# Patient Record
Sex: Female | Born: 1950 | Race: Black or African American | Hispanic: No | Marital: Married | Smoking: Never smoker
Health system: Southern US, Community
[De-identification: ages and names within clinical notes are randomized; demographics above are authoritative.]

## PROBLEM LIST (undated history)

## (undated) DIAGNOSIS — M1990 Unspecified osteoarthritis, unspecified site: Secondary | ICD-10-CM

## (undated) DIAGNOSIS — Z972 Presence of dental prosthetic device (complete) (partial): Secondary | ICD-10-CM

## (undated) DIAGNOSIS — N959 Unspecified menopausal and perimenopausal disorder: Secondary | ICD-10-CM

## (undated) DIAGNOSIS — F329 Major depressive disorder, single episode, unspecified: Secondary | ICD-10-CM

## (undated) DIAGNOSIS — E785 Hyperlipidemia, unspecified: Secondary | ICD-10-CM

## (undated) DIAGNOSIS — R7303 Prediabetes: Secondary | ICD-10-CM

## (undated) DIAGNOSIS — I10 Essential (primary) hypertension: Secondary | ICD-10-CM

## (undated) DIAGNOSIS — F32A Depression, unspecified: Secondary | ICD-10-CM

## (undated) DIAGNOSIS — K648 Other hemorrhoids: Secondary | ICD-10-CM

## (undated) DIAGNOSIS — K219 Gastro-esophageal reflux disease without esophagitis: Secondary | ICD-10-CM

## (undated) DIAGNOSIS — T7840XA Allergy, unspecified, initial encounter: Secondary | ICD-10-CM

## (undated) HISTORY — DX: Hyperlipidemia, unspecified: E78.5

## (undated) HISTORY — DX: Allergy, unspecified, initial encounter: T78.40XA

## (undated) HISTORY — DX: Gastro-esophageal reflux disease without esophagitis: K21.9

## (undated) HISTORY — DX: Major depressive disorder, single episode, unspecified: F32.9

## (undated) HISTORY — PX: CATARACT EXTRACTION, BILATERAL: SHX1313

## (undated) HISTORY — DX: Essential (primary) hypertension: I10

## (undated) HISTORY — DX: Depression, unspecified: F32.A

## (undated) HISTORY — PX: MYOMECTOMY: SHX85

## (undated) HISTORY — DX: Other hemorrhoids: K64.8

## (undated) HISTORY — DX: Unspecified osteoarthritis, unspecified site: M19.90

## (undated) HISTORY — DX: Unspecified menopausal and perimenopausal disorder: N95.9

## (undated) HISTORY — PX: ABDOMINAL HYSTERECTOMY: SHX81

## (undated) HISTORY — PX: OOPHORECTOMY: SHX86

---

## 2005-01-31 ENCOUNTER — Ambulatory Visit: Payer: Self-pay

## 2006-02-02 ENCOUNTER — Ambulatory Visit: Payer: Self-pay | Admitting: Family Medicine

## 2006-02-06 ENCOUNTER — Ambulatory Visit: Payer: Self-pay | Admitting: Gastroenterology

## 2006-02-06 LAB — HM COLONOSCOPY

## 2006-11-30 ENCOUNTER — Ambulatory Visit: Payer: Self-pay | Admitting: Obstetrics and Gynecology

## 2006-11-30 HISTORY — PX: OTHER SURGICAL HISTORY: SHX169

## 2006-12-13 ENCOUNTER — Ambulatory Visit: Payer: Self-pay | Admitting: Obstetrics and Gynecology

## 2006-12-21 ENCOUNTER — Ambulatory Visit: Payer: Self-pay | Admitting: Obstetrics and Gynecology

## 2007-02-12 ENCOUNTER — Ambulatory Visit: Payer: Self-pay | Admitting: Family Medicine

## 2008-02-27 ENCOUNTER — Ambulatory Visit: Payer: Self-pay | Admitting: Family Medicine

## 2008-12-21 LAB — HM DEXA SCAN: HM Dexa Scan: NORMAL

## 2009-03-01 ENCOUNTER — Ambulatory Visit: Payer: Self-pay | Admitting: Family Medicine

## 2009-08-31 DIAGNOSIS — R739 Hyperglycemia, unspecified: Secondary | ICD-10-CM

## 2010-03-02 ENCOUNTER — Ambulatory Visit: Payer: Self-pay | Admitting: Family Medicine

## 2011-03-06 ENCOUNTER — Ambulatory Visit: Payer: Self-pay | Admitting: Family Medicine

## 2012-03-06 ENCOUNTER — Ambulatory Visit: Payer: Self-pay | Admitting: Family Medicine

## 2013-03-13 ENCOUNTER — Ambulatory Visit: Payer: Self-pay | Admitting: Family Medicine

## 2014-01-06 LAB — LIPID PANEL
Cholesterol: 120 mg/dL (ref 0–200)
HDL: 54 mg/dL (ref 35–70)
LDL Cholesterol: 41 mg/dL
TRIGLYCERIDES: 126 mg/dL (ref 40–160)

## 2014-01-06 LAB — HEMOGLOBIN A1C: Hgb A1c MFr Bld: 6 % (ref 4.0–6.0)

## 2014-03-25 ENCOUNTER — Ambulatory Visit: Payer: Self-pay | Admitting: Family Medicine

## 2014-03-25 LAB — HM MAMMOGRAPHY: HM Mammogram: NORMAL

## 2014-11-05 ENCOUNTER — Telehealth: Payer: Self-pay

## 2014-11-05 ENCOUNTER — Encounter: Payer: Self-pay | Admitting: Family Medicine

## 2014-11-05 DIAGNOSIS — L309 Dermatitis, unspecified: Secondary | ICD-10-CM | POA: Insufficient documentation

## 2014-11-05 DIAGNOSIS — R32 Unspecified urinary incontinence: Secondary | ICD-10-CM | POA: Insufficient documentation

## 2014-11-05 DIAGNOSIS — I10 Essential (primary) hypertension: Secondary | ICD-10-CM | POA: Insufficient documentation

## 2014-11-05 DIAGNOSIS — N959 Unspecified menopausal and perimenopausal disorder: Secondary | ICD-10-CM | POA: Insufficient documentation

## 2014-11-05 DIAGNOSIS — E559 Vitamin D deficiency, unspecified: Secondary | ICD-10-CM | POA: Insufficient documentation

## 2014-11-05 DIAGNOSIS — F330 Major depressive disorder, recurrent, mild: Secondary | ICD-10-CM | POA: Insufficient documentation

## 2014-11-05 DIAGNOSIS — J302 Other seasonal allergic rhinitis: Secondary | ICD-10-CM | POA: Insufficient documentation

## 2014-11-05 DIAGNOSIS — J3089 Other allergic rhinitis: Secondary | ICD-10-CM

## 2014-11-05 DIAGNOSIS — G5600 Carpal tunnel syndrome, unspecified upper limb: Secondary | ICD-10-CM | POA: Insufficient documentation

## 2014-11-05 DIAGNOSIS — K219 Gastro-esophageal reflux disease without esophagitis: Secondary | ICD-10-CM | POA: Insufficient documentation

## 2014-11-05 DIAGNOSIS — M1990 Unspecified osteoarthritis, unspecified site: Secondary | ICD-10-CM | POA: Insufficient documentation

## 2014-11-05 DIAGNOSIS — L210 Seborrhea capitis: Secondary | ICD-10-CM | POA: Insufficient documentation

## 2014-11-05 DIAGNOSIS — E8881 Metabolic syndrome: Secondary | ICD-10-CM | POA: Insufficient documentation

## 2014-11-05 DIAGNOSIS — M6530 Trigger finger, unspecified finger: Secondary | ICD-10-CM | POA: Insufficient documentation

## 2014-11-05 DIAGNOSIS — E785 Hyperlipidemia, unspecified: Secondary | ICD-10-CM | POA: Insufficient documentation

## 2014-11-05 NOTE — Telephone Encounter (Signed)
Pt called to our call service and left message that she had an appt on Monday 7/11 and was having a lot of pain in her left side and wanted to see if we could see her today? When I got to the message it was already 3:30pm so I called left pt vm to call and make an appt tomorrow(friday, we had avuaibility open)

## 2014-11-06 ENCOUNTER — Encounter: Payer: Self-pay | Admitting: Family Medicine

## 2014-11-06 ENCOUNTER — Ambulatory Visit (INDEPENDENT_AMBULATORY_CARE_PROVIDER_SITE_OTHER): Payer: BC Managed Care – PPO | Admitting: Family Medicine

## 2014-11-06 VITALS — BP 118/74 | HR 74 | Temp 98.7°F | Resp 18 | Ht 62.0 in | Wt 218.0 lb

## 2014-11-06 DIAGNOSIS — M5442 Lumbago with sciatica, left side: Secondary | ICD-10-CM | POA: Diagnosis not present

## 2014-11-06 LAB — POCT URINALYSIS DIPSTICK
Bilirubin, UA: NEGATIVE
Blood, UA: NEGATIVE
Glucose, UA: NEGATIVE
Ketones, UA: NEGATIVE
Nitrite, UA: NEGATIVE
Protein, UA: NEGATIVE
Spec Grav, UA: 1.01
Urobilinogen, UA: 0.2
pH, UA: 5

## 2014-11-06 MED ORDER — HYDROCODONE-ACETAMINOPHEN 5-325 MG PO TABS: 2.0000 | ORAL_TABLET | Freq: Four times a day (QID) | ORAL | Status: DC | PRN

## 2014-11-06 MED ORDER — PREDNISONE 10 MG (48) PO TBPK: ORAL_TABLET | Freq: Every day | ORAL | Status: DC

## 2014-11-06 NOTE — Patient Instructions (Signed)

## 2014-11-06 NOTE — Progress Notes (Signed)
Name: Monique Baker   MRDayton Scrape: [id number]    DOB: 1950-11-27   Date:11/06/2014       Progress Note  Subjective  Chief Complaint  Chief Complaint  Patient presents with  . Back Pain    Onset 4 days ago radiates to left lower quad and leg (unknown trauma or heavy lifiting)    HPI  Acute low back pain with sciatica: she states symptoms started 4 days ago, after she spent the day cleaning her house.  She states pain is described as an aching pain, like a toothache on her back that radiates to left lower quadrant and outer left thigh. Pain can be severe at times and kept her awake last night. She has been taking Aleve, heating pad and topical cream, with temporary relieve of symptoms. No bladder or bowel incontinence, no saddle anesthesia, no weakness , mild antalgic gait.  Pain during movement of her back. Needs to shift positions to get comfortable.    Patient Active Problem List   Diagnosis Date Noted  . Benign essential HTN 11/05/2014  . Carpal tunnel syndrome 11/05/2014  . Osteoarthritis 11/05/2014  . Dermatitis, eczematoid 11/05/2014  . Clinical depression 11/05/2014  . Dyslipidemia 11/05/2014  . Gastro-esophageal reflux disease without esophagitis 11/05/2014  . Extreme obesity 11/05/2014  . Dysmetabolic syndrome 11/05/2014  . Menopausal and perimenopausal disorder 11/05/2014  . Perennial allergic rhinitis with seasonal variation 11/05/2014  . Seborrhea capitis 11/05/2014  . Acquired trigger finger 11/05/2014  . Urinary incontinence in female 11/05/2014  . Vitamin D deficiency 11/05/2014  . Blood glucose elevated 08/31/2009    History  Substance Use Topics  . Smoking status: Never Smoker   . Smokeless tobacco: Never Used  . Alcohol Use: No     Current outpatient prescriptions:  .  acetaminophen (TYLENOL) 500 MG tablet, Take by mouth., Disp: , Rfl:  .  amLODipine (NORVASC) 5 MG tablet, Take by mouth., Disp: , Rfl:  .  atorvastatin (LIPITOR) 40 MG tablet, Take by mouth.,  Disp: , Rfl:  .  Cyanocobalamin (B-12) 1000 MCG SUBL, Place under the tongue., Disp: , Rfl:  .  Glucosamine-Chondroitin ([employer] GLUCOSAMINE-CHONDROITIN) 500-400 MG CAPS, Take by mouth., Disp: , Rfl:  .  meloxicam (MOBIC) 15 MG tablet, Take by mouth., Disp: , Rfl:  .  metFORMIN (GLUCOPHAGE) 500 MG tablet, Take by mouth., Disp: , Rfl:  .  nadolol (CORGARD) 20 MG tablet, Take by mouth., Disp: , Rfl:  .  triamcinolone cream (KENALOG) 0.1 %, TRIAMCINOLONE ACETONIDE, 0.1% (External Cream)  1 Cream apply to nuchal area bid for 30 days  Quantity: 60;  Refills: 2   Ordered :30-Jan-2013  Alba CorySOWLES, Lauranne Beyersdorf MD;  Started 30-Jan-2013 Active, Disp: , Rfl:  .  valsartan-hydrochlorothiazide (DIOVAN-HCT) 320-25 MG per tablet, Take by mouth., Disp: , Rfl:  .  HYDROcodone-acetaminophen (NORCO/VICODIN) 5-325 MG per tablet, Take 2 tablets by mouth every 6 (six) hours as needed for moderate pain., Disp: 20 tablet, Rfl: 0 .  predniSONE (STERAPRED UNI-PAK 48 TAB) 10 MG (48) TBPK tablet, Take by mouth daily. Do not take meloxicam while taking this, Disp: 48 tablet, Rfl: 0  No Known Allergies  ROS  Constitutional: Negative for fever or weight change.  Respiratory: Negative for cough and shortness of breath.   Cardiovascular: Negative for chest pain or palpitations.  Gastrointestinal: Negative for bowel changes.  Musculoskeletal: Negative for gait problem or joint swelling.  Skin: Negative for rash.  Neurological: Negative for dizziness or headache.  No other specific complaints  in a complete review of systems (except as listed in HPI above).  Objective  Filed Vitals:   11/06/14 1027  BP: 118/74  Pulse: 74  Temp: 98.7 F (37.1 C)  TempSrc: Oral  Resp: 18  Height:  (1.575 m)  Weight: 218 lb (98.884 kg)  SpO2: 97%    Body mass index is 39.86 kg/(m^2).    Physical Exam  Constitutional: Patient appears well-developed and well-nourished. No distress. Obese Eyes:  No scleral icterus. PERL Neck: Normal  range of motion. Neck supple. Cardiovascular: Normal rate, regular rhythm and normal heart sounds.  No murmur heard. No BLE edema. Pulmonary/Chest: Effort normal and breath sounds normal. No respiratory distress. Abdominal: Soft.  There is no tenderness. Psychiatric: Patient has a normal mood and affect. behavior is normal. Judgment and thought content normal. Muscular Skeletal/Neuro: neg straight leg raise, patella reflexes normal bilaterally, normal flexion of spine, pain with extension on left lateral bending. Normal sensation lower extremities, normal rom of both hips  Recent Results (from the past 2160 hour(s))  POCT urinalysis dipstick     Status: Abnormal   Collection Time: 11/06/14 10:46 AM  Result Value Ref Range   Color, UA yeallow    Clarity, UA clear    Glucose, UA neg    Bilirubin, UA neg    Ketones, UA neg    Spec Grav, UA 1.010    Blood, UA neg    pH, UA 5.0    Protein, UA neg    Urobilinogen, UA 0.2    Nitrite, UA neg    Leukocytes, UA large (3+) (A) Negative   Depression screen [facility] 2/9 11/06/2014  Decreased Interest 0  Down, Depressed, Hopeless 0  PHQ - 2 Score 0    Fall Risk: Fall Risk  11/06/2014  Falls in the past year? No       Assessment & Plan  1. Left-sided low back pain with left-sided sciatica We will try prednisone taper and hydrocodone, avoid nsaid's while taking prednisone ( also explained that Aleve/Motrin/Meloxicam are the same class of drugs and should not be taken with Meloxicam or prednisone).  - POCT urinalysis dipstick - predniSONE (STERAPRED UNI-PAK 48 TAB) 10 MG (48) TBPK tablet; Take by mouth daily. Do not take meloxicam while taking this  Dispense: 48 tablet; Refill: 0 - HYDROcodone-acetaminophen (NORCO/VICODIN) 5-325 MG per tablet; Take 2 tablets by mouth every 6 (six) hours as needed for moderate pain.  Dispense: 20 tablet; Refill: 0

## 2014-11-09 ENCOUNTER — Telehealth: Payer: Self-pay | Admitting: Family Medicine

## 2014-11-09 ENCOUNTER — Encounter: Payer: Self-pay | Admitting: Family Medicine

## 2014-11-09 ENCOUNTER — Ambulatory Visit (INDEPENDENT_AMBULATORY_CARE_PROVIDER_SITE_OTHER): Payer: BLUE CROSS/BLUE SHIELD | Admitting: Family Medicine

## 2014-11-09 VITALS — BP 136/84 | HR 69 | Temp 98.3°F | Resp 16 | Ht 62.0 in | Wt 219.8 lb

## 2014-11-09 DIAGNOSIS — I10 Essential (primary) hypertension: Secondary | ICD-10-CM

## 2014-11-09 DIAGNOSIS — E785 Hyperlipidemia, unspecified: Secondary | ICD-10-CM

## 2014-11-09 DIAGNOSIS — R739 Hyperglycemia, unspecified: Secondary | ICD-10-CM | POA: Diagnosis not present

## 2014-11-09 DIAGNOSIS — E538 Deficiency of other specified B group vitamins: Secondary | ICD-10-CM | POA: Diagnosis not present

## 2014-11-09 DIAGNOSIS — M5442 Lumbago with sciatica, left side: Secondary | ICD-10-CM | POA: Diagnosis not present

## 2014-11-09 MED ORDER — HYDROCODONE-ACETAMINOPHEN 5-325 MG PO TABS: 2.0000 | ORAL_TABLET | Freq: Four times a day (QID) | ORAL | Status: DC | PRN

## 2014-11-09 MED ORDER — AMLODIPINE-VALSARTAN-HCTZ 10-160-12.5 MG PO TABS: 1.0000 | ORAL_TABLET | Freq: Every day | ORAL | Status: DC

## 2014-11-09 MED ORDER — AMLODIPINE-VALSARTAN-HCTZ 5-160-12.5 MG PO TABS: 1.0000 | ORAL_TABLET | Freq: Every day | ORAL | Status: DC

## 2014-11-09 NOTE — Progress Notes (Signed)
Name: Monique Baker   MRN: [id number]    DOB: 08-18-1950   Date:11/09/2014       Progress Note  Subjective  Chief Complaint  Chief Complaint  Patient presents with  . Hypertension  . Hyperlipidemia    HPI  HTN: taking medication, takes half dose of valsarta-HCTZ, also takes Amlodipine daily, not side effects, denies chest pain or SOB, no edema.  Hyperlipidemia: taking Atorvastatin and denies side effects, she is compliant with medication   Acute low back pain with radiculitis: doing well, started prednisone last week and pain is down to 3/10, still mild down going down left thigh, but improved. Denies side effects of prednisone, still taking hydrocodone prn and would like a refill.   Prediabetes: she is not exercising now, because of back pain, but tries to walk on a regular basis.   Patient Active Problem List   Diagnosis Date Noted  . Benign essential HTN 11/05/2014  . Carpal tunnel syndrome 11/05/2014  . Osteoarthritis 11/05/2014  . Dermatitis, eczematoid 11/05/2014  . Clinical depression 11/05/2014  . Dyslipidemia 11/05/2014  . Gastro-esophageal reflux disease without esophagitis 11/05/2014  . Extreme obesity 11/05/2014  . Dysmetabolic syndrome 11/05/2014  . Menopausal and perimenopausal disorder 11/05/2014  . Perennial allergic rhinitis with seasonal variation 11/05/2014  . Seborrhea capitis 11/05/2014  . Acquired trigger finger 11/05/2014  . Urinary incontinence in female 11/05/2014  . Vitamin D deficiency 11/05/2014  . Blood glucose elevated 08/31/2009    Past Surgical History  Procedure Laterality Date  . Vulva cyst  11/2006  . Abdominal hysterectomy    . Cesarean section    . Myomectomy    . Trigger thumb Right     Family History  Problem Relation Age of Onset  . Dementia Mother   . Diabetes Father   . Cancer Daughter     Fibroid Tumors    History   Social History  . Marital Status: Married    Spouse Name: N/A  . Number of Children: N/A  .  Years of Education: N/A   Occupational History  . Not on file.   Social History Main Topics  . Smoking status: Never Smoker   . Smokeless tobacco: Never Used  . Alcohol Use: No  . Drug Use: No  . Sexual Activity:    Partners: Male   Other Topics Concern  . Not on file   Social History Narrative     Current outpatient prescriptions:  .  acetaminophen (TYLENOL) 500 MG tablet, Take by mouth., Disp: , Rfl:  .  amLODipine (NORVASC) 5 MG tablet, Take by mouth., Disp: , Rfl:  .  atorvastatin (LIPITOR) 40 MG tablet, Take by mouth., Disp: , Rfl:  .  Cyanocobalamin (B-12) 1000 MCG SUBL, Place under the tongue., Disp: , Rfl:  .  Glucosamine-Chondroitin ([employer] GLUCOSAMINE-CHONDROITIN) 500-400 MG CAPS, Take by mouth., Disp: , Rfl:  .  HYDROcodone-acetaminophen (NORCO/VICODIN) 5-325 MG per tablet, Take 2 tablets by mouth every 6 (six) hours as needed for moderate pain., Disp: 20 tablet, Rfl: 0 .  meloxicam (MOBIC) 15 MG tablet, Take by mouth., Disp: , Rfl:  .  metFORMIN (GLUCOPHAGE) 500 MG tablet, Take by mouth., Disp: , Rfl:  .  nadolol (CORGARD) 20 MG tablet, Take by mouth., Disp: , Rfl:  .  predniSONE (STERAPRED UNI-PAK 48 TAB) 10 MG (48) TBPK tablet, Take by mouth daily. Do not take meloxicam while taking this, Disp: 48 tablet, Rfl: 0 .  triamcinolone cream (KENALOG) 0.1 %, TRIAMCINOLONE ACETONIDE,  0.1% (External Cream)  1 Cream apply to nuchal area bid for 30 days  Quantity: 60;  Refills: 2   Ordered :30-Jan-2013  Alba Cory MD;  Started 30-Jan-2013 Active, Disp: , Rfl:  .  valsartan-hydrochlorothiazide (DIOVAN-HCT) 320-25 MG per tablet, Take by mouth., Disp: , Rfl:   No Known Allergies   ROS  Constitutional: Negative for fever or weight change.  Respiratory: Negative for cough and shortness of breath.   Cardiovascular: Negative for chest pain or palpitations.  Gastrointestinal: Negative for abdominal pain, no bowel changes, except for a little constipation, since she has not been  walking as much because of back pain.  Musculoskeletal: Negative for gait problem or joint swelling.  Skin: Negative for rash.  Neurological: Negative for dizziness or headache.  No other specific complaints in a complete review of systems (except as listed in HPI above). Objective  Filed Vitals:   11/09/14 0920  BP: 136/84  Pulse: 69  Temp: 98.3 F (36.8 C)  TempSrc: Oral  Resp: 16  Height:  (1.575 m)  Weight: 219 lb 12.8 oz (99.701 kg)  SpO2: 96%    Body mass index is 40.19 kg/(m^2).  Physical Exam  Constitutional: Patient appears well-developed and well-nourished. No distress.  Eyes:  No scleral icterus.  Neck: Normal range of motion. Neck supple. Cardiovascular: Normal rate, regular rhythm and normal heart sounds.  No murmur heard. No BLE edema. Pulmonary/Chest: Effort normal and breath sounds normal. No respiratory distress. Abdominal: Soft.  There is no tenderness. Psychiatric: Patient has a normal mood and affect. behavior is normal. Judgment and thought content normal. Muscular Skeletal : no pain during palpation of lumbar spine, neg straight leg raise - grinding with left knee extension  Recent Results (from the past 2160 hour(s))  POCT urinalysis dipstick     Status: Abnormal   Collection Time: 11/06/14 10:46 AM  Result Value Ref Range   Color, UA yeallow    Clarity, UA clear    Glucose, UA neg    Bilirubin, UA neg    Ketones, UA neg    Spec Grav, UA 1.010    Blood, UA neg    pH, UA 5.0    Protein, UA neg    Urobilinogen, UA 0.2    Nitrite, UA neg    Leukocytes, UA large (3+) (A) Negative    PHQ2/9: Depression screen [facility] 2/9 11/06/2014  Decreased Interest 0  Down, Depressed, Hopeless 0  PHQ - 2 Score 0     Fall Risk: Fall Risk  11/06/2014  Falls in the past year? No    Assessment & Plan  1. Benign essential HTN  - Amlodipine-Valsartan-HCTZ 5-160-12.5 MG TABS; Take 1 tablet by mouth daily.  Dispense: 90 tablet; Refill: 1 - Comprehensive  Metabolic Panel (CMET)  2. Dyslipidemia  - Lipid Profile   3. Left-sided low back pain with left-sided sciatica  - HYDROcodone-acetaminophen (NORCO/VICODIN) 5-325 MG per tablet; Take 2 tablets by mouth every 6 (six) hours as needed for moderate pain.  Dispense: 20 tablet; Refill: 0  4. Hyperglycemia  Continue Metformin and resume physical activity when back is better - HgB A1c  5. B12 deficiency  - Vitamin B12

## 2014-11-09 NOTE — Telephone Encounter (Signed)
Please call pharmacy to cancel order of Exforge 10/160/12.5 . Thank you

## 2014-11-10 LAB — COMPREHENSIVE METABOLIC PANEL
ALK PHOS: 76 IU/L (ref 39–117)
ALT: 17 IU/L (ref 0–32)
AST: 17 IU/L (ref 0–40)
Albumin/Globulin Ratio: 1.6 (ref 1.1–2.5)
Albumin: 4.6 g/dL (ref 3.6–4.8)
BUN / CREAT RATIO: 25 (ref 11–26)
BUN: 20 mg/dL (ref 8–27)
Bilirubin Total: 0.6 mg/dL (ref 0.0–1.2)
CO2: 27 mmol/L (ref 18–29)
Calcium: 10.2 mg/dL (ref 8.7–10.3)
Chloride: 99 mmol/L (ref 97–108)
Creatinine, Ser: 0.79 mg/dL (ref 0.57–1.00)
GFR calc Af Amer: 92 mL/min/{1.73_m2} (ref >59)
GFR calc non Af Amer: 80 mL/min/{1.73_m2} (ref >59)
GLOBULIN, TOTAL: 2.8 g/dL (ref 1.5–4.5)
Glucose: 98 mg/dL (ref 65–99)
Potassium: 4.4 mmol/L (ref 3.5–5.2)
SODIUM: 142 mmol/L (ref 134–144)
TOTAL PROTEIN: 7.4 g/dL (ref 6.0–8.5)

## 2014-11-10 LAB — HEMOGLOBIN A1C
Est. average glucose Bld gHb Est-mCnc: 128 mg/dL
HEMOGLOBIN A1C: 6.1 % — AB (ref 4.8–5.6)

## 2014-11-10 LAB — LIPID PANEL
Chol/HDL Ratio: 2.2 ratio units (ref 0.0–4.4)
Cholesterol, Total: 143 mg/dL (ref 100–199)
HDL: 64 mg/dL (ref >39)
LDL Calculated: 63 mg/dL (ref 0–99)
TRIGLYCERIDES: 82 mg/dL (ref 0–149)
VLDL Cholesterol Cal: 16 mg/dL (ref 5–40)

## 2014-11-10 LAB — VITAMIN B12: Vitamin B-12: 1548 pg/mL — ABNORMAL HIGH (ref 211–946)

## 2014-11-11 NOTE — Progress Notes (Signed)
Patient notified

## 2014-12-15 HISTORY — PX: OTHER SURGICAL HISTORY: SHX169

## 2014-12-28 ENCOUNTER — Encounter: Payer: Self-pay | Admitting: Family Medicine

## 2015-01-11 ENCOUNTER — Encounter: Payer: Self-pay | Admitting: Family Medicine

## 2015-01-11 ENCOUNTER — Ambulatory Visit
Admission: RE | Admit: 2015-01-11 | Discharge: 2015-01-11 | Disposition: A | Discharge status: Home / Self Care | Payer: BC Managed Care – PPO | Source: Ambulatory Visit | Attending: Family Medicine | Admitting: Family Medicine

## 2015-01-11 ENCOUNTER — Ambulatory Visit (INDEPENDENT_AMBULATORY_CARE_PROVIDER_SITE_OTHER): Payer: BC Managed Care – PPO | Admitting: Family Medicine

## 2015-01-11 VITALS — BP 134/82 | HR 78 | Temp 98.8°F | Resp 16 | Ht 62.0 in | Wt 219.1 lb

## 2015-01-11 DIAGNOSIS — M2041 Other hammer toe(s) (acquired), right foot: Secondary | ICD-10-CM

## 2015-01-11 DIAGNOSIS — M25551 Pain in right hip: Secondary | ICD-10-CM

## 2015-01-11 DIAGNOSIS — Z23 Encounter for immunization: Secondary | ICD-10-CM | POA: Diagnosis not present

## 2015-01-11 DIAGNOSIS — R05 Cough: Secondary | ICD-10-CM

## 2015-01-11 DIAGNOSIS — R059 Cough, unspecified: Secondary | ICD-10-CM

## 2015-01-11 DIAGNOSIS — Z0000 Encounter for general adult medical examination without abnormal findings: Secondary | ICD-10-CM | POA: Diagnosis not present

## 2015-01-11 DIAGNOSIS — M2042 Other hammer toe(s) (acquired), left foot: Secondary | ICD-10-CM

## 2015-01-11 DIAGNOSIS — J3089 Other allergic rhinitis: Secondary | ICD-10-CM

## 2015-01-11 DIAGNOSIS — M6530 Trigger finger, unspecified finger: Secondary | ICD-10-CM

## 2015-01-11 DIAGNOSIS — R5383 Other fatigue: Secondary | ICD-10-CM | POA: Diagnosis not present

## 2015-01-11 DIAGNOSIS — J302 Other seasonal allergic rhinitis: Secondary | ICD-10-CM

## 2015-01-11 DIAGNOSIS — J309 Allergic rhinitis, unspecified: Secondary | ICD-10-CM | POA: Diagnosis not present

## 2015-01-11 DIAGNOSIS — Z01419 Encounter for gynecological examination (general) (routine) without abnormal findings: Secondary | ICD-10-CM

## 2015-01-11 DIAGNOSIS — Z1239 Encounter for other screening for malignant neoplasm of breast: Secondary | ICD-10-CM

## 2015-01-11 MED ORDER — BENZONATATE 100 MG PO CAPS: 100.0000 mg | ORAL_CAPSULE | Freq: Two times a day (BID) | ORAL | Status: DC | PRN

## 2015-01-11 MED ORDER — LORATADINE 10 MG PO TABS: 10.0000 mg | ORAL_TABLET | Freq: Every day | ORAL | Status: DC

## 2015-01-11 MED ORDER — BENZONATATE 100 MG PO CAPS: 100.0000 mg | ORAL_CAPSULE | Freq: Three times a day (TID) | ORAL | Status: DC

## 2015-01-11 MED ORDER — FLUTICASONE PROPIONATE 50 MCG/ACT NA SUSP: 2.0000 | Freq: Every day | NASAL | Status: DC

## 2015-01-11 NOTE — Progress Notes (Signed)
Name: Monique Baker   MRN: [id number]    DOB: 04-19-1951   Date:01/11/2015       Progress Note  Subjective  Chief Complaint  Chief Complaint  Patient presents with  . Annual Exam  . Hip Pain    right side pt states painful and limping, onset 1 year  . Fatigue    no energy onset for several months ( still watching grandson)  . Toe Pain    left foot second toe painful onset 3-weeks  . 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  Colonoscopy due next year, mammogram up to date, and pap smear not needed, s/p hysterectomy  Toe Pain: second toe left foot, has a hammer toe that is very tender during extension, also has pain on metatarsal phalangeal joint, symptoms going on for about 2 months, she has not been taking Meloxicam, advised to resume medication with food daily and call back for referral to Podiatrist if no improvement  Pain right groin: she has noticed pain on right groin for the past year, getting worse, pain getting up, sometimes radiates to right knee. Pain is described as dull, aching, daily, at this time is 5/10. Pain is aggravated by movement, and when trying to get up from a chair.   Cough: symptoms started one week, no cold symptoms, but has she has allergies. Cough is dry, sometimes when she is talking. No fever, no wheezing, no SOB. She has Perennial Allergies with seasonal variation and has been out of her medications for months.   Patient Active Problem List   Diagnosis Date Noted  . Benign essential HTN 11/05/2014  . Carpal tunnel syndrome 11/05/2014  . Osteoarthritis 11/05/2014  . Dermatitis, eczematoid 11/05/2014  . Clinical depression 11/05/2014  . Dyslipidemia 11/05/2014  . Gastro-esophageal reflux disease without esophagitis 11/05/2014  . Extreme obesity 11/05/2014  . Dysmetabolic  syndrome 11/05/2014  . Menopausal and perimenopausal disorder 11/05/2014  . Perennial allergic rhinitis with seasonal variation 11/05/2014  . Seborrhea capitis 11/05/2014  . Acquired trigger finger 11/05/2014  . Urinary incontinence in female 11/05/2014  . Vitamin D deficiency 11/05/2014  . Blood glucose elevated 08/31/2009    Past Surgical History  Procedure Laterality Date  . Vulva cyst  11/2006  . Abdominal hysterectomy    . Cesarean section    . Myomectomy    . Trigger finger Right 12/15/2014    Family History  Problem Relation Age of Onset  . Dementia Mother   . Diabetes Father   . Cancer Daughter     Fibroid Tumors    Social History   Social History  . Marital Status: Married    Spouse Name: N/A  . Number of Children: N/A  . Years of Education: N/A   Occupational History  . Not on file.   Social History Main Topics  . Smoking status: Never Smoker   . Smokeless tobacco: Never Used  . Alcohol Use: No  . Drug Use: No  . Sexual Activity:    Partners: Male   Other Topics Concern  . Not on file   Social History Narrative     Current outpatient prescriptions:  .  acetaminophen (TYLENOL) 500 MG tablet, Take by mouth., Disp: , Rfl:  .  Amlodipine-Valsartan-HCTZ 5-160-12.5 MG TABS, Take  1 tablet by mouth daily., Disp: 90 tablet, Rfl: 1 .  atorvastatin (LIPITOR) 40 MG tablet, Take by mouth., Disp: , Rfl:  .  benzonatate (TESSALON) 100 MG capsule, Take 1 capsule (100 mg total) by mouth 2 (two) times daily as needed for cough., Disp: 20 capsule, Rfl: 0 .  Cyanocobalamin (B-12) 1000 MCG SUBL, Place under the tongue., Disp: , Rfl:  .  fluticasone (FLONASE) 50 MCG/ACT nasal spray, Place 2 sprays into both nostrils daily., Disp: 48 g, Rfl: 0 .  Glucosamine-Chondroitin ([employer] GLUCOSAMINE-CHONDROITIN) 500-400 MG CAPS, Take by mouth., Disp: , Rfl:  .  loratadine (CLARITIN) 10 MG tablet, Take 1 tablet (10 mg total) by mouth daily., Disp: 90 tablet, Rfl: 1 .  meloxicam  (MOBIC) 15 MG tablet, Take by mouth., Disp: , Rfl:  .  metFORMIN (GLUCOPHAGE) 500 MG tablet, Take by mouth., Disp: , Rfl:  .  nadolol (CORGARD) 20 MG tablet, Take by mouth., Disp: , Rfl:  .  traMADol (ULTRAM) 50 MG tablet, Take 50 mg by mouth every 6 (six) hours as needed. for pain, Disp: , Rfl: 0 .  triamcinolone cream (KENALOG) 0.1 %, TRIAMCINOLONE ACETONIDE, 0.1% (External Cream)  1 Cream apply to nuchal area bid for 30 days  Quantity: 60;  Refills: 2   Ordered :30-Jan-2013  Alba Cory MD;  Started 30-Jan-2013 Active, Disp: , Rfl:   No Known Allergies   ROS  Constitutional: Negative for fever or weight change.  Respiratory: Positive  for cough no  shortness of breath.   Cardiovascular: Negative for chest pain or palpitations.  Gastrointestinal: Negative for abdominal pain, no bowel changes.  Musculoskeletal:Positive  for gait problem or joint swelling.  Skin: Negative for rash.  Neurological: Negative for dizziness or headache.  No other specific complaints in a complete review of systems (except as listed in HPI above).  Objective  Filed Vitals:   01/11/15 0824  BP: 134/82  Pulse: 78  Temp: 98.8 F (37.1 C)  TempSrc: Oral  Resp: 16  Height: 5\' 2"  (1.575 m)  Weight: 219 lb 1.6 oz (99.383 kg)  SpO2: 97%    Body mass index is 40.06 kg/(m^2).  Physical Exam  Constitutional: Patient appears well-developed and obese.No distress.  HENT: Head: Normocephalic and atraumatic. Ears: B TMs ok, no erythema or effusion; Nose: Nose normal. Mouth/Throat: Oropharynx is clear and moist. No oropharyngeal exudate.  Eyes: Conjunctivae and EOM are normal. Pupils are equal, round, and reactive to light. No scleral icterus.  Neck: Normal range of motion. Neck supple. No JVD present. No thyromegaly present.  Cardiovascular: Normal rate, regular rhythm and normal heart sounds.  No murmur heard. No BLE edema. Pulmonary/Chest: Effort normal and breath sounds normal. No respiratory  distress. Abdominal: Soft. Bowel sounds are normal, no distension. There is no tenderness. no masses Breast: no lumps or masses, no nipple discharge or rashes FEMALE GENITALIA:  External genitalia normal, she has a rectocele External urethra normal RECTAL: not done Musculoskeletal: Normal range of motion, no joint effusions. No gross deformities Pain during internal rotation of right hip, hammer toes both second toes, but tender with extension of left second toe, also has pain on metatarsal phalangeal joint of left foot. No pain during back exam Neurological: he is alert and oriented to person, place, and time. No cranial nerve deficit. Coordination, balance, strength, speech and gait are normal.  Skin: Skin is warm and dry. No rash noted. No erythema.  Psychiatric: Patient has a normal mood and affect. behavior is normal.  Judgment and thought content normal.   Recent Results (from the past 2160 hour(s))  POCT urinalysis dipstick     Status: Abnormal   Collection Time: 11/06/14 10:46 AM  Result Value Ref Range   Color, UA yeallow    Clarity, UA clear    Glucose, UA neg    Bilirubin, UA neg    Ketones, UA neg    Spec Grav, UA 1.010    Blood, UA neg    pH, UA 5.0    Protein, UA neg    Urobilinogen, UA 0.2    Nitrite, UA neg    Leukocytes, UA large (3+) (A) Negative  HgB A1c     Status: Abnormal   Collection Time: 11/09/14 10:11 AM  Result Value Ref Range   Hgb A1c MFr Bld 6.1 (H) 4.8 - 5.6 %    Comment:          Pre-diabetes: 5.7 - 6.4          Diabetes: >6.4          Glycemic control for adults with diabetes: <7.0    Est. average glucose Bld gHb Est-mCnc 128 mg/dL  Lipid Profile     Status: None   Collection Time: 11/09/14 10:11 AM  Result Value Ref Range   Cholesterol, Total 143 100 - 199 mg/dL   Triglycerides 82 0 - 149 mg/dL   HDL 64 >16 mg/dL    Comment: According to ATP-III Guidelines, HDL-C >59 mg/dL is considered a negative risk factor for CHD.    VLDL Cholesterol  Cal 16 5 - 40 mg/dL   LDL Calculated 63 0 - 99 mg/dL   Chol/HDL Ratio 2.2 0.0 - 4.4 ratio units    Comment:                                   T. Chol/HDL Ratio                                             Men  Women                               1/2 Avg.Risk  3.4    3.3                                   Avg.Risk  5.0    4.4                                2X Avg.Risk  9.6    7.1                                3X Avg.Risk 23.4   11.0   Comprehensive Metabolic Panel (CMET)     Status: None   Collection Time: 11/09/14 10:11 AM  Result Value Ref Range   Glucose 98 65 - 99 mg/dL   BUN 20 8 - 27 mg/dL   Creatinine, Ser 1.09 0.57 - 1.00 mg/dL   GFR calc non Af Amer 80 >59 mL/min/1.73   GFR calc Af  Amer 92 >59 mL/min/1.73   BUN/Creatinine Ratio 25 11 - 26   Sodium 142 134 - 144 mmol/L   Potassium 4.4 3.5 - 5.2 mmol/L   Chloride 99 97 - 108 mmol/L   CO2 27 18 - 29 mmol/L   Calcium 10.2 8.7 - 10.3 mg/dL   Total Protein 7.4 6.0 - 8.5 g/dL   Albumin 4.6 3.6 - 4.8 g/dL   Globulin, Total 2.8 1.5 - 4.5 g/dL   Albumin/Globulin Ratio 1.6 1.1 - 2.5   Bilirubin Total 0.6 0.0 - 1.2 mg/dL   Alkaline Phosphatase 76 39 - 117 IU/L   AST 17 0 - 40 IU/L   ALT 17 0 - 32 IU/L  Vitamin B12     Status: Abnormal   Collection Time: 11/09/14 10:11 AM  Result Value Ref Range   Vitamin B-12 1548 (H) 211 - 946 pg/mL     PHQ2/9: Depression screen [facility] 2/9 01/11/2015 11/06/2014  Decreased Interest 0 0  Down, Depressed, Hopeless 1 0  PHQ - 2 Score 1 0     Fall Risk: Fall Risk  01/11/2015 11/06/2014  Falls in the past year? No No     Functional Status Survey: Is the patient deaf or have difficulty hearing?: No Does the patient have difficulty seeing, even when wearing glasses/contacts?: Yes (glasses) Does the patient have difficulty concentrating, remembering, or making decisions?: No Does the patient have difficulty walking or climbing stairs?: No Does the patient have difficulty dressing or bathing?:  No Does the patient have difficulty doing errands alone such as visiting a doctor's office or shopping?: No    Assessment & Plan  1. Well woman exam Discussed importance of 150 minutes of physical activity weekly, eat two servings of fish weekly, eat one serving of tree nuts ( cashews, pistachios, pecans, almonds.Marland Kitchen) every other day, eat 6 servings of fruit/vegetables daily and drink plenty of water and avoid sweet beverages.   2. Needs flu shot  - Flu Vaccine QUAD 36+ mos PF IM (Fluarix & Fluzone Quad PF)  3. Right hip pain Take Meloxican, get x-ray - if it shows Osteoarthritis we will refer her to PT , and if not better refer to Ortho - seen by Dr. Hyacinth Meeker in the past for other Ortho problems - DG Arthro Hip Right; Future  4. 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 Cough Likely allergy related - benzonatate (TESSALON) 100 MG capsule; Take 1 capsule (100 mg total) by mouth 2 (two) times daily as needed for cough.  Dispense: 20 capsule; Refill: 0  7. Perennial allergic rhinitis with seasonal variation Resume medication  - fluticasone (FLONASE) 50 MCG/ACT nasal spray; Place 2 sprays into both nostrils daily.  Dispense: 48 g; Refill: 0 - loratadine (CLARITIN) 10 MG tablet; Take 1 tablet (10 mg total) by mouth daily.  Dispense: 90 tablet; Refill: 1  8. Breast cancer screening  - MM Digital Screening; Future

## 2015-01-11 NOTE — Patient Instructions (Signed)
  Monique Baker , Thank you for taking time to come for your Wellness Visit. I appreciate your ongoing commitment to your health goals. Please review the following plan we discussed and let me know if I can assist you in the future.   These are the goals we discussed: - start walking for 30 minutes five days week - focus on losing weight to improve hip pain   This is a list of the screening recommended for you and due dates:  Health Maintenance  Topic Date Due  . HIV Screening  05/01/2019*  . Flu Shot  11/30/2015  . Colon Cancer Screening  02/07/2016  . Mammogram  03/25/2016  . Pap Smear  11/05/2017  . Tetanus Vaccine  05/10/2020  . Shingles Vaccine  Completed  .  Hepatitis C: One time screening is recommended by Center for Disease Control  (CDC) for  adults born from [birth year] through 1965.   Completed  *Topic was postponed. The date shown is not the original due date.

## 2015-01-12 ENCOUNTER — Telehealth: Payer: Self-pay

## 2015-01-12 DIAGNOSIS — R9389 Abnormal findings on diagnostic imaging of other specified body structures: Secondary | ICD-10-CM

## 2015-01-12 DIAGNOSIS — M25551 Pain in right hip: Secondary | ICD-10-CM

## 2015-01-12 NOTE — Telephone Encounter (Signed)
Pt would like to go ahead and get referral to Ortho Dr. Hyacinth Meeker

## 2015-01-19 DIAGNOSIS — M47816 Spondylosis without myelopathy or radiculopathy, lumbar region: Secondary | ICD-10-CM | POA: Insufficient documentation

## 2015-02-10 ENCOUNTER — Other Ambulatory Visit: Payer: Self-pay | Admitting: Family Medicine

## 2015-02-19 ENCOUNTER — Telehealth: Payer: Self-pay | Admitting: Family Medicine

## 2015-02-19 NOTE — Telephone Encounter (Signed)
Patient notified of what she can take by phone at 02/19/15 at 12:01 p.m. TJ

## 2015-02-19 NOTE — Telephone Encounter (Signed)
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 States Dr Carlynn PurlSowles had told her not to take a certain medication while taking the Meloxicam but is not able to remember which one. Please return patient call to discuss this

## 2015-02-19 NOTE — Telephone Encounter (Signed)
She can't take otc Aleve, Motrin, or other nsaid's.  She can take Tramadol and Tylenol with Meloxicam

## 2015-03-15 ENCOUNTER — Encounter: Payer: Self-pay | Admitting: Family Medicine

## 2015-03-15 ENCOUNTER — Ambulatory Visit (INDEPENDENT_AMBULATORY_CARE_PROVIDER_SITE_OTHER): Payer: BC Managed Care – PPO | Admitting: Family Medicine

## 2015-03-15 VITALS — BP 132/86 | HR 80 | Temp 98.2°F | Resp 16 | Ht 62.0 in | Wt 220.2 lb

## 2015-03-15 DIAGNOSIS — E8881 Metabolic syndrome: Secondary | ICD-10-CM | POA: Diagnosis not present

## 2015-03-15 DIAGNOSIS — M1612 Unilateral primary osteoarthritis, left hip: Secondary | ICD-10-CM | POA: Diagnosis not present

## 2015-03-15 DIAGNOSIS — E785 Hyperlipidemia, unspecified: Secondary | ICD-10-CM

## 2015-03-15 DIAGNOSIS — M2042 Other hammer toe(s) (acquired), left foot: Secondary | ICD-10-CM

## 2015-03-15 DIAGNOSIS — Z9889 Other specified postprocedural states: Secondary | ICD-10-CM

## 2015-03-15 DIAGNOSIS — Z8739 Personal history of other diseases of the musculoskeletal system and connective tissue: Secondary | ICD-10-CM | POA: Insufficient documentation

## 2015-03-15 DIAGNOSIS — M2041 Other hammer toe(s) (acquired), right foot: Secondary | ICD-10-CM | POA: Diagnosis not present

## 2015-03-15 DIAGNOSIS — I10 Essential (primary) hypertension: Secondary | ICD-10-CM | POA: Diagnosis not present

## 2015-03-15 LAB — POCT UA - MICROALBUMIN: Microalbumin Ur, POC: 20 mg/L

## 2015-03-15 LAB — POCT GLYCOSYLATED HEMOGLOBIN (HGB A1C): Hemoglobin A1C: 5.8

## 2015-03-15 MED ORDER — AMLODIPINE-VALSARTAN-HCTZ 5-160-12.5 MG PO TABS: 1.0000 | ORAL_TABLET | Freq: Every day | ORAL | Status: DC

## 2015-03-15 MED ORDER — TRAMADOL HCL 50 MG PO TABS: 50.0000 mg | ORAL_TABLET | Freq: Four times a day (QID) | ORAL | Status: DC | PRN

## 2015-03-15 NOTE — Progress Notes (Signed)
Name: Monique Baker   MRN: [id number]    DOB: 11/14/1950   Date:03/15/2015       Progress Note  Subjective  Chief Complaint  Chief Complaint  Patient presents with  . Medication Management    4 month F/U  . Hyperglycemia    check BG 1x every 2 weeks. Low-98, High-114  . Hypertension    HPI  Prediabetes/metabolic syndrome: hgbA1C is at goal, taking Metformin, denies polyphagia, polyuria or polydipsia.   HTN: taking bp medication, denies side effects, no chest pain, no palpitation.   Hammer toe: she continues to have pain on left second toe, she would like to see podiatrist.  Pain is aching like.   Right hip pain: seen by Dr. Hyacinth MeekerMiller, taking Meloxicam and is doing better, taking Tramadol prn, pain is described as a pinching pain that goes from knee to right groin.   Obesity: weight is stable, she is frustrated but [facility] and also occasional soda, eats out frequently but trying to order better meals.   Patient Active Problem List   Diagnosis Date Noted  . Hammer toe of second toe of left foot 03/15/2015  . Benign essential HTN 11/05/2014  . Carpal tunnel syndrome 11/05/2014  . Osteoarthritis 11/05/2014  . Dermatitis, eczematoid 11/05/2014  . Clinical depression 11/05/2014  . Dyslipidemia 11/05/2014  . Gastro-esophageal reflux disease without esophagitis 11/05/2014  . Extreme obesity (HCC) 11/05/2014  . Dysmetabolic syndrome 11/05/2014  . Menopausal and perimenopausal disorder 11/05/2014  . Perennial allergic rhinitis with seasonal variation 11/05/2014  . Seborrhea capitis 11/05/2014  . Acquired trigger finger 11/05/2014  . Urinary incontinence in female 11/05/2014  . Vitamin D deficiency 11/05/2014  . Blood glucose elevated 08/31/2009    Past Surgical History  Procedure Laterality Date  . Vulva cyst  11/2006  . Abdominal hysterectomy    . Cesarean section    . Myomectomy    . Trigger finger Right 12/15/2014    Family History  Problem Relation  Age of Onset  . Dementia Mother   . Diabetes Father   . Cancer Daughter     Fibroid Tumors    Social History   Social History  . Marital Status: Married    Spouse Name: N/A  . Number of Children: N/A  . Years of Education: N/A   Occupational History  . Not on file.   Social History Main Topics  . Smoking status: Never Smoker   . Smokeless tobacco: Never Used  . Alcohol Use: No  . Drug Use: No  . Sexual Activity:    Partners: Male   Other Topics Concern  . Not on file   Social History Narrative     Current outpatient prescriptions:  .  acetaminophen (TYLENOL) 500 MG tablet, Take by mouth., Disp: , Rfl:  .  Amlodipine-Valsartan-HCTZ 5-160-12.5 MG TABS, Take 1 tablet by mouth daily., Disp: 90 tablet, Rfl: 1 .  atorvastatin (LIPITOR) 40 MG tablet, TAKE 1 TABLET BY MOUTH EVERY EVENING FOR CHOLESTEROL, Disp: 90 tablet, Rfl: 1 .  Cyanocobalamin (B-12) 1000 MCG SUBL, Place under the tongue., Disp: , Rfl:  .  fluticasone (FLONASE) 50 MCG/ACT nasal spray, Place 2 sprays into both nostrils daily., Disp: 48 g, Rfl: 0 .  Glucosamine-Chondroitin ([employer] GLUCOSAMINE-CHONDROITIN) 500-400 MG CAPS, Take by mouth., Disp: , Rfl:  .  loratadine (CLARITIN) 10 MG tablet, Take 1 tablet (10 mg total) by mouth daily., Disp: 90 tablet, Rfl: 1 .  meloxicam (MOBIC) 15 MG tablet, Take by  mouth., Disp: , Rfl:  .  metFORMIN (GLUCOPHAGE) 500 MG tablet, TAKE 1 TABLET BY MOUTH TWICE A DAY, Disp: 180 tablet, Rfl: 1 .  nadolol (CORGARD) 20 MG tablet, TAKE 1 TABLET BY MOUTH EVERY EVENING, Disp: 90 tablet, Rfl: 1 .  traMADol (ULTRAM) 50 MG tablet, Take 1 tablet (50 mg total) by mouth every 6 (six) hours as needed. for pain, Disp: 60 tablet, Rfl: 2 .  triamcinolone cream (KENALOG) 0.1 %, TRIAMCINOLONE ACETONIDE, 0.1% (External Cream)  1 Cream apply to nuchal area bid for 30 days  Quantity: 60;  Refills: 2   Ordered :30-Jan-2013  Alba Cory MD;  Started 30-Jan-2013 Active, Disp: , Rfl:   No Known  Allergies   ROS  Constitutional: Negative for fever or weight change.  Respiratory: Negative for cough and shortness of breath.   Cardiovascular: Negative for chest pain or palpitations.  Gastrointestinal: Negative for abdominal pain, no bowel changes.  Musculoskeletal: Negative for gait problem or joint swelling.  Skin: Negative for rash.  Neurological: Negative for dizziness or headache.  No other specific complaints in a complete review of systems (except as listed in HPI above).   Objective  Filed Vitals:   03/15/15 1021  Pulse: 80  Temp: 98.2 F (36.8 C)  TempSrc: Oral  Resp: 16  Height:  (1.575 m)  Weight: 220 lb 3.2 oz (99.882 kg)  SpO2: 97%    Body mass index is 40.26 kg/(m^2).  Physical Exam  Constitutional: Patient appears well-developed and well-nourished. Obese  No distress.  HEENT: head atraumatic, normocephalic, pupils equal and reactive to light, neck supple, throat within normal limits Cardiovascular: Normal rate, regular rhythm and normal heart sounds.  No murmur heard. No BLE edema. Pulmonary/Chest: Effort normal and breath sounds normal. No respiratory distress. Abdominal: Soft.  There is no tenderness. Psychiatric: Patient has a normal mood and affect. behavior is normal. Judgment and thought content normal. Muscular Skeletal: still has pain with abduction of right hip.   Recent Results (from the past 2160 hour(s))  POCT HgB A1C     Status: Normal   Collection Time: 03/15/15 10:33 AM  Result Value Ref Range   Hemoglobin A1C 5.8   POCT UA - Microalbumin     Status: Abnormal   Collection Time: 03/15/15 10:33 AM  Result Value Ref Range   Microalbumin Ur, POC 20 mg/L   Creatinine, POC  mg/dL   Albumin/Creatinine Ratio, Urine, POC      PHQ2/9: Depression screen [facility] 2/9 03/15/2015 01/11/2015 11/06/2014  Decreased Interest 0 0 0  Down, Depressed, Hopeless 0 1 0  PHQ - 2 Score 0 1 0    Fall Risk: Fall Risk  03/15/2015 01/11/2015 11/06/2014   Falls in the past year? No No No    Functional Status Survey: Is the patient deaf or have difficulty hearing?: No Does the patient have difficulty seeing, even when wearing glasses/contacts?: Yes (glasses) Does the patient have difficulty concentrating, remembering, or making decisions?: No Does the patient have difficulty walking or climbing stairs?: No Does the patient have difficulty dressing or bathing?: No Does the patient have difficulty doing errands alone such as visiting a doctor's office or shopping?: No    Assessment & Plan  1. Dysmetabolic syndrome  Doing well, may take Metformin once a day instead of twice daily  - POCT HgB A1C - POCT UA - Microalbumin  2. Benign essential HTN  - Amlodipine-Valsartan-HCTZ 5-160-12.5 MG TABS; Take 1 tablet by mouth daily.  Dispense: 90  tablet; Refill: 1  3. Dyslipidemia  Continue medication   4. Hammer toe of second toe of left foot  Referral sent for Dr. Ether Griffins at [facility]  5. Morbid obesity due to excess calories [facility])  Discussed with the patient the risk posed by an increased BMI. Discussed importance of portion control, calorie counting and at least 150 minutes of physical activity weekly. Avoid sweet beverages and drink more water. Eat at least 6 servings of fruit and vegetables daily . Needs to avoid going to [employer]  6. Primary osteoarthritis of left hip  - traMADol (ULTRAM) 50 MG tablet; Take 1 tablet (50 mg total) by mouth every 6 (six) hours as needed. for pain  Dispense: 60 tablet; Refill: 2

## 2015-03-29 ENCOUNTER — Ambulatory Visit
Admission: RE | Admit: 2015-03-29 | Discharge: 2015-03-29 | Disposition: A | Discharge status: Home / Self Care | Payer: BC Managed Care – PPO | Source: Ambulatory Visit | Attending: Family Medicine | Admitting: Family Medicine

## 2015-03-29 DIAGNOSIS — Z1239 Encounter for other screening for malignant neoplasm of breast: Secondary | ICD-10-CM

## 2015-03-29 DIAGNOSIS — Z1231 Encounter for screening mammogram for malignant neoplasm of breast: Secondary | ICD-10-CM | POA: Diagnosis not present

## 2015-05-25 ENCOUNTER — Ambulatory Visit (INDEPENDENT_AMBULATORY_CARE_PROVIDER_SITE_OTHER): Payer: BC Managed Care – PPO | Admitting: Family Medicine

## 2015-05-25 ENCOUNTER — Encounter: Payer: Self-pay | Admitting: Family Medicine

## 2015-05-25 VITALS — BP 122/78 | HR 72 | Temp 98.7°F | Resp 16 | Ht 62.0 in | Wt 219.4 lb

## 2015-05-25 DIAGNOSIS — Z01818 Encounter for other preprocedural examination: Secondary | ICD-10-CM

## 2015-05-25 DIAGNOSIS — I10 Essential (primary) hypertension: Secondary | ICD-10-CM

## 2015-05-25 DIAGNOSIS — M21612 Bunion of left foot: Secondary | ICD-10-CM

## 2015-05-25 DIAGNOSIS — M2042 Other hammer toe(s) (acquired), left foot: Secondary | ICD-10-CM

## 2015-05-25 DIAGNOSIS — M2041 Other hammer toe(s) (acquired), right foot: Secondary | ICD-10-CM | POA: Diagnosis not present

## 2015-05-25 NOTE — Patient Instructions (Addendum)
  Place preoperative clearance patient instructions here.   Stop aspirin and Meloxicam one week prior to surgery Continue all other medications up to the evening prior to surgery Ask anesthesiologist if you need to take bp medication the morning of surgery

## 2015-05-25 NOTE — Progress Notes (Signed)
Name: Monique Baker   MRN: [id number]    DOB: Aug 13, 1950   Date:05/25/2015       Progress Note  Subjective  Chief Complaint  Chief Complaint  Patient presents with  . Foot Surgery    Clearance, needs surgery on left foot scheduled on 06/16/15 by Gwyneth Revels, DPM.    HPI  Monique Baker came in today for preop clearance. She will have hammer toe and bunion correction of left foot done by Dr. Ether Griffins on 06/16/2015.  She has had multiple surgeries in the past and no history of anesthesia complications. She is able to perform 4 METS of physical activity.  She denies chest pain or palpitation. She has no previous cardiovascular events. She wears partial dentures.   HTN: bp is under good control , she is compliant with medications and denies side effects.   Muscular Skeletal: she has noticed some pain on right hip from OA, also has pain on left foot from hammer toe and increase in size of left bunion. Pain is daily, pain level is 4/10, affecting the type of shoe that she can wear.    Patient Active Problem List   Diagnosis Date Noted  . Hammer toe of second toe of left foot 03/15/2015  . Benign essential HTN 11/05/2014  . Carpal tunnel syndrome 11/05/2014  . Osteoarthritis 11/05/2014  . Dermatitis, eczematoid 11/05/2014  . Clinical depression 11/05/2014  . Dyslipidemia 11/05/2014  . Gastro-esophageal reflux disease without esophagitis 11/05/2014  . Extreme obesity (HCC) 11/05/2014  . Dysmetabolic syndrome 11/05/2014  . Menopausal and perimenopausal disorder 11/05/2014  . Perennial allergic rhinitis with seasonal variation 11/05/2014  . Seborrhea capitis 11/05/2014  . Acquired trigger finger 11/05/2014  . Urinary incontinence in female 11/05/2014  . Vitamin D deficiency 11/05/2014  . Blood glucose elevated 08/31/2009    Past Surgical History  Procedure Laterality Date  . Vulva cyst  11/2006  . Abdominal hysterectomy    . Cesarean section    . Myomectomy    . Trigger finger Right  12/15/2014    Family History  Problem Relation Age of Onset  . Dementia Mother   . Diabetes Father   . Cancer Daughter     Fibroid Tumors    Social History   Social History  . Marital Status: Married    Spouse Name: N/A  . Number of Children: N/A  . Years of Education: N/A   Occupational History  . Not on file.   Social History Main Topics  . Smoking status: Never Smoker   . Smokeless tobacco: Never Used  . Alcohol Use: No  . Drug Use: No  . Sexual Activity:    Partners: Male   Other Topics Concern  . Not on file   Social History Narrative     Current outpatient prescriptions:  .  acetaminophen (TYLENOL) 500 MG tablet, Take by mouth., Disp: , Rfl:  .  Amlodipine-Valsartan-HCTZ 5-160-12.5 MG TABS, Take 1 tablet by mouth daily., Disp: 90 tablet, Rfl: 1 .  atorvastatin (LIPITOR) 40 MG tablet, TAKE 1 TABLET BY MOUTH EVERY EVENING FOR CHOLESTEROL, Disp: 90 tablet, Rfl: 1 .  Cyanocobalamin (B-12) 1000 MCG SUBL, Place under the tongue., Disp: , Rfl:  .  fluticasone (FLONASE) 50 MCG/ACT nasal spray, Place 2 sprays into both nostrils daily., Disp: 48 g, Rfl: 0 .  loratadine (CLARITIN) 10 MG tablet, Take 1 tablet (10 mg total) by mouth daily., Disp: 90 tablet, Rfl: 1 .  metFORMIN (GLUCOPHAGE) 500 MG tablet, TAKE 1  TABLET BY MOUTH TWICE A DAY, Disp: 180 tablet, Rfl: 1 .  nadolol (CORGARD) 20 MG tablet, TAKE 1 TABLET BY MOUTH EVERY EVENING, Disp: 90 tablet, Rfl: 1 .  traMADol (ULTRAM) 50 MG tablet, Take 1 tablet (50 mg total) by mouth every 6 (six) hours as needed. for pain, Disp: 60 tablet, Rfl: 2 .  triamcinolone cream (KENALOG) 0.1 %, TRIAMCINOLONE ACETONIDE, 0.1% (External Cream)  1 Cream apply to nuchal area bid for 30 days  Quantity: 60;  Refills: 2   Ordered :30-Jan-2013  Alba Cory MD;  Started 30-Jan-2013 Active, Disp: , Rfl:   No Known Allergies   ROS  Constitutional: Negative for fever or weight change.  Respiratory: Negative for cough and shortness of breath.    Cardiovascular: Negative for chest pain or palpitations.  Gastrointestinal: Negative for abdominal pain, no bowel changes.  Musculoskeletal: Positive for gait problem or joint swelling.  Skin: Negative for rash.  Neurological: Negative for dizziness or headache.  No other specific complaints in a complete review of systems (except as listed in HPI above).   Objective  Filed Vitals:   05/25/15 0815  BP: 122/78  Pulse: 72  Temp: 98.7 F (37.1 C)  TempSrc: Oral  Resp: 16  Height:  (1.575 m)  Weight: 219 lb 6.4 oz (99.519 kg)  SpO2: 97%    Body mass index is 40.12 kg/(m^2).  Physical Exam  Constitutional: Patient appears well-developed and well-nourished. Obese No distress.  HEENT: head atraumatic, normocephalic, pupils equal and reactive to light,  neck supple, throat within normal limits Cardiovascular: Normal rate, regular rhythm and normal heart sounds.  No murmur heard. No BLE edema. Pulmonary/Chest: Effort normal and breath sounds normal. No respiratory distress. Abdominal: Soft.  There is no tenderness. Psychiatric: Patient has a normal mood and affect. behavior is normal. Judgment and thought content normal. Muscular Skeletal: no pain during palpation of right hip, normal rom, she has to use the desk to help her get up, left foot has bunion and also hammer toe second toe  Recent Results (from the past 2160 hour(s))  POCT HgB A1C     Status: Normal   Collection Time: 03/15/15 10:33 AM  Result Value Ref Range   Hemoglobin A1C 5.8   POCT UA - Microalbumin     Status: Abnormal   Collection Time: 03/15/15 10:33 AM  Result Value Ref Range   Microalbumin Ur, POC 20 mg/L   Creatinine, POC  mg/dL   Albumin/Creatinine Ratio, Urine, POC       PHQ2/9: Depression screen [facility] 2/9 05/25/2015 03/15/2015 01/11/2015 11/06/2014  Decreased Interest 0 0 0 0  Down, Depressed, Hopeless 0 0 1 0  PHQ - 2 Score 0 0 1 0    Fall Risk: Fall Risk  05/25/2015 03/15/2015 01/11/2015  11/06/2014  Falls in the past year? No No No No    Functional Status Survey: Is the patient deaf or have difficulty hearing?: No Does the patient have difficulty seeing, even when wearing glasses/contacts?: Yes (glasses) Does the patient have difficulty concentrating, remembering, or making decisions?: No Does the patient have difficulty walking or climbing stairs?: No Does the patient have difficulty dressing or bathing?: No Does the patient have difficulty doing errands alone such as visiting a doctor's office or shopping?: No    Assessment & Plan  1. Benign essential HTN  Continue medication, take Nadolol as schedule the night before surgery and discuss with anesthesiologist if she should take Exforge the morning of surgery -  EKG 12-Lead  2. Hammer toe of second toe of left foot  Low risk of complications, may proceed to surgery without further testing  3. Bunion, left foot  See above  4. Preoperative examination  - EKG 12-Lead

## 2015-06-09 ENCOUNTER — Encounter: Payer: Self-pay | Admitting: *Deleted

## 2015-06-16 ENCOUNTER — Encounter
Admission: RE | Disposition: A | Discharge status: Home / Self Care | Payer: Self-pay | Source: Ambulatory Visit | Attending: Podiatry

## 2015-06-16 ENCOUNTER — Ambulatory Visit
Admission: RE | Admit: 2015-06-16 | Discharge: 2015-06-16 | Disposition: A | Discharge status: Home / Self Care | Payer: BC Managed Care – PPO | Source: Ambulatory Visit | Attending: Podiatry | Admitting: Podiatry

## 2015-06-16 ENCOUNTER — Ambulatory Visit: Payer: BC Managed Care – PPO | Admitting: Anesthesiology

## 2015-06-16 DIAGNOSIS — Z90710 Acquired absence of both cervix and uterus: Secondary | ICD-10-CM | POA: Diagnosis not present

## 2015-06-16 DIAGNOSIS — I10 Essential (primary) hypertension: Secondary | ICD-10-CM | POA: Diagnosis not present

## 2015-06-16 DIAGNOSIS — M2042 Other hammer toe(s) (acquired), left foot: Secondary | ICD-10-CM | POA: Insufficient documentation

## 2015-06-16 DIAGNOSIS — M2012 Hallux valgus (acquired), left foot: Secondary | ICD-10-CM | POA: Insufficient documentation

## 2015-06-16 DIAGNOSIS — K219 Gastro-esophageal reflux disease without esophagitis: Secondary | ICD-10-CM | POA: Insufficient documentation

## 2015-06-16 DIAGNOSIS — Z79899 Other long term (current) drug therapy: Secondary | ICD-10-CM | POA: Insufficient documentation

## 2015-06-16 HISTORY — DX: Prediabetes: R73.03

## 2015-06-16 HISTORY — DX: Presence of dental prosthetic device (complete) (partial): Z97.2

## 2015-06-16 HISTORY — PX: HAMMER TOE SURGERY: SHX385

## 2015-06-16 HISTORY — PX: HALLUX VALGUS LAPIDUS: SHX6626

## 2015-06-16 LAB — GLUCOSE, CAPILLARY
Glucose-Capillary: 93 mg/dL (ref 65–99)
Glucose-Capillary: 80 mg/dL (ref 65–99)

## 2015-06-16 SURGERY — BUNIONECTOMY, LAPIDUS
Anesthesia: Regional | Site: Toe | Laterality: Left | Wound class: Clean

## 2015-06-16 MED ORDER — OXYCODONE-ACETAMINOPHEN 5-325 MG PO TABS: 1.0000 | ORAL_TABLET | ORAL | Status: DC | PRN

## 2015-06-16 MED ORDER — ONDANSETRON HCL 4 MG PO TABS: 4.0000 mg | ORAL_TABLET | Freq: Four times a day (QID) | ORAL | Status: DC | PRN

## 2015-06-16 MED ORDER — LACTATED RINGERS IV SOLN
INTRAVENOUS | Status: DC
  Administered 2015-06-16: 11:00:00 via INTRAVENOUS

## 2015-06-16 MED ORDER — FENTANYL CITRATE (PF) 100 MCG/2ML IJ SOLN: 25.0000 ug | INTRAMUSCULAR | Status: DC | PRN

## 2015-06-16 MED ORDER — BUPIVACAINE HCL (PF) 0.25 % IJ SOLN
INTRAMUSCULAR | Status: DC | PRN
  Administered 2015-06-16: 10 mL

## 2015-06-16 MED ORDER — MIDAZOLAM HCL 2 MG/2ML IJ SOLN
INTRAMUSCULAR | Status: DC | PRN
  Administered 2015-06-16 (×2): 2 mg via INTRAVENOUS

## 2015-06-16 MED ORDER — CEFAZOLIN SODIUM-DEXTROSE 2-3 GM-% IV SOLR
2.0000 g | Freq: Once | INTRAVENOUS | Status: AC
  Administered 2015-06-16: 2 g via INTRAVENOUS

## 2015-06-16 MED ORDER — LIDOCAINE HCL (CARDIAC) 20 MG/ML IV SOLN
INTRAVENOUS | Status: DC | PRN
  Administered 2015-06-16: 50 mg via INTRAVENOUS

## 2015-06-16 MED ORDER — SCOPOLAMINE 1 MG/3DAYS TD PT72
1.0000 | MEDICATED_PATCH | Freq: Once | TRANSDERMAL | Status: DC
  Administered 2015-06-16: 1.5 mg via TRANSDERMAL

## 2015-06-16 MED ORDER — ROPIVACAINE HCL 5 MG/ML IJ SOLN
INTRAMUSCULAR | Status: DC | PRN
  Administered 2015-06-16: 35 mL via PERINEURAL

## 2015-06-16 MED ORDER — PROPOFOL 500 MG/50ML IV EMUL
INTRAVENOUS | Status: DC | PRN
  Administered 2015-06-16: 50 ug/kg/min via INTRAVENOUS

## 2015-06-16 MED ORDER — ONDANSETRON HCL 4 MG/2ML IJ SOLN
INTRAMUSCULAR | Status: DC | PRN
  Administered 2015-06-16: 4 mg via INTRAVENOUS

## 2015-06-16 MED ORDER — ONDANSETRON HCL 4 MG/2ML IJ SOLN: 4.0000 mg | Freq: Once | INTRAMUSCULAR | Status: DC | PRN

## 2015-06-16 MED ORDER — FENTANYL CITRATE (PF) 100 MCG/2ML IJ SOLN
INTRAMUSCULAR | Status: DC | PRN
  Administered 2015-06-16: 50 ug via INTRAVENOUS

## 2015-06-16 MED ORDER — GLYCOPYRROLATE 0.2 MG/ML IJ SOLN
INTRAMUSCULAR | Status: DC | PRN
  Administered 2015-06-16: 0.1 mg via INTRAVENOUS

## 2015-06-16 MED ORDER — ONDANSETRON HCL 4 MG/2ML IJ SOLN: 4.0000 mg | Freq: Four times a day (QID) | INTRAMUSCULAR | Status: DC | PRN

## 2015-06-16 SURGICAL SUPPLY — 45 items
BENZOIN TINCTURE PRP APPL 2/3 (GAUZE/BANDAGES/DRESSINGS) ×3 IMPLANT
BLADE MED AGGRESSIVE (BLADE) IMPLANT
BLADE OSC/SAGITTAL 5.5X25 (BLADE) IMPLANT
BLADE OSC/SAGITTAL MD 5.5X18 (BLADE) ×3 IMPLANT
BLADE OSC/SAGITTAL MD 9X18.5 (BLADE) IMPLANT
BNDG ESMARK 4X12 TAN STRL LF (GAUZE/BANDAGES/DRESSINGS) ×3 IMPLANT
BNDG GAUZE 4.5X4.1 6PLY STRL (MISCELLANEOUS) ×3 IMPLANT
BNDG STRETCH 4X75 STRL LF (GAUZE/BANDAGES/DRESSINGS) ×3 IMPLANT
CANISTER SUCT 1200ML W/VALVE (MISCELLANEOUS) ×3 IMPLANT
CONTROL 360 (Bone Implant) ×3 IMPLANT
COVER LIGHT HANDLE UNIVERSAL (MISCELLANEOUS) ×6 IMPLANT
CUFF TOURN SGL QUICK 18 (TOURNIQUET CUFF) ×3 IMPLANT
DRAPE FLUOR MINI C-ARM 54X84 (DRAPES) ×3 IMPLANT
DRSG TEGADERM 6X8 (GAUZE/BANDAGES/DRESSINGS) ×3 IMPLANT
DURAPREP 26ML APPLICATOR (WOUND CARE) ×3 IMPLANT
FIXATION HAMMERTOE ANGLD 15MM (Toe) ×2 IMPLANT
GAUZE PETRO XEROFOAM 1X8 (MISCELLANEOUS) ×3 IMPLANT
GAUZE SPONGE 4X4 12PLY STRL (GAUZE/BANDAGES/DRESSINGS) ×3 IMPLANT
GLOVE BIO SURGEON STRL SZ7.5 (GLOVE) ×9 IMPLANT
GLOVE INDICATOR 8.0 STRL GRN (GLOVE) ×6 IMPLANT
GOWN STRL REUS W/ TWL LRG LVL3 (GOWN DISPOSABLE) ×6 IMPLANT
GOWN STRL REUS W/TWL LRG LVL3 (GOWN DISPOSABLE) ×3
HAMMERTOE ANGLED 15MM 5MM (Toe) ×3 IMPLANT
K-WIRE DBL END TROCAR 6X.045 (WIRE) ×3
K-WIRE DBL END TROCAR 6X.062 (WIRE)
KIT DRILL HAMMERLOCK2 IMPLANT (BIT) ×3 IMPLANT
KIT ROOM TURNOVER OR (KITS) ×3 IMPLANT
KWIRE DBL END TROCAR 6X.045 (WIRE) ×2 IMPLANT
KWIRE DBL END TROCAR 6X.062 (WIRE) IMPLANT
NS IRRIG 500ML POUR BTL (IV SOLUTION) ×3 IMPLANT
PACK EXTREMITY ARMC (MISCELLANEOUS) ×3 IMPLANT
PAD GROUND ADULT SPLIT (MISCELLANEOUS) ×3 IMPLANT
RASP SM TEAR CROSS CUT (RASP) ×3 IMPLANT
STOCKINETTE STRL 6IN 960660 (GAUZE/BANDAGES/DRESSINGS) ×3 IMPLANT
STRAP BODY AND KNEE 60X3 (MISCELLANEOUS) ×3 IMPLANT
STRIP CLOSURE SKIN 1/4X4 (GAUZE/BANDAGES/DRESSINGS) ×3 IMPLANT
SUT ETHILON 4-0 (SUTURE) ×1
SUT ETHILON 4-0 FS2 18XMFL BLK (SUTURE) ×2
SUT ETHILON 5-0 FS-2 18 BLK (SUTURE) IMPLANT
SUT MNCRL 5-0+ PC-1 (SUTURE) ×2 IMPLANT
SUT MONOCRYL 5-0 (SUTURE) ×1
SUT VIC AB 3-0 SH 27 (SUTURE) ×1
SUT VIC AB 3-0 SH 27X BRD (SUTURE) ×2 IMPLANT
SUT VIC AB 4-0 FS2 27 (SUTURE) ×6 IMPLANT
SUTURE ETHLN 4-0 FS2 18XMF BLK (SUTURE) ×2 IMPLANT

## 2015-06-16 NOTE — Op Note (Signed)
Operative note   Surgeon:Lum Stillinger Lawyer: None    Preop diagnosis: 1 left foot hallux valgus 2. Hammertoe left second toe    Postop diagnosis: Same    Procedure: 1. Lapidus hallux valgus correction with lapiplasty set 2. Hammertoe repair with hammerlock implant 3. Metatarsophalangeal joint capsulotomy left second MTPJ    EBL: Minimal    Anesthesia:regional and IV sedation    Hemostasis: Midcalf tourniquet inflated to 250 mmHg for 119 minutes    Specimen: None    Complications: None    Operative indications:Monique Baker is an 65 y.o. that presents today for surgical intervention.  The risks/benefits/alternatives/complications have been discussed and consent has been given.    Procedure:  Patient was brought into the OR and placed on the operating table in thesupine position. After anesthesia was obtained the left lower extremity was prepped and draped in usual sterile fashion.  Attention was directed to the dorsal aspect of the first met cuneiform joint where a longitudinal incision was made from proximal and distal to the joint. Sharp and blunt dissection carried down to the long extensor tendon. The tendon was reflected laterally. The first met cuneiform joint was then entered. The dorsomedial aspect of the joint was freed. Next with a power saw the plantar aspect of the joint was loosened. A guidewire was placed at the base of the first metatarsal for rotational alignment. At this time good rotation alignment was noted. The fulcrum was placed between the first met and second met joint. Next the positional guide was placed on the base of the first metatarsal and a stab incision was made at the base of the second metatarsal. The position was then stabilized onto the first metatarsal and a frontal plane and transverse plain correction of the intermetatarsal angle was noted. This was then stabilized with a wire through the positioner. At this time the joint seeker was placed in  the first met cuneiform joint. The cut guides were then placed over top of this. This was then stabilized with guidewires. Next then 7 mm cut guide was placed and the base of the first metatarsal and distal aspect of the medial cuneiform osteotomies were created. All the articular cartilage was removed down to good healthy bone. The bone was then fenestrated with a 2.0 mm drill bit. The K wire was removed and the first met cuneiform joint was manually compressed. A wire was placed from the distal lateral aspect of the first metatarsal crossing the first met cuneiform joint with good compression. A second guidewire was placed to stabilize the position. At this time good alignment was noted to all areas and good reduction of the intermetatarsal angle was noted. The dorsal and medial locking plates were placed from the Medulla set. These were noted to be very stable. Good alignment was noted in all positions.   At this time an incision was made overlying the dorsal medial first MTPJ. The prominent dorsal medial eminence was noted and transected with a power saw. A small capsulorrhaphy was performed medially. The deeper layers were closed with a combination of 30 and 4-0 Vicryl. The skin was closed with a 5-0 Monocryl. This time a longitudinal incision was made curvilinear from the MTPJ to the PIPJ. Sharp and blunt dissection was carried down to the proximal interphalangeal joint. The long extensor tendon was tenotomized. This exposed the head of the proximal phalanx and base of the middle phalanx. The head of proximal phalanx was then excised in the  base of the middle phalanx was excised down to good bone.  Mild dorsal subluxation was noted at the MTPJ. Dissection was carried to the MTPJ in a dorsal medial and lateral capsulotomy was performed. The second toe was in a better position. A long extensor tendon lengthening was then performed. Next the medium sized angulated hammerlock implant was placed using normal  technique into the PIPJ area. Good alignment and stability was noted. Good compression across the fusion site was noted. All areas were then closed with a 4-0 Vicryl for the deeper layer and 4-0 nylon for skin. 0.025% Marcaine was applied all around all areas. A well compressive sterile dressing was then applied. She is placed in a equalizer walker boot for nonweightbearing.    Patient tolerated the procedure and anesthesia well.  Was transported from the OR to the PACU with all vital signs stable and vascular status intact. To be discharged per routine protocol.  Will follow up in approximately 1 week in the outpatient clinic.

## 2015-06-16 NOTE — Anesthesia Preprocedure Evaluation (Signed)
Anesthesia Evaluation  Patient identified by MRN, date of birth, ID band  Reviewed: Allergy & Precautions, H&P , NPO status , Patient's Chart, lab work & pertinent test results  Airway Mallampati: II  TM Distance: >3 FB Neck ROM: full    Dental no notable dental hx.    Pulmonary    Pulmonary exam normal        Cardiovascular hypertension,  Rhythm:regular Rate:Normal     Neuro/Psych PSYCHIATRIC DISORDERS    GI/Hepatic GERD  ,  Endo/Other    Renal/GU      Musculoskeletal   Abdominal   Peds  Hematology   Anesthesia Other Findings   Reproductive/Obstetrics                             Anesthesia Physical Anesthesia Plan  ASA: II  Anesthesia Plan: General LMA and Regional   Post-op Pain Management: GA combined w/ Regional for post-op pain   Induction:   Airway Management Planned:   Additional Equipment:   Intra-op Plan:   Post-operative Plan:   Informed Consent: I have reviewed the patients History and Physical, chart, labs and discussed the procedure including the risks, benefits and alternatives for the proposed anesthesia with the patient or authorized representative who has indicated his/her understanding and acceptance.     Plan Discussed with: CRNA  Anesthesia Plan Comments:         Anesthesia Quick Evaluation

## 2015-06-16 NOTE — Anesthesia Postprocedure Evaluation (Signed)
Anesthesia Post Note  Patient: KEELA RUBERT  Procedure(s) Performed: Procedure(s) (LRB): HALLUX VALGUS LAPIDUS (Left) HAMMER TOE CORRECTION SECOND TOE LEFT (Left)  Patient location during evaluation: PACU Anesthesia Type: Regional and MAC Level of consciousness: awake and alert and oriented Pain management: satisfactory to patient Vital Signs Assessment: post-procedure vital signs reviewed and stable Respiratory status: spontaneous breathing, nonlabored ventilation and respiratory function stable Cardiovascular status: blood pressure returned to baseline and stable Postop Assessment: Adequate PO intake and No signs of nausea or vomiting Anesthetic complications: no    Cherly [place]

## 2015-06-16 NOTE — Transfer of Care (Signed)
Immediate Anesthesia Transfer of Care Note  Patient: Monique Baker  Procedure(s) Performed: Procedure(s) with comments: HALLUX VALGUS LAPIDUS (Left) - WITH POPLITEAL HAMMER TOE CORRECTION SECOND TOE LEFT (Left) - prediabetic - on oral meds  Patient Location: PACU  Anesthesia Type: General LMA, Regional  Level of Consciousness: awake, alert  and patient cooperative  Airway and Oxygen Therapy: Patient Spontanous Breathing and Patient connected to supplemental oxygen  Post-op Assessment: Post-op Vital signs reviewed, Patient's Cardiovascular Status Stable, Respiratory Function Stable, Patent Airway and No signs of Nausea or vomiting  Post-op Vital Signs: Reviewed and stable  Complications: No apparent anesthesia complications

## 2015-06-16 NOTE — Discharge Instructions (Signed)
[facility]  POST OPERATIVE INSTRUCTIONS FOR DR. TROXLER AND DR. [facility] PODIATRY DEPARTMENT   1. Take your medication as prescribed.  Pain medication should be taken only as needed.  2. Keep the dressing clean, dry and intact.  3. Keep your foot elevated above the heart level for the first 48 hours.  4. Walking to the bathroom and brief periods of walking are acceptable, unless we have instructed you to be non-weight bearing.  5. Always wear your post-op shoe when walking.  Always use your crutches if you are to be non-weight bearing.  6. Do not take a shower. Baths are permissible as long as the foot is kept out of the water.   7. Every hour you are awake:  - Bend your knee 15 times.   8. Call [facility] [phone]) if any of the following problems occur: - You develop a temperature or fever. - The bandage becomes saturated with blood. - Medication does not stop your pain. - Injury of the foot occurs. - Any symptoms of infection including redness, odor, or red streaks running from wound.  General Anesthesia, Adult, Care After Refer to this sheet in the next few weeks. These instructions provide you with information on caring for yourself after your procedure. Your health care provider may also give you more specific instructions. Your treatment has been planned according to current medical practices, but problems sometimes occur. Call your health care provider if you have any problems or questions after your procedure. WHAT TO EXPECT AFTER THE PROCEDURE After the procedure, it is typical to experience:  Sleepiness.  Nausea and vomiting. HOME CARE INSTRUCTIONS  For the first 24 hours after general anesthesia:  Have a responsible person with you.  Do not drive a car. If you are alone, do not take public transportation.  Do not drink alcohol.  Do not take medicine that has not been prescribed by your  health care provider.  Do not sign important papers or make important decisions.  You may resume a normal diet and activities as directed by your health care provider.  Change bandages (dressings) as directed.  If you have questions or problems that seem related to general anesthesia, call the hospital and ask for the anesthetist or anesthesiologist on call. SEEK MEDICAL CARE IF:  You have nausea and vomiting that continue the day after anesthesia.  You develop a rash. SEEK IMMEDIATE MEDICAL CARE IF:   You have difficulty breathing.  You have chest pain.  You have any allergic problems.   This information is not intended to replace advice given to you by your health care provider. Make sure you discuss any questions you have with your health care provider.   Document Released: 07/24/2000 Document Revised: 05/08/2014 Document Reviewed: 08/16/2011 Elsevier Interactive Patient Education [employer]

## 2015-06-16 NOTE — H&P (Signed)
  HISTORY AND PHYSICAL INTERVAL NOTE:  06/16/2015  12:04 PM  Monique Baker  has presented today for surgery, with the diagnosis of M20.12 HALLUX VALGUS M20.42 HAMMERTOE.  The various methods of treatment have been discussed with the patient.  No guarantees were given.  After consideration of risks, benefits and other options for treatment, the patient has consented to surgery.  I have reviewed the patients' chart and labs.    Patient Vitals for the past 24 hrs:  BP Temp Temp src Pulse Resp SpO2 Height Weight  06/16/15 1103 (!) 162/83 mmHg - - 79 14 100 % - -  06/16/15 1041 (!) 148/77 mmHg 97.9 F (36.6 C) Temporal 75 16 100 %  (1.575 m) 97.523 kg (215 lb)    A history and physical examination was performed in my office.  The patient was reexamined.  There have been no changes to this history and physical examination.  Gwyneth Revels A

## 2015-06-16 NOTE — Progress Notes (Signed)
Assisted Mike Stella ANMD with left, popliteal/saphenous block. Side rails up, monitors on throughout procedure. See vital signs in flow sheet. Tolerated Procedure well. ° °

## 2015-06-16 NOTE — Anesthesia Procedure Notes (Addendum)
Anesthesia Regional Block:  Popliteal block  Pre-Anesthetic Checklist: ,, timeout performed, Correct Patient, Correct Site, Correct Laterality, Correct Procedure, Correct Position, site marked, Risks and benefits discussed,  Surgical consent,  Pre-op evaluation,  At surgeon's request and post-op pain management  Laterality: Left  Prep: chloraprep       Needles:  Injection technique: Single-shot  Needle Type: Echogenic Needle     Needle Length: 9cm 9 cm Needle Gauge: 21 and 21 G    Additional Needles:  Procedures: ultrasound guided (picture in chart) Popliteal block Narrative:  Start time: 06/16/2015 11:02 AM End time: 06/16/2015 11:08 AM Injection made incrementally with aspirations every 5 mL.  Performed by: Personally  Anesthesiologist: Ranee Gosselin  Additional Notes: Functioning IV was confirmed and monitors applied. Ultrasound guidance: relevant anatomy identified, needle position confirmed, local anesthetic spread visualized around nerve(s)., vascular puncture avoided.  Image printed for medical record.  Negative aspiration and no paresthesias; incremental administration of local anesthetic. The patient tolerated the procedure well. Vitals signes recorded in RN notes. 25ml to popliteal, and 10 ml to adductor canal.   Procedure Name: MAC Performed by: Jimmy Picket Pre-anesthesia Checklist: Patient identified, Emergency Drugs available, Suction available, Patient being monitored and Timeout performed Patient Re-evaluated:Patient Re-evaluated prior to inductionOxygen Delivery Method: Simple face mask Placement Confirmation: positive ETCO2 and breath sounds checked- equal and bilateral

## 2015-06-17 ENCOUNTER — Encounter: Payer: Self-pay | Admitting: Podiatry

## 2015-07-12 ENCOUNTER — Ambulatory Visit: Payer: BC Managed Care – PPO | Admitting: Family Medicine

## 2015-07-15 ENCOUNTER — Ambulatory Visit (INDEPENDENT_AMBULATORY_CARE_PROVIDER_SITE_OTHER): Payer: BC Managed Care – PPO | Admitting: Family Medicine

## 2015-07-15 ENCOUNTER — Encounter: Payer: Self-pay | Admitting: Family Medicine

## 2015-07-15 VITALS — BP 112/72 | HR 72 | Temp 98.0°F | Resp 12 | Wt 219.0 lb

## 2015-07-15 DIAGNOSIS — Z9889 Other specified postprocedural states: Secondary | ICD-10-CM | POA: Diagnosis not present

## 2015-07-15 DIAGNOSIS — I10 Essential (primary) hypertension: Secondary | ICD-10-CM

## 2015-07-15 DIAGNOSIS — E8881 Metabolic syndrome: Secondary | ICD-10-CM

## 2015-07-15 DIAGNOSIS — E785 Hyperlipidemia, unspecified: Secondary | ICD-10-CM

## 2015-07-15 DIAGNOSIS — Z8739 Personal history of other diseases of the musculoskeletal system and connective tissue: Secondary | ICD-10-CM

## 2015-07-15 LAB — POCT GLYCOSYLATED HEMOGLOBIN (HGB A1C): HEMOGLOBIN A1C: 6.4

## 2015-07-15 MED ORDER — NADOLOL 20 MG PO TABS: 20.0000 mg | ORAL_TABLET | Freq: Every evening | ORAL | Status: DC

## 2015-07-15 MED ORDER — METFORMIN HCL 500 MG PO TABS
500.0000 mg | ORAL_TABLET | Freq: Two times a day (BID) | ORAL | Status: DC
Start: 2015-07-15 — End: 2016-01-17

## 2015-07-15 MED ORDER — ATORVASTATIN CALCIUM 40 MG PO TABS: 40.0000 mg | ORAL_TABLET | Freq: Every evening | ORAL | Status: DC

## 2015-07-15 NOTE — Progress Notes (Signed)
Name: Monique Baker   MRN: [id number]    DOB: 03-Jan-1951   Date:07/15/2015       Progress Note  Subjective  Chief Complaint  Chief Complaint  Patient presents with  . Hypertension    patient is here for her 58-month f/u. she has been doing pretty good and has not had any neg. sx.  . Obesity  . Osteoarthritis    HPI  Prediabetes/metabolic syndrome: hgbA1C is at goal, taking Metformin and is down to one pill daily,  denies polyphagia, polyuria or polydipsia.   HTN: taking bp medication, denies side effects, no chest pain, no palpitation. She is complaint with medication   History of hammer toe and bunion repair of left foot: done in Feb 2017 by Dr. Ether Griffins, still using an ortho boot and scooter. She has been contracting her calf and moving toes, no calf tenderness, or swelling.   Right hip pain: seen by Dr. Hyacinth Meeker, taking Meloxicam prn. She states that since left foot surgery, the right hip has not caused any problems. No current pain.  Obesity: weight was not checked today because she is wearing an Ortho boot and can't stand on it. She feels like since the surgery she has lost weight, eating more home made meals, not going to eat as often.   Patient Active Problem List   Diagnosis Date Noted  . History of bunionectomy of left great toe 07/15/2015  . H/O hammer toe correction 03/15/2015  . Benign essential HTN 11/05/2014  . Carpal tunnel syndrome 11/05/2014  . Osteoarthritis 11/05/2014  . Dermatitis, eczematoid 11/05/2014  . Clinical depression 11/05/2014  . Dyslipidemia 11/05/2014  . Gastro-esophageal reflux disease without esophagitis 11/05/2014  . Extreme obesity (HCC) 11/05/2014  . Dysmetabolic syndrome 11/05/2014  . Menopausal and perimenopausal disorder 11/05/2014  . Perennial allergic rhinitis with seasonal variation 11/05/2014  . Seborrhea capitis 11/05/2014  . Acquired trigger finger 11/05/2014  . Urinary incontinence in female 11/05/2014  . Vitamin D deficiency  11/05/2014  . Blood glucose elevated 08/31/2009    Past Surgical History  Procedure Laterality Date  . Vulva cyst  11/2006  . Abdominal hysterectomy    . Cesarean section    . Myomectomy    . Trigger finger Right 12/15/2014  . Hallux valgus lapidus Left 06/16/2015    Procedure: HALLUX VALGUS LAPIDUS;  Surgeon: Gwyneth Revels, DPM;  Location: [facility];  Service: Podiatry;  Laterality: Left;  WITH POPLITEAL  . Hammer toe surgery Left 06/16/2015    Procedure: HAMMER TOE CORRECTION SECOND TOE LEFT;  Surgeon: Gwyneth Revels, DPM;  Location: [facility];  Service: Podiatry;  Laterality: Left;  prediabetic - on oral meds    Family History  Problem Relation Age of Onset  . Dementia Mother   . Diabetes Father   . Cancer Daughter     Fibroid Tumors    Social History   Social History  . Marital Status: Married    Spouse Name: N/A  . Number of Children: N/A  . Years of Education: N/A   Occupational History  . Not on file.   Social History Main Topics  . Smoking status: Never Smoker   . Smokeless tobacco: Never Used  . Alcohol Use: No  . Drug Use: No  . Sexual Activity:    Partners: Male   Other Topics Concern  . Not on file   Social History Narrative     Current outpatient prescriptions:  .  acetaminophen (TYLENOL) 500 MG tablet, Take by  mouth., Disp: , Rfl:  .  Amlodipine-Valsartan-HCTZ 5-160-12.5 MG TABS, Take 1 tablet by mouth daily., Disp: 90 tablet, Rfl: 1 .  atorvastatin (LIPITOR) 40 MG tablet, Take 1 tablet (40 mg total) by mouth every evening. for cholesterol, Disp: 90 tablet, Rfl: 1 .  Cyanocobalamin (B-12) 1000 MCG SUBL, Place under the tongue., Disp: , Rfl:  .  loratadine (CLARITIN) 10 MG tablet, Take 1 tablet (10 mg total) by mouth daily., Disp: 90 tablet, Rfl: 1 .  metFORMIN (GLUCOPHAGE) 500 MG tablet, TAKE 1 TABLET BY MOUTH TWICE A DAY, Disp: 180 tablet, Rfl: 1 .  nadolol (CORGARD) 20 MG tablet, Take 1 tablet (20 mg total) by mouth every  evening., Disp: 90 tablet, Rfl: 1 .  fluticasone (FLONASE) 50 MCG/ACT nasal spray, Place 2 sprays into both nostrils daily. (Patient not taking: Reported on 07/15/2015), Disp: 48 g, Rfl: 0 .  meloxicam (MOBIC) 7.5 MG tablet, Take 7.5 mg by mouth daily as needed for pain. Reported on 07/15/2015, Disp: , Rfl:  .  traMADol (ULTRAM) 50 MG tablet, Take 1 tablet (50 mg total) by mouth every 6 (six) hours as needed. for pain (Patient not taking: Reported on 07/15/2015), Disp: 60 tablet, Rfl: 2 .  triamcinolone cream (KENALOG) 0.1 %, Reported on 07/15/2015, Disp: , Rfl:   No Known Allergies   ROS  Constitutional: Negative for fever or weight change.  Respiratory: Negative for cough and shortness of breath.   Cardiovascular: Negative for chest pain or palpitations.  Gastrointestinal: Negative for abdominal pain, positive  bowel changes ( when she was taking oxycodone - but resolved now).  Musculoskeletal: Positive  for gait problem , no joint swelling.  Skin: Negative for rash.  Neurological: Negative for dizziness or headache.  No other specific complaints in a complete review of systems (except as listed in HPI above).  Objective  Filed Vitals:   07/15/15 1029  BP: 112/72  Pulse: 72  Temp: 98 F (36.7 C)  TempSrc: Oral  Resp: 12  Weight: 219 lb (99.338 kg)  SpO2: 97%    Body mass index is 40.05 kg/(m^2).  Physical Exam  Constitutional: Patient appears well-developed and well-nourished. Obese No distress.  HEENT: head atraumatic, normocephalic, pupils equal and reactive to light, neck supple, throat within normal limits Cardiovascular: Normal rate, regular rhythm and normal heart sounds.  No murmur heard. No BLE edema. Pulmonary/Chest: Effort normal and breath sounds normal. No respiratory distress. Abdominal: Soft.  There is no tenderness. Psychiatric: Patient has a normal mood and affect. behavior is normal. Judgment and thought content normal. Muscular Skeletal: using a scooter to  move around, ortho boot on left foot.   Recent Results (from the past 2160 hour(s))  Glucose, capillary     Status: None   Collection Time: 06/16/15 10:44 AM  Result Value Ref Range   Glucose-Capillary 93 65 - 99 mg/dL  Glucose, capillary     Status: None   Collection Time: 06/16/15  3:24 PM  Result Value Ref Range   Glucose-Capillary 80 65 - 99 mg/dL     ZOX0/9: Depression screen [facility] 2/9 07/15/2015 05/25/2015 03/15/2015 01/11/2015 11/06/2014  Decreased Interest 0 0 0 0 0  Down, Depressed, Hopeless 0 0 0 1 0  PHQ - 2 Score 0 0 0 1 0     Fall Risk: Fall Risk  07/15/2015 05/25/2015 03/15/2015 01/11/2015 11/06/2014  Falls in the past year? Yes No No No No  Number falls in past yr: 2 or more - - - -  Injury with Fall? Yes - - - -     Functional Status Survey: Is the patient deaf or have difficulty hearing?: No Does the patient have difficulty seeing, even when wearing glasses/contacts?: Yes (glasses) Does the patient have difficulty concentrating, remembering, or making decisions?: No Does the patient have difficulty walking or climbing stairs?: No Does the patient have difficulty dressing or bathing?: No Does the patient have difficulty doing errands alone such as visiting a doctor's office or shopping?: No    Assessment & Plan  1. Benign essential HTN  - nadolol (CORGARD) 20 MG tablet; Take 1 tablet (20 mg total) by mouth every evening.  Dispense: 90 tablet; Refill: 1  2. Dyslipidemia  - atorvastatin (LIPITOR) 40 MG tablet; Take 1 tablet (40 mg total) by mouth every evening. for cholesterol  Dispense: 90 tablet; Refill: 1  3. Dysmetabolic syndrome  - POCT glycosylated hemoglobin (Hb A1C) - metFORMIN (GLUCOPHAGE) 500 MG tablet; Take 1 tablet (500 mg total) by mouth 2 (two) times daily.  Dispense: 180 tablet; Refill: 1  4. History of hammer toe correction  Doing well   5. History of bunionectomy of left great toe  Doing well

## 2015-07-16 ENCOUNTER — Encounter: Payer: Self-pay | Admitting: Family Medicine

## 2015-09-30 ENCOUNTER — Other Ambulatory Visit: Payer: Self-pay | Admitting: Family Medicine

## 2015-09-30 MED ORDER — BENZONATATE 100 MG PO CAPS: 100.0000 mg | ORAL_CAPSULE | Freq: Two times a day (BID) | ORAL | Status: DC | PRN

## 2015-09-30 NOTE — Telephone Encounter (Signed)
Refill request was sent to Dr. Krichna Sowles for approval and submission.  

## 2015-09-30 NOTE — Telephone Encounter (Signed)
Patient wanted to know if she could get a refill on Benzonatate for her cough.  [facility] [place]  Patient's call back number is [phone].

## 2015-10-06 ENCOUNTER — Telehealth: Payer: Self-pay | Admitting: Family Medicine

## 2015-10-06 NOTE — Telephone Encounter (Signed)
Pt states she is out of town in [place] and she took all of her meds with her except the Amlodipine and is asking for a refill to be [facility] [phone].

## 2015-10-11 ENCOUNTER — Other Ambulatory Visit: Payer: Self-pay | Admitting: Family Medicine

## 2015-10-11 NOTE — Telephone Encounter (Signed)
Patient requesting refill. 

## 2015-11-12 ENCOUNTER — Other Ambulatory Visit: Payer: Self-pay | Admitting: Family Medicine

## 2015-11-15 ENCOUNTER — Ambulatory Visit (INDEPENDENT_AMBULATORY_CARE_PROVIDER_SITE_OTHER): Payer: BC Managed Care – PPO | Admitting: Family Medicine

## 2015-11-15 ENCOUNTER — Encounter: Payer: Self-pay | Admitting: Family Medicine

## 2015-11-15 VITALS — BP 138/76 | HR 68 | Temp 98.7°F | Resp 16 | Ht 62.0 in | Wt 221.3 lb

## 2015-11-15 DIAGNOSIS — R739 Hyperglycemia, unspecified: Secondary | ICD-10-CM | POA: Diagnosis not present

## 2015-11-15 DIAGNOSIS — Z9889 Other specified postprocedural states: Secondary | ICD-10-CM

## 2015-11-15 DIAGNOSIS — I10 Essential (primary) hypertension: Secondary | ICD-10-CM

## 2015-11-15 DIAGNOSIS — R5383 Other fatigue: Secondary | ICD-10-CM

## 2015-11-15 DIAGNOSIS — E785 Hyperlipidemia, unspecified: Secondary | ICD-10-CM

## 2015-11-15 DIAGNOSIS — E559 Vitamin D deficiency, unspecified: Secondary | ICD-10-CM | POA: Diagnosis not present

## 2015-11-15 DIAGNOSIS — E538 Deficiency of other specified B group vitamins: Secondary | ICD-10-CM

## 2015-11-15 DIAGNOSIS — F330 Major depressive disorder, recurrent, mild: Secondary | ICD-10-CM | POA: Diagnosis not present

## 2015-11-15 DIAGNOSIS — E8881 Metabolic syndrome: Secondary | ICD-10-CM | POA: Diagnosis not present

## 2015-11-15 LAB — CBC WITH DIFFERENTIAL/PLATELET
BASOS PCT: 0 %
Basophils Absolute: 0 cells/uL (ref 0–200)
EOS ABS: 372 {cells}/uL (ref 15–500)
EOS PCT: 4 %
HCT: 41.2 % (ref 35.0–45.0)
Hemoglobin: 13.4 g/dL (ref 11.7–15.5)
Lymphocytes Relative: 19 %
Lymphs Abs: 1767 cells/uL (ref 850–3900)
MCH: 27.1 pg (ref 27.0–33.0)
MCHC: 32.5 g/dL (ref 32.0–36.0)
MCV: 83.2 fL (ref 80.0–100.0)
MONOS PCT: 7 %
MPV: 9.2 fL (ref 7.5–12.5)
Monocytes Absolute: 651 cells/uL (ref 200–950)
Neutro Abs: 6510 cells/uL (ref 1500–7800)
Neutrophils Relative %: 70 %
PLATELETS: 367 10*3/uL (ref 140–400)
RBC: 4.95 MIL/uL (ref 3.80–5.10)
RDW: 14 % (ref 11.0–15.0)
WBC: 9.3 10*3/uL (ref 3.8–10.8)

## 2015-11-15 LAB — COMPLETE METABOLIC PANEL WITH GFR
ALK PHOS: 79 U/L (ref 33–130)
ALT: 12 U/L (ref 6–29)
AST: 15 U/L (ref 10–35)
Albumin: 4.2 g/dL (ref 3.6–5.1)
BUN: 16 mg/dL (ref 7–25)
CHLORIDE: 104 mmol/L (ref 98–110)
CO2: 29 mmol/L (ref 20–31)
Calcium: 9.7 mg/dL (ref 8.6–10.4)
Creat: 0.83 mg/dL (ref 0.50–0.99)
GFR, EST NON AFRICAN AMERICAN: 75 mL/min (ref >=60)
GFR, Est African American: 86 mL/min (ref >=60)
GLUCOSE: 94 mg/dL (ref 65–99)
POTASSIUM: 4 mmol/L (ref 3.5–5.3)
SODIUM: 143 mmol/L (ref 135–146)
Total Bilirubin: 1.5 mg/dL — ABNORMAL HIGH (ref 0.2–1.2)
Total Protein: 7 g/dL (ref 6.1–8.1)

## 2015-11-15 LAB — TSH: TSH: 1.18 m[IU]/L

## 2015-11-15 LAB — LIPID PANEL
CHOL/HDL RATIO: 1.9 ratio (ref <=5.0)
Cholesterol: 112 mg/dL — ABNORMAL LOW (ref 125–200)
HDL: 58 mg/dL (ref >=46)
LDL CALC: 35 mg/dL (ref <130)
Triglycerides: 94 mg/dL (ref <150)
VLDL: 19 mg/dL (ref <30)

## 2015-11-15 LAB — HEMOGLOBIN A1C
Hgb A1c MFr Bld: 5.9 % — ABNORMAL HIGH (ref <5.7)
Mean Plasma Glucose: 123 mg/dL

## 2015-11-15 LAB — VITAMIN B12: VITAMIN B 12: 1064 pg/mL (ref 200–1100)

## 2015-11-15 MED ORDER — DULOXETINE HCL 30 MG PO CPEP: 30.0000 mg | ORAL_CAPSULE | Freq: Every day | ORAL | Status: DC

## 2015-11-15 NOTE — Progress Notes (Signed)
Name: Monique Baker   MRN: [id number]    DOB: 1950-10-09   Date:11/15/2015       Progress Note  Subjective  Chief Complaint  Chief Complaint  Patient presents with  . Medication Refill    4 month F/U  . Back Pain    Patient states since having her right foot surgery and being off of her feet her hip and back was feeling better. But since she has been up and walking her pain is radiating to upper right thigh. Onset-April 2017, intermittently pain- better when sitting, patient states Aleve helps and Meloxicam.  . Hypertension  . Hyperlipidemia  . Allergic Rhinitis     Claritin helps symptoms control    HPI  Prediabetes/metabolic syndrome: we will recheck hgbA1C,  taking Metformin and is down to one pill daily, denies polyphagia, polyuria or polydipsia.   HTN: taking bp medication, denies side effects, no chest pain, no palpitation. She is complaint with medication   History of hammer toe and bunion repair of left foot: done in Feb 2017 by Dr. Ether Griffins, still using an ortho boot and scooter. She is feeling well   Right hip pain: seen by Dr. Hyacinth Meeker, taking Meloxicam prn, but not as often now.. She states that since left foot surgery, the right hip has not caused any problems. She states the pain is more on her lower back and sometimes radiate to right groin. She had X-ray done by Dr. Hyacinth Meeker. Advised to go back to Dr. Hyacinth Meeker to repeat x-ray  Obesity: she states she has changed her diet, avoiding bread, drinking more water, started back walking, but continues to gain weight. She is upset about her weight gain.   Depression Major: she is married to a Education officer, environmental, he also works as a Airline pilot at [employer]. She is only baby-sitting grandchildren prn. She has lack of motivation, feels tired. She states she loves scrap booking but does not feel like doing it. She has noticed crying spells almost daily. She feels sad. She also feels like everyone at church likes her husband but just tolerates her.     Patient Active Problem List   Diagnosis Date Noted  . History of bunionectomy of left great toe 07/15/2015  . H/O hammer toe correction 03/15/2015  . Benign essential HTN 11/05/2014  . Carpal tunnel syndrome 11/05/2014  . Osteoarthritis 11/05/2014  . Dermatitis, eczematoid 11/05/2014  . Clinical depression 11/05/2014  . Dyslipidemia 11/05/2014  . Gastro-esophageal reflux disease without esophagitis 11/05/2014  . Extreme obesity (HCC) 11/05/2014  . Dysmetabolic syndrome 11/05/2014  . Menopausal and perimenopausal disorder 11/05/2014  . Perennial allergic rhinitis with seasonal variation 11/05/2014  . Seborrhea capitis 11/05/2014  . Acquired trigger finger 11/05/2014  . Urinary incontinence in female 11/05/2014  . Vitamin D deficiency 11/05/2014  . Blood glucose elevated 08/31/2009    Past Surgical History  Procedure Laterality Date  . Vulva cyst  11/2006  . Abdominal hysterectomy    . Cesarean section    . Myomectomy    . Trigger finger Right 12/15/2014  . Hallux valgus lapidus Left 06/16/2015    Procedure: HALLUX VALGUS LAPIDUS;  Surgeon: Gwyneth Revels, DPM;  Location: [facility];  Service: Podiatry;  Laterality: Left;  WITH POPLITEAL  . Hammer toe surgery Left 06/16/2015    Procedure: HAMMER TOE CORRECTION SECOND TOE LEFT;  Surgeon: Gwyneth Revels, DPM;  Location: [facility];  Service: Podiatry;  Laterality: Left;  prediabetic - on oral meds    Family History  Problem Relation Age of Onset  . Dementia Mother   . Diabetes Father   . Cancer Daughter     Fibroid Tumors    Social History   Social History  . Marital Status: Married    Spouse Name: N/A  . Number of Children: N/A  . Years of Education: N/A   Occupational History  . Not on file.   Social History Main Topics  . Smoking status: Never Smoker   . Smokeless tobacco: Never Used  . Alcohol Use: No  . Drug Use: No  . Sexual Activity:    Partners: Male   Other Topics Concern  . Not  on file   Social History Narrative     Current outpatient prescriptions:  .  acetaminophen (TYLENOL) 500 MG tablet, Take by mouth. Reported on 11/15/2015, Disp: , Rfl:  .  Amlodipine-Valsartan-HCTZ 5-160-12.5 MG TABS, TAKE 1 TABLET BY MOUTH DAILY., Disp: 90 tablet, Rfl: 1 .  atorvastatin (LIPITOR) 40 MG tablet, Take 1 tablet (40 mg total) by mouth every evening. for cholesterol, Disp: 90 tablet, Rfl: 1 .  Cyanocobalamin (B-12) 1000 MCG SUBL, Place under the tongue., Disp: , Rfl:  .  fluticasone (FLONASE) 50 MCG/ACT nasal spray, Place 2 sprays into both nostrils daily., Disp: 48 g, Rfl: 0 .  loratadine (CLARITIN) 10 MG tablet, TAKE 1 TABLET (10 MG TOTAL) BY MOUTH DAILY., Disp: 90 tablet, Rfl: 1 .  metFORMIN (GLUCOPHAGE) 500 MG tablet, Take 1 tablet (500 mg total) by mouth 2 (two) times daily., Disp: 180 tablet, Rfl: 1 .  nadolol (CORGARD) 20 MG tablet, Take 1 tablet (20 mg total) by mouth every evening., Disp: 90 tablet, Rfl: 1 .  traMADol (ULTRAM) 50 MG tablet, Take 1 tablet (50 mg total) by mouth every 6 (six) hours as needed. for pain, Disp: 60 tablet, Rfl: 2 .  triamcinolone cream (KENALOG) 0.1 %, Reported on 07/15/2015, Disp: , Rfl:  .  meloxicam (MOBIC) 7.5 MG tablet, Take 7.5 mg by mouth daily as needed for pain. Reported on 11/15/2015, Disp: , Rfl:   No Known Allergies   ROS  Constitutional: Negative for fever or weight change.  Respiratory: Negative for cough and shortness of breath.   Cardiovascular: Negative for chest pain or palpitations.  Gastrointestinal: Negative for abdominal pain, no bowel changes.  Musculoskeletal: Negative for gait problem or joint swelling.  Skin: Negative for rash.  Neurological: Negative for dizziness or headache.  No other specific complaints in a complete review of systems (except as listed in HPI above).   Objective  Filed Vitals:   11/15/15 1041  BP: 138/76  Pulse: 68  Temp: 98.7 F (37.1 C)  TempSrc: Oral  Resp: 16  Height:   (1.575 m)  Weight: 221 lb 4.8 oz (100.381 kg)  SpO2: 97%    Body mass index is 40.47 kg/(m^2).  Physical Exam  Constitutional: Patient appears well-developed and well-nourished. Obese  No distress.  HEENT: head atraumatic, normocephalic, pupils equal and reactive to light, neck supple, throat within normal limits Cardiovascular: Normal rate, regular rhythm and normal heart sounds.  No murmur heard. No BLE edema. Pulmonary/Chest: Effort normal and breath sounds normal. No respiratory distress. Abdominal: Soft.  There is no tenderness. Psychiatric: Patient has a depressed mood and affect. behavior is normal. Judgment and thought content normal.  PHQ2/9: Depression screen [facility] 2/9 11/15/2015 07/15/2015 05/25/2015 03/15/2015 01/11/2015  Decreased Interest 0 0 0 0 0  Down, Depressed, Hopeless 0 0 0 0 1  PHQ -  2 Score 0 0 0 0 1     Fall Risk: Fall Risk  11/15/2015 07/15/2015 05/25/2015 03/15/2015 01/11/2015  Falls in the past year? No Yes No No No  Number falls in past yr: - 2 or more - - -  Injury with Fall? - Yes - - -     Functional Status Survey: Is the patient deaf or have difficulty hearing?: No Does the patient have difficulty seeing, even when wearing glasses/contacts?: No Does the patient have difficulty concentrating, remembering, or making decisions?: No Does the patient have difficulty walking or climbing stairs?: No Does the patient have difficulty dressing or bathing?: No Does the patient have difficulty doing errands alone such as visiting a doctor's office or shopping?: No    Assessment & Plan  1. Benign essential HTN  - COMPLETE METABOLIC PANEL WITH GFR  2. Dyslipidemia  - Lipid panel  3. Dysmetabolic syndrome  Continue metformin   4. History of bunionectomy of left great toe  Healing well   5. Morbid obesity due to excess calories [facility](HCC)  Discussed with the patient the risk posed by an increased BMI. Discussed importance of portion control, calorie  counting and at least 150 minutes of physical activity weekly. Avoid sweet beverages and drink more water. Eat at least 6 servings of fruit and vegetables daily   6. B12 deficiency  - Vitamin B12  7. Hyperglycemia  - Hemoglobin A1c  8. Other fatigue  - COMPLETE METABOLIC PANEL WITH GFR - VITAMIN D 25 Hydroxy (Vit-D Deficiency, Fractures) - TSH - CBC with Differential/Platelet  9. Depression, major, recurrent, mild (HCC)  - DULoxetine (CYMBALTA) 30 MG capsule; Take 1 capsule (30 mg total) by mouth daily. And increase to 60 mg after the first week  Dispense: 60 capsule; Refill: 0  10. Vitamin D deficiency  - VITAMIN D 25 Hydroxy (Vit-D Deficiency, Fractures)

## 2015-11-16 LAB — VITAMIN D 25 HYDROXY (VIT D DEFICIENCY, FRACTURES): Vit D, 25-Hydroxy: 27 ng/mL — ABNORMAL LOW (ref 30–100)

## 2015-11-18 ENCOUNTER — Telehealth: Payer: Self-pay

## 2015-11-18 NOTE — Telephone Encounter (Signed)
Patient was unavailable left vm for her to return my call regarding labs.

## 2015-11-18 NOTE — Telephone Encounter (Signed)
-----   Message from Alba CoryKrichna Sowles, MD sent at 11/17/2015  3:30 PM EDT ----- Vitamin D is only slightly low, she can take otc vitamin D 2000 units daily  hgbA1C is back to normal - she still needs to follow a low sugar diet, because of Metabolic Syndrome/prediabetes A21B12 is normal Lipid panel is at goal, continue medication CBC, TSH and Sugar, kidney and transaminases are within normal limits

## 2015-12-20 ENCOUNTER — Ambulatory Visit (INDEPENDENT_AMBULATORY_CARE_PROVIDER_SITE_OTHER): Payer: BC Managed Care – PPO | Admitting: Family Medicine

## 2015-12-20 ENCOUNTER — Encounter: Payer: Self-pay | Admitting: Family Medicine

## 2015-12-20 VITALS — BP 126/68 | HR 82 | Temp 98.6°F | Resp 18 | Ht 62.0 in | Wt 217.4 lb

## 2015-12-20 DIAGNOSIS — F330 Major depressive disorder, recurrent, mild: Secondary | ICD-10-CM

## 2015-12-20 DIAGNOSIS — F411 Generalized anxiety disorder: Secondary | ICD-10-CM

## 2015-12-20 MED ORDER — DULOXETINE HCL 60 MG PO CPEP: 60.0000 mg | ORAL_CAPSULE | Freq: Every day | ORAL | 2 refills | Status: DC

## 2015-12-20 NOTE — Progress Notes (Signed)
Name: Monique Baker   MRN: [id number]    DOB: 14-Dec-1950   Date:12/20/2015       Progress Note  Subjective  Chief Complaint  Chief Complaint  Patient presents with  . Depression    1 month follow up    HPI  Obesity: she has lost 4 lbs since she was seen one month ago. She is still following a healthier diet, avoiding a lot carbohydrates, but since Depression/Anxiety better controlled she has not been feeling as hungry.   Depression Major: she is married to a Theme park manager, he also works as a Network engineer at [employer] She is only baby-sitting grandchildren prn. We started her on Cymbalta 10/2015 and is feeling better but still very tired and some lack of motivation but no longer having crying spells, and not feeling so hungry. Asking church members to help her out - delegating responsibilities. She feels like her husband is always the focus of attention, advised her to discuss her feelings with him. GAD: discussed symptoms, she is feeling better.   Patient Active Problem List   Diagnosis Date Noted  . History of bunionectomy of left great toe 07/15/2015  . H/O hammer toe correction 03/15/2015  . Benign essential HTN 11/05/2014  . Carpal tunnel syndrome 11/05/2014  . Osteoarthritis 11/05/2014  . Dermatitis, eczematoid 11/05/2014  . Depression, major, recurrent, mild ([place]) 11/05/2014  . Dyslipidemia 11/05/2014  . Gastro-esophageal reflux disease without esophagitis 11/05/2014  . Extreme obesity ([place]) 11/05/2014  . Dysmetabolic syndrome 25/85/2778  . Menopausal and perimenopausal disorder 11/05/2014  . Perennial allergic rhinitis with seasonal variation 11/05/2014  . Seborrhea capitis 11/05/2014  . Acquired trigger finger 11/05/2014  . Urinary incontinence in female 11/05/2014  . Vitamin D deficiency 11/05/2014  . Blood glucose elevated 08/31/2009    Past Surgical History:  Procedure Laterality Date  . ABDOMINAL HYSTERECTOMY    . CESAREAN SECTION    . HALLUX VALGUS LAPIDUS Left  06/16/2015   Procedure: HALLUX VALGUS LAPIDUS;  Surgeon: Samara Deist, DPM;  Location: [place];  Service: Podiatry;  Laterality: Left;  WITH POPLITEAL  . HAMMER TOE SURGERY Left 06/16/2015   Procedure: HAMMER TOE CORRECTION SECOND TOE LEFT;  Surgeon: Samara Deist, DPM;  Location: [place];  Service: Podiatry;  Laterality: Left;  prediabetic - on oral meds  . MYOMECTOMY    . trigger finger Right 12/15/2014  . Vulva cyst  11/2006    Family History  Problem Relation Age of Onset  . Dementia Mother   . Diabetes Father   . Cancer Daughter     Fibroid Tumors    Social History   Social History  . Marital status: Married    Spouse name: N/A  . Number of children: N/A  . Years of education: N/A   Occupational History  . Not on file.   Social History Main Topics  . Smoking status: Never Smoker  . Smokeless tobacco: Never Used  . Alcohol use No  . Drug use: No  . Sexual activity: Yes    Partners: Male   Other Topics Concern  . Not on file   Social History Narrative  . No narrative on file     Current Outpatient Prescriptions:  .  acetaminophen (TYLENOL) 500 MG tablet, Take by mouth. Reported on 11/15/2015, Disp: , Rfl:  .  Amlodipine-Valsartan-HCTZ 5-160-12.5 MG TABS, TAKE 1 TABLET BY MOUTH DAILY., Disp: 90 tablet, Rfl: 1 .  atorvastatin (LIPITOR) 40 MG tablet, Take 1 tablet (40 mg total)  by mouth every evening. for cholesterol, Disp: 90 tablet, Rfl: 1 .  Cyanocobalamin (B-12) 1000 MCG SUBL, Place under the tongue., Disp: , Rfl:  .  DULoxetine (CYMBALTA) 60 MG capsule, Take 1 capsule (60 mg total) by mouth daily., Disp: 30 capsule, Rfl: 2 .  fluticasone (FLONASE) 50 MCG/ACT nasal spray, Place 2 sprays into both nostrils daily., Disp: 48 g, Rfl: 0 .  loratadine (CLARITIN) 10 MG tablet, TAKE 1 TABLET (10 MG TOTAL) BY MOUTH DAILY., Disp: 90 tablet, Rfl: 1 .  metFORMIN (GLUCOPHAGE) 500 MG tablet, Take 1 tablet (500 mg total) by mouth 2 (two) times daily.,  Disp: 180 tablet, Rfl: 1 .  nadolol (CORGARD) 20 MG tablet, Take 1 tablet (20 mg total) by mouth every evening., Disp: 90 tablet, Rfl: 1 .  triamcinolone cream (KENALOG) 0.1 %, Reported on 07/15/2015, Disp: , Rfl:   No Known Allergies   ROS  Constitutional: Negative for fever, positive for mild weight change.  Respiratory: Negative for cough and shortness of breath.   Cardiovascular: Negative for chest pain or palpitations.  Gastrointestinal: Negative for abdominal pain, no bowel changes.  Musculoskeletal: Negative for gait problem or joint swelling.  Skin: Negative for rash.  Neurological: Negative for dizziness or headache.  No other specific complaints in a complete review of systems (except as listed in HPI above).  Objective  Vitals:   12/20/15 1148  BP: 126/68  Pulse: 82  Resp: 18  Temp: 98.6 F (37 C)  SpO2: 97%  Weight: 217 lb 7 oz (98.6 kg)  Height: '5\' 2"'  (1.575 m)    Body mass index is 39.77 kg/m.  Physical Exam  Constitutional: Patient appears well-developed and well-nourished. Obese  No distress.  HEENT: head atraumatic, normocephalic, pupils equal and reactive to light, neck supple, throat within normal limits Cardiovascular: Normal rate, regular rhythm and normal heart sounds.  No murmur heard. No BLE edema. Pulmonary/Chest: Effort normal and breath sounds normal. No respiratory distress. Abdominal: Soft.  There is no tenderness. Psychiatric: Patient has a normal mood and affect. behavior is normal. Judgment and thought content normal.  Recent Results (from the past 2160 hour(s))  Lipid panel     Status: Abnormal   Collection Time: 11/15/15 11:50 AM  Result Value Ref Range   Cholesterol 112 (L) 125 - 200 mg/dL   Triglycerides 94 <150 mg/dL   HDL 58 >=46 mg/dL   Total CHOL/HDL Ratio 1.9 <=5.0 Ratio   VLDL 19 <30 mg/dL   LDL Cholesterol 35 <130 mg/dL    Comment:   Total Cholesterol/HDL Ratio:CHD Risk                        Coronary Heart Disease Risk  Table                                        Men       Women          1/2 Average Risk              3.4        3.3              Average Risk              5.0        4.4           2X Average  Risk              9.6        7.1           3X Average Risk             23.4       11.0 Use the calculated Patient Ratio above and the CHD Risk table  to determine the patient's CHD Risk.   COMPLETE METABOLIC PANEL WITH GFR     Status: Abnormal   Collection Time: 11/15/15 11:50 AM  Result Value Ref Range   Sodium 143 135 - 146 mmol/L   Potassium 4.0 3.5 - 5.3 mmol/L   Chloride 104 98 - 110 mmol/L   CO2 29 20 - 31 mmol/L   Glucose, Bld 94 65 - 99 mg/dL   BUN 16 7 - 25 mg/dL   Creat 0.83 0.50 - 0.99 mg/dL    Comment:   For patients > or = 65 years of age: The upper reference limit for Creatinine is approximately 13% higher for people identified as African-American.      Total Bilirubin 1.5 (H) 0.2 - 1.2 mg/dL   Alkaline Phosphatase 79 33 - 130 U/L   AST 15 10 - 35 U/L   ALT 12 6 - 29 U/L   Total Protein 7.0 6.1 - 8.1 g/dL   Albumin 4.2 3.6 - 5.1 g/dL   Calcium 9.7 8.6 - 10.4 mg/dL   GFR, Est African American 86 >=60 mL/min   GFR, Est Non African American 75 >=60 mL/min  Hemoglobin A1c     Status: Abnormal   Collection Time: 11/15/15 11:50 AM  Result Value Ref Range   Hgb A1c MFr Bld 5.9 (H) <5.7 %    Comment:   For someone without known diabetes, a hemoglobin A1c value between 5.7% and 6.4% is consistent with prediabetes and should be confirmed with a follow-up test.   For someone with known diabetes, a value <7% indicates that their diabetes is well controlled. A1c targets should be individualized based on duration of diabetes, age, co-morbid conditions and other considerations.   This assay result is consistent with an increased risk of diabetes.   Currently, no consensus exists regarding use of hemoglobin A1c for diagnosis of diabetes in children.      Mean Plasma Glucose 123  mg/dL  Vitamin B12     Status: None   Collection Time: 11/15/15 11:50 AM  Result Value Ref Range   Vitamin B-12 1,064 200 - 1,100 pg/mL  VITAMIN D 25 Hydroxy (Vit-D Deficiency, Fractures)     Status: Abnormal   Collection Time: 11/15/15 11:50 AM  Result Value Ref Range   Vit D, 25-Hydroxy 27 (L) 30 - 100 ng/mL    Comment: Vitamin D Status           25-OH Vitamin D        Deficiency                <20 ng/mL        Insufficiency         20 - 29 ng/mL        Optimal             > or = 30 ng/mL   For 25-OH Vitamin D testing on patients on D2-supplementation and patients for whom quantitation of D2 and D3 fractions is required, the QuestAssureD 25-OH VIT D, (D2,D3), LC/MS/MS is recommended: order code [id number] (patients > 2 yrs).  TSH     Status: None   Collection Time: 11/15/15 11:50 AM  Result Value Ref Range   TSH 1.18 mIU/L    Comment:   Reference Range   > or = 20 Years  0.40-4.50   Pregnancy Range First trimester  0.26-2.66 Second trimester 0.55-2.73 Third trimester  0.43-2.91     CBC with Differential/Platelet     Status: None   Collection Time: 11/15/15 11:50 AM  Result Value Ref Range   WBC 9.3 3.8 - 10.8 K/uL   RBC 4.95 3.80 - 5.10 MIL/uL   Hemoglobin 13.4 11.7 - 15.5 g/dL   HCT 41.2 35.0 - 45.0 %   MCV 83.2 80.0 - 100.0 fL   MCH 27.1 27.0 - 33.0 pg   MCHC 32.5 32.0 - 36.0 g/dL   RDW 14.0 11.0 - 15.0 %   Platelets 367 140 - 400 K/uL   MPV 9.2 7.5 - 12.5 fL   Neutro Abs 6,510 1,500 - 7,800 cells/uL   Lymphs Abs 1,767 850 - 3,900 cells/uL   Monocytes Absolute 651 200 - 950 cells/uL   Eosinophils Absolute 372 15 - 500 cells/uL   Basophils Absolute 0 0 - 200 cells/uL   Neutrophils Relative % 70 %   Lymphocytes Relative 19 %   Monocytes Relative 7 %   Eosinophils Relative 4 %   Basophils Relative 0 %   Smear Review Criteria for review not met     Comment: ** Please note change in unit of measure and reference range(s). **      PHQ2/9: Depression screen  [facility] 2/9 12/20/2015 12/20/2015 11/15/2015 07/15/2015 05/25/2015  Decreased Interest 1 0 0 0 0  Down, Depressed, Hopeless 1 0 0 0 0  PHQ - 2 Score 2 0 0 0 0  Altered sleeping 0 - - - -  Tired, decreased energy 3 - - - -  Change in appetite 0 - - - -  Feeling bad or failure about yourself  0 - - - -  Trouble concentrating 0 - - - -  Moving slowly or fidgety/restless 0 - - - -  Suicidal thoughts 0 - - - -  PHQ-9 Score 5 - - - -  Difficult doing work/chores Somewhat difficult - - - -     Fall Risk: Fall Risk  12/20/2015 11/15/2015 07/15/2015 05/25/2015 03/15/2015  Falls in the past year? Yes No Yes No No  Number falls in past yr: 1 - 2 or more - -  Injury with Fall? No - Yes - -      Functional Status Survey: Is the patient deaf or have difficulty hearing?: No Does the patient have difficulty seeing, even when wearing glasses/contacts?: No Does the patient have difficulty concentrating, remembering, or making decisions?: No Does the patient have difficulty walking or climbing stairs?: No Does the patient have difficulty dressing or bathing?: No Does the patient have difficulty doing errands alone such as visiting a doctor's office or shopping?: No    Assessment & Plan  1. Depression, major, recurrent, mild ([facility])  Continue medication  - DULoxetine (CYMBALTA) 60 MG capsule; Take 1 capsule (60 mg total) by mouth daily.  Dispense: 30 capsule; Refill: 2 Explained importance of counseling  2. Morbid obesity due to excess calories [facility])  Discussed with the patient the risk posed by an increased BMI. Discussed importance of portion control, calorie counting and at least 150 minutes of physical activity weekly. Avoid sweet beverages and drink more water. Eat at least  6 servings of fruit and vegetables daily   3. GAD (generalized anxiety disorder)  - DULoxetine (CYMBALTA) 60 MG capsule; Take 1 capsule (60 mg total) by mouth daily.  Dispense: 30 capsule; Refill: 2

## 2016-01-17 ENCOUNTER — Other Ambulatory Visit: Payer: Self-pay

## 2016-01-17 ENCOUNTER — Encounter: Payer: Self-pay | Admitting: Family Medicine

## 2016-01-17 ENCOUNTER — Ambulatory Visit (INDEPENDENT_AMBULATORY_CARE_PROVIDER_SITE_OTHER): Payer: BC Managed Care – PPO | Admitting: Family Medicine

## 2016-01-17 VITALS — BP 124/64 | HR 82 | Temp 98.3°F | Resp 16 | Ht 62.0 in | Wt 214.6 lb

## 2016-01-17 DIAGNOSIS — Z0000 Encounter for general adult medical examination without abnormal findings: Secondary | ICD-10-CM

## 2016-01-17 DIAGNOSIS — Z23 Encounter for immunization: Secondary | ICD-10-CM | POA: Diagnosis not present

## 2016-01-17 DIAGNOSIS — Z1239 Encounter for other screening for malignant neoplasm of breast: Secondary | ICD-10-CM

## 2016-01-17 DIAGNOSIS — E8881 Metabolic syndrome: Secondary | ICD-10-CM

## 2016-01-17 DIAGNOSIS — I10 Essential (primary) hypertension: Secondary | ICD-10-CM

## 2016-01-17 DIAGNOSIS — Z1211 Encounter for screening for malignant neoplasm of colon: Secondary | ICD-10-CM

## 2016-01-17 DIAGNOSIS — Z01419 Encounter for gynecological examination (general) (routine) without abnormal findings: Secondary | ICD-10-CM

## 2016-01-17 DIAGNOSIS — E785 Hyperlipidemia, unspecified: Secondary | ICD-10-CM

## 2016-01-17 LAB — GLUCOSE, POCT (MANUAL RESULT ENTRY): POC Glucose: 106 mg/dl — AB (ref 70–99)

## 2016-01-17 MED ORDER — ATORVASTATIN CALCIUM 40 MG PO TABS: 40.0000 mg | ORAL_TABLET | Freq: Every evening | ORAL | 1 refills | Status: DC

## 2016-01-17 MED ORDER — METFORMIN HCL 500 MG PO TABS: 500.0000 mg | ORAL_TABLET | Freq: Two times a day (BID) | ORAL | 1 refills | Status: DC

## 2016-01-17 MED ORDER — GLUCOSE BLOOD VI STRP: ORAL_STRIP | 12 refills | Status: DC

## 2016-01-17 MED ORDER — NADOLOL 20 MG PO TABS: 20.0000 mg | ORAL_TABLET | Freq: Every evening | ORAL | 1 refills | Status: DC

## 2016-01-17 NOTE — Progress Notes (Signed)
Name: Monique Baker   MRN: [id number]    DOB: 1950-08-13   Date:01/17/2016       Progress Note  Subjective  Chief Complaint  Chief Complaint  Patient presents with  . Annual Exam    HPI  Well Woman Exam: She is worried about her DM, glucose has been getting higher than usual, even though she has been more careful with her diet - avoiding sweet tea and desserts. Glucose fasting this morning was up to 170.  Colonoscopy is due now.  Mammogram up to date, and pap smear not needed, s/p hysterectomy   Patient Active Problem List   Diagnosis Date Noted  . History of bunionectomy of left great toe 07/15/2015  . H/O hammer toe correction 03/15/2015  . Benign essential HTN 11/05/2014  . Carpal tunnel syndrome 11/05/2014  . Osteoarthritis 11/05/2014  . Dermatitis, eczematoid 11/05/2014  . Depression, major, recurrent, mild ([place]) 11/05/2014  . Dyslipidemia 11/05/2014  . Gastro-esophageal reflux disease without esophagitis 11/05/2014  . Extreme obesity ([place]) 11/05/2014  . Dysmetabolic syndrome 62/22/9798  . Menopausal and perimenopausal disorder 11/05/2014  . Perennial allergic rhinitis with seasonal variation 11/05/2014  . Seborrhea capitis 11/05/2014  . Acquired trigger finger 11/05/2014  . Urinary incontinence in female 11/05/2014  . Vitamin D deficiency 11/05/2014  . Blood glucose elevated 08/31/2009    Past Surgical History:  Procedure Laterality Date  . ABDOMINAL HYSTERECTOMY    . CESAREAN SECTION    . HALLUX VALGUS LAPIDUS Left 06/16/2015   Procedure: HALLUX VALGUS LAPIDUS;  Surgeon: Samara Deist, DPM;  Location: [place];  Service: Podiatry;  Laterality: Left;  WITH POPLITEAL  . HAMMER TOE SURGERY Left 06/16/2015   Procedure: HAMMER TOE CORRECTION SECOND TOE LEFT;  Surgeon: Samara Deist, DPM;  Location: [place];  Service: Podiatry;  Laterality: Left;  prediabetic - on oral meds  . MYOMECTOMY    . trigger finger Right 12/15/2014  . Vulva cyst  11/2006     Family History  Problem Relation Age of Onset  . Dementia Mother   . Diabetes Father   . Cancer Daughter     Fibroid Tumors    Social History   Social History  . Marital status: Married    Spouse name: N/A  . Number of children: N/A  . Years of education: N/A   Occupational History  . Not on file.   Social History Main Topics  . Smoking status: Never Smoker  . Smokeless tobacco: Never Used  . Alcohol use No  . Drug use: No  . Sexual activity: Yes    Partners: Male   Other Topics Concern  . Not on file   Social History Narrative  . No narrative on file     Current Outpatient Prescriptions:  .  acetaminophen (TYLENOL) 500 MG tablet, Take by mouth. Reported on 11/15/2015, Disp: , Rfl:  .  Amlodipine-Valsartan-HCTZ 5-160-12.5 MG TABS, TAKE 1 TABLET BY MOUTH DAILY., Disp: 90 tablet, Rfl: 1 .  atorvastatin (LIPITOR) 40 MG tablet, Take 1 tablet (40 mg total) by mouth every evening. for cholesterol, Disp: 90 tablet, Rfl: 1 .  Cyanocobalamin (B-12) 1000 MCG SUBL, Place under the tongue., Disp: , Rfl:  .  DULoxetine (CYMBALTA) 60 MG capsule, Take 1 capsule (60 mg total) by mouth daily., Disp: 30 capsule, Rfl: 2 .  fluticasone (FLONASE) 50 MCG/ACT nasal spray, Place 2 sprays into both nostrils daily., Disp: 48 g, Rfl: 0 .  loratadine (CLARITIN) 10 MG tablet, TAKE 1  TABLET (10 MG TOTAL) BY MOUTH DAILY., Disp: 90 tablet, Rfl: 1 .  metFORMIN (GLUCOPHAGE) 500 MG tablet, Take 1 tablet (500 mg total) by mouth 2 (two) times daily., Disp: 180 tablet, Rfl: 1 .  nadolol (CORGARD) 20 MG tablet, Take 1 tablet (20 mg total) by mouth every evening., Disp: 90 tablet, Rfl: 1 .  triamcinolone cream (KENALOG) 0.1 %, Reported on 07/15/2015, Disp: , Rfl:   No Known Allergies   ROS  Constitutional: Negative for fever, positive for mild weight change.  Respiratory: Negative for cough and shortness of breath.   Cardiovascular: Negative for chest pain or palpitations.  Gastrointestinal:  Negative for abdominal pain, no bowel changes.  Musculoskeletal: Negative for gait problem or joint swelling.  Skin: Negative for rash.  Neurological: Negative for dizziness or headache.  No other specific complaints in a complete review of systems (except as listed in HPI above).  Objective  Vitals:   01/17/16 0838  BP: 124/64  Pulse: 82  Resp: 16  Temp: 98.3 F (36.8 C)  TempSrc: Oral  SpO2: 95%  Weight: 214 lb 9 oz (97.3 kg)  Height: '5\' 2"'  (1.575 m)    Body mass index is 39.24 kg/m.  Physical Exam  Constitutional: Patient appears well-developed and well-nourished. No distress.  HENT: Head: Normocephalic and atraumatic. Ears: B TMs ok, no erythema or effusion; Nose: Nose normal. Mouth/Throat: Oropharynx is clear and moist. No oropharyngeal exudate.  Eyes: Conjunctivae and EOM are normal. Pupils are equal, round, and reactive to light. No scleral icterus.  Neck: Normal range of motion. Neck supple. No JVD present. No thyromegaly present.  Cardiovascular: Normal rate, regular rhythm and normal heart sounds.  No murmur heard. No BLE edema. Pulmonary/Chest: Effort normal and breath sounds normal. No respiratory distress. Abdominal: Soft. Bowel sounds are normal, no distension. There is no tenderness. no masses Breast: no lumps or masses, no nipple discharge or rashes FEMALE GENITALIA:  External genitalia normal External urethra normal Pelvic not done RECTAL: not done Musculoskeletal: Normal range of motion, no joint effusions. No gross deformities Neurological: he is alert and oriented to person, place, and time. No cranial nerve deficit. Coordination, balance, strength, speech and gait are normal.  Skin: Skin is warm and dry. No rash noted. No erythema.  Psychiatric: Patient has a normal mood and affect. behavior is normal. Judgment and thought content normal.  Recent Results (from the past 2160 hour(s))  Lipid panel     Status: Abnormal   Collection Time: 11/15/15 11:50  AM  Result Value Ref Range   Cholesterol 112 (L) 125 - 200 mg/dL   Triglycerides 94 <150 mg/dL   HDL 58 >=46 mg/dL   Total CHOL/HDL Ratio 1.9 <=5.0 Ratio   VLDL 19 <30 mg/dL   LDL Cholesterol 35 <130 mg/dL    Comment:   Total Cholesterol/HDL Ratio:CHD Risk                        Coronary Heart Disease Risk Table                                        Men       Women          1/2 Average Risk              3.4        3.3  Average Risk              5.0        4.4           2X Average Risk              9.6        7.1           3X Average Risk             23.4       11.0 Use the calculated Patient Ratio above and the CHD Risk table  to determine the patient's CHD Risk.   COMPLETE METABOLIC PANEL WITH GFR     Status: Abnormal   Collection Time: 11/15/15 11:50 AM  Result Value Ref Range   Sodium 143 135 - 146 mmol/L   Potassium 4.0 3.5 - 5.3 mmol/L   Chloride 104 98 - 110 mmol/L   CO2 29 20 - 31 mmol/L   Glucose, Bld 94 65 - 99 mg/dL   BUN 16 7 - 25 mg/dL   Creat 0.83 0.50 - 0.99 mg/dL    Comment:   For patients > or = 65 years of age: The upper reference limit for Creatinine is approximately 13% higher for people identified as African-American.      Total Bilirubin 1.5 (H) 0.2 - 1.2 mg/dL   Alkaline Phosphatase 79 33 - 130 U/L   AST 15 10 - 35 U/L   ALT 12 6 - 29 U/L   Total Protein 7.0 6.1 - 8.1 g/dL   Albumin 4.2 3.6 - 5.1 g/dL   Calcium 9.7 8.6 - 10.4 mg/dL   GFR, Est African American 86 >=60 mL/min   GFR, Est Non African American 75 >=60 mL/min  Hemoglobin A1c     Status: Abnormal   Collection Time: 11/15/15 11:50 AM  Result Value Ref Range   Hgb A1c MFr Bld 5.9 (H) <5.7 %    Comment:   For someone without known diabetes, a hemoglobin A1c value between 5.7% and 6.4% is consistent with prediabetes and should be confirmed with a follow-up test.   For someone with known diabetes, a value <7% indicates that their diabetes is well controlled. A1c targets  should be individualized based on duration of diabetes, age, co-morbid conditions and other considerations.   This assay result is consistent with an increased risk of diabetes.   Currently, no consensus exists regarding use of hemoglobin A1c for diagnosis of diabetes in children.      Mean Plasma Glucose 123 mg/dL  Vitamin B12     Status: None   Collection Time: 11/15/15 11:50 AM  Result Value Ref Range   Vitamin B-12 1,064 200 - 1,100 pg/mL  VITAMIN D 25 Hydroxy (Vit-D Deficiency, Fractures)     Status: Abnormal   Collection Time: 11/15/15 11:50 AM  Result Value Ref Range   Vit D, 25-Hydroxy 27 (L) 30 - 100 ng/mL    Comment: Vitamin D Status           25-OH Vitamin D        Deficiency                <20 ng/mL        Insufficiency         20 - 29 ng/mL        Optimal             > or = 30 ng/mL   For  25-OH Vitamin D testing on patients on D2-supplementation and patients for whom quantitation of D2 and D3 fractions is required, the QuestAssureD 25-OH VIT D, (D2,D3), LC/MS/MS is recommended: order code [id number] (patients > 2 yrs).   TSH     Status: None   Collection Time: 11/15/15 11:50 AM  Result Value Ref Range   TSH 1.18 mIU/L    Comment:   Reference Range   > or = 20 Years  0.40-4.50   Pregnancy Range First trimester  0.26-2.66 Second trimester 0.55-2.73 Third trimester  0.43-2.91     CBC with Differential/Platelet     Status: None   Collection Time: 11/15/15 11:50 AM  Result Value Ref Range   WBC 9.3 3.8 - 10.8 K/uL   RBC 4.95 3.80 - 5.10 MIL/uL   Hemoglobin 13.4 11.7 - 15.5 g/dL   HCT 41.2 35.0 - 45.0 %   MCV 83.2 80.0 - 100.0 fL   MCH 27.1 27.0 - 33.0 pg   MCHC 32.5 32.0 - 36.0 g/dL   RDW 14.0 11.0 - 15.0 %   Platelets 367 140 - 400 K/uL   MPV 9.2 7.5 - 12.5 fL   Neutro Abs 6,510 1,500 - 7,800 cells/uL   Lymphs Abs 1,767 850 - 3,900 cells/uL   Monocytes Absolute 651 200 - 950 cells/uL   Eosinophils Absolute 372 15 - 500 cells/uL   Basophils Absolute 0 0  - 200 cells/uL   Neutrophils Relative % 70 %   Lymphocytes Relative 19 %   Monocytes Relative 7 %   Eosinophils Relative 4 %   Basophils Relative 0 %   Smear Review Criteria for review not met     Comment: ** Please note change in unit of measure and reference range(s). **      PHQ2/9: Depression screen [facility] 2/9 01/17/2016 12/20/2015 12/20/2015 11/15/2015 07/15/2015  Decreased Interest 0 1 0 0 0  Down, Depressed, Hopeless 0 1 0 0 0  PHQ - 2 Score 0 2 0 0 0  Altered sleeping 0 0 - - -  Tired, decreased energy 0 3 - - -  Change in appetite 0 0 - - -  Feeling bad or failure about yourself  0 0 - - -  Trouble concentrating 0 0 - - -  Moving slowly or fidgety/restless 0 0 - - -  Suicidal thoughts 0 0 - - -  PHQ-9 Score 0 5 - - -  Difficult doing work/chores - Somewhat difficult - - -     Fall Risk: Fall Risk  01/17/2016 12/20/2015 11/15/2015 07/15/2015 05/25/2015  Falls in the past year? Yes Yes No Yes No  Number falls in past yr: 1 1 - 2 or more -  Injury with Fall? No No - Yes -      Functional Status Survey: Is the patient deaf or have difficulty hearing?: No Does the patient have difficulty seeing, even when wearing glasses/contacts?: Yes Does the patient have difficulty concentrating, remembering, or making decisions?: No Does the patient have difficulty walking or climbing stairs?: No Does the patient have difficulty dressing or bathing?: No Does the patient have difficulty doing errands alone such as visiting a doctor's office or shopping?: No    Assessment & Plan   1. Well woman exam  Discussed importance of 150 minutes of physical activity weekly, eat two servings of fish weekly, eat one serving of tree nuts ( cashews, pistachios, pecans, almonds.[employer] Kitchen) every other day, eat 6 servings of fruit/vegetables daily and drink plenty  of water and avoid sweet beverages.  - glucose blood (ONE TOUCH ULTRA TEST) test strip; Use as instructed  Dispense: 100 each; Refill: 12  2. Needs  flu shot  - Flu Vaccine QUAD 36+ mos PF IM (Fluarix & Fluzone Quad PF)  3. Colon cancer screening  - Ambulatory referral to Gastroenterology  4. Breast cancer screening  - MM Digital Screening; Future

## 2016-01-19 ENCOUNTER — Encounter: Payer: Self-pay | Admitting: *Deleted

## 2016-01-31 ENCOUNTER — Encounter: Payer: Self-pay | Admitting: *Deleted

## 2016-02-07 ENCOUNTER — Encounter: Payer: Self-pay | Admitting: General Surgery

## 2016-02-07 ENCOUNTER — Ambulatory Visit (INDEPENDENT_AMBULATORY_CARE_PROVIDER_SITE_OTHER): Payer: BC Managed Care – PPO | Admitting: General Surgery

## 2016-02-07 VITALS — BP 140/72 | HR 78 | Resp 12 | Ht 62.0 in | Wt 216.0 lb

## 2016-02-07 DIAGNOSIS — Z1211 Encounter for screening for malignant neoplasm of colon: Secondary | ICD-10-CM

## 2016-02-07 MED ORDER — POLYETHYLENE GLYCOL 3350 17 GM/SCOOP PO POWD: 1.0000 | Freq: Once | ORAL | 0 refills | Status: AC

## 2016-02-07 NOTE — Progress Notes (Signed)
Patient ID: Monique Baker, female   DOB: 1950/09/30, 65 y.o.   MRN: [id number]  Chief Complaint  Patient presents with  . Colonoscopy    HPI Monique Baker is a 65 y.o. female here today for an evaluation for a screening colonoscopy. Denies GI issues and bleeding. Moves bowels daily. Her last one was in 2007.  I have reviewed the history of present illness with the patient.  HPI  Past Medical History:  Diagnosis Date  . Allergy   . Depression   . GERD (gastroesophageal reflux disease)   . Hyperlipidemia   . Hypertension   . Internal hemorrhoids   . Menopausal disorder   . Osteoarthritis    hands  . Prediabetes   . Wears dentures    partial upper    Past Surgical History:  Procedure Laterality Date  . ABDOMINAL HYSTERECTOMY    . CESAREAN SECTION    . HALLUX VALGUS LAPIDUS Left 06/16/2015   Procedure: HALLUX VALGUS LAPIDUS;  Surgeon: Gwyneth Revels, DPM;  Location: [facility];  Service: Podiatry;  Laterality: Left;  WITH POPLITEAL  . HAMMER TOE SURGERY Left 06/16/2015   Procedure: HAMMER TOE CORRECTION SECOND TOE LEFT;  Surgeon: Gwyneth Revels, DPM;  Location: [facility];  Service: Podiatry;  Laterality: Left;  prediabetic - on oral meds  . MYOMECTOMY    . trigger finger Right 12/15/2014  . Vulva cyst  11/2006    Family History  Problem Relation Age of Onset  . Dementia Mother   . Diabetes Father   . Cancer Daughter     Fibroid Tumors    Social History Social History  Substance Use Topics  . Smoking status: Never Smoker  . Smokeless tobacco: Never Used  . Alcohol use No    No Known Allergies  Current Outpatient Prescriptions  Medication Sig Dispense Refill  . acetaminophen (TYLENOL) 500 MG tablet Take by mouth. Reported on 11/15/2015    . Amlodipine-Valsartan-HCTZ 5-160-12.5 MG TABS TAKE 1 TABLET BY MOUTH DAILY. 90 tablet 1  . atorvastatin (LIPITOR) 40 MG tablet Take 1 tablet (40 mg total) by mouth every evening. for cholesterol 90 tablet 1  .  Cyanocobalamin (B-12) 1000 MCG SUBL Place under the tongue.    . DULoxetine (CYMBALTA) 60 MG capsule Take 1 capsule (60 mg total) by mouth daily. 30 capsule 2  . fluticasone (FLONASE) 50 MCG/ACT nasal spray Place 2 sprays into both nostrils daily. 48 g 0  . glucose blood (ONE TOUCH ULTRA TEST) test strip Use as instructed 100 each 12  . loratadine (CLARITIN) 10 MG tablet TAKE 1 TABLET (10 MG TOTAL) BY MOUTH DAILY. 90 tablet 1  . metFORMIN (GLUCOPHAGE) 500 MG tablet Take 1 tablet (500 mg total) by mouth 2 (two) times daily. 180 tablet 1  . nadolol (CORGARD) 20 MG tablet Take 1 tablet (20 mg total) by mouth every evening. 90 tablet 1  . triamcinolone cream (KENALOG) 0.1 % Reported on 07/15/2015    . polyethylene glycol powder (GLYCOLAX/MIRALAX) powder Take 255 g by mouth once. Mix whole container with 64 ounces of clear liquids 255 g 0   No current facility-administered medications for this visit.     Review of Systems Review of Systems  Constitutional: Negative.   Respiratory: Negative.   Cardiovascular: Negative.   Gastrointestinal: Negative.     Blood pressure 140/72, pulse 78, resp. rate 12, height 5\' 2"  (1.575 m), weight 216 lb (98 kg).  Physical Exam Physical Exam  Constitutional: She is  oriented to person, place, and time. She appears well-developed and well-nourished.  Eyes: Conjunctivae are normal. No scleral icterus.  Neck: Neck supple.  Cardiovascular: Normal rate, regular rhythm and normal heart sounds.   Pulmonary/Chest: Effort normal and breath sounds normal.  Abdominal: Soft. Bowel sounds are normal. She exhibits no distension. There is no tenderness.  Lymphadenopathy:    She has no cervical adenopathy.  Neurological: She is alert and oriented to person, place, and time.  Skin: Skin is warm and dry.    Data Reviewed Notes and colonoscopy reviewed   Assessment    Stable exam. Due for screening colonoscopy    Plan       Colonoscopy with possible  biopsy/polypectomy prn: Information regarding the procedure, including its potential risks and complications (including but not limited to perforation of the bowel, which may require emergency surgery to repair, and bleeding) was verbally given to the patient. Educational information regarding lower intestinal endoscopy was given to the patient. Written instructions for how to complete the bowel prep using Miralax were provided. The importance of drinking ample fluids to avoid dehydration as a result of the prep emphasized.  The patient is scheduled for a Colonoscopy at [facility] on 03/15/16. They are aware to call the day before to get their arrival time. She will hold her Metformin the day of prep and procedure. She may continue to take her 81 mg Aspirin. She will only take her blood pressure medication at 6 am with a small sip of water the morning of. Miralax prescription has been sent into the patient's pharmacy. The patient is aware of date and instructions.    This information has been scribed by Milas Kocherebeca Morris, CMA    Amaree Loisel G 02/07/2016, 11:04 AM

## 2016-02-07 NOTE — Patient Instructions (Addendum)
Colonoscopy A colonoscopy is an exam to look at the entire large intestine (colon). This exam can help find problems such as tumors, polyps, inflammation, and areas of bleeding. The exam takes about 1 hour.  LET [facility] PROVIDER KNOW ABOUT:   Any allergies you have.  All medicines you are taking, including vitamins, herbs, eye drops, creams, and over-the-counter medicines.  Previous problems you or members of your family have had with the use of anesthetics.  Any blood disorders you have.  Previous surgeries you have had.  Medical conditions you have. RISKS AND COMPLICATIONS  Generally, this is a safe procedure. However, as with any procedure, complications can occur. Possible complications include:  Bleeding.  Tearing or rupture of the colon wall.  Reaction to medicines given during the exam.  Infection (rare). BEFORE THE PROCEDURE   Ask your health care provider about changing or stopping your regular medicines.  You may be prescribed an oral bowel prep. This involves drinking a large amount of medicated liquid, starting the day before your procedure. The liquid will cause you to have multiple loose stools until your stool is almost clear or light green. This cleans out your colon in preparation for the procedure.  Do not eat or drink anything else once you have started the bowel prep, unless your health care provider tells you it is safe to do so.  Arrange for someone to drive you home after the procedure. PROCEDURE   You will be given medicine to help you relax (sedative).  You will lie on your side with your knees bent.  A long, flexible tube with a light and camera on the end (colonoscope) will be inserted through the rectum and into the colon. The camera sends video back to a computer screen as it moves through the colon. The colonoscope also releases carbon dioxide gas to inflate the colon. This helps your health care provider see the area better.  During  the exam, your health care provider may take a small tissue sample (biopsy) to be examined under a microscope if any abnormalities are found.  The exam is finished when the entire colon has been viewed. AFTER THE PROCEDURE   Do not drive for 24 hours after the exam.  You may have a small amount of blood in your stool.  You may pass moderate amounts of gas and have mild abdominal cramping or bloating. This is caused by the gas used to inflate your colon during the exam.  Ask when your test results will be ready and how you will get your results. Make sure you get your test results.   This information is not intended to replace advice given to you by your health care provider. Make sure you discuss any questions you have with your health care provider.   Document Released: 04/14/2000 Document Revised: 02/05/2013 Document Reviewed: 12/23/2012 Elsevier Interactive Patient Education [employer]2016 Elsevier Inc.  The patient is scheduled for a Colonoscopy at [facility] on 03/15/16. They are aware to call the day before to get their arrival time. She will hold her Metformin the day of prep and procedure. She may continue to take her 81 mg Aspirin. She will only take her blood pressure medication at 6 am with a small sip of water the morning of. Miralax prescription has been sent into the patient's pharmacy. The patient is aware of date and instructions.

## 2016-02-22 ENCOUNTER — Encounter: Payer: Self-pay | Admitting: Family Medicine

## 2016-02-22 ENCOUNTER — Ambulatory Visit (INDEPENDENT_AMBULATORY_CARE_PROVIDER_SITE_OTHER): Payer: BC Managed Care – PPO | Admitting: Family Medicine

## 2016-02-22 VITALS — BP 124/68 | HR 88 | Temp 98.8°F | Resp 16 | Ht 62.0 in | Wt 217.2 lb

## 2016-02-22 DIAGNOSIS — E785 Hyperlipidemia, unspecified: Secondary | ICD-10-CM

## 2016-02-22 DIAGNOSIS — F330 Major depressive disorder, recurrent, mild: Secondary | ICD-10-CM | POA: Diagnosis not present

## 2016-02-22 DIAGNOSIS — E8881 Metabolic syndrome: Secondary | ICD-10-CM

## 2016-02-22 DIAGNOSIS — I10 Essential (primary) hypertension: Secondary | ICD-10-CM

## 2016-02-22 DIAGNOSIS — M25551 Pain in right hip: Secondary | ICD-10-CM | POA: Diagnosis not present

## 2016-02-22 DIAGNOSIS — E538 Deficiency of other specified B group vitamins: Secondary | ICD-10-CM | POA: Diagnosis not present

## 2016-02-22 DIAGNOSIS — F411 Generalized anxiety disorder: Secondary | ICD-10-CM

## 2016-02-22 MED ORDER — DULOXETINE HCL 60 MG PO CPEP: 60.0000 mg | ORAL_CAPSULE | Freq: Every day | ORAL | 1 refills | Status: DC

## 2016-02-22 MED ORDER — GLUCOSE BLOOD VI STRP: ORAL_STRIP | 12 refills | Status: AC

## 2016-02-22 NOTE — Progress Notes (Signed)
Name: Monique Baker   MRN: [id number]    DOB: Sep 03, 1950   Date:02/22/2016       Progress Note  Subjective  Chief Complaint  Chief Complaint  Patient presents with  . Depression    pt stated that the medication has been helping and that she notices a small difference  . Hypertension    pt stated that she checks when she goes to [facility] and that it has been stable    HPI   Prediabetes/metabolic syndrome: hgbA1C is at goal, last level in July was 5.9%,   taking Metformin and is down to one pill daily, denies polyphagia, polyuria or polydipsia.   HTN: taking bp medication, denies side effects, no chest pain, no palpitation. She is complaint with medication. BP is at goal   History of hammer toe and bunion repair of left foot: done in Feb 2017 by Dr. Ether GriffinsFowler . She is wearing regular shoes and is feeling better, but not back to normal, still some soreness when walking for a long time  Right hip pain: seen by Dr. Hyacinth MeekerMiller, taking Meloxicam prn, but not as often now.. She states that since left foot surgery, the right hip has not caused any problems. She has done more hip exercises and is doing better. Worse when sleeping on right side  Obesity: she had lost some weight, but gained 1 lbs since last visit. She states that they recently celebrated her anniversary had more sweets lately.   Depression Major: she is married to a Education officer, environmentalpastor, he also works as a Airline pilotprofessor at [employer]. She is only baby-sitting grandchildren prn and that is not as stressful now. . We started her on Cymbalta 10/2015 and is feeling better no longer has anhedonia, motivation is back, no longer having crying spells, and not feeling so hungry. Asking church members to help her out - delegating responsibilities. She feels like her husband is always the focus of attention, advised her to discuss her feelings with him, and she said speaking to him has helped a little. GAD: discussed symptoms, she is feeling better.   Patient  Active Problem List   Diagnosis Date Noted  . History of bunionectomy of left great toe 07/15/2015  . H/O hammer toe correction 03/15/2015  . Benign essential HTN 11/05/2014  . Carpal tunnel syndrome 11/05/2014  . Osteoarthritis 11/05/2014  . Dermatitis, eczematoid 11/05/2014  . Depression, major, recurrent, mild (HCC) 11/05/2014  . Dyslipidemia 11/05/2014  . Gastro-esophageal reflux disease without esophagitis 11/05/2014  . Extreme obesity (HCC) 11/05/2014  . Dysmetabolic syndrome 11/05/2014  . Menopausal and perimenopausal disorder 11/05/2014  . Perennial allergic rhinitis with seasonal variation 11/05/2014  . Seborrhea capitis 11/05/2014  . Acquired trigger finger 11/05/2014  . Urinary incontinence in female 11/05/2014  . Vitamin D deficiency 11/05/2014  . Blood glucose elevated 08/31/2009    Past Surgical History:  Procedure Laterality Date  . ABDOMINAL HYSTERECTOMY    . CESAREAN SECTION    . HALLUX VALGUS LAPIDUS Left 06/16/2015   Procedure: HALLUX VALGUS LAPIDUS;  Surgeon: Gwyneth RevelsJustin Fowler, DPM;  [facility];  Service: Podiatry;  Laterality: Left;  WITH POPLITEAL  . HAMMER TOE SURGERY Left 06/16/2015   Procedure: HAMMER TOE CORRECTION SECOND TOE LEFT;  Surgeon: Gwyneth RevelsJustin Fowler, DPM;  [facility];  Service: Podiatry;  Laterality: Left;  prediabetic - on oral meds  . MYOMECTOMY    . trigger finger Right 12/15/2014  . Vulva cyst  11/2006    Family History  Problem Relation Age of Onset  . Dementia Mother   . Diabetes Father   . Cancer Daughter     Fibroid Tumors    Social History   Social History  . Marital status: Married    Spouse name: N/A  . Number of children: N/A  . Years of education: N/A   Occupational History  . Not on file.   Social History Main Topics  . Smoking status: Never Smoker  . Smokeless tobacco: Never Used  . Alcohol use No  . Drug use: No  . Sexual activity: Yes    Partners: Male   Other Topics Concern   . Not on file   Social History Narrative  . No narrative on file     Current Outpatient Prescriptions:  .  acetaminophen (TYLENOL) 500 MG tablet, Take by mouth. Reported on 11/15/2015, Disp: , Rfl:  .  Amlodipine-Valsartan-HCTZ 5-160-12.5 MG TABS, TAKE 1 TABLET BY MOUTH DAILY., Disp: 90 tablet, Rfl: 1 .  atorvastatin (LIPITOR) 40 MG tablet, Take 1 tablet (40 mg total) by mouth every evening. for cholesterol, Disp: 90 tablet, Rfl: 1 .  Cyanocobalamin (B-12) 1000 MCG SUBL, Place under the tongue., Disp: , Rfl:  .  DULoxetine (CYMBALTA) 60 MG capsule, Take 1 capsule (60 mg total) by mouth daily., Disp: 30 capsule, Rfl: 2 .  fluticasone (FLONASE) 50 MCG/ACT nasal spray, Place 2 sprays into both nostrils daily., Disp: 48 g, Rfl: 0 .  glucose blood (ONETOUCH VERIO) test strip, Use as instructed, Disp: 100 each, Rfl: 12 .  loratadine (CLARITIN) 10 MG tablet, TAKE 1 TABLET (10 MG TOTAL) BY MOUTH DAILY., Disp: 90 tablet, Rfl: 1 .  metFORMIN (GLUCOPHAGE) 500 MG tablet, Take 1 tablet (500 mg total) by mouth 2 (two) times daily., Disp: 180 tablet, Rfl: 1 .  nadolol (CORGARD) 20 MG tablet, Take 1 tablet (20 mg total) by mouth every evening., Disp: 90 tablet, Rfl: 1 .  triamcinolone cream (KENALOG) 0.1 %, Reported on 07/15/2015, Disp: , Rfl:   No Known Allergies   ROS  Constitutional: Negative for fever or weight change.  Respiratory: Negative for cough and shortness of breath.   Cardiovascular: Negative for chest pain or palpitations.  Gastrointestinal: Negative for abdominal pain, no bowel changes.  Musculoskeletal: Negative for gait problem or joint swelling.  Skin: Negative for rash.  Neurological: Negative for dizziness or headache.  No other specific complaints in a complete review of systems (except as listed in HPI above).  Objective  Vitals:   02/22/16 0850  BP: 124/68  Pulse: 88  Resp: 16  Temp: 98.8 F (37.1 C)  TempSrc: Oral  SpO2: 96%  Weight: 217 lb 4 oz (98.5 kg)   Height: 5\' 2"  (1.575 m)    Body mass index is 39.74 kg/m.  Physical Exam  Constitutional: Patient appears well-developed and well-nourished. Obese  No distress.  HEENT: head atraumatic, normocephalic, pupils equal and reactive to light, neck supple, throat within normal limits Cardiovascular: Normal rate, regular rhythm and normal heart sounds.  No murmur heard. No BLE edema. Pulmonary/Chest: Effort normal and breath sounds normal. No respiratory distress. Abdominal: Soft.  There is no tenderness. Psychiatric: Patient has a normal mood and affect. behavior is normal. Judgment and thought content normal.   Recent Results (from the past 2160 hour(s))  POCT Glucose (CBG)     Status: Abnormal   Collection Time: 01/17/16  9:23 AM  Result Value Ref Range   POC Glucose 106 (A) 70 - 99 mg/dl  PHQ2/9: Depression screen [facility] 2/9 02/22/2016 01/17/2016 12/20/2015 12/20/2015 11/15/2015  Decreased Interest 1 0 1 0 0  Down, Depressed, Hopeless 1 0 1 0 0  PHQ - 2 Score 2 0 2 0 0  Altered sleeping 0 0 0 - -  Tired, decreased energy 0 0 3 - -  Change in appetite 0 0 0 - -  Feeling bad or failure about yourself  0 0 0 - -  Trouble concentrating 0 0 0 - -  Moving slowly or fidgety/restless 0 0 0 - -  Suicidal thoughts 0 0 0 - -  PHQ-9 Score 2 0 5 - -  Difficult doing work/chores Not difficult at all - Somewhat difficult - -     Fall Risk: Fall Risk  02/22/2016 01/17/2016 12/20/2015 11/15/2015 07/15/2015  Falls in the past year? Yes Yes Yes No Yes  Number falls in past yr: 1 1 1  - 2 or more  Injury with Fall? No No No - Yes  Follow up Falls evaluation completed - - - -     Functional Status Survey: Is the patient deaf or have difficulty hearing?: No Does the patient have difficulty seeing, even when wearing glasses/contacts?: No Does the patient have difficulty concentrating, remembering, or making decisions?: No Does the patient have difficulty walking or climbing stairs?: No Does  the patient have difficulty dressing or bathing?: No Does the patient have difficulty doing errands alone such as visiting a doctor's office or shopping?: No   Assessment & Plan  1. Depression, major, recurrent, mild (HCC)  - DULoxetine (CYMBALTA) 60 MG capsule; Take 1 capsule (60 mg total) by mouth daily.  Dispense: 90 capsule; Refill: 1  2. GAD (generalized anxiety disorder)  - DULoxetine (CYMBALTA) 60 MG capsule; Take 1 capsule (60 mg total) by mouth daily.  Dispense: 90 capsule; Refill: 1  3. Dysmetabolic syndrome  Doing well, continue Metformin   4. Morbid obesity due to excess calories [facility])  Discussed with the patient the risk posed by an increased BMI. Discussed importance of portion control, calorie counting and at least 150 minutes of physical activity weekly. Avoid sweet beverages and drink more water. Eat at least 6 servings of fruit and vegetables daily   5. Benign essential HTN  Well controlled  6. Dyslipidemia  Continue medication  7. Right hip pain  Advised to take Meloxicam prn  8. B12 deficiency  Discussed SL supplementation

## 2016-03-02 ENCOUNTER — Other Ambulatory Visit: Payer: Self-pay | Admitting: General Surgery

## 2016-03-14 ENCOUNTER — Encounter: Payer: Self-pay | Admitting: *Deleted

## 2016-03-15 ENCOUNTER — Ambulatory Visit: Payer: BC Managed Care – PPO | Admitting: Anesthesiology

## 2016-03-15 ENCOUNTER — Ambulatory Visit
Admission: RE | Admit: 2016-03-15 | Discharge: 2016-03-15 | Disposition: A | Discharge status: Home / Self Care | Payer: BC Managed Care – PPO | Source: Ambulatory Visit | Attending: General Surgery | Admitting: General Surgery

## 2016-03-15 ENCOUNTER — Encounter
Admission: RE | Disposition: A | Discharge status: Home / Self Care | Payer: Self-pay | Source: Ambulatory Visit | Attending: General Surgery

## 2016-03-15 DIAGNOSIS — E785 Hyperlipidemia, unspecified: Secondary | ICD-10-CM | POA: Insufficient documentation

## 2016-03-15 DIAGNOSIS — F329 Major depressive disorder, single episode, unspecified: Secondary | ICD-10-CM | POA: Insufficient documentation

## 2016-03-15 DIAGNOSIS — K5730 Diverticulosis of large intestine without perforation or abscess without bleeding: Secondary | ICD-10-CM | POA: Diagnosis not present

## 2016-03-15 DIAGNOSIS — K644 Residual hemorrhoidal skin tags: Secondary | ICD-10-CM | POA: Insufficient documentation

## 2016-03-15 DIAGNOSIS — Z7984 Long term (current) use of oral hypoglycemic drugs: Secondary | ICD-10-CM | POA: Insufficient documentation

## 2016-03-15 DIAGNOSIS — Z1211 Encounter for screening for malignant neoplasm of colon: Secondary | ICD-10-CM | POA: Insufficient documentation

## 2016-03-15 DIAGNOSIS — K219 Gastro-esophageal reflux disease without esophagitis: Secondary | ICD-10-CM | POA: Insufficient documentation

## 2016-03-15 DIAGNOSIS — Z7951 Long term (current) use of inhaled steroids: Secondary | ICD-10-CM | POA: Diagnosis not present

## 2016-03-15 DIAGNOSIS — M19042 Primary osteoarthritis, left hand: Secondary | ICD-10-CM | POA: Diagnosis not present

## 2016-03-15 DIAGNOSIS — I10 Essential (primary) hypertension: Secondary | ICD-10-CM | POA: Insufficient documentation

## 2016-03-15 DIAGNOSIS — Z79899 Other long term (current) drug therapy: Secondary | ICD-10-CM | POA: Diagnosis not present

## 2016-03-15 DIAGNOSIS — R7303 Prediabetes: Secondary | ICD-10-CM | POA: Insufficient documentation

## 2016-03-15 DIAGNOSIS — M19041 Primary osteoarthritis, right hand: Secondary | ICD-10-CM | POA: Diagnosis not present

## 2016-03-15 HISTORY — PX: COLONOSCOPY WITH PROPOFOL: SHX5780

## 2016-03-15 LAB — GLUCOSE, CAPILLARY: GLUCOSE-CAPILLARY: 110 mg/dL — AB (ref 65–99)

## 2016-03-15 SURGERY — COLONOSCOPY WITH PROPOFOL
Anesthesia: General

## 2016-03-15 MED ORDER — SODIUM CHLORIDE 0.9 % IV SOLN
INTRAVENOUS | Status: DC | PRN
  Administered 2016-03-15: 08:00:00 via INTRAVENOUS

## 2016-03-15 MED ORDER — PHENYLEPHRINE HCL 10 MG/ML IJ SOLN
INTRAMUSCULAR | Status: DC | PRN
  Administered 2016-03-15 (×2): 100 ug via INTRAVENOUS

## 2016-03-15 MED ORDER — LIDOCAINE HCL (CARDIAC) 20 MG/ML IV SOLN
INTRAVENOUS | Status: DC | PRN
  Administered 2016-03-15: 60 mg via INTRAVENOUS

## 2016-03-15 MED ORDER — SODIUM CHLORIDE 0.9 % IV SOLN
Freq: Once | INTRAVENOUS | Status: AC
  Administered 2016-03-15: 08:00:00 via INTRAVENOUS

## 2016-03-15 MED ORDER — PROPOFOL 10 MG/ML IV BOLUS
INTRAVENOUS | Status: DC | PRN
  Administered 2016-03-15: 50 mg via INTRAVENOUS

## 2016-03-15 MED ORDER — PROPOFOL 500 MG/50ML IV EMUL
INTRAVENOUS | Status: DC | PRN
  Administered 2016-03-15: 120 ug/kg/min via INTRAVENOUS

## 2016-03-15 NOTE — Interval H&P Note (Signed)
History and Physical Interval Note:  03/15/2016 7:50 AM  Dayton ScrapeLinda C Kruczek  has presented today for surgery, with the diagnosis of SCREENING  The various methods of treatment have been discussed with the patient and family. After consideration of risks, benefits and other options for treatment, the patient has consented to  Procedure(s): COLONOSCOPY WITH PROPOFOL (N/A) as a surgical intervention .  The patient's history has been reviewed, patient examined, no change in status, stable for surgery.  I have reviewed the patient's chart and labs.  Questions were answered to the patient's satisfaction.     Chellsie Gomer G

## 2016-03-15 NOTE — Anesthesia Preprocedure Evaluation (Addendum)
Anesthesia Evaluation  Patient identified by MRN, date of birth, ID band  Reviewed: Allergy & Precautions, H&P , NPO status , Patient's Chart, lab work & pertinent test results, reviewed documented beta blocker date and time   Airway Mallampati: III  TM Distance: >3 FB Neck ROM: full    Dental no notable dental hx. (+) Partial Upper   Pulmonary    Pulmonary exam normal        Cardiovascular hypertension, Pt. on medications and Pt. on home beta blockers  Rhythm:regular Rate:Normal     Neuro/Psych PSYCHIATRIC DISORDERS Depression  Neuromuscular disease    GI/Hepatic Neg liver ROS, GERD  Medicated,  Endo/Other  negative endocrine ROS  Renal/GU negative Renal ROS  negative genitourinary   Musculoskeletal  (+) Arthritis , Osteoarthritis,    Abdominal   Peds negative pediatric ROS (+)  Hematology negative hematology ROS (+)   Anesthesia Other Findings Past Medical History: No date: Allergy No date: Depression No date: GERD (gastroesophageal reflux disease) No date: Hyperlipidemia No date: Hypertension No date: Internal hemorrhoids No date: Menopausal disorder No date: Osteoarthritis     Comment: hands No date: Prediabetes No date: Wears dentures     Comment: partial upper  Reproductive/Obstetrics                            Anesthesia Physical  Anesthesia Plan  ASA: II  Anesthesia Plan: General   Post-op Pain Management: GA combined w/ Regional for post-op pain   Induction: Intravenous  Airway Management Planned:   Additional Equipment:   Intra-op Plan:   Post-operative Plan:   Informed Consent: I have reviewed the patients History and Physical, chart, labs and discussed the procedure including the risks, benefits and alternatives for the proposed anesthesia with the patient or authorized representative who has indicated his/her understanding and acceptance.     Plan  Discussed with: CRNA  Anesthesia Plan Comments:        Anesthesia Quick Evaluation

## 2016-03-15 NOTE — Anesthesia Postprocedure Evaluation (Signed)
Anesthesia Post Note  Patient: Monique ScrapeLinda C Baker  Procedure(s) Performed: Procedure(s) (LRB): COLONOSCOPY WITH PROPOFOL (N/A)  Patient location during evaluation: PACU Anesthesia Type: General Level of consciousness: awake and alert and oriented Pain management: pain level controlled Vital Signs Assessment: post-procedure vital signs reviewed and stable Respiratory status: spontaneous breathing Cardiovascular status: blood pressure returned to baseline Anesthetic complications: no    Last Vitals:  Vitals:   03/15/16 0820 03/15/16 0830  BP: (!) 102/47 105/81  Pulse: 69   Resp: 18   Temp: 36.1 C     Last Pain:  Vitals:   03/15/16 0820  TempSrc: Tympanic                 Sarahmarie Leavey

## 2016-03-15 NOTE — Transfer of Care (Signed)
Immediate Anesthesia Transfer of Care Note  Patient: Monique ScrapeLinda C Dupee  Procedure(s) Performed: Procedure(s): COLONOSCOPY WITH PROPOFOL (N/A)  Patient Location: Endoscopy Unit  Anesthesia Type:General  Level of Consciousness: awake, alert , oriented and patient cooperative  Airway & Oxygen Therapy: Patient Spontanous Breathing and Patient connected to nasal cannula oxygen  Post-op Assessment: Report given to RN, Post -op Vital signs reviewed and stable and Patient moving all extremities X 4  Post vital signs: Reviewed and stable  Last Vitals:  Vitals:   03/15/16 0734  BP: (!) 149/90  Pulse: 73  Resp: 18  Temp: 36.6 C    Last Pain:  Vitals:   03/15/16 0734  TempSrc: Oral         Complications: No apparent anesthesia complications

## 2016-03-15 NOTE — H&P (Signed)
Monique Baker is an 65 y.o. female.   Chief Complaint: here for colonoscopy HPI: 65 yr old female in good health. No GI complaints. Last colonoscopy in 2007 was normal.  Past Medical History:  Diagnosis Date  . Allergy   . Depression   . GERD (gastroesophageal reflux disease)   . Hyperlipidemia   . Hypertension   . Internal hemorrhoids   . Menopausal disorder   . Osteoarthritis    hands  . Prediabetes   . Wears dentures    partial upper    Past Surgical History:  Procedure Laterality Date  . ABDOMINAL HYSTERECTOMY    . CESAREAN SECTION    . HALLUX VALGUS LAPIDUS Left 06/16/2015   Procedure: HALLUX VALGUS LAPIDUS;  Surgeon: Gwyneth RevelsJustin Fowler, DPM;  [facility];  Service: Podiatry;  Laterality: Left;  WITH POPLITEAL  . HAMMER TOE SURGERY Left 06/16/2015   Procedure: HAMMER TOE CORRECTION SECOND TOE LEFT;  Surgeon: Gwyneth RevelsJustin Fowler, DPM;  [facility];  Service: Podiatry;  Laterality: Left;  prediabetic - on oral meds  . MYOMECTOMY    . trigger finger Right 12/15/2014  . Vulva cyst  11/2006    Family History  Problem Relation Age of Onset  . Dementia Mother   . Diabetes Father   . Cancer Daughter     Fibroid Tumors   Social History:  reports that she has never smoked. She has never used smokeless tobacco. She reports that she does not drink alcohol or use drugs.  Allergies: No Known Allergies  Medications Prior to Admission  Medication Sig Dispense Refill  . acetaminophen (TYLENOL) 500 MG tablet Take by mouth. Reported on 11/15/2015    . Amlodipine-Valsartan-HCTZ 5-160-12.5 MG TABS TAKE 1 TABLET BY MOUTH DAILY. 90 tablet 1  . atorvastatin (LIPITOR) 40 MG tablet Take 1 tablet (40 mg total) by mouth every evening. for cholesterol 90 tablet 1  . Cyanocobalamin (B-12) 1000 MCG SUBL Place under the tongue.    . DULoxetine (CYMBALTA) 60 MG capsule Take 1 capsule (60 mg total) by mouth daily. 90 capsule 1  . fluticasone (FLONASE) 50 MCG/ACT nasal spray  Place 2 sprays into both nostrils daily. 48 g 0  . loratadine (CLARITIN) 10 MG tablet TAKE 1 TABLET (10 MG TOTAL) BY MOUTH DAILY. 90 tablet 1  . metFORMIN (GLUCOPHAGE) 500 MG tablet Take 1 tablet (500 mg total) by mouth 2 (two) times daily. 180 tablet 1  . nadolol (CORGARD) 20 MG tablet Take 1 tablet (20 mg total) by mouth every evening. 90 tablet 1  . triamcinolone cream (KENALOG) 0.1 % Reported on 07/15/2015    . glucose blood (ONETOUCH VERIO) test strip Use as instructed 100 each 12    Results for orders placed or performed during the hospital encounter of 03/15/16 (from the past 48 hour(s))  Glucose, capillary     Status: Abnormal   Collection Time: 03/15/16  7:33 AM  Result Value Ref Range   Glucose-Capillary 110 (H) 65 - 99 mg/dL   No results found.  Review of Systems  Constitutional: Negative.   Respiratory: Negative.   Cardiovascular: Negative.   Gastrointestinal: Negative.   Genitourinary: Negative.     Blood pressure (!) 149/90, pulse 73, temperature 97.9 F (36.6 C), temperature source Oral, resp. rate 18, height 5\' 2"  (1.575 m), weight 216 lb (98 kg), SpO2 100 %. Physical Exam  Constitutional: She is oriented to person, place, and time. She appears well-developed and well-nourished.  Eyes: Conjunctivae are normal.  No scleral icterus.  Neck: Neck supple.  Cardiovascular: Normal rate, regular rhythm and normal heart sounds.   Respiratory: Effort normal and breath sounds normal.  GI: Soft. Bowel sounds are normal. She exhibits no distension and no mass. There is no tenderness.  Lymphadenopathy:    She has no cervical adenopathy.  Neurological: She is alert and oriented to person, place, and time.  Skin: Skin is warm and dry.     Assessment/Plan Pt in good health, normal exam. Average risk for colon malignancy. Proceed with screening colonoscopy  Kieth BrightlySANKAR,SEEPLAPUTHUR G, MD 03/15/2016, 7:48 AM

## 2016-03-15 NOTE — Op Note (Signed)
[facility] Gastroenterology Patient Name: Monique HoraLinda Baker Procedure Date: 03/15/2016 7:01 AM MRN: [id number] Account #: [id number] Date of Birth: 1950/12/20 Admit Type: Outpatient Age: 65 [facility] ROOM 1 Gender: Female Note Status: Finalized Procedure:            Colonoscopy Indications:          Screening for colorectal malignant neoplasm Providers:            Shariyah Eland G. Evette CristalSankar, MD Referring MD:         Onnie BoerKrichna F. Sowles, MD (Referring MD) Medicines:            General Anesthesia Complications:        No immediate complications. Procedure:            Pre-Anesthesia Assessment:                       - General anesthesia under the supervision of an                        anesthesiologist was determined to be medically                        necessary for this procedure based on review of the                        patient's medical history, medications, and prior                        anesthesia history.                       After obtaining informed consent, the colonoscope was                        passed under direct vision. Throughout the procedure,                        the patient's blood pressure, pulse, and oxygen                        saturations were monitored continuously. The                        Colonoscope was introduced through the anus and                        advanced to the the cecum, identified by the ileocecal                        valve. The colonoscopy was performed without                        difficulty. The patient tolerated the procedure well.                        The quality of the bowel preparation was excellent. Findings:      The perianal exam findings include non-thrombosed external hemorrhoids.      Many small and large-mouthed diverticula were found in the entire colon.      The exam was otherwise without abnormality. Impression:           -  Non-thrombosed external hemorrhoids found on perianal             exam.                       - Diverticulosis in the entire examined colon.                       - The examination was otherwise normal.                       - No specimens collected. Recommendation:       - Discharge patient to home.                       - Resume previous diet.                       - Continue present medications. Procedure Code(s):    --- Professional ---                       [id number], Colonoscopy, flexible; diagnostic, including                        collection of specimen(s) by brushing or washing, when                        performed (separate procedure) Diagnosis Code(s):    --- Professional ---                       Z12.11, Encounter for screening for malignant neoplasm                        of colon                       K64.4, Residual hemorrhoidal skin tags                       K57.30, Diverticulosis of large intestine without                        perforation or abscess without bleeding CPT copyright 2016 [employer] Association. All rights reserved. The codes documented in this report are preliminary and upon coder review may  be revised to meet current compliance requirements. Kieth BrightlySeeplaputhur G Burna Atlas, MD 03/15/2016 8:20:43 AM This report has been signed electronically. Number of Addenda: 0 Note Initiated On: 03/15/2016 7:01 AM Scope Withdrawal Time: 0 hours 7 minutes 4 seconds  Total Procedure Duration: 0 hours 13 minutes 22 seconds       [facility]

## 2016-03-16 ENCOUNTER — Encounter: Payer: Self-pay | Admitting: General Surgery

## 2016-03-29 ENCOUNTER — Ambulatory Visit
Admission: RE | Admit: 2016-03-29 | Discharge: 2016-03-29 | Disposition: A | Discharge status: Home / Self Care | Payer: BC Managed Care – PPO | Source: Ambulatory Visit | Attending: Family Medicine | Admitting: Family Medicine

## 2016-03-29 DIAGNOSIS — Z1231 Encounter for screening mammogram for malignant neoplasm of breast: Secondary | ICD-10-CM | POA: Insufficient documentation

## 2016-04-17 ENCOUNTER — Encounter: Payer: Self-pay | Admitting: Family Medicine

## 2016-04-17 ENCOUNTER — Ambulatory Visit (INDEPENDENT_AMBULATORY_CARE_PROVIDER_SITE_OTHER): Payer: Medicare Other | Admitting: Family Medicine

## 2016-04-17 VITALS — BP 134/82 | HR 88 | Temp 98.2°F | Resp 16 | Ht 62.0 in | Wt 220.4 lb

## 2016-04-17 DIAGNOSIS — R109 Unspecified abdominal pain: Secondary | ICD-10-CM

## 2016-04-17 DIAGNOSIS — R350 Frequency of micturition: Secondary | ICD-10-CM

## 2016-04-17 LAB — POCT URINALYSIS DIPSTICK
BILIRUBIN UA: NEGATIVE
Glucose, UA: NEGATIVE
KETONES UA: NEGATIVE
Nitrite, UA: NEGATIVE
PH UA: 5
PROTEIN UA: NEGATIVE
Spec Grav, UA: 1.02
Urobilinogen, UA: NEGATIVE

## 2016-04-17 MED ORDER — CIPROFLOXACIN HCL 250 MG PO TABS: 250.0000 mg | ORAL_TABLET | Freq: Two times a day (BID) | ORAL | 0 refills | Status: DC

## 2016-04-17 NOTE — Progress Notes (Signed)
Name: [place] ScrapeLinda C Bouchard   MRN: [id number]    DOB: 04-22-1951   Date:04/17/2016       Progress Note  Subjective  Chief Complaint  Chief Complaint  Patient presents with  . Urinary Frequency    Onset-5 days, urinary hestiancy, odor, lower abdominal cramping.     HPI  Urinary frequency: she states that over the past 5 days she has noticed supra pubic pain cramping, urinary frequency and nocturia. No hematuria, mild dysuria and also has noticed hesitancy. No radiation. No fever, but had chills one day. No change in bowel movements, normal appetite.    Patient Active Problem List   Diagnosis Date Noted  . History of bunionectomy of left great toe 07/15/2015  . H/O hammer toe correction 03/15/2015  . Benign essential HTN 11/05/2014  . Carpal tunnel syndrome 11/05/2014  . Osteoarthritis 11/05/2014  . Dermatitis, eczematoid 11/05/2014  . Depression, major, recurrent, mild (HCC) 11/05/2014  . Dyslipidemia 11/05/2014  . Gastro-esophageal reflux disease without esophagitis 11/05/2014  . Extreme obesity (HCC) 11/05/2014  . Dysmetabolic syndrome 11/05/2014  . Menopausal and perimenopausal disorder 11/05/2014  . Perennial allergic rhinitis with seasonal variation 11/05/2014  . Seborrhea capitis 11/05/2014  . Acquired trigger finger 11/05/2014  . Urinary incontinence in female 11/05/2014  . Vitamin D deficiency 11/05/2014  . Blood glucose elevated 08/31/2009    Past Surgical History:  Procedure Laterality Date  . ABDOMINAL HYSTERECTOMY    . CESAREAN SECTION    . COLONOSCOPY WITH PROPOFOL N/A 03/15/2016   Procedure: COLONOSCOPY WITH PROPOFOL;  Surgeon: Kieth BrightlySeeplaputhur G Sankar, MD;  Location: [facility] ENDOSCOPY;  Service: Endoscopy;  Laterality: N/A;  . HALLUX VALGUS LAPIDUS Left 06/16/2015   Procedure: HALLUX VALGUS LAPIDUS;  Surgeon: Gwyneth RevelsJustin Fowler, DPM;  [facility];  Service: Podiatry;  Laterality: Left;  WITH POPLITEAL  . HAMMER TOE SURGERY Left 06/16/2015   Procedure:  HAMMER TOE CORRECTION SECOND TOE LEFT;  Surgeon: Gwyneth RevelsJustin Fowler, DPM;  [facility];  Service: Podiatry;  Laterality: Left;  prediabetic - on oral meds  . MYOMECTOMY    . trigger finger Right 12/15/2014  . Vulva cyst  11/2006    Family History  Problem Relation Age of Onset  . Dementia Mother   . Diabetes Father   . Cancer Daughter     Fibroid Tumors  . Breast cancer Neg Hx     Social History   Social History  . Marital status: Married    Spouse name: N/A  . Number of children: N/A  . Years of education: N/A   Occupational History  . Not on file.   Social History Main Topics  . Smoking status: Never Smoker  . Smokeless tobacco: Never Used  . Alcohol use No  . Drug use: No  . Sexual activity: Yes    Partners: Male   Other Topics Concern  . Not on file   Social History Narrative  . No narrative on file     Current Outpatient Prescriptions:  .  acetaminophen (TYLENOL) 500 MG tablet, Take by mouth. Reported on 11/15/2015, Disp: , Rfl:  .  Amlodipine-Valsartan-HCTZ 5-160-12.5 MG TABS, TAKE 1 TABLET BY MOUTH DAILY., Disp: 90 tablet, Rfl: 1 .  atorvastatin (LIPITOR) 40 MG tablet, Take 1 tablet (40 mg total) by mouth every evening. for cholesterol, Disp: 90 tablet, Rfl: 1 .  Cyanocobalamin (B-12) 1000 MCG SUBL, Place under the tongue., Disp: , Rfl:  .  DULoxetine (CYMBALTA) 60 MG capsule, Take 1 capsule (60 mg  total) by mouth daily., Disp: 90 capsule, Rfl: 1 .  fluticasone (FLONASE) 50 MCG/ACT nasal spray, Place 2 sprays into both nostrils daily., Disp: 48 g, Rfl: 0 .  glucose blood (ONETOUCH VERIO) test strip, Use as instructed, Disp: 100 each, Rfl: 12 .  loratadine (CLARITIN) 10 MG tablet, TAKE 1 TABLET (10 MG TOTAL) BY MOUTH DAILY., Disp: 90 tablet, Rfl: 1 .  metFORMIN (GLUCOPHAGE) 500 MG tablet, Take 1 tablet (500 mg total) by mouth 2 (two) times daily., Disp: 180 tablet, Rfl: 1 .  nadolol (CORGARD) 20 MG tablet, Take 1 tablet (20 mg total) by mouth every  evening., Disp: 90 tablet, Rfl: 1 .  triamcinolone cream (KENALOG) 0.1 %, Reported on 07/15/2015, Disp: , Rfl:  .  ciprofloxacin (CIPRO) 250 MG tablet, Take 1 tablet (250 mg total) by mouth 2 (two) times daily., Disp: 10 tablet, Rfl: 0  No Known Allergies   ROS  Ten systems reviewed and is negative except as mentioned in HPI   Objective  Vitals:   04/17/16 1511  BP: 134/82  Pulse: 88  Resp: 16  Temp: 98.2 F (36.8 C)  TempSrc: Oral  SpO2: 98%  Weight: 220 lb 6.4 oz (100 kg)  Height: 5\' 2"  (1.575 m)    Body mass index is 40.31 kg/m.  Physical Exam  Constitutional: Patient appears well-developed and well-nourished. Obese  No distress.  HEENT: head atraumatic, normocephalic, pupils equal and reactive to light, neck supple, throat within normal limits Cardiovascular: Normal rate, regular rhythm and normal heart sounds.  No murmur heard. No BLE edema. Pulmonary/Chest: Effort normal and breath sounds normal. No respiratory distress. Abdominal: Soft.  There is supra pubic  Tenderness, negative CVA tenderness . Psychiatric: Patient has a normal mood and affect. behavior is normal. Judgment and thought content normal.  Recent Results (from the past 2160 hour(s))  Glucose, capillary     Status: Abnormal   Collection Time: 03/15/16  7:33 AM  Result Value Ref Range   Glucose-Capillary 110 (H) 65 - 99 mg/dL  POCT urinalysis dipstick     Status: Abnormal   Collection Time: 04/17/16  3:16 PM  Result Value Ref Range   Color, UA light yellow    Clarity, UA clear    Glucose, UA neg    Bilirubin, UA neg    Ketones, UA neg    Spec Grav, UA 1.020    Blood, UA non-hemolyzed moderate    pH, UA 5.0    Protein, UA neg    Urobilinogen, UA negative    Nitrite, UA neg    Leukocytes, UA large (3+) (A) Negative     PHQ2/9: [facility] screen [facility] 02/22/2016 01/17/2016 12/20/2015 12/20/2015 11/15/2015  Decreased Interest 1 0 1 0 0  Down, Depressed, Hopeless 1 0 1 0 0  PHQ - 2 Score 2 0  2 0 0  Altered sleeping 0 0 0 - -  Tired, decreased energy 0 0 3 - -  Change in appetite 0 0 0 - -  Feeling bad or failure about yourself  0 0 0 - -  Trouble concentrating 0 0 0 - -  Moving slowly or fidgety/restless 0 0 0 - -  Suicidal thoughts 0 0 0 - -  PHQ-9 Score 2 0 5 - -  Difficult doing work/chores Not difficult at all - Somewhat difficult - -     Fall Risk: Fall Risk  02/22/2016 01/17/2016 12/20/2015 11/15/2015 07/15/2015  Falls in the past year? Yes Yes Yes No  Yes  Number falls in past yr: 1 1 1  - 2 or more  Injury with Fall? No No No - Yes  Follow up Falls evaluation completed - - - -      Assessment & Plan  1. Urinary frequency  - POCT urinalysis dipstick - Urine Culture - ciprofloxacin (CIPRO) 250 MG tablet; Take 1 tablet (250 mg total) by mouth 2 (two) times daily.  Dispense: 10 tablet; Refill: 0  2. Abdominal cramping  - POCT urinalysis dipstick - Urine Culture - ciprofloxacin (CIPRO) 250 MG tablet; Take 1 tablet (250 mg total) by mouth 2 (two) times daily.  Dispense: 10 tablet; Refill: 0

## 2016-04-18 ENCOUNTER — Ambulatory Visit: Payer: BC Managed Care – PPO | Admitting: Family Medicine

## 2016-04-18 LAB — URINE CULTURE

## 2016-05-06 ENCOUNTER — Other Ambulatory Visit: Payer: Self-pay | Admitting: Family Medicine

## 2016-06-05 ENCOUNTER — Ambulatory Visit: Payer: BC Managed Care – PPO | Admitting: Family Medicine

## 2016-06-08 ENCOUNTER — Encounter: Payer: Self-pay | Admitting: Family Medicine

## 2016-06-08 ENCOUNTER — Ambulatory Visit (INDEPENDENT_AMBULATORY_CARE_PROVIDER_SITE_OTHER): Payer: Medicare Other | Admitting: Family Medicine

## 2016-06-08 VITALS — BP 132/78 | HR 81 | Temp 98.2°F | Resp 16 | Ht 62.0 in | Wt 211.8 lb

## 2016-06-08 DIAGNOSIS — E8881 Metabolic syndrome: Secondary | ICD-10-CM | POA: Diagnosis not present

## 2016-06-08 DIAGNOSIS — F330 Major depressive disorder, recurrent, mild: Secondary | ICD-10-CM | POA: Diagnosis not present

## 2016-06-08 DIAGNOSIS — R739 Hyperglycemia, unspecified: Secondary | ICD-10-CM

## 2016-06-08 DIAGNOSIS — E785 Hyperlipidemia, unspecified: Secondary | ICD-10-CM

## 2016-06-08 DIAGNOSIS — I10 Essential (primary) hypertension: Secondary | ICD-10-CM | POA: Diagnosis not present

## 2016-06-08 DIAGNOSIS — E559 Vitamin D deficiency, unspecified: Secondary | ICD-10-CM | POA: Diagnosis not present

## 2016-06-08 DIAGNOSIS — E538 Deficiency of other specified B group vitamins: Secondary | ICD-10-CM | POA: Diagnosis not present

## 2016-06-08 DIAGNOSIS — F411 Generalized anxiety disorder: Secondary | ICD-10-CM

## 2016-06-08 DIAGNOSIS — R05 Cough: Secondary | ICD-10-CM

## 2016-06-08 DIAGNOSIS — R059 Cough, unspecified: Secondary | ICD-10-CM

## 2016-06-08 DIAGNOSIS — Z23 Encounter for immunization: Secondary | ICD-10-CM

## 2016-06-08 LAB — LIPID PANEL
Cholesterol: 98 mg/dL (ref <200)
HDL: 47 mg/dL — ABNORMAL LOW (ref >50)
LDL CALC: 33 mg/dL (ref <100)
TRIGLYCERIDES: 91 mg/dL (ref <150)
Total CHOL/HDL Ratio: 2.1 Ratio (ref <5.0)
VLDL: 18 mg/dL (ref <30)

## 2016-06-08 LAB — COMPLETE METABOLIC PANEL WITH GFR
ALBUMIN: 4.3 g/dL (ref 3.6–5.1)
ALK PHOS: 71 U/L (ref 33–130)
ALT: 15 U/L (ref 6–29)
AST: 17 U/L (ref 10–35)
BILIRUBIN TOTAL: 1 mg/dL (ref 0.2–1.2)
BUN: 10 mg/dL (ref 7–25)
CALCIUM: 9.9 mg/dL (ref 8.6–10.4)
CO2: 30 mmol/L (ref 20–31)
Chloride: 101 mmol/L (ref 98–110)
Creat: 0.81 mg/dL (ref 0.50–0.99)
GFR, EST NON AFRICAN AMERICAN: 76 mL/min (ref >=60)
GFR, Est African American: 88 mL/min (ref >=60)
Glucose, Bld: 93 mg/dL (ref 65–99)
POTASSIUM: 4.1 mmol/L (ref 3.5–5.3)
Sodium: 142 mmol/L (ref 135–146)
Total Protein: 7.2 g/dL (ref 6.1–8.1)

## 2016-06-08 LAB — VITAMIN B12: VITAMIN B 12: 1417 pg/mL — AB (ref 200–1100)

## 2016-06-08 MED ORDER — METFORMIN HCL 500 MG PO TABS
500.0000 mg | ORAL_TABLET | Freq: Two times a day (BID) | ORAL | 1 refills | Status: DC
Start: 2016-06-08 — End: 2016-11-14

## 2016-06-08 MED ORDER — ATORVASTATIN CALCIUM 40 MG PO TABS: 40.0000 mg | ORAL_TABLET | Freq: Every evening | ORAL | 1 refills | Status: DC

## 2016-06-08 MED ORDER — BENZONATATE 100 MG PO CAPS: 100.0000 mg | ORAL_CAPSULE | Freq: Three times a day (TID) | ORAL | 0 refills | Status: DC | PRN

## 2016-06-08 MED ORDER — DULOXETINE HCL 60 MG PO CPEP: 60.0000 mg | ORAL_CAPSULE | Freq: Every day | ORAL | 1 refills | Status: DC

## 2016-06-08 MED ORDER — NADOLOL 20 MG PO TABS: 20.0000 mg | ORAL_TABLET | Freq: Every evening | ORAL | 1 refills | Status: DC

## 2016-06-08 NOTE — Progress Notes (Signed)
Name: Monique Baker   MRN: [id number]    DOB: [birth year]   Date:06/08/2016       Progress Note  Subjective  Chief Complaint  Chief Complaint  Patient presents with  . Medication Refill    4 month F/U  . Depression    states her symptoms are improving  . Hypertension    Denies any symptoms   . Prediabetes/Metabolic Syndrome  . Obesity    Started weight watchers and has lose weight since last visit-9 pounds  . Hammer Toe    Had surgery a year ago but still states her toe has been throbbing every times she walks.  . Allergic Rhinitis     Has been having excessive coughing and white mucus. Feels like she has bronchitis    HPI  Prediabetes/metabolic syndrome: hgbA1C is at goal, last level in July was 5.9%,  taking Metformin and is down to one pill daily, denies polyphagia, polyuria or polydipsia. She has joined [employer] January 2018 and she has lost 8 lbs since last visit.   HTN: taking bp medication, denies side effects, no chest pain, no palpitation. She is complaint with medication. BP is at goal   Right hip pain: seen by Dr. Hyacinth Meeker, taking Meloxicam prn, but not as often now, he told her to lose weight to see if pain will improve symptoms.   Obesity: she had lost 8 lbs since she joined weight watchers 05/2016 and is doing well. Not drinking sweet beverages, counting points.   Depression Major: she is married to a Education officer, environmental, he also works as a Airline pilot at [employer]. She is only baby-sitting grandchildren prn and that is not as stressful now. . We started her on Cymbalta 10/2015 and is feeling better no longer has anhedonia, motivation is back, no longer having crying spells, and not feeling so hungry. Asking church members to help her out - delegating responsibilities. She is working on boundaries and is helping her focus more on herself.   Cough: she had an URI, she is feeling much better, no SOB or wheezing, but she still has a dry cough, that is worse at night,  feeling well otherwise, no coughing today  Patient Active Problem List   Diagnosis Date Noted  . History of bunionectomy of left great toe 07/15/2015  . H/O hammer toe correction 03/15/2015  . Benign essential HTN 11/05/2014  . Carpal tunnel syndrome 11/05/2014  . Osteoarthritis 11/05/2014  . Dermatitis, eczematoid 11/05/2014  . Depression, major, recurrent, mild (HCC) 11/05/2014  . Dyslipidemia 11/05/2014  . Gastro-esophageal reflux disease without esophagitis 11/05/2014  . Extreme obesity (HCC) 11/05/2014  . Dysmetabolic syndrome 11/05/2014  . Menopausal and perimenopausal disorder 11/05/2014  . Perennial allergic rhinitis with seasonal variation 11/05/2014  . Seborrhea capitis 11/05/2014  . Acquired trigger finger 11/05/2014  . Urinary incontinence in female 11/05/2014  . Vitamin D deficiency 11/05/2014  . Blood glucose elevated 08/31/2009    Past Surgical History:  Procedure Laterality Date  . ABDOMINAL HYSTERECTOMY    . CESAREAN SECTION    . COLONOSCOPY WITH PROPOFOL N/A 03/15/2016   Procedure: COLONOSCOPY WITH PROPOFOL;  Surgeon: Kieth Brightly, MD;  Location: [facility] END[facility];  Service: Endoscopy;  Laterality: N/A;  . HALLUX VALGUS LAPIDUS Left 06/16/2015   Procedure: HALLUX VALGUS LAPIDUS;  Surgeon: Gwyneth Revels, DPM;  Location: [facility];  Service: Podiatry;  Laterality: Left;  WITH POPLITEAL  . HAMMER TOE SURGERY Left 06/16/2015   Procedure: HAMMER TOE CORRECTION SECOND TOE LEFT;  Surgeon: Gwyneth Revels, DPM;  Location: [facility];  Service: Podiatry;  Laterality: Left;  prediabetic - on oral meds  . MYOMECTOMY    . trigger finger Right 12/15/2014  . Vulva cyst  11/2006    Family History  Problem Relation Age of Onset  . Dementia Mother   . Diabetes Father   . Cancer Daughter     Fibroid Tumors  . Breast cancer Neg Hx     Social History   Social History  . Marital status: Married    Spouse name: N/A  . Number of children: N/A  .  Years of education: N/A   Occupational History  . Not on file.   Social History Main Topics  . Smoking status: Never Smoker  . Smokeless tobacco: Never Used  . Alcohol use No  . Drug use: No  . Sexual activity: Yes    Partners: Male   Other Topics Concern  . Not on file   Social History Narrative  . No narrative on file     Current Outpatient Prescriptions:  .  acetaminophen (TYLENOL) 500 MG tablet, Take by mouth. Reported on 11/15/2015, Disp: , Rfl:  .  Amlodipine-Valsartan-HCTZ 5-160-12.5 MG TABS, TAKE 1 TABLET BY MOUTH DAILY., Disp: 90 tablet, Rfl: 1 .  atorvastatin (LIPITOR) 40 MG tablet, Take 1 tablet (40 mg total) by mouth every evening. for cholesterol, Disp: 90 tablet, Rfl: 1 .  Cyanocobalamin (B-12) 1000 MCG SUBL, Place under the tongue., Disp: , Rfl:  .  DULoxetine (CYMBALTA) 60 MG capsule, Take 1 capsule (60 mg total) by mouth daily., Disp: 90 capsule, Rfl: 1 .  fluticasone (FLONASE) 50 MCG/ACT nasal spray, Place 2 sprays into both nostrils daily., Disp: 48 g, Rfl: 0 .  glucose blood (ONETOUCH VERIO) test strip, Use as instructed, Disp: 100 each, Rfl: 12 .  loratadine (CLARITIN) 10 MG tablet, TAKE 1 TABLET (10 MG TOTAL) BY MOUTH DAILY., Disp: 90 tablet, Rfl: 1 .  metFORMIN (GLUCOPHAGE) 500 MG tablet, Take 1 tablet (500 mg total) by mouth 2 (two) times daily., Disp: 180 tablet, Rfl: 1 .  nadolol (CORGARD) 20 MG tablet, Take 1 tablet (20 mg total) by mouth every evening., Disp: 90 tablet, Rfl: 1 .  triamcinolone cream (KENALOG) 0.1 %, Reported on 07/15/2015, Disp: , Rfl:   No Known Allergies   ROS  Constitutional: Negative for fever, positive for  weight change.  Respiratory: Negative for cough and shortness of breath.   Cardiovascular: Negative for chest pain or palpitations.  Gastrointestinal: Negative for abdominal pain, no bowel changes.  Musculoskeletal: Negative for gait problem or joint swelling.  Skin: Negative for rash.  Neurological: Negative for  dizziness or headache.  No other specific complaints in a complete review of systems (except as listed in HPI above).   Objective  Vitals:   06/08/16 1027  BP: 132/78  Pulse: 81  Resp: 16  Temp: 98.2 F (36.8 C)  TempSrc: Oral  SpO2: 96%  Weight: 211 lb 12.8 oz (96.1 kg)  Height: 5\' 2"  (1.575 m)    Body mass index is 38.74 kg/m.  Physical Exam  Constitutional: Patient appears well-developed and well-nourished. Obese  No distress.  HEENT: head atraumatic, normocephalic, pupils equal and reactive to light,  neck supple, throat within normal limits Cardiovascular: Normal rate, regular rhythm and normal heart sounds.  No murmur heard. No BLE edema. Pulmonary/Chest: Effort normal and breath sounds normal. No respiratory distress. Abdominal: Soft.  There is no tenderness. Psychiatric: Patient has  a normal mood and affect. behavior is normal. Judgment and thought content normal.  Recent Results (from the past 2160 hour(s))  Glucose, capillary     Status: Abnormal   Collection Time: 03/15/16  7:33 AM  Result Value Ref Range   Glucose-Capillary 110 (H) 65 - 99 mg/dL  POCT urinalysis dipstick     Status: Abnormal   Collection Time: 04/17/16  3:16 PM  Result Value Ref Range   Color, UA light yellow    Clarity, UA clear    Glucose, UA neg    Bilirubin, UA neg    Ketones, UA neg    Spec Grav, UA 1.020    Blood, UA non-hemolyzed moderate    pH, UA 5.0    Protein, UA neg    Urobilinogen, UA negative    Nitrite, UA neg    Leukocytes, UA large (3+) (A) Negative  Urine Culture     Status: None   Collection Time: 04/17/16  3:45 PM  Result Value Ref Range   Colony Count 10,000-50,000 CFU/mL    Organism ID, Bacteria GROUP B STREP (S.[email or web address]) ISOLATED      PHQ2/9: [facility] 2/[facility] 06/08/2016 02/22/2016 01/17/2016 12/20/2015 12/20/2015  Decreased Interest 0 1 0 1 0  Down, Depressed, Hopeless 0 1 0 1 0  PHQ - 2 Score 0 2 0 2 0  Altered sleeping - 0 0 0 -  Tired,  decreased energy - 0 0 3 -  Change in appetite - 0 0 0 -  Feeling bad or failure about yourself  - 0 0 0 -  Trouble concentrating - 0 0 0 -  Moving slowly or fidgety/restless - 0 0 0 -  Suicidal thoughts - 0 0 0 -  PHQ-9 Score - 2 0 5 -  Difficult doing work/chores - Not difficult at all - Somewhat difficult -     Fall Risk: Fall Risk  06/08/2016 02/22/2016 01/17/2016 12/20/2015 11/15/2015  Falls in the past year? No Yes Yes Yes No  Number falls in past yr: - 1 1 1  -  Injury with Fall? - No No No -  Follow up - Falls evaluation completed - - -      Functional Status Survey: Is the patient deaf or have difficulty hearing?: No Does the patient have difficulty seeing, even when wearing glasses/contacts?: No Does the patient have difficulty concentrating, remembering, or making decisions?: No Does the patient have difficulty walking or climbing stairs?: No Does the patient have difficulty dressing or bathing?: No Does the patient have difficulty doing errands alone such as visiting a doctor's office or shopping?: No    Assessment & Plan  1. Benign essential HTN  - nadolol (CORGARD) 20 MG tablet; Take 1 tablet (20 mg total) by mouth every evening.  Dispense: 90 tablet; Refill: 1 - COMPLETE METABOLIC PANEL WITH GFR  2. Dyslipidemia  - atorvastatin (LIPITOR) 40 MG tablet; Take 1 tablet (40 mg total) by mouth every evening. for cholesterol  Dispense: 90 tablet; Refill: 1 - Lipid panel  3. Vitamin D deficiency   4. Hyperglycemia  - Hemoglobin A1c - Insulin, fasting  5. B12 deficiency  - Vitamin B12  6. Dysmetabolic syndrome  - metFORMIN (GLUCOPHAGE) 500 MG tablet; Take 1 tablet (500 mg total) by mouth 2 (two) times daily.  Dispense: 180 tablet; Refill: 1 - VITAMIN D 25 Hydroxy (Vit-D Deficiency, Fractures)  7. Depression, major, recurrent, mild (HCC)  - DULoxetine (CYMBALTA) 60 MG capsule; Take 1 capsule (60  mg total) by mouth daily.  Dispense: 90 capsule; Refill:  1  8. GAD (generalized anxiety disorder)  - DULoxetine (CYMBALTA) 60 MG capsule; Take 1 capsule (60 mg total) by mouth daily.  Dispense: 90 capsule; Refill: 1  9. Morbid obesity due to excess calories (HCC)  - Hemoglobin A1c - Insulin, fasting  10. Need for pneumococcal vaccine  - Pneumococcal conjugate vaccine 13-valent IM  11. Cough  - benzonatate (TESSALON) 100 MG capsule; Take 1-2 capsules (100-200 mg total) by mouth 3 (three) times daily as needed.  Dispense: 40 capsule; Refill: 0

## 2016-06-09 LAB — INSULIN, FASTING: Insulin fasting, serum: 10.8 u[IU]/mL (ref 2.0–19.6)

## 2016-06-09 LAB — VITAMIN D 25 HYDROXY (VIT D DEFICIENCY, FRACTURES): Vit D, 25-Hydroxy: 39 ng/mL (ref 30–100)

## 2016-06-09 LAB — HEMOGLOBIN A1C
HEMOGLOBIN A1C: 5.9 % — AB (ref <5.7)
Mean Plasma Glucose: 123 mg/dL

## 2016-08-10 ENCOUNTER — Ambulatory Visit (INDEPENDENT_AMBULATORY_CARE_PROVIDER_SITE_OTHER): Payer: Medicare Other | Admitting: Family Medicine

## 2016-08-10 ENCOUNTER — Encounter: Payer: Self-pay | Admitting: Family Medicine

## 2016-08-10 VITALS — BP 130/78 | HR 84 | Temp 98.0°F | Resp 18 | Ht 62.0 in | Wt 210.2 lb

## 2016-08-10 DIAGNOSIS — R9431 Abnormal electrocardiogram [ECG] [EKG]: Secondary | ICD-10-CM

## 2016-08-10 DIAGNOSIS — E2839 Other primary ovarian failure: Secondary | ICD-10-CM

## 2016-08-10 DIAGNOSIS — Z6838 Body mass index (BMI) 38.0-38.9, adult: Secondary | ICD-10-CM | POA: Diagnosis not present

## 2016-08-10 DIAGNOSIS — Z0000 Encounter for general adult medical examination without abnormal findings: Secondary | ICD-10-CM | POA: Diagnosis not present

## 2016-08-10 NOTE — Progress Notes (Signed)
Name: Monique Baker   MRN: [id number]    DOB: 07/07/1950   Date:08/10/2016       Progress Note  Subjective  Chief Complaint  Chief Complaint  Patient presents with  . Medicare Wellness    HPI  Functional ability/safety issues: No Issues Hearing issues: Addressed  Activities of daily living: Discussed Home safety issues: No Issues  End Of Life Planning: Offered verbal information regarding advanced directives, healthcare power of attorney.  Preventative care, Health maintenance, Preventative health measures discussed.  Preventative screenings discussed today: lab work, colonoscopy,  mammogram, DEXA.  Low Dose CT Chest recommended if Age 64-80 years, 30 pack-year currently smoking OR have quit w/in 15years.   Lifestyle risk factor issued reviewed: Diet, exercise, weight management, advised patient smoking is not healthy, nutrition/diet.  Preventative health measures discussed (5-10 year plan).  Reviewed and recommended vaccinations: - Pneumovax  - Prevnar  - Annual Influenza - Zostavax - Tdap   Depression screening: Done - on medication Fall risk screening: Done Discuss ADLs/IADLs: Done  Current medical providers: See HPI  Other health risk factors identified this visit: No other issues Cognitive impairment issues: None identified  All above discussed with patient. Appropriate education, counseling and referral will be made based upon the above.   Abnormal EKG: changed since checked in 2016, she states only chest pain is like a gas bubble, that is only on left side of chest, but only lasts a few seconds, not associated with diaphoresis or SOB  Extreme obesity: she had lost 10 lbs since she joined weight watchers 05/2016 and is doing well. Not drinking sweet beverages, counting points, eating more fruit and vegetables.    Patient Active Problem List   Diagnosis Date Noted  . History of bunionectomy of left great toe 07/15/2015  . H/O hammer toe correction  03/15/2015  . Benign essential HTN 11/05/2014  . Carpal tunnel syndrome 11/05/2014  . Osteoarthritis 11/05/2014  . Dermatitis, eczematoid 11/05/2014  . Depression, major, recurrent, mild (HCC) 11/05/2014  . Dyslipidemia 11/05/2014  . Gastro-esophageal reflux disease without esophagitis 11/05/2014  . Extreme obesity (HCC) 11/05/2014  . Dysmetabolic syndrome 11/05/2014  . Menopausal and perimenopausal disorder 11/05/2014  . Perennial allergic rhinitis with seasonal variation 11/05/2014  . Seborrhea capitis 11/05/2014  . Acquired trigger finger 11/05/2014  . Urinary incontinence in female 11/05/2014  . Vitamin D deficiency 11/05/2014  . Blood glucose elevated 08/31/2009    Past Surgical History:  Procedure Laterality Date  . ABDOMINAL HYSTERECTOMY    . CESAREAN SECTION    . COLONOSCOPY WITH PROPOFOL N/A 03/15/2016   Procedure: COLONOSCOPY WITH PROPOFOL;  Surgeon: Kieth Brightly, MD;  Location: [facility];  Service: Endoscopy;  Laterality: N/A;  . HALLUX VALGUS LAPIDUS Left 06/16/2015   Procedure: HALLUX VALGUS LAPIDUS;  Surgeon: Gwyneth Revels, DPM;  Location: [facility];  Service: Podiatry;  Laterality: Left;  WITH POPLITEAL  . HAMMER TOE SURGERY Left 06/16/2015   Procedure: HAMMER TOE CORRECTION SECOND TOE LEFT;  Surgeon: Gwyneth Revels, DPM;  Location: [facility];  Service: Podiatry;  Laterality: Left;  prediabetic - on oral meds  . MYOMECTOMY    . trigger finger Right 12/15/2014  . Vulva cyst  11/2006    Family History  Problem Relation Age of Onset  . Dementia Mother   . Diabetes Father   . Cancer Daughter     Fibroid Tumors  . Diabetes Brother   . Breast cancer Neg Hx     Social History  Social History  . Marital status: Married    Spouse name: N/A  . Number of children: N/A  . Years of education: N/A   Occupational History  . Not on file.   Social History Main Topics  . Smoking status: Never Smoker  . Smokeless tobacco: Never Used   . Alcohol use No  . Drug use: No  . Sexual activity: Yes    Partners: Male   Other Topics Concern  . Not on file   Social History Narrative  . No narrative on file     Current Outpatient Prescriptions:  .  acetaminophen (TYLENOL) 500 MG tablet, Take by mouth. Reported on 11/15/2015, Disp: , Rfl:  .  Amlodipine-Valsartan-HCTZ 5-160-12.5 MG TABS, TAKE 1 TABLET BY MOUTH DAILY., Disp: 90 tablet, Rfl: 1 .  atorvastatin (LIPITOR) 40 MG tablet, Take 1 tablet (40 mg total) by mouth every evening. for cholesterol, Disp: 90 tablet, Rfl: 1 .  Cyanocobalamin (B-12) 1000 MCG SUBL, Place under the tongue., Disp: , Rfl:  .  DULoxetine (CYMBALTA) 60 MG capsule, Take 1 capsule (60 mg total) by mouth daily., Disp: 90 capsule, Rfl: 1 .  fluticasone (FLONASE) 50 MCG/ACT nasal spray, Place 2 sprays into both nostrils daily., Disp: 48 g, Rfl: 0 .  glucose blood (ONETOUCH VERIO) test strip, Use as instructed, Disp: 100 each, Rfl: 12 .  loratadine (CLARITIN) 10 MG tablet, TAKE 1 TABLET (10 MG TOTAL) BY MOUTH DAILY., Disp: 90 tablet, Rfl: 1 .  metFORMIN (GLUCOPHAGE) 500 MG tablet, Take 1 tablet (500 mg total) by mouth 2 (two) times daily., Disp: 180 tablet, Rfl: 1 .  nadolol (CORGARD) 20 MG tablet, Take 1 tablet (20 mg total) by mouth every evening., Disp: 90 tablet, Rfl: 1 .  triamcinolone cream (KENALOG) 0.1 %, Reported on 07/15/2015, Disp: , Rfl:   No Known Allergies   ROS  Constitutional: Negative for fever or weight change.  Respiratory: Negative for cough and shortness of breath.   Cardiovascular: Negative for chest pain or palpitations.  Gastrointestinal: Negative for abdominal pain, no bowel changes.  Musculoskeletal: Positive  for gait problem ( limps when is about to rain ) or joint swelling.  Skin: Negative for rash.  Neurological: Negative for dizziness or headache.  No other specific complaints in a complete review of systems (except as listed in HPI above).  Objective  Vitals:    08/10/16 1030  BP: 130/78  Pulse: 84  Resp: 18  Temp: 98 F (36.7 C)  TempSrc: Oral  SpO2: 95%  Weight: 210 lb 3.2 oz (95.3 kg)  Height:  (1.575 m)    Body mass index is 38.45 kg/m.  Physical Exam  Constitutional: Patient appears well-developed and obese. No distress.  HENT: Head: Normocephalic and atraumatic. Ears: B TMs ok, no erythema or effusion; Nose: Nose normal. Mouth/Throat: Oropharynx is clear and moist. No oropharyngeal exudate.  Eyes: Conjunctivae and EOM are normal. Pupils are equal, round, and reactive to light. No scleral icterus.  Neck: Normal range of motion. Neck supple. No JVD present. No thyromegaly present.  Cardiovascular: Normal rate, regular rhythm and normal heart sounds.  No murmur heard. No BLE edema. Pulmonary/Chest: Effort normal and breath sounds normal. No respiratory distress. Abdominal: Soft. Bowel sounds are normal, no distension. There is no tenderness. no masses Breast: no lumps or masses, no nipple discharge or rashes FEMALE GENITALIA:  Not done RECTAL: not done Musculoskeletal: Decrease rom or right hip, and has bunion on right foot  Neurological: he is  alert and oriented to person, place, and time. No cranial nerve deficit. Coordination, balance, strength, speech and gait are normal.  Skin: Skin is warm and dry. No rash noted. No erythema.  Psychiatric: Patient has a normal mood and affect. behavior is normal. Judgment and thought content normal.  Recent Results (from the past 2160 hour(s))  Hemoglobin A1c     Status: Abnormal   Collection Time: 06/08/16 11:33 AM  Result Value Ref Range   Hgb A1c MFr Bld 5.9 (H) <5.7 %    Comment:   For someone without known diabetes, a hemoglobin A1c value between 5.7% and 6.4% is consistent with prediabetes and should be confirmed with a follow-up test.   For someone with known diabetes, a value <7% indicates that their diabetes is well controlled. A1c targets should be individualized based on  duration of diabetes, age, co-morbid conditions and other considerations.   This assay result is consistent with an increased risk of diabetes.   Currently, no consensus exists regarding use of hemoglobin A1c for diagnosis of diabetes in children.      Mean Plasma Glucose 123 mg/dL  Insulin, fasting     Status: None   Collection Time: 06/08/16 11:33 AM  Result Value Ref Range   Insulin fasting, serum 10.8 2.0 - 19.6 uIU/mL    Comment:   This insulin assay shows strong cross-reactivity for some insulin analogs (lispro, aspart, and glargine) and much lower cross-reactivity with others (detemir, glulisine).   Stimulated Insulin reference intervals were established using the Siemens Immulite assay. These values are provided for general guidance only.   COMPLETE METABOLIC PANEL WITH GFR     Status: None   Collection Time: 06/08/16 11:33 AM  Result Value Ref Range   Sodium 142 135 - 146 mmol/L   Potassium 4.1 3.5 - 5.3 mmol/L   Chloride 101 98 - 110 mmol/L   CO2 30 20 - 31 mmol/L   Glucose, Bld 93 65 - 99 mg/dL   BUN 10 7 - 25 mg/dL   Creat 1.61 0.96 - 0.45 mg/dL    Comment:   For patients > or = 66 years of age: The upper reference limit for Creatinine is approximately 13% higher for people identified as African-American.      Total Bilirubin 1.0 0.2 - 1.2 mg/dL   Alkaline Phosphatase 71 33 - 130 U/L   AST 17 10 - 35 U/L   ALT 15 6 - 29 U/L   Total Protein 7.2 6.1 - 8.1 g/dL   Albumin 4.3 3.6 - 5.1 g/dL   Calcium 9.9 8.6 - 40.9 mg/dL   GFR, Est African American 88 >=60 mL/min   GFR, Est Non African American 76 >=60 mL/min  Vitamin B12     Status: Abnormal   Collection Time: 06/08/16 11:33 AM  Result Value Ref Range   Vitamin B-12 1,417 (H) 200 - 1,100 pg/mL  VITAMIN D 25 Hydroxy (Vit-D Deficiency, Fractures)     Status: None   Collection Time: 06/08/16 11:33 AM  Result Value Ref Range   Vit D, 25-Hydroxy 39 30 - 100 ng/mL    Comment: Vitamin D Status           25-OH  Vitamin D        Deficiency                <20 ng/mL        Insufficiency         20 - 29 ng/mL  Optimal             > or = 30 ng/mL   For 25-OH Vitamin D testing on patients on D2-supplementation and patients for whom quantitation of D2 and D3 fractions is required, the QuestAssureD 25-OH VIT D, (D2,D3), LC/MS/MS is recommended: order code 16109 (patients > 2 yrs).   Lipid panel     Status: Abnormal   Collection Time: 06/08/16 11:33 AM  Result Value Ref Range   Cholesterol 98 <200 mg/dL   Triglycerides 91 <604 mg/dL   HDL 47 (L) >54 mg/dL   Total CHOL/HDL Ratio 2.1 <5.0 Ratio   VLDL 18 <30 mg/dL   LDL Cholesterol 33 <098 mg/dL      JXB1/4: Depression screen [facility] 2/9 08/10/2016 06/08/2016 02/22/2016 01/17/2016 12/20/2015  Decreased Interest 0 0 1 0 1  Down, Depressed, Hopeless 0 0 1 0 1  PHQ - 2 Score 0 0 2 0 2  Altered sleeping - - 0 0 0  Tired, decreased energy - - 0 0 3  Change in appetite - - 0 0 0  Feeling bad or failure about yourself  - - 0 0 0  Trouble concentrating - - 0 0 0  Moving slowly or fidgety/restless - - 0 0 0  Suicidal thoughts - - 0 0 0  PHQ-9 Score - - 2 0 5  Difficult doing work/chores - - Not difficult at all - Somewhat difficult     Fall Risk: Fall Risk  08/10/2016 06/08/2016 02/22/2016 01/17/2016 12/20/2015  Falls in the past year? No No Yes Yes Yes  Number falls in past yr: - - Injury with Fall? - - No No No  Follow up - - Falls evaluation completed - -     Functional Status Survey: Is the patient deaf or have difficulty hearing?: No Does the patient have difficulty seeing, even when wearing glasses/contacts?: No Does the patient have difficulty concentrating, remembering, or making decisions?: No Does the patient have difficulty walking or climbing stairs?: No Does the patient have difficulty dressing or bathing?: No Does the patient have difficulty doing errands alone such as visiting a doctor's office or shopping?:  No    Assessment & Plan  1. Welcome to Medicare preventive visit  - EKG 12-Lead - Visual acuity screening - DG Bone Density; Future  2. Morbid obesity due to excess calories (HCC)  Continue weight watchers  3. Abnormal EKG  - Ambulatory referral to Cardiology  4. Ovarian failure  - DG Bone Density; Future

## 2016-09-04 ENCOUNTER — Encounter: Payer: Self-pay | Admitting: Family Medicine

## 2016-09-04 ENCOUNTER — Ambulatory Visit
Admission: RE | Admit: 2016-09-04 | Discharge: 2016-09-04 | Disposition: A | Discharge status: Home / Self Care | Payer: Medicare Other | Source: Ambulatory Visit | Attending: Family Medicine | Admitting: Family Medicine

## 2016-09-04 DIAGNOSIS — M85859 Other specified disorders of bone density and structure, unspecified thigh: Secondary | ICD-10-CM | POA: Insufficient documentation

## 2016-09-04 DIAGNOSIS — M85852 Other specified disorders of bone density and structure, left thigh: Secondary | ICD-10-CM | POA: Diagnosis not present

## 2016-09-04 DIAGNOSIS — E2839 Other primary ovarian failure: Secondary | ICD-10-CM | POA: Insufficient documentation

## 2016-09-04 DIAGNOSIS — Z0000 Encounter for general adult medical examination without abnormal findings: Secondary | ICD-10-CM | POA: Insufficient documentation

## 2016-09-06 ENCOUNTER — Telehealth: Payer: Self-pay

## 2016-09-06 NOTE — Telephone Encounter (Signed)
Patient called regarding her bone density results, informed her of results. States she had a shot in her right hip before due to the doctors thinking it was arthritis. Would this have affected it?

## 2016-09-07 NOTE — Telephone Encounter (Signed)
One shot of prednisone will not affect it.  Osteoarthritis cause it to be a falsely normal bone density, but they usually eliminate that from the reading.

## 2016-09-08 NOTE — Telephone Encounter (Signed)
Patient notified

## 2016-11-02 ENCOUNTER — Other Ambulatory Visit: Payer: Self-pay | Admitting: Family Medicine

## 2016-11-02 NOTE — Telephone Encounter (Signed)
Patient requesting refill of Amlodipine-Val-HCTZ to [facility].

## 2016-11-14 ENCOUNTER — Encounter: Payer: Self-pay | Admitting: Family Medicine

## 2016-11-14 ENCOUNTER — Ambulatory Visit (INDEPENDENT_AMBULATORY_CARE_PROVIDER_SITE_OTHER): Payer: Medicare Other | Admitting: Family Medicine

## 2016-11-14 VITALS — BP 126/72 | HR 79 | Temp 97.8°F | Resp 16 | Ht 62.0 in | Wt 208.5 lb

## 2016-11-14 DIAGNOSIS — L659 Nonscarring hair loss, unspecified: Secondary | ICD-10-CM

## 2016-11-14 DIAGNOSIS — E559 Vitamin D deficiency, unspecified: Secondary | ICD-10-CM | POA: Diagnosis not present

## 2016-11-14 DIAGNOSIS — E538 Deficiency of other specified B group vitamins: Secondary | ICD-10-CM

## 2016-11-14 DIAGNOSIS — F411 Generalized anxiety disorder: Secondary | ICD-10-CM | POA: Diagnosis not present

## 2016-11-14 DIAGNOSIS — R739 Hyperglycemia, unspecified: Secondary | ICD-10-CM | POA: Diagnosis not present

## 2016-11-14 DIAGNOSIS — I10 Essential (primary) hypertension: Secondary | ICD-10-CM

## 2016-11-14 DIAGNOSIS — F330 Major depressive disorder, recurrent, mild: Secondary | ICD-10-CM

## 2016-11-14 DIAGNOSIS — E8881 Metabolic syndrome: Secondary | ICD-10-CM

## 2016-11-14 DIAGNOSIS — E785 Hyperlipidemia, unspecified: Secondary | ICD-10-CM | POA: Diagnosis not present

## 2016-11-14 LAB — CBC WITH DIFFERENTIAL/PLATELET
BASOS PCT: 0 %
Basophils Absolute: 0 cells/uL (ref 0–200)
Eosinophils Absolute: 282 cells/uL (ref 15–500)
Eosinophils Relative: 3 %
HEMATOCRIT: 41.2 % (ref 35.0–45.0)
HEMOGLOBIN: 13.3 g/dL (ref 11.7–15.5)
LYMPHS ABS: 1786 {cells}/uL (ref 850–3900)
Lymphocytes Relative: 19 %
MCH: 27.5 pg (ref 27.0–33.0)
MCHC: 32.3 g/dL (ref 32.0–36.0)
MCV: 85.1 fL (ref 80.0–100.0)
MONO ABS: 564 {cells}/uL (ref 200–950)
MPV: 8.8 fL (ref 7.5–12.5)
Monocytes Relative: 6 %
NEUTROS PCT: 72 %
Neutro Abs: 6768 cells/uL (ref 1500–7800)
Platelets: 397 10*3/uL (ref 140–400)
RBC: 4.84 MIL/uL (ref 3.80–5.10)
RDW: 13.9 % (ref 11.0–15.0)
WBC: 9.4 10*3/uL (ref 3.8–10.8)

## 2016-11-14 MED ORDER — OLMESARTAN-AMLODIPINE-HCTZ 20-5-12.5 MG PO TABS: 1.0000 | ORAL_TABLET | Freq: Every day | ORAL | 0 refills | Status: DC

## 2016-11-14 MED ORDER — METFORMIN HCL 500 MG PO TABS: 500.0000 mg | ORAL_TABLET | Freq: Two times a day (BID) | ORAL | 1 refills | Status: DC

## 2016-11-14 MED ORDER — CITALOPRAM HYDROBROMIDE 20 MG PO TABS: 20.0000 mg | ORAL_TABLET | Freq: Every day | ORAL | 3 refills | Status: DC

## 2016-11-14 MED ORDER — NADOLOL 20 MG PO TABS: 20.0000 mg | ORAL_TABLET | Freq: Every evening | ORAL | 1 refills | Status: DC

## 2016-11-14 MED ORDER — ATORVASTATIN CALCIUM 40 MG PO TABS: 40.0000 mg | ORAL_TABLET | Freq: Every evening | ORAL | 1 refills | Status: DC

## 2016-11-14 NOTE — Progress Notes (Signed)
Name: Monique Baker   MRN: [id number]    DOB: 1950/12/27   Date:11/14/2016       Progress Note  Subjective  Chief Complaint  Chief Complaint  Patient presents with  . Medication Refill    3 month F/U, Having trouble with hair loss  . Hypertension    Having occasionally headaches might be due to allergies.  . Depression    Thinks Cymbalta is causing her to sweat and wanted to discuss side effect  . Allergic Rhinitis     Has been having sore throat and a foul taste in her mouth, having yellow mucus in her throat and wanted checked out.  . Obesity    Has lost 2 pounds since last visit, doing weight watchers    HPI   Pre diabetes/metabolic syndrome: she is taking Metformin, denies side effects, hgbA1C was 5.9%, no polyphagia, polydipsia or polyuria  Right hip pain/OA: aggravated by activity, such as standing for a long time, walking helps with pain. Not taking medications at this time, seen by Dr. Hyacinth Meeker in the past, needs to lose weight.   Extreme obesity: she had lost 10 lbs since she joined weight watchers 05/2016 and is doing well. Not drinking sweet beverages, counting points, eating more fruit and vegetables. She has not started physical activity at this time.   HTN: taking bp medication, denies side effects, no chest pain, no palpitation. She is complaint with medication. BP is at goal , We will change to Tribenzor since Valsartan recalled yesterday.   Right hip pain: seen by Dr. Hyacinth Meeker, taking Meloxicam prn, but not as often now, he told her to lose weight to see if pain will improve symptoms.   Depression Major: she is married to a Education officer, environmental, he also works as a Airline pilot at [employer]. She is only baby-sitting grandchildren prn and that is not as stressful now. We started her on Cymbalta 10/2015 and is feeling better no longer has anhedonia, motivation is back, no longer having crying spells, and not feeling so hungry, however has hot flashes and hair loss and is concerned  about possible side effects. We will check labs, and try switching to Citalopram instead.    Patient Active Problem List   Diagnosis Date Noted  . Osteopenia of hip 09/04/2016  . History of bunionectomy of left great toe 07/15/2015  . H/O hammer toe correction 03/15/2015  . Arthritis of lumbar spine (HCC) 01/19/2015  . Benign essential HTN 11/05/2014  . Carpal tunnel syndrome 11/05/2014  . Osteoarthritis 11/05/2014  . Dermatitis, eczematoid 11/05/2014  . Depression, major, recurrent, mild (HCC) 11/05/2014  . Dyslipidemia 11/05/2014  . Gastro-esophageal reflux disease without esophagitis 11/05/2014  . Extreme obesity 11/05/2014  . Dysmetabolic syndrome 11/05/2014  . Menopausal and perimenopausal disorder 11/05/2014  . Perennial allergic rhinitis with seasonal variation 11/05/2014  . Seborrhea capitis 11/05/2014  . Acquired trigger finger 11/05/2014  . Urinary incontinence in female 11/05/2014  . Vitamin D deficiency 11/05/2014  . Blood glucose elevated 08/31/2009    Past Surgical History:  Procedure Laterality Date  . ABDOMINAL HYSTERECTOMY    . CESAREAN SECTION    . COLONOSCOPY WITH PROPOFOL N/A 03/15/2016   Procedure: COLONOSCOPY WITH PROPOFOL;  Surgeon: Kieth Brightly, MD;  Location: [facility];  Service: Endoscopy;  Laterality: N/A;  . HALLUX VALGUS LAPIDUS Left 06/16/2015   Procedure: HALLUX VALGUS LAPIDUS;  Surgeon: Gwyneth Revels, DPM;  Location: [facility];  Service: Podiatry;  Laterality: Left;  WITH POPLITEAL  .  HAMMER TOE SURGERY Left 06/16/2015   Procedure: HAMMER TOE CORRECTION SECOND TOE LEFT;  Surgeon: Gwyneth RevelsJustin Fowler, DPM;  [facility];  Service: Podiatry;  Laterality: Left;  prediabetic - on oral meds  . MYOMECTOMY    . trigger finger Right 12/15/2014  . Vulva cyst  11/2006    Family History  Problem Relation Age of Onset  . Dementia Mother   . Diabetes Father   . Cancer Daughter        Fibroid Tumors  . Diabetes Brother    . Breast cancer Neg Hx     Social History   Social History  . Marital status: Married    Spouse name: N/A  . Number of children: N/A  . Years of education: N/A   Occupational History  . Not on file.   Social History Main Topics  . Smoking status: Never Smoker  . Smokeless tobacco: Never Used  . Alcohol use No  . Drug use: No  . Sexual activity: Yes    Partners: Male   Other Topics Concern  . Not on file   Social History Narrative  . No narrative on file     Current Outpatient Prescriptions:  .  acetaminophen (TYLENOL) 500 MG tablet, Take by mouth. Reported on 11/15/2015, Disp: , Rfl:  .  atorvastatin (LIPITOR) 40 MG tablet, Take 1 tablet (40 mg total) by mouth every evening. for cholesterol, Disp: 90 tablet, Rfl: 1 .  Cholecalciferol (VITAMIN D) 2000 units CAPS, Take 1 capsule by mouth daily., Disp: , Rfl:  .  Cyanocobalamin (B-12) 1000 MCG SUBL, Place 1 each under the tongue once a week. , Disp: , Rfl:  .  fluticasone (FLONASE) 50 MCG/ACT nasal spray, Place 2 sprays into both nostrils daily., Disp: 48 g, Rfl: 0 .  glucose blood (ONETOUCH VERIO) test strip, Use as instructed, Disp: 100 each, Rfl: 12 .  loratadine (CLARITIN) 10 MG tablet, TAKE 1 TABLET (10 MG TOTAL) BY MOUTH DAILY., Disp: 90 tablet, Rfl: 1 .  metFORMIN (GLUCOPHAGE) 500 MG tablet, Take 1 tablet (500 mg total) by mouth 2 (two) times daily., Disp: 180 tablet, Rfl: 1 .  nadolol (CORGARD) 20 MG tablet, Take 1 tablet (20 mg total) by mouth every evening., Disp: 90 tablet, Rfl: 1 .  triamcinolone cream (KENALOG) 0.1 %, Reported on 07/15/2015, Disp: , Rfl:  .  citalopram (CELEXA) 20 MG tablet, Take 1 tablet (20 mg total) by mouth daily., Disp: 30 tablet, Rfl: 3 .  Olmesartan-Amlodipine-HCTZ 20-5-12.5 MG TABS, Take 1 tablet by mouth daily., Disp: 90 tablet, Rfl: 0  Allergies  Allergen Reactions  . Duloxetine     sweat     ROS  Constitutional: Negative for fever or significant  weight change.  Respiratory:  Negative for cough and shortness of breath.   Cardiovascular: Negative for chest pain or palpitations.  Gastrointestinal: Negative for abdominal pain, no bowel changes.  Musculoskeletal: Negative for gait problem or joint swelling.  Skin: Negative for rash.  Neurological: Negative for dizziness or headache.  No other specific complaints in a complete review of systems (except as listed in HPI above).  Objective  Vitals:   11/14/16 0926  BP: 126/72  Pulse: 79  Resp: 16  Temp: 97.8 F (36.6 C)  TempSrc: Oral  SpO2: 98%  Weight: 208 lb 8 oz (94.6 kg)  Height: 5\' 2"  (1.575 m)    Body mass index is 38.14 kg/m.  Physical Exam  Constitutional: Patient appears well-developed and well-nourished. Obese  No distress.  HEENT: head atraumatic, normocephalic, pupils equal and reactive to light,neck supple, throat within normal limits Cardiovascular: Normal rate, regular rhythm and normal heart sounds.  No murmur heard. No BLE edema. Pulmonary/Chest: Effort normal and breath sounds normal. No respiratory distress. Abdominal: Soft.  There is no tenderness. Psychiatric: Patient has a normal mood and affect. behavior is normal. Judgment and thought content normal.  PHQ2/9: Depression screen [facility] 2/9 11/14/2016 08/10/2016 06/08/2016 02/22/2016 01/17/2016  Decreased Interest 0 0 0 1 0  Down, Depressed, Hopeless 0 0 0 1 0  PHQ - 2 Score 0 0 0 2 0  Altered sleeping - - - 0 0  Tired, decreased energy - - - 0 0  Change in appetite - - - 0 0  Feeling bad or failure about yourself  - - - 0 0  Trouble concentrating - - - 0 0  Moving slowly or fidgety/restless - - - 0 0  Suicidal thoughts - - - 0 0  PHQ-9 Score - - - 2 0  Difficult doing work/chores - - - Not difficult at all -     Fall Risk: Fall Risk  11/14/2016 08/10/2016 06/08/2016 02/22/2016 01/17/2016  Falls in the past year? No No No Yes Yes  Number falls in past yr: - - - 1 1  Injury with Fall? - - - No No  Follow up - - - Falls evaluation  completed -     Functional Status Survey: Is the patient deaf or have difficulty hearing?: No Does the patient have difficulty seeing, even when wearing glasses/contacts?: No Does the patient have difficulty concentrating, remembering, or making decisions?: No Does the patient have difficulty walking or climbing stairs?: No Does the patient have difficulty dressing or bathing?: No Does the patient have difficulty doing errands alone such as visiting a doctor's office or shopping?: No   Assessment & Plan  1. Benign essential HTN  Valsartan has been recalled so we will change from exforge to Tribenzor. - Olmesartan-Amlodipine-HCTZ 20-5-12.5 MG TABS; Take 1 tablet by mouth daily.  Dispense: 90 tablet; Refill: 0 - nadolol (CORGARD) 20 MG tablet; Take 1 tablet (20 mg total) by mouth every evening.  Dispense: 90 tablet; Refill: 1 - COMPLETE METABOLIC PANEL WITH GFR - CBC with Differential/Platelet  2. Dysmetabolic syndrome  - metFORMIN (GLUCOPHAGE) 500 MG tablet; Take 1 tablet (500 mg total) by mouth 2 (two) times daily.  Dispense: 180 tablet; Refill: 1 - Hemoglobin A1c - Insulin, fasting  3. Dyslipidemia  - atorvastatin (LIPITOR) 40 MG tablet; Take 1 tablet (40 mg total) by mouth every evening. for cholesterol  Dispense: 90 tablet; Refill: 1  4. Depression, major, recurrent, mild (HCC)  - citalopram (CELEXA) 20 MG tablet; Take 1 tablet (20 mg total) by mouth daily.  Dispense: 30 tablet; Refill: 3  5. Vitamin D deficiency  Continue otc supplementation   6. B12 deficiency  Continue supplementation, but just a few times a week  7. Morbid obesity due to excess calories [facility])  Discussed with the patient the risk posed by an increased BMI. Discussed importance of portion control, calorie counting and at least 150 minutes of physical activity weekly. Avoid sweet beverages and drink more water. Eat at least 6 servings of fruit and vegetables daily   8. GAD (generalized anxiety  disorder)  - citalopram (CELEXA) 20 MG tablet; Take 1 tablet (20 mg total) by mouth daily.  Dispense: 30 tablet; Refill: 3  9. Hair loss  - TSH  10. Hyperglycemia  - Hemoglobin A1c - Insulin, fasting

## 2016-11-15 LAB — HEMOGLOBIN A1C
Hgb A1c MFr Bld: 5.9 % — ABNORMAL HIGH (ref <5.7)
MEAN PLASMA GLUCOSE: 123 mg/dL

## 2016-11-15 LAB — COMPLETE METABOLIC PANEL WITH GFR
ALBUMIN: 4.2 g/dL (ref 3.6–5.1)
ALK PHOS: 77 U/L (ref 33–130)
ALT: 13 U/L (ref 6–29)
AST: 16 U/L (ref 10–35)
BILIRUBIN TOTAL: 0.8 mg/dL (ref 0.2–1.2)
BUN: 12 mg/dL (ref 7–25)
CO2: 32 mmol/L — ABNORMAL HIGH (ref 20–31)
CREATININE: 0.79 mg/dL (ref 0.50–0.99)
Calcium: 9.5 mg/dL (ref 8.6–10.4)
Chloride: 101 mmol/L (ref 98–110)
GFR, EST NON AFRICAN AMERICAN: 79 mL/min (ref >=60)
GLUCOSE: 89 mg/dL (ref 65–99)
Potassium: 4 mmol/L (ref 3.5–5.3)
SODIUM: 140 mmol/L (ref 135–146)
TOTAL PROTEIN: 7.1 g/dL (ref 6.1–8.1)

## 2016-11-15 LAB — TSH: TSH: 1.5 mIU/L

## 2016-11-16 LAB — INSULIN, FASTING: INSULIN FASTING, SERUM: 6.3 u[IU]/mL (ref 2.0–19.6)

## 2016-11-20 ENCOUNTER — Telehealth: Payer: Self-pay | Admitting: Family Medicine

## 2016-11-20 NOTE — Telephone Encounter (Signed)
Pt returning your call. She think its pertaining to lab results

## 2016-12-06 ENCOUNTER — Other Ambulatory Visit: Payer: Self-pay | Admitting: Family Medicine

## 2016-12-06 NOTE — Telephone Encounter (Signed)
Patient requesting refill of Loratadine to [facility].  

## 2016-12-07 ENCOUNTER — Other Ambulatory Visit: Payer: Self-pay | Admitting: Family Medicine

## 2016-12-07 MED ORDER — LORATADINE 10 MG PO TABS: ORAL_TABLET | ORAL | 1 refills | Status: DC

## 2017-01-18 ENCOUNTER — Ambulatory Visit (INDEPENDENT_AMBULATORY_CARE_PROVIDER_SITE_OTHER): Payer: Medicare Other | Admitting: Family Medicine

## 2017-01-18 ENCOUNTER — Encounter: Payer: Self-pay | Admitting: Family Medicine

## 2017-01-18 VITALS — BP 124/74 | HR 65 | Temp 98.4°F | Resp 16 | Ht 62.0 in | Wt 206.2 lb

## 2017-01-18 DIAGNOSIS — B351 Tinea unguium: Secondary | ICD-10-CM | POA: Diagnosis not present

## 2017-01-18 DIAGNOSIS — Z23 Encounter for immunization: Secondary | ICD-10-CM

## 2017-01-18 DIAGNOSIS — Z0000 Encounter for general adult medical examination without abnormal findings: Secondary | ICD-10-CM

## 2017-01-18 DIAGNOSIS — Z1239 Encounter for other screening for malignant neoplasm of breast: Secondary | ICD-10-CM

## 2017-01-18 MED ORDER — TERBINAFINE HCL 250 MG PO TABS: 250.0000 mg | ORAL_TABLET | Freq: Every day | ORAL | 2 refills | Status: DC

## 2017-01-18 NOTE — Progress Notes (Signed)
Patient: Monique Baker, Female    DOB: 05-30-1950, 66 y.o.   MRN: [id number]  Visit Date: 01/18/2017  Today's Provider: Loistine Chance, MD   Chief Complaint  Patient presents with  . Annual Exam    CPE    Subjective:    HPI Monique Baker is a 66 y.o. female who presents today for her Subsequent Annual Wellness Visit. She denies hot flashes or night sweats, no vaginal discharge, no bladder problems.   Patient/Caregiver input:    She fell at a hotel bathroom 11/29/2016, hit the back of her head in the bathtub, no loss of consciousness, first fall. She states she slipped  Thick toenails: she has a long history and is getting worse and deformed, causing discomfort when wearing closed toe shoe on right side.   Review of Systems  Constitutional: Negative for fever or signficant weight change.  Respiratory: Negative for cough and shortness of breath.   Cardiovascular: Negative for chest pain or palpitations.  Gastrointestinal: Negative for abdominal pain, no bowel changes.  Musculoskeletal: Positive  For intermittent  gait problem or joint swelling.  Skin: Negative for rash.  Neurological: Negative for dizziness or headache.  No other specific complaints in a complete review of systems (except as listed in HPI above).  Past Medical History:  Diagnosis Date  . Allergy   . Depression   . GERD (gastroesophageal reflux disease)   . Hyperlipidemia   . Hypertension   . Internal hemorrhoids   . Menopausal disorder   . Osteoarthritis    hands  . Prediabetes   . Wears dentures    partial upper    Past Surgical History:  Procedure Laterality Date  . ABDOMINAL HYSTERECTOMY    . CESAREAN SECTION    . COLONOSCOPY WITH PROPOFOL N/A 03/15/2016   Procedure: COLONOSCOPY WITH PROPOFOL;  Surgeon: Christene Lye, MD;  Location: [facility];  Service: Endoscopy;  Laterality: N/A;  . HALLUX VALGUS LAPIDUS Left 06/16/2015   Procedure: HALLUX VALGUS LAPIDUS;  Surgeon: Samara Deist, DPM;  Location: [place];  Service: Podiatry;  Laterality: Left;  WITH POPLITEAL  . HAMMER TOE SURGERY Left 06/16/2015   Procedure: HAMMER TOE CORRECTION SECOND TOE LEFT;  Surgeon: Samara Deist, DPM;  Location: [place];  Service: Podiatry;  Laterality: Left;  prediabetic - on oral meds  . MYOMECTOMY    . trigger finger Right 12/15/2014  . Vulva cyst  11/2006    Family History  Problem Relation Age of Onset  . Dementia Mother   . Diabetes Father   . Cancer Daughter        Fibroid Tumors  . Diabetes Brother   . Breast cancer Neg Hx     Social History   Social History  . Marital status: Married    Spouse name: N/A  . Number of children: 1  . Years of education: N/A   Occupational History  . retired [employer]   Social History Main Topics  . Smoking status: Never Smoker  . Smokeless tobacco: Never Used  . Alcohol use No  . Drug use: No  . Sexual activity: Yes    Partners: Male   Other Topics Concern  . Not on file   Social History Narrative   Married to a Theme park manager and 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, clinical (histocompatibility and immunogenetics) )     Outpatient Encounter Prescriptions as of 01/18/2017  Medication Sig Note  . acetaminophen (TYLENOL) 500 MG  tablet Take by mouth. Reported on 11/15/2015 11/05/2014: Received from: [facility]  . atorvastatin (LIPITOR) 40 MG tablet Take 1 tablet (40 mg total) by mouth every evening. for cholesterol   . Cholecalciferol (VITAMIN D) 2000 units CAPS Take 1 capsule by mouth daily.   . citalopram (CELEXA) 20 MG tablet Take 1 tablet (20 mg total) by mouth daily.   . Cyanocobalamin (B-12) 1000 MCG SUBL Place 1 each under the tongue once a week.  11/05/2014: Received from: [facility]  . fluticasone (FLONASE) 50 MCG/ACT nasal spray Place 2 sprays into both nostrils daily.   Marland Kitchen glucose blood (ONETOUCH VERIO) test strip Use as instructed   . loratadine (CLARITIN) 10 MG tablet TAKE 1 TABLET (10 MG  TOTAL) BY MOUTH DAILY.   . metFORMIN (GLUCOPHAGE) 500 MG tablet Take 1 tablet (500 mg total) by mouth 2 (two) times daily.   . nadolol (CORGARD) 20 MG tablet Take 1 tablet (20 mg total) by mouth every evening.   . Olmesartan-Amlodipine-HCTZ 20-5-12.5 MG TABS Take 1 tablet by mouth daily.   Marland Kitchen triamcinolone cream (KENALOG) 0.1 % Reported on 07/15/2015 11/05/2014: Received from: [facility]   No facility-administered encounter medications on file as of 01/18/2017.     Allergies  Allergen Reactions  . Duloxetine     sweat    Care Team Updated in EHR: Yes  She can't recall name of eye doctor - goes to [facility]  Last Vision Exam: 11/2016 Wears corrective lenses: Yes Last Dental Exam: 05/01/218 Last Hearing Exam: today.  Wears Hearing Aids: No  Functional Ability / Safety Screening 1.  Was the timed Get Up and Go test shorter than 30 seconds?  yes 2.  Does the patient need help with the phone, transportation, shopping,      preparing meals, housework, laundry, medications, or managing money?  no 3.  Is the patient's home free of loose throw rugs in walkways, pet beds, electrical cords, etc?   yes      Grab bars in the bathroom? yes - in one bathroom      Handrails on the stairs?   yes      Adequate lighting?   yes 4.  Has the patient noticed any hearing difficulties?   no  Diet Recall and Exercise Regimen: she is now on weight watchers, struggling with giving sweet tea, but eating better. She has been using bar bells and exercising at home, also walking occasionally.   Advanced Directives: A voluntary discussion about advance care planning including the explanation and discussion of advance directives was discussed with the patient. Explanation about the health care proxy and living will was reviewed.  During this discussion, the patient was able to identify a health care proxy as her husband Monique Baker) and plans/does not plan to fill out the paperwork  required and will bring this to our office to keep on file. Does patient have a HCPOA?    no If yes, name and contact information:  Does patient have a living will or MOST form?  no  Cancer Screenings: Skin: no problems Lung:  Low Dose CT Chest recommended if Age 16-80 years, 30 pack-year currently smoking OR have quit w/in 15years. Patient does not qualify.  Lifestyle risk factor issued reviewed: Diet, exercise, weight management, advised patient smoking is not healthy, nutrition/diet.   Breast:  Up to date on Mammogram? No  Up to date of Bone Density/Dexa? Yes Colon: up to date  Additional  Screenings:  Hepatitis B/HIV/Syphillis: not a candidate Hepatitis C Screening: up to date Intimate Partner Violence: denies   Objective:   Vitals: BP 124/74 (BP Location: Left Arm, Patient Position: Sitting, Cuff Size: Large)   Pulse 65   Temp 98.4 F (36.9 C) (Oral)   Resp 16   Ht '5\' 2"'  (1.575 m)   Wt 206 lb 3.2 oz (93.5 kg)   SpO2 98%   BMI 37.71 kg/m  Body mass index is 37.71 kg/m.  No exam data present  Physical Exam Constitutional: Patient appears well-developed and well-nourished. Obese  No distress.  HEENT: head atraumatic, normocephalic, pupils equal and reactive to light, ears normal TM bilaterally, neck supple, throat within normal limits Cardiovascular: Normal rate, regular rhythm and normal heart sounds.  No murmur heard. No BLE edema. Pulmonary/Chest: Effort normal and breath sounds normal. No respiratory distress. Abdominal: Soft.  There is no tenderness. Pelvic : normal external  exam Muscular skeletal: normal rom knees, shoulders, she has decrease rom of right hip, pain with internal rotation of right hip Neurological: no focal findings Breast: no lumps or nipple discharge Skin: thick toenails, worse on first right toe Psychiatric: Patient has a normal mood and affect. behavior is normal. Judgment and thought content normal.  Cognitive Testing - 6-CIT  Correct? Score    What year is it? yes 0 Yes = 0    No = 4  What month is it? yes 0 Yes = 0    No = 3  Remember:     Pia [address]au, [place], [place]     What time is it? yes 0 Yes = 0    No = 3  Count backwards from 20 to 1 yes 0 Correct = 0    1 error = 2   More than 1 error = 4  Say the months of the year in reverse. yes 0 Correct = 0    1 error = 2   More than 1 error = 4  What address did I ask you to remember? yes 1 Correct = 0  1 error = 2    2 error = 4    3 error = 6    4 error = 8    All wrong = 10       TOTAL SCORE  1/28   Interpretation:  Normal  Normal (0-7) Abnormal (8-28)   Fall Risk: Fall Risk  01/18/2017 11/14/2016 08/10/2016 06/08/2016 02/22/2016  Falls in the past year? Yes No No No Yes  Comment - - - - -  Number falls in past yr: 1 - - - 1  Injury with Fall? Yes - - - No  Comment pt hit head - - - -  Risk for fall due to : Other (Comment) - - - -  Risk for fall due to: Comment patient fell in tub at hotel while out of town  - - - -  Follow up Falls evaluation completed - - - Falls evaluation completed    Depression Screen Depression screen [facility] 2/9 01/18/2017 11/14/2016 08/10/2016 06/08/2016 02/22/2016  Decreased Interest 0 0 0 0 1  Down, Depressed, Hopeless 0 0 0 0 1  PHQ - 2 Score 0 0 0 0 2  Altered sleeping - - - - 0  Tired, decreased energy - - - - 0  Change in appetite - - - - 0  Feeling bad or failure about yourself  - - - - 0  Trouble  concentrating - - - - 0  Moving slowly or fidgety/restless - - - - 0  Suicidal thoughts - - - - 0  PHQ-9 Score - - - - 2  Difficult doing work/chores - - - - Not difficult at all    Recent Results (from the past 2160 hour(s))  TSH     Status: None   Collection Time: 11/14/16 10:53 AM  Result Value Ref Range   TSH 1.50 mIU/L    Comment:   Reference Range   > or = 20 Years  0.40-4.50   Pregnancy Range First trimester  0.26-2.66 Second trimester 0.55-2.73 Third trimester  0.43-2.91     COMPLETE METABOLIC PANEL WITH GFR      Status: Abnormal   Collection Time: 11/14/16 10:53 AM  Result Value Ref Range   Sodium 140 135 - 146 mmol/L   Potassium 4.0 3.5 - 5.3 mmol/L   Chloride 101 98 - 110 mmol/L   CO2 32 (H) 20 - 31 mmol/L   Glucose, Bld 89 65 - 99 mg/dL   BUN 12 7 - 25 mg/dL   Creat 0.79 0.50 - 0.99 mg/dL    Comment:   For patients > or = 66 years of age: The upper reference limit for Creatinine is approximately 13% higher for people identified as African-American.      Total Bilirubin 0.8 0.2 - 1.2 mg/dL   Alkaline Phosphatase 77 33 - 130 U/L   AST 16 10 - 35 U/L   ALT 13 6 - 29 U/L   Total Protein 7.1 6.1 - 8.1 g/dL   Albumin 4.2 3.6 - 5.1 g/dL   Calcium 9.5 8.6 - 10.4 mg/dL   GFR, Est African American >89 >=60 mL/min   GFR, Est Non African American 79 >=60 mL/min  CBC with Differential/Platelet     Status: None   Collection Time: 11/14/16 10:53 AM  Result Value Ref Range   WBC 9.4 3.8 - 10.8 K/uL   RBC 4.84 3.80 - 5.10 MIL/uL   Hemoglobin 13.3 11.7 - 15.5 g/dL   HCT 41.2 35.0 - 45.0 %   MCV 85.1 80.0 - 100.0 fL   MCH 27.5 27.0 - 33.0 pg   MCHC 32.3 32.0 - 36.0 g/dL   RDW 13.9 11.0 - 15.0 %   Platelets 397 140 - 400 K/uL   MPV 8.8 7.5 - 12.5 fL   Neutro Abs 6,768 1,500 - 7,800 cells/uL   Lymphs Abs 1,786 850 - 3,900 cells/uL   Monocytes Absolute 564 200 - 950 cells/uL   Eosinophils Absolute 282 15 - 500 cells/uL   Basophils Absolute 0 0 - 200 cells/uL   Neutrophils Relative % 72 %   Lymphocytes Relative 19 %   Monocytes Relative 6 %   Eosinophils Relative 3 %   Basophils Relative 0 %   Smear Review Criteria for review not met   Hemoglobin A1c     Status: Abnormal   Collection Time: 11/14/16 10:53 AM  Result Value Ref Range   Hgb A1c MFr Bld 5.9 (H) <5.7 %    Comment:   For someone without known diabetes, a hemoglobin A1c value between 5.7% and 6.4% is consistent with prediabetes and should be confirmed with a follow-up test.   For someone with known diabetes, a value <7%  indicates that their diabetes is well controlled. A1c targets should be individualized based on duration of diabetes, age, co-morbid conditions and other considerations.   This assay result is consistent with  an increased risk of diabetes.   Currently, no consensus exists regarding use of hemoglobin A1c for diagnosis of diabetes in children.      Mean Plasma Glucose 123 mg/dL  Insulin, fasting     Status: None   Collection Time: 11/14/16 10:53 AM  Result Value Ref Range   Insulin fasting, serum 6.3 2.0 - 19.6 uIU/mL    Comment:   This insulin assay shows strong cross-reactivity for some insulin analogs (lispro, aspart, and glargine) and much lower cross-reactivity with others (detemir, glulisine).   Stimulated Insulin reference intervals were established using the Siemens Immulite assay. These values are provided for general guidance only.     Assessment & Plan:    Exercise Activities and Dietary recommendations  Stop drinking sweet tea  - Discussed health benefits of physical activity, and encouraged her to engage in regular exercise appropriate for her age and condition.   Immunization History  Administered Date(s) Administered  . Influenza, High Dose Seasonal PF 01/18/2017  . Influenza,inj,Quad PF,6+ Mos 01/11/2015, 01/17/2016  . Influenza-Unspecified 01/06/2014  . Pneumococcal Polysaccharide-23 08/31/2009  . Tdap 05/10/2010  . Zoster 12/28/2011    Health Maintenance  Topic Date Due  . HIV Screening  05/01/2019 (Originally 04/19/1966)  . PNA vac Low Risk Adult (2 of 2 - PPSV23) 06/08/2017  . PAP SMEAR  11/05/2017  . MAMMOGRAM  03/29/2018  . TETANUS/TDAP  05/10/2020  . COLONOSCOPY  03/15/2026  . INFLUENZA VACCINE  Completed  . DEXA SCAN  Completed  . Hepatitis C Screening  Completed    No orders of the defined types were placed in this encounter.   Current Outpatient Prescriptions:  .  acetaminophen (TYLENOL) 500 MG tablet, Take by mouth. Reported on  11/15/2015, Disp: , Rfl:  .  atorvastatin (LIPITOR) 40 MG tablet, Take 1 tablet (40 mg total) by mouth every evening. for cholesterol, Disp: 90 tablet, Rfl: 1 .  Cholecalciferol (VITAMIN D) 2000 units CAPS, Take 1 capsule by mouth daily., Disp: , Rfl:  .  citalopram (CELEXA) 20 MG tablet, Take 1 tablet (20 mg total) by mouth daily., Disp: 30 tablet, Rfl: 3 .  Cyanocobalamin (B-12) 1000 MCG SUBL, Place 1 each under the tongue once a week. , Disp: , Rfl:  .  fluticasone (FLONASE) 50 MCG/ACT nasal spray, Place 2 sprays into both nostrils daily., Disp: 48 g, Rfl: 0 .  glucose blood (ONETOUCH VERIO) test strip, Use as instructed, Disp: 100 each, Rfl: 12 .  loratadine (CLARITIN) 10 MG tablet, TAKE 1 TABLET (10 MG TOTAL) BY MOUTH DAILY., Disp: 90 tablet, Rfl: 1 .  metFORMIN (GLUCOPHAGE) 500 MG tablet, Take 1 tablet (500 mg total) by mouth 2 (two) times daily., Disp: 180 tablet, Rfl: 1 .  nadolol (CORGARD) 20 MG tablet, Take 1 tablet (20 mg total) by mouth every evening., Disp: 90 tablet, Rfl: 1 .  Olmesartan-Amlodipine-HCTZ 20-5-12.5 MG TABS, Take 1 tablet by mouth daily., Disp: 90 tablet, Rfl: 0 .  triamcinolone cream (KENALOG) 0.1 %, Reported on 07/15/2015, Disp: , Rfl:  There are no discontinued medications.   1. Medicare annual wellness visit, subsequent  Discussed importance of 150 minutes of physical activity weekly, eat two servings of fish weekly, eat one serving of tree nuts ( cashews, pistachios, pecans, almonds.Marland Kitchen) every other day, eat 6 servings of fruit/vegetables daily and drink plenty of water and avoid sweet beverages.   2. Needs flu shot  - Flu vaccine HIGH DOSE P   3. Onychomycosis  - terbinafine (LAMISIL) 250  MG tablet; Take 1 tablet (250 mg total) by mouth daily.  Dispense: 30 tablet; Refill: 2 Discussed importance of checking liver enzymes on her next visit  5. Breast cancer screening  - MM Digital Screening; Future   I have personally reviewed and addressed the Medicare  Annual Wellness health risk assessment questionnaire and have noted the following in the patient's chart:  A.         Medical and social history & family history B.         Use of alcohol, tobacco, and illicit drugs  C.         Current medications and supplements D.         Functional and Cognitive ability and status E.         Nutritional status F.         Physical activity G.        Advance directives H.         List of other physicians I.          Hospitalizations, surgeries, and ER visits in previous 12 months J.         [place] such as hearing, vision, cognitive function, and depression L.         Referrals and appointments: none  In addition, I have reviewed and discussed with patient certain preventive protocols, quality metrics, and best practice recommendations. A written personalized care plan for preventive services as well as general preventive health recommendations were provided to patient.  See attached scanned questionnaire for additional information.   No Follow-up on file.

## 2017-02-12 ENCOUNTER — Other Ambulatory Visit: Payer: Self-pay | Admitting: Family Medicine

## 2017-02-12 DIAGNOSIS — I10 Essential (primary) hypertension: Secondary | ICD-10-CM

## 2017-02-14 ENCOUNTER — Ambulatory Visit: Payer: Medicare Other | Admitting: Family Medicine

## 2017-03-15 ENCOUNTER — Other Ambulatory Visit: Payer: Self-pay | Admitting: Family Medicine

## 2017-03-15 DIAGNOSIS — F330 Major depressive disorder, recurrent, mild: Secondary | ICD-10-CM

## 2017-03-15 DIAGNOSIS — F411 Generalized anxiety disorder: Secondary | ICD-10-CM

## 2017-03-15 NOTE — Telephone Encounter (Signed)
Refill request for general medication: Celexa to [facility].   Last office visit: 01/18/2017  Next visit: 03/19/2017

## 2017-03-19 ENCOUNTER — Encounter: Payer: Self-pay | Admitting: Family Medicine

## 2017-03-19 ENCOUNTER — Ambulatory Visit: Payer: Medicare Other | Admitting: Family Medicine

## 2017-03-19 VITALS — BP 128/72 | HR 68 | Temp 98.5°F | Resp 16 | Ht 62.0 in | Wt 205.5 lb

## 2017-03-19 DIAGNOSIS — Z79899 Other long term (current) drug therapy: Secondary | ICD-10-CM | POA: Diagnosis not present

## 2017-03-19 DIAGNOSIS — E785 Hyperlipidemia, unspecified: Secondary | ICD-10-CM | POA: Diagnosis not present

## 2017-03-19 DIAGNOSIS — E8881 Metabolic syndrome: Secondary | ICD-10-CM | POA: Diagnosis not present

## 2017-03-19 DIAGNOSIS — F411 Generalized anxiety disorder: Secondary | ICD-10-CM

## 2017-03-19 DIAGNOSIS — F330 Major depressive disorder, recurrent, mild: Secondary | ICD-10-CM

## 2017-03-19 DIAGNOSIS — M1611 Unilateral primary osteoarthritis, right hip: Secondary | ICD-10-CM

## 2017-03-19 DIAGNOSIS — R739 Hyperglycemia, unspecified: Secondary | ICD-10-CM | POA: Diagnosis not present

## 2017-03-19 DIAGNOSIS — I10 Essential (primary) hypertension: Secondary | ICD-10-CM

## 2017-03-19 LAB — COMPLETE METABOLIC PANEL WITH GFR
AG Ratio: 1.5 (calc) (ref 1.0–2.5)
ALKALINE PHOSPHATASE (APISO): 66 U/L (ref 33–130)
ALT: 12 U/L (ref 6–29)
AST: 13 U/L (ref 10–35)
Albumin: 4.1 g/dL (ref 3.6–5.1)
BUN: 11 mg/dL (ref 7–25)
CHLORIDE: 103 mmol/L (ref 98–110)
CO2: 31 mmol/L (ref 20–32)
CREATININE: 0.71 mg/dL (ref 0.50–0.99)
Calcium: 9.6 mg/dL (ref 8.6–10.4)
GFR, Est African American: 104 mL/min/{1.73_m2} (ref >=60)
GFR, Est Non African American: 89 mL/min/{1.73_m2} (ref >=60)
GLOBULIN: 2.8 g/dL (ref 1.9–3.7)
Glucose, Bld: 97 mg/dL (ref 65–99)
POTASSIUM: 3.7 mmol/L (ref 3.5–5.3)
SODIUM: 142 mmol/L (ref 135–146)
Total Bilirubin: 0.6 mg/dL (ref 0.2–1.2)
Total Protein: 6.9 g/dL (ref 6.1–8.1)

## 2017-03-19 MED ORDER — METFORMIN HCL 500 MG PO TABS: 500.0000 mg | ORAL_TABLET | Freq: Two times a day (BID) | ORAL | 1 refills | Status: DC

## 2017-03-19 MED ORDER — OLMESARTAN-AMLODIPINE-HCTZ 20-5-12.5 MG PO TABS: 1.0000 | ORAL_TABLET | Freq: Every day | ORAL | 0 refills | Status: DC

## 2017-03-19 MED ORDER — ATORVASTATIN CALCIUM 40 MG PO TABS: 40.0000 mg | ORAL_TABLET | Freq: Every evening | ORAL | 1 refills | Status: DC

## 2017-03-19 MED ORDER — NADOLOL 20 MG PO TABS: 20.0000 mg | ORAL_TABLET | Freq: Every evening | ORAL | 1 refills | Status: DC

## 2017-03-19 MED ORDER — CITALOPRAM HYDROBROMIDE 20 MG PO TABS: 20.0000 mg | ORAL_TABLET | Freq: Every day | ORAL | 1 refills | Status: DC

## 2017-03-19 NOTE — Progress Notes (Signed)
Name: Monique Baker   MRN: [id number]    DOB: 01/04/1951   Date:03/19/2017       Progress Note  Subjective  Chief Complaint  Chief Complaint  Patient presents with  . Medication Refill  . Hip Pain    Onset-1 years, Right Hip and will give out on her when walking too much.  . Hypertension    Denies any symptoms  . Depression  . Allergic Rhinitis     Worse this time of year    HPI  Pre diabetes/metabolic syndrome: she is taking Metformin, denies side effects, hgbA1C was 5.9%, no polyphagia, polydipsia or polyuria. Also going to weight watchers, she cheats at times - explained that it is normal   Right hip pain/OA: aggravated by activity, such as standing for a long time, walking helps with pain. Not taking medications at this time, seen by Dr. Hyacinth MeekerMiller in the past, needs to lose weight. Discussed GLP-1 agonist but she wants to hold off for now, having right quad pain and getting more gait disturbance secondary to pain, advised to avoid nsaid's , she is willing to start PT  Extreme obesity:she had lost 11lbs since she joined weight watchers 05/2016 and is doing well. Not drinking sweet beverages on a regular basis, counting points, eating more fruit and vegetables. She has not started physical activity at this time.   HTN: taking bp medication, denies side effects, no chest pain, no palpitation. She is complaint with medication. BP is at goal ,she is compliant with medication   Depression Major: she is married to a Education officer, environmentalpastor, he also works as a Airline pilotprofessor at [employer]. She is only baby-sitting grandchildren prn and that is not as stressful now. We started her on Cymbalta 10/2015 and is feeling better no longer has anhedonia, motivation is back, no longer having crying spells, and not feeling so hungry, however has hot flashes and hair loss and is concerned about possible side effects, we switched to Celexa and hot flashes resolved and hair is growing again. She is in remission    Patient Active Problem List   Diagnosis Date Noted  . Osteopenia of hip 09/04/2016  . History of bunionectomy of left great toe 07/15/2015  . H/O hammer toe correction 03/15/2015  . Arthritis of lumbar spine 01/19/2015  . Benign essential HTN 11/05/2014  . Carpal tunnel syndrome 11/05/2014  . Osteoarthritis 11/05/2014  . Dermatitis, eczematoid 11/05/2014  . Depression, major, recurrent, mild (HCC) 11/05/2014  . Dyslipidemia 11/05/2014  . Gastro-esophageal reflux disease without esophagitis 11/05/2014  . Extreme obesity 11/05/2014  . Dysmetabolic syndrome 11/05/2014  . Menopausal and perimenopausal disorder 11/05/2014  . Perennial allergic rhinitis with seasonal variation 11/05/2014  . Seborrhea capitis 11/05/2014  . Acquired trigger finger 11/05/2014  . Urinary incontinence in female 11/05/2014  . Vitamin D deficiency 11/05/2014  . Blood glucose elevated 08/31/2009    Past Surgical History:  Procedure Laterality Date  . ABDOMINAL HYSTERECTOMY    . CESAREAN SECTION    . COLONOSCOPY WITH PROPOFOL N/A 03/15/2016   Performed by Kieth BrightlySankar, Seeplaputhur G, MD at [facility]  . HALLUX VALGUS LAPIDUS Left 06/16/2015   Performed [facility] Gwyneth RevelsFowler, Justin, [facility]  . HAMMER TOE CORRECTION SECOND TOE LEFT Left 06/16/2015   Performed by Gwyneth RevelsFowler, Justin[facility]  . MYOMECTOMY    . trigger finger Right 12/15/2014  . Vulva cyst  11/2006    Family History  Problem Relation Age of Onset  .  Dementia Mother   . Diabetes Father   . Cancer Daughter        Fibroid Tumors  . Diabetes Brother   . Breast cancer Neg Hx     Social History   Socioeconomic History  . Marital status: Married    Spouse name: Not on file  . Number of children: 1  . Years of education: Not on file  . Highest education level: Not on file  Social Needs  . Financial resource strain: Not on file  . Food insecurity - worry: Not on file  . Food insecurity - inability: Not on file   . Transportation needs - medical: Not on file  . Transportation needs - non-medical: Not on file  Occupational History  . Occupation: retired Runner, broadcasting/film/videoteacher    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  Tobacco Use  . Smoking status: Never Smoker  . Smokeless tobacco: Never Used  Substance and Sexual Activity  . Alcohol use: No    Alcohol/week: 0.0 oz  . Drug use: No  . Sexual activity: Yes    Partners: Male  Other Topics Concern  . 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  acetaminophen (TYLENOL) 500 MG tablet, Take by mouth. Reported on 11/15/2015, Disp: , Rfl:  .  aspirin EC 81 MG tablet, Take 81 mg daily by mouth., Disp: , Rfl:  .  atorvastatin (LIPITOR) 40 MG tablet, Take 1 tablet (40 mg total) by mouth every evening. for cholesterol, Disp: 90 tablet, Rfl: 1 .  Cholecalciferol (VITAMIN D) 2000 units CAPS, Take 1 capsule by mouth daily., Disp: , Rfl:  .  citalopram (CELEXA) 20 MG tablet, TAKE 1 TABLET BY MOUTH EVERY DAY, Disp: 30 tablet, Rfl: 3 .  Cyanocobalamin (B-12) 1000 MCG SUBL, Place 1 each under the tongue once a week. , Disp: , Rfl:  .  fluticasone (FLONASE) 50 MCG/ACT nasal spray, Place 2 sprays into both nostrils daily., Disp: 48 g, Rfl: 0 .  glucose blood (ONETOUCH VERIO) test strip, Use as instructed, Disp: 100 each, Rfl: 12 .  loratadine (CLARITIN) 10 MG tablet, TAKE 1 TABLET (10 MG TOTAL) BY MOUTH DAILY., Disp: 90 tablet, Rfl: 1 .  metFORMIN (GLUCOPHAGE) 500 MG tablet, Take 1 tablet (500 mg total) by mouth 2 (two) times daily., Disp: 180 tablet, Rfl: 1 .  nadolol (CORGARD) 20 MG tablet, Take 1 tablet (20 mg total) by mouth every evening., Disp: 90 tablet, Rfl: 1 .  Olmesartan-Amlodipine-HCTZ 20-5-12.5 MG TABS, TAKE 1 TABLET BY MOUTH EVERY DAY, Disp: 90 tablet, Rfl: 0 .  triamcinolone cream (KENALOG) 0.1 %, Reported on 07/15/2015, Disp: , Rfl:   Allergies  Allergen Reactions  .  Duloxetine     sweat     ROS  Constitutional: Negative for fever or weight change.  Respiratory: Negative for cough and shortness of breath.   Cardiovascular: Negative for chest pain or palpitations.  Gastrointestinal: Negative for abdominal pain, no bowel changes.  Musculoskeletal: Positive for gait problem but no  joint swelling.  Skin: Negative for rash.  Neurological: Negative for dizziness or headache.  No other specific complaints in a complete review of systems (except as listed in HPI above).  Objective  Vitals:   03/19/17 0917  BP: 128/72  Pulse: 68  Resp: 16  Temp: 98.5 F (36.9 C)  TempSrc: Oral  SpO2: 96%  Weight: 205  lb 8 oz (93.2 kg)  Height: 5\' 2"  (1.575 m)    Body mass index is 37.59 kg/m.  Physical Exam  Constitutional: Patient appears well-developed and well-nourished. Obese  No distress.  HEENT: head atraumatic, normocephalic, pupils equal and reactive to light,  neck supple, throat within normal limits Cardiovascular: Normal rate, regular rhythm and normal heart sounds.  No murmur heard. No BLE edema. Pulmonary/Chest: Effort normal and breath sounds normal. No respiratory distress. Abdominal: Soft.  There is no tenderness. Psychiatric: Patient has a normal mood and affect. behavior is normal. Judgment and thought content normal. Muscular Skeletal: pain with internal and external rotation of right hip, antalgic gait when she first got up to walk.   PHQ2/9: Depression screen [facility] 2/9 03/19/2017 01/18/2017 11/14/2016 08/10/2016 06/08/2016  Decreased Interest 0 0 0 0 0  Down, Depressed, Hopeless 0 0 0 0 0  PHQ - 2 Score 0 0 0 0 0  Altered sleeping - - - - -  Tired, decreased energy - - - - -  Change in appetite - - - - -  Feeling bad or failure about yourself  - - - - -  Trouble concentrating - - - - -  Moving slowly or fidgety/restless - - - - -  Suicidal thoughts - - - - -  PHQ-9 Score - - - - -  Difficult doing work/chores - - - - -     Fall  Risk: Fall Risk  03/19/2017 01/18/2017 11/14/2016 08/10/2016 06/08/2016  Falls in the past year? Yes Yes No No No  Comment - - - - -  Number falls in past yr: 1 1 - - -  Injury with Fall? Yes Yes - - -  Comment - pt hit head - - -  Risk for fall due to : - Other (Comment) - - -  Risk for fall due to: Comment - patient fell in tub at hotel while out of town  - - -  Follow up - Falls evaluation completed - - -     Assessment & Plan  1. Benign essential HTN  - nadolol (CORGARD) 20 MG tablet; Take 1 tablet (20 mg total) every evening by mouth.  Dispense: 90 tablet; Refill: 1 - Olmesartan-Amlodipine-HCTZ 20-5-12.5 MG TABS; Take 1 tablet daily by mouth.  Dispense: 90 tablet; Refill: 0  2. Dyslipidemia  - atorvastatin (LIPITOR) 40 MG tablet; Take 1 tablet (40 mg total) every evening by mouth. for cholesterol  Dispense: 90 tablet; Refill: 1  3. Depression, major, recurrent, mild (HCC)  - citalopram (CELEXA) 20 MG tablet; Take 1 tablet (20 mg total) daily by mouth.  Dispense: 90 tablet; Refill: 1  4. Dysmetabolic syndrome  - metFORMIN (GLUCOPHAGE) 500 MG tablet; Take 1 tablet (500 mg total) 2 (two) times daily by mouth.  Dispense: 180 tablet; Refill: 1  5. Morbid obesity due to excess calories (HCC)  Still going to weight watchers  6. Primary osteoarthritis of right hip  - Ambulatory referral to Physical Therapy  7. Blood glucose elevated  hgbA1C was stable, continue Metformin   8. Long-term use of high-risk medication  - COMPLETE METABOLIC PANEL WITH GFR  9. GAD (generalized anxiety disorder)  - citalopram (CELEXA) 20 MG tablet; Take 1 tablet (20 mg total) daily by mouth.  Dispense: 90 tablet; Refill: 1

## 2017-03-28 ENCOUNTER — Ambulatory Visit: Payer: Medicare Other | Attending: Family Medicine

## 2017-03-28 VITALS — BP 131/57 | HR 65

## 2017-03-28 DIAGNOSIS — R262 Difficulty in walking, not elsewhere classified: Secondary | ICD-10-CM | POA: Diagnosis present

## 2017-03-28 DIAGNOSIS — M25551 Pain in right hip: Secondary | ICD-10-CM

## 2017-03-28 NOTE — Patient Instructions (Signed)
  Abduction: Side Leg Lift (Eccentric) - Side-Lying    Lie on side. Lift top leg slightly higher than shoulder level. Keep top leg straight with body, toes pointing forward.   __10_ reps per set, __2_ sets per day  http://ecce.exer.us/62   Copyright  VHI. All rights reserved.    

## 2017-03-28 NOTE — Therapy (Signed)
[facility] PH[facility] AND SPORTS MEDICINE [address] [place], [address] Phone: [phone]   Fax:  [phone]  Physical Therapy Evaluation  Patient Details  Name: Monique Baker MRN: [id number] Date of Birth: 12-23-1950 Referring Provider: Alba Cory, MD   Encounter Date: 03/28/2017  PT End of Session - 03/28/17 0926    Visit Number  1    Number of Visits  13    Date for PT Re-Evaluation  05/10/17    Authorization Type  1    Authorization Time Period  of 10 G-code    PT Start Time  0926    PT Stop Time  1032    PT Time Calculation (min)  66 min    Activity Tolerance  Patient tolerated treatment well    Behavior During Therapy  [facility] for tasks assessed/performed       Past Medical History:  Diagnosis Date  . Allergy   . Depression   . GERD (gastroesophageal reflux disease)   . Hyperlipidemia   . Hypertension   . Internal hemorrhoids   . Menopausal disorder   . Osteoarthritis    hands  . Prediabetes   . Wears dentures    partial upper    Past Surgical History:  Procedure Laterality Date  . ABDOMINAL HYSTERECTOMY    . CESAREAN SECTION    . COLONOSCOPY WITH PROPOFOL N/A 03/15/2016   Procedure: COLONOSCOPY WITH PROPOFOL;  Surgeon: Kieth Brightly, MD;  Location: [facility] ENDOSCOPY;  Service: Endoscopy;  Laterality: N/A;  . HALLUX VALGUS LAPIDUS Left 06/16/2015   Procedure: HALLUX VALGUS LAPIDUS;  Surgeon: Gwyneth Revels, DPM;  Location: [facility];  Service: Podiatry;  Laterality: Left;  WITH POPLITEAL  . HAMMER TOE SURGERY Left 06/16/2015   Procedure: HAMMER TOE CORRECTION SECOND TOE LEFT;  Surgeon: Gwyneth Revels, DPM;  Location: [facility];  Service: Podiatry;  Laterality: Left;  prediabetic - on oral meds  . MYOMECTOMY    . trigger finger Right 12/15/2014  . Vulva cyst  11/2006    Vitals:   03/28/17 0933  BP: (!) 131/57  Pulse: 65     Subjective Assessment - 03/28/17 0936    Subjective  R  hip pain: 1/10 currently (pt sitting), 0/10 at best for the past 2 months (sitting for a while), 4/10 at worst for the past 2 months (feels like a pinch in her hip)    Pertinent History  R hip osteoarthritis. A couple of years ago, pt would have soreness in her R posterior hip. Had x-rays and was told that she has a hairline fracture which is healing itself. Pain stopped hurting afterwards. Pain however started from her R posterior hip to anterior hip at the joint area. Went to another doctor and was told that she has arthritis. Sometimes feels pain R medial thigh but not past her knee. Feels like her R leg would give way at times but does not fall. Her doctor does not think that she needs surgery for her R hip.  Did a lot of fixing up around her house yesterday and was on her hip a lot which bothered her hip.   Denies back pain.  Denies unexplained changes in weight.  Pain does not wake her up at night.   Pt assumes that her R hip hairline fracture is healed.   Pt husband is a Education officer, environmental and feels like she is in the "lime light." Wants to be able to walk better so people  do not have to ask her if she is ok as much.     Patient Stated Goals  Walk straight without limping.     Currently in Pain?  Yes    Pain Score  1     Pain Location  Hip    Pain Orientation  Right    Pain Type  Chronic pain    Pain Onset  More than a month ago    Pain Frequency  Occasional    Aggravating Factors   being on her R hip a lot such as standing and fixing up around her house, walking about 5 minutes, going up stairs > going down stairs, picking up items from the floor, R hip flexion movements.     Pain Relieving Factors  Sitting, Aleve > Tylenol Arthritis, sleeping on a recliner, sleeping either on her R or L side.          [facility] PT Assessment - 03/28/17 0951      Assessment   Medical Diagnosis  Primary osteoarthritis of R hip    Referring Provider  Alba CoryKrichna Sowles, MD    Onset Date/Surgical Date  -- about 2 years ago per  pt (2016)    Prior Therapy  No known PT for current condition      Precautions   Precaution Comments  No known precautions       Restrictions   Other Position/Activity Restrictions  No known restrictions      Balance Screen   Has the patient fallen in the past 6 months  Yes    How many times?  1 pt slipped on a tub 1x    Has the patient had a decrease in activity level because of a fear of falling?   No    Is the patient reluctant to leave their home because of a fear of falling?   No      Home Environment   Additional Comments  Pt lives in a 2 story home with her husband. 2 steps to enter with bilateral rail. 2 flights of stairs inside with bilateral rail.      Prior Function   Vocation  Retired retired Radiation protection practitionerhigh school teacher    Vocation Requirements  PLOF: better able to ambulate longer distances/periods, negotiate steps, pick up items from the floor with less hip pain.     Leisure  read      Observation/Other Assessments   Lower Extremity Functional Scale   37/80      Posture/Postural Control   Posture Comments  protracted neck and shoulders, R knee in slight flexion, R hip in ER, bilateral foot pronation L > R      AROM   Overall AROM Comments  hip flexion reproduces symptoms.     Lumbar Flexion  full with R anterior hip joint pulling no pain with slouching while sitting    Lumbar Extension  WFL    Lumbar - Right Side [facility]  WFL with R low back pain    Lumbar - Left Side Bend  WFL    Lumbar - Right Rotation  WFL sitting    Lumbar - Left Rotation  WFL sitting      PROM   Overall PROM Comments  hip in 90/90: -11 degrees R hip IR, 30 degrees L hip IR      Strength   Right Hip Flexion  4/5 with slight hip pain reproduction    Right Hip Extension  4+/5 seated manually resisted leg press  Right Hip ABduction  4-/5    Left Hip Flexion  4/5    Left Hip Extension  4/5 seated manually resisted leg press    Left Hip ABduction  4-/5    Right Knee Flexion  5/5    Right Knee  Extension  5/5    Left Knee Flexion  5/5    Left Knee Extension  5/5      Palpation   Palpation comment  No TTP bilateral anterior, lateral, and posterior hip and thighs. Increased vastus lateralis tension R compared to L      Ambulation/Gait   Gait Comments  independent, antalgic with decreased stance R LE, R lateral lean and forward flexion during stance phase R LE             Objective measurements completed on examination: See above findings.   Ther-ex  Standing on stair step with R LE hanging off, 2 lbs ankle weight 1 minute x3 to promote joint space  seated R hip IR, R foot on slider 10x3   Standing R hip abduction 5x (easy)   L S/L R hip abduction 10x2  Reviewed and given as part of her HEP. Pt demonstrated and verbalized understanding.     Improved exercise technique, movement at target joints, use of target muscles after mod verbal, visual, tactile cues.   Felt good exercises soreness afterwards per pt.      PT Education - 03/28/17 1306    Education provided  Yes    Education Details  ther-ex, HEP    Person(s) Educated  Patient    Methods  Explanation;Demonstration;Tactile cues;Verbal cues;Handout    Comprehension  Verbalized understanding;Returned demonstration          PT Long Term Goals - 03/28/17 1145      PT LONG TERM GOAL #1   Title  Pt will have a decrease in R hip pain to 2/10 or less at worst to promote ability to ambulate, negotiate stairs, pick up items from the floor.     Baseline  4/10 R hip pain at worst for the past 2 months (03/28/2017)    Time  6    Period  Weeks    Status  New    Target Date  05/10/17      PT LONG TERM GOAL #2   Title  Pt will improve R hip abduction and extension strength by at least 1/2 MMT grade to promote ability to ambulate with less R hip pain.    Time  6    Period  Weeks    Status  New    Target Date  05/10/17      PT LONG TERM GOAL #3   Title  Pt will improve her LEFS score by at least 9 points as  a demonstration of improved function.     Baseline  37/80 (03/28/2017)    Time  6    Period  Weeks    Status  New    Target Date  05/10/17      PT LONG TERM GOAL #4   Title  Pt will report being able to tolerate walking at least 10 minutes to promote mobility.     Baseline  Pt reports walking 5 minutes bothers her R hip 03/28/2017.     Time  6    Period  Weeks    Status  New    Target Date  05/10/17  Plan - 21-Apr-2017 1140    Clinical Impression Statement  Patient is a 66 year old female who came to physical therapy secondary to R hip osteoarthritis. She also presents with R hip pain, hip weakness, limited R hip IR ROM, altered gait pattern and posture, reproduction of symptoms with hip flexion related movements, and difficulty performing functional tasks such as walking, negotiating stairs, and picking up items from the floor secondary to hip pain. Patient will benefit from skilled physical therapy services to address the aforementioned deficits.     History and Personal Factors relevant to plan of care:  Chronicity of condition    Clinical Presentation  Stable    Clinical Presentation due to:  pain is worse per medical screening form    Clinical Decision Making  Low    Rehab Potential  Fair    Clinical Impairments Affecting Rehab Potential  Chronicity of condition    PT Frequency  2x / week    PT Duration  6 weeks    PT Treatment/Interventions  Therapeutic activities;Therapeutic exercise;Neuromuscular re-education;Aquatic Therapy;Electrical Stimulation;Iontophoresis 4mg /ml Dexamethasone;Ultrasound;Gait training;Stair training;Patient/family education;Manual techniques;Dry needling;Traction traction if appropriate    PT Next Visit Plan  Hip ROM, glute strengthening    Consulted and Agree with Plan of Care  Patient       Patient will benefit from skilled therapeutic intervention in order to improve the following deficits and impairments:  Pain, Improper body mechanics,  Difficulty walking, Decreased strength  Visit Diagnosis: Pain in right hip - Plan: PT plan of care cert/re-cert  Difficulty in walking, not elsewhere classified - Plan: PT plan of care cert/re-cert  G-Codes - 21-Apr-2017 1153    Functional Assessment Tool Used (Outpatient Only)  LEFS, clinical presentation, patient interview    Functional Limitation  Mobility: Walking and moving around    Mobility: Walking and Moving Around Current Status (E4540)  At least 40 percent but less than 60 percent impaired, limited or restricted    Mobility: Walking and Moving Around Goal Status [phone])  At least 20 percent but less than 40 percent impaired, limited or restricted        Problem List Patient Active Problem List   Diagnosis Date Noted  . Osteopenia of hip 09/04/2016  . History of bunionectomy of left great toe 07/15/2015  . H/O hammer toe correction 03/15/2015  . Arthritis of lumbar spine 01/19/2015  . Benign essential HTN 11/05/2014  . Carpal tunnel syndrome 11/05/2014  . Osteoarthritis 11/05/2014  . Dermatitis, eczematoid 11/05/2014  . Depression, major, recurrent, mild (HCC) 11/05/2014  . Dyslipidemia 11/05/2014  . Gastro-esophageal reflux disease without esophagitis 11/05/2014  . Extreme obesity 11/05/2014  . Dysmetabolic syndrome 11/05/2014  . Menopausal and perimenopausal disorder 11/05/2014  . Perennial allergic rhinitis with seasonal variation 11/05/2014  . Seborrhea capitis 11/05/2014  . Acquired trigger finger 11/05/2014  . Urinary incontinence in female 11/05/2014  . Vitamin D deficiency 11/05/2014  . Blood glucose elevated 08/31/2009    Loralyn Freshwater PT, DPT   2017/04/21, 1:14 PM  [place] [facility] PHYSICAL AND SPORTS MEDICINE [address], [place], [address] Phone: [phone]   Fax:  [phone]  Name: SHAKIRA LOS MRN: [id number] Date of Birth: [birth year]

## 2017-03-30 ENCOUNTER — Ambulatory Visit
Admission: RE | Admit: 2017-03-30 | Discharge: 2017-03-30 | Disposition: A | Discharge status: Home / Self Care | Payer: Medicare Other | Source: Ambulatory Visit | Attending: Family Medicine | Admitting: Family Medicine

## 2017-03-30 DIAGNOSIS — Z1231 Encounter for screening mammogram for malignant neoplasm of breast: Secondary | ICD-10-CM | POA: Diagnosis present

## 2017-03-30 DIAGNOSIS — Z1239 Encounter for other screening for malignant neoplasm of breast: Secondary | ICD-10-CM

## 2017-04-02 ENCOUNTER — Ambulatory Visit: Payer: Medicare Other | Attending: Family Medicine

## 2017-04-02 DIAGNOSIS — R262 Difficulty in walking, not elsewhere classified: Secondary | ICD-10-CM | POA: Insufficient documentation

## 2017-04-02 DIAGNOSIS — M25551 Pain in right hip: Secondary | ICD-10-CM | POA: Diagnosis not present

## 2017-04-02 NOTE — Patient Instructions (Addendum)
(  Home) Hip: Internal Rotation - Sitting    Opposite side toward anchor, right foot in handle. Pull foot out. May hold chair to keep body still.  No resistance.   Repeat ___10_ times per set. Do __3__ sets per session.   Copyright  VHI. All rights reserved.      Adduction: Hip - Knees Together (Sitting)   Sit with a folded pillow between knees. Squeeze your rear end muscles together. Push knees together. Hold for _5__ seconds. Repeat _10__ times. Do __3_ times a day.  Copyright  VHI. All rights reserved.

## 2017-04-02 NOTE — Therapy (Signed)
[place] [facility] [address], [place], [address] Phone: [phone]   Fax:  [phone]  Physical Therapy Treatment  Patient Details  Name: Monique Baker MRN: [id number] Date of Birth: 07-07-1950 Referring Provider: Alba Cory, MD   Encounter Date: 04/02/2017  PT End of Session - 04/02/17 0816    Visit Number  2    Number of Visits  13    Date for PT Re-Evaluation  05/10/17    Authorization Type  2    Authorization Time Period  of 10 G-code    PT Start Time  0817    PT Stop Time  0908    PT Time Calculation (min)  51 min    Activity Tolerance  Patient tolerated treatment well    Behavior During Therapy  [facility] for tasks assessed/performed       Past Medical History:  Diagnosis Date  . Allergy   . Depression   . GERD (gastroesophageal reflux disease)   . Hyperlipidemia   . Hypertension   . Internal hemorrhoids   . Menopausal disorder   . Osteoarthritis    hands  . Prediabetes   . Wears dentures    partial upper    Past Surgical History:  Procedure Laterality Date  . ABDOMINAL HYSTERECTOMY    . CESAREAN SECTION    . COLONOSCOPY WITH PROPOFOL N/A 03/15/2016   Procedure: COLONOSCOPY WITH PROPOFOL;  Surgeon: Kieth Brightly, MD;  Location: [facility] ENDOSCOPY;  Service: Endoscopy;  Laterality: N/A;  . HALLUX VALGUS LAPIDUS Left 06/16/2015   Procedure: HALLUX VALGUS LAPIDUS;  Surgeon: Gwyneth Revels, DPM;  Location: [facility];  Service: Podiatry;  Laterality: Left;  WITH POPLITEAL  . HAMMER TOE SURGERY Left 06/16/2015   Procedure: HAMMER TOE CORRECTION SECOND TOE LEFT;  Surgeon: Gwyneth Revels, DPM;  Location: [facility];  Service: Podiatry;  Laterality: Left;  prediabetic - on oral meds  . MYOMECTOMY    . trigger finger Right 12/15/2014  . Vulva cyst  11/2006    There were no vitals filed for this visit.  Subjective Assessment - 04/02/17 0818    Subjective  Had a long day  yesterday filled with church activities, visiting someone at a nursing home and taking care of grandchildren. Was hurting a little bit this morning when she got up. Gets better as the day progresses. Yesterday morning pt had no problems.  3/10 currently.     Pertinent History  R hip osteoarthritis. A couple of years ago, pt would have soreness in her R posterior hip. Had x-rays and was told that she has a hairline fracture which is healing itself. Pain stopped hurting afterwards. Pain however started from her R posterior hip to anterior hip at the joint area. Went to another doctor and was told that she has arthritis. Sometimes feels pain R medial thigh but not past her knee. Feels like her R leg would give way at times but does not fall. Her doctor does not think that she needs surgery for her R hip.  Did a lot of fixing up around her house yesterday and was on her hip a lot which bothered her hip.   Denies back pain.  Denies unexplained changes in weight.  Pain does not wake her up at night.   Pt assumes that her R hip hairline fracture is healed.   Pt husband is a Education officer, environmental and feels like she is in the "lime light." Wants to be  able to walk better so people do not have to ask her if she is ok as much.     Patient Stated Goals  Walk straight without limping.     Currently in Pain?  Yes    Pain Score  3     Pain Onset  More than a month ago                              PT Education - 04/02/17 0836    Education provided  Yes    Education Details  ther-ex, HEP    Person(s) Educated  Patient    Methods  Explanation;Demonstration;Tactile cues;Verbal cues;Handout    Comprehension  Returned demonstration;Verbalized understanding       Objectives   Ther-ex L S/L R hip abduction 10x2  L S/L R reverse clamshells 10x3  Supine muscle energy technique to promote R hip IR with PT 3x5 with 5 second holds  Seated R hip IR 10x3.   standing R hip extension resisting yellow band  10x3.     Side stepping 32 ft each direction 1x  Forward wedding march 32 ft x 3  Standing R LE leg press resisting blue band 10x3 with bilateral UE assist  Seated hip adductor/glute max squeeze with ball between knees 10x5 seconds for 2 sets to decrease piriformis activation. Decreased R hip pinching sensation in sitting.   Improved exercise technique, movement at target joints, use of target muscles after mod verbal, visual, tactile cues.   Decreased R hip pain following exercises to promote R hip IR ROM and decrease piriformis muscle activation. Improved R hip IR ROM observed in 90/90 position after muscle energy technique.          PT Long Term Goals - 03/28/17 1145      PT LONG TERM GOAL #1   Title  Pt will have a decrease in R hip pain to 2/10 or less at worst to promote ability to ambulate, negotiate stairs, pick up items from the floor.     Baseline  4/10 R hip pain at worst for the past 2 months (03/28/2017)    Time  6    Period  Weeks    Status  New    Target Date  05/10/17      PT LONG TERM GOAL #2   Title  Pt will improve R hip abduction and extension strength by at least 1/2 MMT grade to promote ability to ambulate with less R hip pain.    Time  6    Period  Weeks    Status  New    Target Date  05/10/17      PT LONG TERM GOAL #3   Title  Pt will improve her LEFS score by at least 9 points as a demonstration of improved function.     Baseline  37/80 (03/28/2017)    Time  6    Period  Weeks    Status  New    Target Date  05/10/17      PT LONG TERM GOAL #4   Title  Pt will report being able to tolerate walking at least 10 minutes to promote mobility.     Baseline  Pt reports walking 5 minutes bothers her R hip 03/28/2017.     Time  6    Period  Weeks    Status  New    Target Date  05/10/17  Plan - 04/02/17 0814    Clinical Impression Statement  Decreased R hip pain following exercises to promote R hip IR ROM and decrease piriformis  muscle activation. Improved R hip IR ROM observed in 90/90 position after muscle energy technique.     History and Personal Factors relevant to plan of care:  Chronicity of condition    Clinical Presentation  Stable    Clinical Presentation due to:  Decreased R hip pain after exercises that promote IR    Clinical Decision Making  Low    Rehab Potential  Fair    Clinical Impairments Affecting Rehab Potential  Chronicity of condition    PT Frequency  2x / week    PT Duration  6 weeks    PT Treatment/Interventions  Therapeutic activities;Therapeutic exercise;Neuromuscular re-education;Aquatic Therapy;Electrical Stimulation;Iontophoresis 4mg /ml Dexamethasone;Ultrasound;Gait training;Stair training;Patient/family education;Manual techniques;Dry needling;Traction traction if appropriate    PT Next Visit Plan  Hip ROM, glute strengthening    Consulted and Agree with Plan of Care  Patient       Patient will benefit from skilled therapeutic intervention in order to improve the following deficits and impairments:  Pain, Improper body mechanics, Difficulty walking, Decreased strength  Visit Diagnosis: Pain in right hip  Difficulty in walking, not elsewhere classified     Problem List Patient Active Problem List   Diagnosis Date Noted  . Osteopenia of hip 09/04/2016  . History of bunionectomy of left great toe 07/15/2015  . H/O hammer toe correction 03/15/2015  . Arthritis of lumbar spine 01/19/2015  . Benign essential HTN 11/05/2014  . Carpal tunnel syndrome 11/05/2014  . Osteoarthritis 11/05/2014  . Dermatitis, eczematoid 11/05/2014  . Depression, major, recurrent, mild (HCC) 11/05/2014  . Dyslipidemia 11/05/2014  . Gastro-esophageal reflux disease without esophagitis 11/05/2014  . Extreme obesity 11/05/2014  . Dysmetabolic syndrome 11/05/2014  . Menopausal and perimenopausal disorder 11/05/2014  . Perennial allergic rhinitis with seasonal variation 11/05/2014  . Seborrhea capitis  11/05/2014  . Acquired trigger finger 11/05/2014  . Urinary incontinence in female 11/05/2014  . Vitamin D deficiency 11/05/2014  . Blood glucose elevated 08/31/2009    Loralyn FreshwaterMiguel Alter Moss PT, DPT   04/02/2017, 6:53 PM  [place] [facility] [address] [place], [place], [address] Phone: [phone]   Fax:  [phone]  Name: Monique Baker MRN: [id number] Date of Birth: 08/16/1950

## 2017-04-04 ENCOUNTER — Ambulatory Visit: Payer: Medicare Other

## 2017-04-04 DIAGNOSIS — R262 Difficulty in walking, not elsewhere classified: Secondary | ICD-10-CM

## 2017-04-04 DIAGNOSIS — M25551 Pain in right hip: Secondary | ICD-10-CM

## 2017-04-04 NOTE — Therapy (Signed)
[place] [facility] [address], [place], [address] Phone: [phone]   Fax:  [phone]  Physical Therapy Treatment  Patient Details  Name: Monique Baker MRN: [id number] Date of Birth: February 11, 1951 Referring Provider: Alba Cory, MD   Encounter Date: 04/04/2017  PT End of Session - 04/04/17 1000    Visit Number  3    Number of Visits  13    Date for PT Re-Evaluation  05/10/17    Authorization Type  3    Authorization Time Period  of 10 G-code    PT Start Time  1000 pt arrived late    PT Stop Time  1046    PT Time Calculation (min)  46 min    Activity Tolerance  Patient tolerated treatment well    Behavior During Therapy  [facility] for tasks assessed/performed       Past Medical History:  Diagnosis Date  . Allergy   . Depression   . GERD (gastroesophageal reflux disease)   . Hyperlipidemia   . Hypertension   . Internal hemorrhoids   . Menopausal disorder   . Osteoarthritis    hands  . Prediabetes   . Wears dentures    partial upper    Past Surgical History:  Procedure Laterality Date  . ABDOMINAL HYSTERECTOMY    . CESAREAN SECTION    . COLONOSCOPY WITH PROPOFOL N/A 03/15/2016   Procedure: COLONOSCOPY WITH PROPOFOL;  Surgeon: Kieth Brightly, MD;  Location: [facility];  Service: Endoscopy;  Laterality: N/A;  . HALLUX VALGUS LAPIDUS Left 06/16/2015   Procedure: HALLUX VALGUS LAPIDUS;  Surgeon: Gwyneth Revels, DPM;  Location: [facility];  Service: Podiatry;  Laterality: Left;  WITH POPLITEAL  . HAMMER TOE SURGERY Left 06/16/2015   Procedure: HAMMER TOE CORRECTION SECOND TOE LEFT;  Surgeon: Gwyneth Revels, DPM;  Location: [facility];  Service: Podiatry;  Laterality: Left;  prediabetic - on oral meds  . MYOMECTOMY    . trigger finger Right 12/15/2014  . Vulva cyst  11/2006    There were no vitals filed for this visit.  Subjective Assessment - 04/04/17 1001    Subjective  Pt  states that she has to to cancel next Wed. R hip is pretty good. No pain currently. Was ok after last session. Did her HEP.     Pertinent History  R hip osteoarthritis. A couple of years ago, pt would have soreness in her R posterior hip. Had x-rays and was told that she has a hairline fracture which is healing itself. Pain stopped hurting afterwards. Pain however started from her R posterior hip to anterior hip at the joint area. Went to another doctor and was told that she has arthritis. Sometimes feels pain R medial thigh but not past her knee. Feels like her R leg would give way at times but does not fall. Her doctor does not think that she needs surgery for her R hip.  Did a lot of fixing up around her house yesterday and was on her hip a lot which bothered her hip.   Denies back pain.  Denies unexplained changes in weight.  Pain does not wake her up at night.   Pt assumes that her R hip hairline fracture is healed.   Pt husband is a Education officer, environmental and feels like she is in the "lime light." Wants to be able to walk better so people do not have to ask her if she is ok as  much.     Patient Stated Goals  Walk straight without limping.     Currently in Pain?  No/denies    Pain Score  0-No pain    Pain Onset  More than a month ago         [facility] Assessment - 04/04/17 1028      PROM   Overall PROM Comments  hip in 90/90: 7 degrees R hip                          PT Education - 04/04/17 1019    Education provided  Yes    Education Details  ther-ex    Starwood HotelsPerson(s) Educated  Patient    Methods  Explanation;Demonstration;Tactile cues;Verbal cues    Comprehension  Returned demonstration;Verbalized understanding         Objectives  Manual therapy   L S/L: STM R piriformis to decrease tension and improve R hip IR ROM   Ther-ex  L S/L R reverse clamshells 10x3  L S/L R hip abduction 10x2  Supine muscle energy technique to promote R hip IR with PT 3x5 with 5 second holds     7 degrees supine R hip IR PROM in 90/90 position  Seated R hip IR 10x3.   Forward wedding march 32 ft x 4 to promote glute strengthening  Standing R LE leg press resisting blue band 10x3 with bilateral UE assist  Seated hip adductor/glute max squeeze with ball between knees 10x5 seconds for 2 sets to decrease piriformis activation.    Improved exercise technique, movement at target joints, use of target muscles after min to mod verbal, visual, tactile cues.    Improving R hip IR compared to initial evaluation. Decreasing R hip pain starting level. Tense R piriformis muscle, therefore STM to that muscle performed to decrease tension and improve IR. No complain of R hip pain throughout session.        PT Long Term Goals - 03/28/17 1145      PT LONG TERM GOAL #1   Title  Pt will have a decrease in R hip pain to 2/10 or less at worst to promote ability to ambulate, negotiate stairs, pick up items from the floor.     Baseline  4/10 R hip pain at worst for the past 2 months (03/28/2017)    Time  6    Period  Weeks    Status  New    Target Date  05/10/17      PT LONG TERM GOAL #2   Title  Pt will improve R hip abduction and extension strength by at least 1/2 MMT grade to promote ability to ambulate with less R hip pain.    Time  6    Period  Weeks    Status  New    Target Date  05/10/17      PT LONG TERM GOAL #3   Title  Pt will improve her LEFS score by at least 9 points as a demonstration of improved function.     Baseline  37/80 (03/28/2017)    Time  6    Period  Weeks    Status  New    Target Date  05/10/17      PT LONG TERM GOAL #4   Title  Pt will report being able to tolerate walking at least 10 minutes to promote mobility.     Baseline  Pt reports walking 5 minutes bothers  her R hip 03/28/2017.     Time  6    Period  Weeks    Status  New    Target Date  05/10/17            Plan - 04/04/17 1019    Clinical Impression Statement  Improving R hip IR  compared to initial evaluation. Decreasing R hip pain starting level. Tense R piriformis muscle, therefore STM to that muscle performed to decrease tension and improve IR. No complain of R hip pain throughout session.     History and Personal Factors relevant to plan of care:  Chronicity of condition    Clinical Presentation  Stable    Clinical Presentation due to:  No starting R hip pain    Clinical Decision Making  Low    Rehab Potential  Fair    Clinical Impairments Affecting Rehab Potential  Chronicity of condition    PT Frequency  2x / week    PT Duration  6 weeks    PT Treatment/Interventions  Therapeutic activities;Therapeutic exercise;Neuromuscular re-education;Aquatic Therapy;Electrical Stimulation;Iontophoresis 4mg /ml Dexamethasone;Ultrasound;Gait training;Stair training;Patient/family education;Manual techniques;Dry needling;Traction traction if appropriate    PT Next Visit Plan  Hip ROM, glute strengthening    Consulted and Agree with Plan of Care  Patient       Patient will benefit from skilled therapeutic intervention in order to improve the following deficits and impairments:  Pain, Improper body mechanics, Difficulty walking, Decreased strength  Visit Diagnosis: Pain in right hip  Difficulty in walking, not elsewhere classified     Problem List Patient Active Problem List   Diagnosis Date Noted  . Osteopenia of hip 09/04/2016  . History of bunionectomy of left great toe 07/15/2015  . H/O hammer toe correction 03/15/2015  . Arthritis of lumbar spine 01/19/2015  . Benign essential HTN 11/05/2014  . Carpal tunnel syndrome 11/05/2014  . Osteoarthritis 11/05/2014  . Dermatitis, eczematoid 11/05/2014  . Depression, major, recurrent, mild (HCC) 11/05/2014  . Dyslipidemia 11/05/2014  . Gastro-esophageal reflux disease without esophagitis 11/05/2014  . Extreme obesity 11/05/2014  . Dysmetabolic syndrome 11/05/2014  . Menopausal and perimenopausal disorder 11/05/2014   . Perennial allergic rhinitis with seasonal variation 11/05/2014  . Seborrhea capitis 11/05/2014  . Acquired trigger finger 11/05/2014  . Urinary incontinence in female 11/05/2014  . Vitamin D deficiency 11/05/2014  . Blood glucose elevated 08/31/2009    Loralyn FreshwaterMiguel Zamir Staples PT, DPT   04/04/2017, 10:56 AM  [place] [facility] [address] [place], [place], [address] Phone: [phone]   Fax:  [phone]  Name: Monique Baker MRN: [id number] Date of Birth: [birth year]

## 2017-04-11 ENCOUNTER — Ambulatory Visit: Payer: Medicare Other

## 2017-04-12 ENCOUNTER — Ambulatory Visit: Payer: Medicare Other

## 2017-04-13 ENCOUNTER — Ambulatory Visit: Payer: Medicare Other

## 2017-04-13 DIAGNOSIS — M25551 Pain in right hip: Secondary | ICD-10-CM | POA: Diagnosis not present

## 2017-04-13 DIAGNOSIS — R262 Difficulty in walking, not elsewhere classified: Secondary | ICD-10-CM

## 2017-04-13 NOTE — Therapy (Signed)
[facility] PH[facility] AND [facility] MEDICINE [address] [place], [place], [address] Phone: [phone]   Fax:  [phone]  Physical Therapy Treatment  Patient Details  Name: Monique Baker MRN: [id number] Date of Birth: [birth year] Referring Provider: Alba CoryKrichna Sowles, MD   Encounter Date: 04/13/2017  PT End of Session - 04/13/17 0857    Visit Number  4    Number of Visits  13    Date for PT Re-Evaluation  05/10/17    Authorization Type  4    Authorization Time Period  of 10 G-code    PT Start Time  0857    PT Stop Time  0945    PT Time Calculation (min)  48 min    Activity Tolerance  Patient tolerated treatment well    Behavior During Therapy  [facility] for tasks assessed/performed       Past Medical History:  Diagnosis Date  . Allergy   . Depression   . GERD (gastroesophageal reflux disease)   . Hyperlipidemia   . Hypertension   . Internal hemorrhoids   . Menopausal disorder   . Osteoarthritis    hands  . Prediabetes   . Wears dentures    partial upper    Past Surgical History:  Procedure Laterality Date  . ABDOMINAL HYSTERECTOMY    . CESAREAN SECTION    . COLONOSCOPY WITH PROPOFOL N/A 03/15/2016   Procedure: COLONOSCOPY WITH PROPOFOL;  Surgeon: Kieth BrightlySeeplaputhur G Sankar, MD;  Location: [facility] ENDOSCOPY;  Service: Endoscopy;  Laterality: N/A;  . HALLUX VALGUS LAPIDUS Left 06/16/2015   Procedure: HALLUX VALGUS LAPIDUS;  Surgeon: Gwyneth RevelsJustin Fowler, DPM;  [facility];  Service: Podiatry;  Laterality: Left;  WITH POPLITEAL  . HAMMER TOE SURGERY Left 06/16/2015   Procedure: HAMMER TOE CORRECTION SECOND TOE LEFT;  Surgeon: Gwyneth RevelsJustin Fowler, DPM;  [facility];  Service: Podiatry;  Laterality: Left;  prediabetic - on oral meds  . MYOMECTOMY    . trigger finger Right 12/15/2014  . Vulva cyst  11/2006    There were no vitals filed for this visit.  Subjective Assessment - 04/13/17 0858    Subjective  R hip is pretty  good. Has been doing her HEP. No pain currently. 2/10 R hip pain at most for the past 7 days. [place] [facility] [place] and back Saturday.     Pertinent History  R hip osteoarthritis. A couple of years ago, pt would have soreness in her R posterior hip. Had x-rays and was told that she has a hairline fracture which is healing itself. Pain stopped hurting afterwards. Pain however started from her R posterior hip to anterior hip at the joint area. Went to another doctor and was told that she has arthritis. Sometimes feels pain R medial thigh but not past her knee. Feels like her R leg would give way at times but does not fall. Her doctor does not think that she needs surgery for her R hip.  Did a lot of fixing up around her house yesterday and was on her hip a lot which bothered her hip.   Denies back pain.  Denies unexplained changes in weight.  Pain does not wake her up at night.   Pt assumes that her R hip hairline fracture is healed.   Pt husband is a Education officer, environmentalpastor and feels like she is in the "lime light." Wants to be able to walk better so people do not have to ask her  if she is ok as much.     Patient Stated Goals  Walk straight without limping.     Currently in Pain?  No/denies    Pain Score  0-No pain    Pain Onset  More than a month ago                              PT Education - 04/13/17 0912    Education provided  Yes    Education Details  ther-ex    Starwood HotelsPerson(s) Educated  Patient    Methods  Explanation;Demonstration;Tactile cues;Verbal cues    Comprehension  Verbalized understanding;Returned demonstration         Objectives  [place] [facility] [place] and back Saturday.   Manual therapy   L S/L STM R piriformis to decrease tension and improve R hip IR ROM Supine long axis distraction R hip in hook lying position. Felt good for the hip per pt    Ther-ex  L S/L R reverse clamshells 10x3  L S/L R hip abduction 10x2  Supine  muscle energy technique to promote R hip IR with PT 3x5 with 5 second holds                    Standing R hip extension 10x2 with bilateral UE assist   Standing hip machine height 2: R hip extension plate 40 for 5x2 to promote glute max muscle use  Standing R LE leg press resisting blue band 10x3 with bilateral UE assist   Improved exercise technique, movement at target joints, use of target muscles after mod verbal, visual, tactile cues.   Pt seems to be making good progress with decreasing R hip pain based on subjective reports. Still demonstrates limited R hip IR and continued working on STM to R piriformis muscle and long axis distraction to R hip to help with ROM followed by glute strengthening to help decrease overuse of R piriformis muscle. Pt tolerated session well without aggravation of symptoms.      PT Long Term Goals - 03/28/17 1145      PT LONG TERM GOAL #1   Title  Pt will have a decrease in R hip pain to 2/10 or less at worst to promote ability to ambulate, negotiate stairs, pick up items from the floor.     Baseline  4/10 R hip pain at worst for the past 2 months (03/28/2017)    Time  6    Period  Weeks    Status  New    Target Date  05/10/17      PT LONG TERM GOAL #2   Title  Pt will improve R hip abduction and extension strength by at least 1/2 MMT grade to promote ability to ambulate with less R hip pain.    Time  6    Period  Weeks    Status  New    Target Date  05/10/17      PT LONG TERM GOAL #3   Title  Pt will improve her LEFS score by at least 9 points as a demonstration of improved function.     Baseline  37/80 (03/28/2017)    Time  6    Period  Weeks    Status  New    Target Date  05/10/17      PT LONG TERM GOAL #4   Title  Pt will report being able to  tolerate walking at least 10 minutes to promote mobility.     Baseline  Pt reports walking 5 minutes bothers her R hip 03/28/2017.     Time  6    Period  Weeks    Status  New    Target Date   05/10/17            Plan - 04/13/17 0912    Clinical Impression Statement  Pt seems to be making good progress with decreasing R hip pain based on subjective reports. Still demonstrates limited R hip IR and continued working on STM to R piriformis muscle and long axis distraction to R hip to help with ROM followed by glute strengthening to help decrease overuse of R piriformis muscle. Pt tolerated session well without aggravation of symptoms.     History and Personal Factors relevant to plan of care:  Chronicity of condition    Clinical Presentation  Stable    Clinical Presentation due to:  decreased pain level at worst     Clinical Decision Making  Low    Rehab Potential  Fair    Clinical Impairments Affecting Rehab Potential  Chronicity of condition    PT Frequency  2x / week    PT Duration  6 weeks    PT Treatment/Interventions  Therapeutic activities;Therapeutic exercise;Neuromuscular re-education;Aquatic Therapy;Electrical Stimulation;Iontophoresis 4mg /ml Dexamethasone;Ultrasound;Gait training;Stair training;Patient/family education;Manual techniques;Dry needling;Traction traction if appropriate    PT Next Visit Plan  Hip ROM, glute strengthening    Consulted and Agree with Plan of Care  Patient       Patient will benefit from skilled therapeutic intervention in order to improve the following deficits and impairments:  Pain, Improper body mechanics, Difficulty walking, Decreased strength  Visit Diagnosis: Pain in right hip  Difficulty in walking, not elsewhere classified     Problem List Patient Active Problem List   Diagnosis Date Noted  . Osteopenia of hip 09/04/2016  . History of bunionectomy of left great toe 07/15/2015  . H/O hammer toe correction 03/15/2015  . Arthritis of lumbar spine 01/19/2015  . Benign essential HTN 11/05/2014  . Carpal tunnel syndrome 11/05/2014  . Osteoarthritis 11/05/2014  . Dermatitis, eczematoid 11/05/2014  . Depression, major,  recurrent, mild (HCC) 11/05/2014  . Dyslipidemia 11/05/2014  . Gastro-esophageal reflux disease without esophagitis 11/05/2014  . Extreme obesity 11/05/2014  . Dysmetabolic syndrome 11/05/2014  . Menopausal and perimenopausal disorder 11/05/2014  . Perennial allergic rhinitis with seasonal variation 11/05/2014  . Seborrhea capitis 11/05/2014  . Acquired trigger finger 11/05/2014  . Urinary incontinence in female 11/05/2014  . Vitamin D deficiency 11/05/2014  . Blood glucose elevated 08/31/2009   Monique Baker PT, DPT   04/13/2017, 9:55 AM  [facility] PHYSICAL AND SPORTS MEDICINE [address], [place], [address] Phone: [phone]   Fax:  [phone]  Name: Monique Baker MRN: [id number] Date of Birth: [birth year]

## 2017-04-26 ENCOUNTER — Other Ambulatory Visit: Payer: Self-pay | Admitting: Family Medicine

## 2017-04-26 DIAGNOSIS — B351 Tinea unguium: Secondary | ICD-10-CM

## 2017-04-26 NOTE — Telephone Encounter (Addendum)
Refill request for general medication: Lamsil to [facility].   Last office visit: 03/19/2017

## 2017-05-03 ENCOUNTER — Ambulatory Visit: Payer: Medicare Other | Attending: Family Medicine

## 2017-05-03 DIAGNOSIS — M25551 Pain in right hip: Secondary | ICD-10-CM | POA: Diagnosis not present

## 2017-05-03 DIAGNOSIS — R262 Difficulty in walking, not elsewhere classified: Secondary | ICD-10-CM | POA: Insufficient documentation

## 2017-05-03 NOTE — Therapy (Signed)
[facility]ICINE [address], [place], [address] Phone: [phone]   Fax:  [phone]  Physical Therapy Treatment  Patient Details  Name: Monique Baker MRN: [id number] Date of Birth: 08-03-1950 Referring Provider: Alba Cory, MD   Encounter Date: 05/03/2017  PT End of Session - 05/03/17 1305    Visit Number  5    Number of Visits  13    Date for PT Re-Evaluation  05/10/17    PT Start Time  1305    PT Stop Time  1359    PT Time Calculation (min)  54 min    Activity Tolerance  Patient tolerated treatment well    Behavior During Therapy  [facility] for tasks assessed/performed       Past Medical History:  Diagnosis Date  . Allergy   . Depression   . GERD (gastroesophageal reflux disease)   . Hyperlipidemia   . Hypertension   . Internal hemorrhoids   . Menopausal disorder   . Osteoarthritis    hands  . Prediabetes   . Wears dentures    partial upper    Past Surgical History:  Procedure Laterality Date  . ABDOMINAL HYSTERECTOMY    . CESAREAN SECTION    . COLONOSCOPY WITH PROPOFOL N/A 03/15/2016   Procedure: COLONOSCOPY WITH PROPOFOL;  Surgeon: Kieth Brightly, MD;  Location: [facility];  Service: Endoscopy;  Laterality: N/A;  . HALLUX VALGUS LAPIDUS Left 06/16/2015   Procedure: HALLUX VALGUS LAPIDUS;  Surgeon: Gwyneth Revels, DPM;  [facility];  Service: Podiatry;  Laterality: Left;  WITH POPLITEAL  . HAMMER TOE SURGERY Left 06/16/2015   Procedure: HAMMER TOE CORRECTION SECOND TOE LEFT;  Surgeon: Gwyneth Revels, DPM;  [facility];  Service: Podiatry;  Laterality: Left;  prediabetic - on oral meds  . MYOMECTOMY    . trigger finger Right 12/15/2014  . Vulva cyst  11/2006    There were no vitals filed for this visit.  Subjective Assessment - 05/03/17 1306    Subjective  R hip is doing fairly well. Only had one episode during the holidays when it gave her a  fit. Has been trying to do the exercises. Seems to be a little better compared to what it was when she initially started PT.  No R hip pain currently.  4/10 R hip pain at most for the past 7 days.     Pertinent History  R hip osteoarthritis. A couple of years ago, pt would have soreness in her R posterior hip. Had x-rays and was told that she has a hairline fracture which is healing itself. Pain stopped hurting afterwards. Pain however started from her R posterior hip to anterior hip at the joint area. Went to another doctor and was told that she has arthritis. Sometimes feels pain R medial thigh but not past her knee. Feels like her R leg would give way at times but does not fall. Her doctor does not think that she needs surgery for her R hip.  Did a lot of fixing up around her house yesterday and was on her hip a lot which bothered her hip.   Denies back pain.  Denies unexplained changes in weight.  Pain does not wake her up at night.   Pt assumes that her R hip hairline fracture is healed.   Pt husband is a Education officer, environmental and feels like she is in the "lime light." Wants to be able to walk  better so people do not have to ask her if she is ok as much.     Patient Stated Goals  Walk straight without limping.     Currently in Pain?  No/denies    Pain Score  0-No pain    Pain Onset  More than a month ago                              PT Education - 05/03/17 1410    Education provided  Yes    Education Details  ther-ex, HEP    Person(s) Educated  Patient    Methods  Explanation;Demonstration;Tactile cues;Verbal cues;Handout    Comprehension  Returned demonstration;Verbalized understanding           Objectives  Pt states that her R thigh feels bigger than her L. Has not seen her MD yet.   No pain with calf squeeze. No increased redness, discoloration or abnormal warmth with R LE compared to L . Pt denies hx of blood clots. Pt was recommended that if her leg gets warm, red or  painful, to go to the ER. Pt verbalized understanding.   Circumference: gastroc area (13 cm below knee cap): 38 cm R, 39 cm L     Thigh area (18 cm above knee cap): 61 cm R, 59 cm L  Pt demonstrates hx of R hip osteoarthritis.   Manual therapy  L S/L STM R piriformis to decrease tension and improve R hip IR ROM  Supine long axis distraction R hip in hook lying position and then R leg straight.     Ther-ex   Standing hip machine height 2: R hip extension plate 40 for 5x2 to promote glute max muscle use     L hip extension plate 40 for 16X10x to promote glute max muscle use  Forward wedding march holding onto 5 lbs each hand 32 ft x 4  Leg press plate 55 for 5x  Standing mini squats 5x 2 to promote glute max muscle use, no UE assist   Standing R hip extension 10x2 with 5 second holds  with bilateral UE assist     Improved exercise technique, movement at target joints, use of target muscles after min to mod verbal, visual, tactile cues.    Pt states no pain or discomfort. Felt a good workout. Continued working on glute med and max strengthening and decreasing piriformis muscle tension and working on decreasing pressure to R hip joint. Pt tolerated session well without aggravation of symptoms.         PT Long Term Goals - 03/28/17 1145      PT LONG TERM GOAL #1   Title  Pt will have a decrease in R hip pain to 2/10 or less at worst to promote ability to ambulate, negotiate stairs, pick up items from the floor.     Baseline  4/10 R hip pain at worst for the past 2 months (03/28/2017)    Time  6    Period  Weeks    Status  New    Target Date  05/10/17      PT LONG TERM GOAL #2   Title  Pt will improve R hip abduction and extension strength by at least 1/2 MMT grade to promote ability to ambulate with less R hip pain.    Time  6    Period  Weeks    Status  New  Target Date  05/10/17      PT LONG TERM GOAL #3   Title  Pt will improve her LEFS score by at  least 9 points as a demonstration of improved function.     Baseline  37/80 (03/28/2017)    Time  6    Period  Weeks    Status  New    Target Date  05/10/17      PT LONG TERM GOAL #4   Title  Pt will report being able to tolerate walking at least 10 minutes to promote mobility.     Baseline  Pt reports walking 5 minutes bothers her R hip 03/28/2017.     Time  6    Period  Weeks    Status  New    Target Date  05/10/17            Plan - 05/03/17 1410    Clinical Impression Statement  Pt states no pain or discomfort. Felt a good workout. Continued working on glute med and max strengthening and decreasing piriformis muscle tension and working on decreasing pressure to R hip joint. Pt tolerated session well without aggravation of symptoms.     History and Personal Factors relevant to plan of care:  Chronicity of condition    Clinical Presentation  Stable    Clinical Presentation due to:  Pt tolerated session well without aggravation of symptoms.     Clinical Decision Making  Low    Rehab Potential  Fair    Clinical Impairments Affecting Rehab Potential  Chronicity of condition    PT Frequency  2x / week    PT Duration  6 weeks    PT Treatment/Interventions  Therapeutic activities;Therapeutic exercise;Neuromuscular re-education;Aquatic Therapy;Electrical Stimulation;Iontophoresis 4mg /ml Dexamethasone;Ultrasound;Gait training;Stair training;Patient/family education;Manual techniques;Dry needling;Traction traction if appropriate    PT Next Visit Plan  Hip ROM, glute strengthening    Consulted and Agree with Plan of Care  Patient       Patient will benefit from skilled therapeutic intervention in order to improve the following deficits and impairments:  Pain, Improper body mechanics, Difficulty walking, Decreased strength  Visit Diagnosis: Pain in right hip  Difficulty in walking, not elsewhere classified     Problem List Patient Active Problem List   Diagnosis Date Noted  .  Osteopenia of hip 09/04/2016  . History of bunionectomy of left great toe 07/15/2015  . H/O hammer toe correction 03/15/2015  . Arthritis of lumbar spine 01/19/2015  . Benign essential HTN 11/05/2014  . Carpal tunnel syndrome 11/05/2014  . Osteoarthritis 11/05/2014  . Dermatitis, eczematoid 11/05/2014  . Depression, major, recurrent, mild (HCC) 11/05/2014  . Dyslipidemia 11/05/2014  . Gastro-esophageal reflux disease without esophagitis 11/05/2014  . Extreme obesity 11/05/2014  . Dysmetabolic syndrome 11/05/2014  . Menopausal and perimenopausal disorder 11/05/2014  . Perennial allergic rhinitis with seasonal variation 11/05/2014  . Seborrhea capitis 11/05/2014  . Acquired trigger finger 11/05/2014  . Urinary incontinence in female 11/05/2014  . Vitamin D deficiency 11/05/2014  . Blood glucose elevated 08/31/2009    Loralyn Freshwater PT, DPT   05/03/2017, 2:20 PM  [facility] PHYSICAL AND SPORTS MEDICINE [address], [place], [address] Phone: [phone]   Fax:  [phone]  Name: AMARISA WILINSKI MRN: [id number] Date of Birth: 12-18-1950

## 2017-05-03 NOTE — Patient Instructions (Addendum)
  Standing in front of something sturdy to hold on to for safety and with a chair behind you    Perform a mini squat, pushing your rear end back    Hold for 5 seconds.    Repeat 5 times.   Perform 2 sets daily.     You can also sit on a heating pad for 15 minutes, 3-4 times a day.     Pt was told she can take a break from the seated hip ER and seated hip adduction HEP.     Pt demonstrated and verbalized understanding.

## 2017-05-07 ENCOUNTER — Ambulatory Visit: Payer: Medicare Other

## 2017-05-08 ENCOUNTER — Ambulatory Visit: Payer: Medicare Other | Admitting: Physical Therapy

## 2017-05-08 ENCOUNTER — Encounter: Payer: Self-pay | Admitting: Physical Therapy

## 2017-05-08 ENCOUNTER — Other Ambulatory Visit: Payer: Self-pay

## 2017-05-08 DIAGNOSIS — R262 Difficulty in walking, not elsewhere classified: Secondary | ICD-10-CM

## 2017-05-08 DIAGNOSIS — M25551 Pain in right hip: Secondary | ICD-10-CM

## 2017-05-08 NOTE — Therapy (Signed)
[place] [facility] [address] [place], [address] Phone: [phone]   Fax:  [phone]  Physical Therapy Treatment  Patient Details  Name: Monique Baker MRN: [id number] Date of Birth: 20-Aug-1950 Referring Provider: Alba Cory, MD   Encounter Date: 05/08/2017  PT End of Session - 05/08/17 1302    Visit Number  6    Number of Visits  13    Date for PT Re-Evaluation  05/10/17    PT Start Time  1302    PT Stop Time  1329    PT Time Calculation (min)  27 min    Activity Tolerance  Patient tolerated treatment well    Behavior During Therapy  [facility] for tasks assessed/performed       Past Medical History:  Diagnosis Date  . Allergy   . Depression   . GERD (gastroesophageal reflux disease)   . Hyperlipidemia   . Hypertension   . Internal hemorrhoids   . Menopausal disorder   . Osteoarthritis    hands  . Prediabetes   . Wears dentures    partial upper    Past Surgical History:  Procedure Laterality Date  . ABDOMINAL HYSTERECTOMY    . CESAREAN SECTION    . COLONOSCOPY WITH PROPOFOL N/A 03/15/2016   Procedure: COLONOSCOPY WITH PROPOFOL;  Surgeon: Kieth Brightly, MD;  Location: [facility];  Service: Endoscopy;  Laterality: N/A;  . HALLUX VALGUS LAPIDUS Left 06/16/2015   Procedure: HALLUX VALGUS LAPIDUS;  Surgeon: Gwyneth Revels, DPM;  Location: [facility];  Service: Podiatry;  Laterality: Left;  WITH POPLITEAL  . HAMMER TOE SURGERY Left 06/16/2015   Procedure: HAMMER TOE CORRECTION SECOND TOE LEFT;  Surgeon: Gwyneth Revels, DPM;  Location: [facility];  Service: Podiatry;  Laterality: Left;  prediabetic - on oral meds  . MYOMECTOMY    . trigger finger Right 12/15/2014  . Vulva cyst  11/2006    There were no vitals filed for this visit.  Subjective Assessment - 05/08/17 1304    Subjective  Pt reports she is feeling some pressure in her R hip, pt was busy yesterday which might be  why.  Pt has been completing her HEP with some relief, performing several times each day.      Pertinent History  R hip osteoarthritis. A couple of years ago, pt would have soreness in her R posterior hip. Had x-rays and was told that she has a hairline fracture which is healing itself. Pain stopped hurting afterwards. Pain however started from her R posterior hip to anterior hip at the joint area. Went to another doctor and was told that she has arthritis. Sometimes feels pain R medial thigh but not past her knee. Feels like her R leg would give way at times but does not fall. Her doctor does not think that she needs surgery for her R hip.  Did a lot of fixing up around her house yesterday and was on her hip a lot which bothered her hip.   Denies back pain.  Denies unexplained changes in weight.  Pain does not wake her up at night.   Pt assumes that her R hip hairline fracture is healed.   Pt husband is a Education officer, environmental and feels like she is in the "lime light." Wants to be able to walk better so people do not have to ask her if she is ok as much.     Patient Stated Goals  Walk straight  without limping.     Currently in Pain?  Yes    Pain Score  3     Pain Location  Hip    Pain Orientation  Right    Pain Descriptors / Indicators  Aching    Pain Onset  More than a month ago    Multiple Pain Sites  No        TREATMENT   Manual Therapy:  L S/L STM R piriformis to decrease tension and improve R hip IR ROM  Supine long axis distraction R hip in hook lying position and then R leg straight. 3x45 seconds   Therapeutic Exercise:  Standing hip machine height 2: R hip extension plate 82#40# [id number], 21#55# x10 to promote glute max muscle use  Standing hip machine height 2: L hip extension plate 30#55# for [id number] to promote glute max muscle use  Forward wedding march with RTB around knees x30 ft x4  Standing mini squats x10 to promote glute max muscle use, no UE assist?  Standing R hip extension x15 with 5 second holds with  bilateral UE assist                        PT Education - 05/08/17 1302    Education provided  Yes    Education Details  Exercise technique    Person(s) Educated  Patient    Methods  Explanation;Demonstration;Verbal cues    Comprehension  Verbalized understanding;Returned demonstration;Verbal cues required;Need further instruction          PT Long Term Goals - 03/28/17 1145      PT LONG TERM GOAL #1   Title  Pt will have a decrease in R hip pain to 2/10 or less at worst to promote ability to ambulate, negotiate stairs, pick up items from the floor.     Baseline  4/10 R hip pain at worst for the past 2 months (03/28/2017)    Time  6    Period  Weeks    Status  New    Target Date  05/10/17      PT LONG TERM GOAL #2   Title  Pt will improve R hip abduction and extension strength by at least 1/2 MMT grade to promote ability to ambulate with less R hip pain.    Time  6    Period  Weeks    Status  New    Target Date  05/10/17      PT LONG TERM GOAL #3   Title  Pt will improve her LEFS score by at least 9 points as a demonstration of improved function.     Baseline  37/80 (03/28/2017)    Time  6    Period  Weeks    Status  New    Target Date  05/10/17      PT LONG TERM GOAL #4   Title  Pt will report being able to tolerate walking at least 10 minutes to promote mobility.     Baseline  Pt reports walking 5 minutes bothers her R hip 03/28/2017.     Time  6    Period  Weeks    Status  New    Target Date  05/10/17            Plan - 05/08/17 1318    Clinical Impression Statement  Pt reports relief following manual techqniues with STM and distraction to RLE.  Progressed hip strengthening exercises which pt tolerated  well, reporting fatigue at the end of each set.  Pt will benefit from continued skilled PT interventions for improved strength and decreased pain R hip.     Rehab Potential  Fair    Clinical Impairments Affecting Rehab Potential  Chronicity  of condition    PT Frequency  2x / week    PT Duration  6 weeks    PT Treatment/Interventions  Therapeutic activities;Therapeutic exercise;Neuromuscular re-education;Aquatic Therapy;Electrical Stimulation;Iontophoresis 4mg /ml Dexamethasone;Ultrasound;Gait training;Stair training;Patient/family education;Manual techniques;Dry needling;Traction traction if appropriate    PT Next Visit Plan  Hip ROM, glute strengthening    Consulted and Agree with Plan of Care  Patient       Patient will benefit from skilled therapeutic intervention in order to improve the following deficits and impairments:  Pain, Improper body mechanics, Difficulty walking, Decreased strength  Visit Diagnosis: Pain in right hip  Difficulty in walking, not elsewhere classified     Problem List Patient Active Problem List   Diagnosis Date Noted  . Osteopenia of hip 09/04/2016  . History of bunionectomy of left great toe 07/15/2015  . H/O hammer toe correction 03/15/2015  . Arthritis of lumbar spine 01/19/2015  . Benign essential HTN 11/05/2014  . Carpal tunnel syndrome 11/05/2014  . Osteoarthritis 11/05/2014  . Dermatitis, eczematoid 11/05/2014  . Depression, major, recurrent, mild (HCC) 11/05/2014  . Dyslipidemia 11/05/2014  . Gastro-esophageal reflux disease without esophagitis 11/05/2014  . Extreme obesity 11/05/2014  . Dysmetabolic syndrome 11/05/2014  . Menopausal and perimenopausal disorder 11/05/2014  . Perennial allergic rhinitis with seasonal variation 11/05/2014  . Seborrhea capitis 11/05/2014  . Acquired trigger finger 11/05/2014  . Urinary incontinence in female 11/05/2014  . Vitamin D deficiency 11/05/2014  . Blood glucose elevated 08/31/2009    Encarnacion Chu PT, DPT 05/08/2017, 1:28 PM  [facility] PHYSICAL AND SPORTS MEDICINE [address], [place], [address] Phone: [phone]   Fax:  [phone]  Name: Monique Baker MRN: [id number] Date of  Birth: 09-Feb-1951

## 2017-05-10 ENCOUNTER — Encounter: Payer: Self-pay | Admitting: Physical Therapy

## 2017-05-10 ENCOUNTER — Other Ambulatory Visit: Payer: Self-pay

## 2017-05-10 ENCOUNTER — Ambulatory Visit: Payer: Medicare Other | Admitting: Physical Therapy

## 2017-05-10 DIAGNOSIS — M25551 Pain in right hip: Secondary | ICD-10-CM

## 2017-05-10 DIAGNOSIS — R262 Difficulty in walking, not elsewhere classified: Secondary | ICD-10-CM

## 2017-05-10 NOTE — Therapy (Signed)
[facility]IC[facility] [address] [place], [place], [address] Phone: [phone]   Fax:  [phone]  Physical Therapy Treatment  Patient Details  Name: Monique ScrapeLinda C Baker MRN: [id number] Date of Birth: Feb 02, 1951 Referring Provider: Alba CoryKrichna Sowles, MD   Encounter Date: 05/10/2017  PT End of Session - 05/10/17 0953    Visit Number  7    Number of Visits  19    Date for PT Re-Evaluation  05/31/17    PT Start Time  0951    PT Stop Time  1035    PT Time Calculation (min)  44 min    Activity Tolerance  Patient tolerated treatment well    Behavior During Therapy  [facility] for tasks assessed/performed       Past Medical History:  Diagnosis Date  . Allergy   . Depression   . GERD (gastroesophageal reflux disease)   . Hyperlipidemia   . Hypertension   . Internal hemorrhoids   . Menopausal disorder   . Osteoarthritis    hands  . Prediabetes   . Wears dentures    partial upper    Past Surgical History:  Procedure Laterality Date  . ABDOMINAL HYSTERECTOMY    . CESAREAN SECTION    . COLONOSCOPY WITH PROPOFOL N/A 03/15/2016   Procedure: COLONOSCOPY WITH PROPOFOL;  Surgeon: Kieth BrightlySeeplaputhur G Sankar, MD;  Location: [facility];  Service: Endoscopy;  Laterality: N/A;  . HALLUX VALGUS LAPIDUS Left 06/16/2015   Procedure: HALLUX VALGUS LAPIDUS;  Surgeon: Gwyneth RevelsJustin Fowler, DPM;  [facility];  Service: Podiatry;  Laterality: Left;  WITH POPLITEAL  . HAMMER TOE SURGERY Left 06/16/2015   Procedure: HAMMER TOE CORRECTION SECOND TOE LEFT;  Surgeon: Gwyneth RevelsJustin Fowler, DPM;  [facility];  Service: Podiatry;  Laterality: Left;  prediabetic - on oral meds  . MYOMECTOMY    . trigger finger Right 12/15/2014  . Vulva cyst  11/2006    There were no vitals filed for this visit.  Subjective Assessment - 05/10/17 0953    Subjective  Pt arrived late, limiting session. Pt reports she felt really good after last session.   Is feeling good today and is not limping today.      Pertinent History  R hip osteoarthritis. A couple of years ago, pt would have soreness in her R posterior hip. Had x-rays and was told that she has a hairline fracture which is healing itself. Pain stopped hurting afterwards. Pain however started from her R posterior hip to anterior hip at the joint area. Went to another doctor and was told that she has arthritis. Sometimes feels pain R medial thigh but not past her knee. Feels like her R leg would give way at times but does not fall. Her doctor does not think that she needs surgery for her R hip.  Did a lot of fixing up around her house yesterday and was on her hip a lot which bothered her hip.   Denies back pain.  Denies unexplained changes in weight.  Pain does not wake her up at night.   Pt assumes that her R hip hairline fracture is healed.   Pt husband is a Education officer, environmentalpastor and feels like she is in the "lime light." Wants to be able to walk better so people do not have to ask her if she is ok as much.     Patient Stated Goals  Walk straight without limping.     Currently in Pain?  No/denies    Pain Onset  More than a month ago       TREATMENT   LEFS: 38/80  Manual Therapy:  L S/L STM R piriformis to decrease tension and improve R hip IR ROM  Supine long axis distraction R hip in hook lying position and then R leg straight. 3x45 seconds  R hip ER stretch in supine performed by this PT to pt's tolerance with 30 second holds x3 to promote improved tolerance for ER to don/doff shoes and socks. Pt denies pain with this.   Therapeutic Exercise:  Sidelying R hip Abd 3x10, pt reports fatigue at end of each set  Standing hip machine height 2: R hip extension plate 16# X09, 60# 2x10 to promote glute max muscle use  Standing hip machine height 2: L hip extension plate 45# for 40J8 to promote glute max muscle use  Standing hip machine at [employer]: Bil hip Abd 70# 2x10  Bil leg press 3x10 with  55#                        PT Education - 05/10/17 0952    Education provided  Yes    Education Details  Exercise technique    Person(s) Educated  Patient    Methods  Explanation;Demonstration;Verbal cues    Comprehension  Verbalized understanding;Returned demonstration;Verbal cues required;Need further instruction          PT Long Term Goals - 05/10/17 0958      PT LONG TERM GOAL #1   Title  Pt will have a decrease in R hip pain to 2/10 or less at worst to promote ability to ambulate, negotiate stairs, pick up items from the floor.     Baseline  4/10 R hip pain at worst for the past 2 months (03/28/2017); 5/10 for the past 7 days (05/10/17)     Time  3    Period  Weeks    Status  On-going      PT LONG TERM GOAL #2   Title  Pt will improve R hip abduction and extension strength by at least 1/2 MMT grade to promote ability to ambulate with less R hip pain.    Baseline  On eval: R hip E: 4+/5, R hip Abd 4-/5; On 05/10/17 R hip E: 4+/5, R hip Abd 4-/5    Time  3    Period  Weeks    Status  On-going      PT LONG TERM GOAL #3   Title  Pt will improve her LEFS score by at least 9 points as a demonstration of improved function.     Baseline  37/80 (03/28/2017); 38/80 on 05/10/17    Time  3    Period  Weeks    Status  On-going      PT LONG TERM GOAL #4   Title  Pt will report being able to tolerate walking at least 10 minutes to promote mobility.     Baseline  Pt reports walking 5 minutes bothers her R hip 03/28/2017; can walk 10-15 (05/10/17)     Time  6    Period  Weeks    Status  Achieved      PT LONG TERM GOAL #5   Title  Pt will be able to don and doff socks and shoes with 4/10 pain or less for improved QOL    Baseline  7/10 pain    Time  3  Period  Weeks    Status  New            Plan - 05/10/17 0957    Clinical Impression Statement  Pt continues to report pain as high as 5/10 at it's worst in her R hip but overall feels she has made  improvement since starting therapy.  Pt is now able to ambulate 10-15 minutes before onset of R hip pain.  Pt reports significant pain in R hip with donning/doffing socks and shoes, thus stretch was introduced this session that pt tolerated well. Pt demonstrates R hip E and Abd weakness which is occasionally demonstrated in pt's gait cycle depending on her pain levels.  Introduced R hip abd against gravity with pt reporting burning in her R glute with a feeling like the muscle is working.  Pt's LEFS improved by points, suggesting a decrease in the effect of pt's R hip symptoms on her functional activities.  Pt will benefit from continued skilled PT interventions for improved strength and functional use of RLE.     Rehab Potential  Fair    Clinical Impairments Affecting Rehab Potential  Chronicity of condition    PT Frequency  2x / week    PT Duration  -- 3 weeks    PT Treatment/Interventions  Therapeutic activities;Therapeutic exercise;Neuromuscular re-education;Aquatic Therapy;Electrical Stimulation;Iontophoresis 4mg /ml Dexamethasone;Ultrasound;Gait training;Stair training;Patient/family education;Manual techniques;Dry needling;Traction traction if appropriate    PT Next Visit Plan  Hip ROM, glute strengthening    Consulted and Agree with Plan of Care  Patient       Patient will benefit from skilled therapeutic intervention in order to improve the following deficits and impairments:  Pain, Improper body mechanics, Difficulty walking, Decreased strength  Visit Diagnosis: Pain in right hip  Difficulty in walking, not elsewhere classified     Problem List Patient Active Problem List   Diagnosis Date Noted  . Osteopenia of hip 09/04/2016  . History of bunionectomy of left great toe 07/15/2015  . H/O hammer toe correction 03/15/2015  . Arthritis of lumbar spine 01/19/2015  . Benign essential HTN 11/05/2014  . Carpal tunnel syndrome 11/05/2014  . Osteoarthritis 11/05/2014  . Dermatitis,  eczematoid 11/05/2014  . Depression, major, recurrent, mild (HCC) 11/05/2014  . Dyslipidemia 11/05/2014  . Gastro-esophageal reflux disease without esophagitis 11/05/2014  . Extreme obesity 11/05/2014  . Dysmetabolic syndrome 11/05/2014  . Menopausal and perimenopausal disorder 11/05/2014  . Perennial allergic rhinitis with seasonal variation 11/05/2014  . Seborrhea capitis 11/05/2014  . Acquired trigger finger 11/05/2014  . Urinary incontinence in female 11/05/2014  . Vitamin D deficiency 11/05/2014  . Blood glucose elevated 08/31/2009    Encarnacion Chu PT, DPT 05/10/2017, 11:00 AM  [facility] PHYSICAL AND SPORTS MEDICINE [address], [place], [address] Phone: [phone]   Fax:  [phone]  Name: SHENIA ALAN MRN: [id number] Date of Birth: 07/21/1950

## 2017-05-15 ENCOUNTER — Ambulatory Visit: Payer: Medicare Other | Admitting: Physical Therapy

## 2017-05-15 ENCOUNTER — Other Ambulatory Visit: Payer: Self-pay | Admitting: Family Medicine

## 2017-05-15 ENCOUNTER — Encounter: Payer: Self-pay | Admitting: Physical Therapy

## 2017-05-15 DIAGNOSIS — M25551 Pain in right hip: Secondary | ICD-10-CM

## 2017-05-15 DIAGNOSIS — R262 Difficulty in walking, not elsewhere classified: Secondary | ICD-10-CM

## 2017-05-15 DIAGNOSIS — I10 Essential (primary) hypertension: Secondary | ICD-10-CM

## 2017-05-15 NOTE — Telephone Encounter (Signed)
Refill request for Hypertension medication:  Olmesartan-Amlodipine-HCTZ 20-5-12.5 mg  Last office visit pertaining to hypertension: 03/19/2017   BP Readings from Last 3 Encounters:  03/28/17 (!) 131/57  03/19/17 128/72  01/18/17 124/74     Lab Results  Component Value Date   CREATININE 0.71 03/19/2017   BUN 11 03/19/2017   NA 142 03/19/2017   K 3.7 03/19/2017   CL 103 03/19/2017   CO2 31 03/19/2017   Follow-up on file. 07/23/2017

## 2017-05-15 NOTE — Therapy (Signed)
[facility] [address] [place], [address] Phone: [phone]   Fax:  [phone]  Physical Therapy Treatment  Patient Details  Name: Monique Baker MRN: [id number] Date of Birth: [birth year] Referring Provider: Alba Cory, MD   Encounter Date: 05/15/2017  PT End of Session - 05/15/17 1044    Visit Number  8    Number of Visits  19    Date for PT Re-Evaluation  05/31/17    PT Start Time  1044    PT Stop Time  1114    PT Time Calculation (min)  30 min    Activity Tolerance  Patient tolerated treatment well    Behavior During Therapy  [facility] for tasks assessed/performed       Past Medical History:  Diagnosis Date  . Allergy   . Depression   . GERD (gastroesophageal reflux disease)   . Hyperlipidemia   . Hypertension   . Internal hemorrhoids   . Menopausal disorder   . Osteoarthritis    hands  . Prediabetes   . Wears dentures    partial upper    Past Surgical History:  Procedure Laterality Date  . ABDOMINAL HYSTERECTOMY    . CESAREAN SECTION    . COLONOSCOPY WITH PROPOFOL N/A 03/15/2016   Procedure: COLONOSCOPY WITH PROPOFOL;  Surgeon: Kieth Brightly, MD;  Location: [facility] ENDOSCOPY;  Service: Endoscopy;  Laterality: N/A;  . HALLUX VALGUS LAPIDUS Left 06/16/2015   Procedure: HALLUX VALGUS LAPIDUS;  Surgeon: Gwyneth Revels, DPM;  Location: [facility];  Service: Podiatry;  Laterality: Left;  WITH POPLITEAL  . HAMMER TOE SURGERY Left 06/16/2015   Procedure: HAMMER TOE CORRECTION SECOND TOE LEFT;  Surgeon: Gwyneth Revels, DPM;  Location: [facility];  Service: Podiatry;  Laterality: Left;  prediabetic - on oral meds  . MYOMECTOMY    . trigger finger Right 12/15/2014  . Vulva cyst  11/2006    There were no vitals filed for this visit.  Subjective Assessment - 05/15/17 1044    Subjective  Pt arrived late, limiting session.  Pt reports she felt "very good" after last session  and that overall shse is improving.  She feels she is moving more easily now.     Pertinent History  R hip osteoarthritis. A couple of years ago, pt would have soreness in her R posterior hip. Had x-rays and was told that she has a hairline fracture which is healing itself. Pain stopped hurting afterwards. Pain however started from her R posterior hip to anterior hip at the joint area. Went to another doctor and was told that she has arthritis. Sometimes feels pain R medial thigh but not past her knee. Feels like her R leg would give way at times but does not fall. Her doctor does not think that she needs surgery for her R hip.  Did a lot of fixing up around her house yesterday and was on her hip a lot which bothered her hip.   Denies back pain.  Denies unexplained changes in weight.  Pain does not wake her up at night.   Pt assumes that her R hip hairline fracture is healed.   Pt husband is a Education officer, environmental and feels like she is in the "lime light." Wants to be able to walk better so people do not have to ask her if she is ok as much.     Patient Stated Goals  Walk straight without limping.  Currently in Pain?  No/denies    Pain Onset  More than a month ago        TREATMENT   Manual Therapy:  L S/L STM R piriformis to decrease tension and improve R hip IR ROM  Supine long axis distraction R hip in hook lying position and then R leg straight. 3x45 seconds  R hip ER stretch in supine performed by this PT to pt's tolerance with 30 second holds x3 to promote improved tolerance for ER to don/doff shoes and socks. Pt denies pain with this.  Pt able to achieve enough R hip ER to bring R foot on L knee but feels intense stretch as well as pain in groin region   Therapeutic Exercise:  Sidelying R hip Abd 2x15, pt reports fatigue at end of each set.  Standing hip machine height 2: R hip extension plate 16# 1W96 to promote glute max muscle use  Standing hip machine height 2: L hip extension plate 04# for 5W09  to promote glute max muscle use  Standing hip machine at [employer]: Bil hip Abd 70# 2x10  Bil leg press 3x10 with 55#. Pt rates this as medium>hard                       PT Education - 05/15/17 1044    Education provided  Yes    Education Details  Exercise technique    Person(s) Educated  Patient    Methods  Explanation;Demonstration;Verbal cues    Comprehension  Verbalized understanding;Returned demonstration;Verbal cues required;Need further instruction          PT Long Term Goals - 05/10/17 0958      PT LONG TERM GOAL #1   Title  Pt will have a decrease in R hip pain to 2/10 or less at worst to promote ability to ambulate, negotiate stairs, pick up items from the floor.     Baseline  4/10 R hip pain at worst for the past 2 months (03/28/2017); 5/10 for the past 7 days (05/10/17)     Time  3    Period  Weeks    Status  On-going      PT LONG TERM GOAL #2   Title  Pt will improve R hip abduction and extension strength by at least 1/2 MMT grade to promote ability to ambulate with less R hip pain.    Baseline  On eval: R hip E: 4+/5, R hip Abd 4-/5; On 05/10/17 R hip E: 4+/5, R hip Abd 4-/5    Time  3    Period  Weeks    Status  On-going      PT LONG TERM GOAL #3   Title  Pt will improve her LEFS score by at least 9 points as a demonstration of improved function.     Baseline  37/80 (03/28/2017); 38/80 on 05/10/17    Time  3    Period  Weeks    Status  On-going      PT LONG TERM GOAL #4   Title  Pt will report being able to tolerate walking at least 10 minutes to promote mobility.     Baseline  Pt reports walking 5 minutes bothers her R hip 03/28/2017; can walk 10-15 (05/10/17)     Time  6    Period  Weeks    Status  Achieved      PT LONG TERM GOAL #5   Title  Pt will be able  to don and doff socks and shoes with 4/10 pain or less for improved QOL    Baseline  7/10 pain    Time  3    Period  Weeks    Status  New            Plan - 05/15/17 1100     Clinical Impression Statement  Pt presents with improvement in symptoms and reports she felt "very good" after last session.  Continued with manual techniques to relieve pain and improve R hip ER ROM.  Progressed strengthening exercises as able this session.  Pt tolerated all interventions well this date and will benefit from continued skilled PT interventions for improved strength and improved QOL.     Rehab Potential  Fair    Clinical Impairments Affecting Rehab Potential  Chronicity of condition    PT Frequency  2x / week    PT Duration  -- 3 weeks    PT Treatment/Interventions  Therapeutic activities;Therapeutic exercise;Neuromuscular re-education;Aquatic Therapy;Electrical Stimulation;Iontophoresis 4mg /ml Dexamethasone;Ultrasound;Gait training;Stair training;Patient/family education;Manual techniques;Dry needling;Traction traction if appropriate    PT Next Visit Plan  Hip ROM, glute strengthening    Consulted and Agree with Plan of Care  Patient       Patient will benefit from skilled therapeutic intervention in order to improve the following deficits and impairments:  Pain, Improper body mechanics, Difficulty walking, Decreased strength  Visit Diagnosis: Pain in right hip  Difficulty in walking, not elsewhere classified     Problem List Patient Active Problem List   Diagnosis Date Noted  . Osteopenia of hip 09/04/2016  . History of bunionectomy of left great toe 07/15/2015  . H/O hammer toe correction 03/15/2015  . Arthritis of lumbar spine 01/19/2015  . Benign essential HTN 11/05/2014  . Carpal tunnel syndrome 11/05/2014  . Osteoarthritis 11/05/2014  . Dermatitis, eczematoid 11/05/2014  . Depression, major, recurrent, mild (HCC) 11/05/2014  . Dyslipidemia 11/05/2014  . Gastro-esophageal reflux disease without esophagitis 11/05/2014  . Extreme obesity 11/05/2014  . Dysmetabolic syndrome 11/05/2014  . Menopausal and perimenopausal disorder 11/05/2014  . Perennial  allergic rhinitis with seasonal variation 11/05/2014  . Seborrhea capitis 11/05/2014  . Acquired trigger finger 11/05/2014  . Urinary incontinence in female 11/05/2014  . Vitamin D deficiency 11/05/2014  . Blood glucose elevated 08/31/2009     Encarnacion ChuAshley Kassaundra Hair PT, DPT 05/15/2017, 11:16 AM  [facility] [facility] [address] [place], [place], [address] Phone: [phone]   Fax:  [phone]  Name: Monique Baker MRN: [id number] Date of Birth: 1951/01/22

## 2017-05-17 ENCOUNTER — Ambulatory Visit: Payer: Medicare Other

## 2017-05-21 ENCOUNTER — Encounter: Payer: Self-pay | Admitting: Physical Therapy

## 2017-05-21 ENCOUNTER — Ambulatory Visit: Payer: Medicare Other | Admitting: Physical Therapy

## 2017-05-21 DIAGNOSIS — M25551 Pain in right hip: Secondary | ICD-10-CM | POA: Diagnosis not present

## 2017-05-21 DIAGNOSIS — R262 Difficulty in walking, not elsewhere classified: Secondary | ICD-10-CM

## 2017-05-21 NOTE — Therapy (Signed)
[place] [facility] [address] [place], [address] Phone: [phone]   Fax:  [phone]  Physical Therapy Treatment  Patient Details  Name: Monique Baker MRN: [id number] Date of Birth: March 26, 1951 Referring Provider: Alba Cory, MD   Encounter Date: 05/21/2017  PT End of Session - 05/21/17 0901    Visit Number  9    Number of Visits  19    Date for PT Re-Evaluation  05/31/17    PT Start Time  0859    PT Stop Time  0944    PT Time Calculation (min)  45 min    Activity Tolerance  Patient tolerated treatment well    Behavior During Therapy  [facility] for tasks assessed/performed       Past Medical History:  Diagnosis Date  . Allergy   . Depression   . GERD (gastroesophageal reflux disease)   . Hyperlipidemia   . Hypertension   . Internal hemorrhoids   . Menopausal disorder   . Osteoarthritis    hands  . Prediabetes   . Wears dentures    partial upper    Past Surgical History:  Procedure Laterality Date  . ABDOMINAL HYSTERECTOMY    . CESAREAN SECTION    . COLONOSCOPY WITH PROPOFOL N/A 03/15/2016   Procedure: COLONOSCOPY WITH PROPOFOL;  Surgeon: Kieth Brightly, MD;  Location: [facility];  Service: Endoscopy;  Laterality: N/A;  . HALLUX VALGUS LAPIDUS Left 06/16/2015   Procedure: HALLUX VALGUS LAPIDUS;  Surgeon: Gwyneth Revels, DPM;  Location: [facility];  Service: Podiatry;  Laterality: Left;  WITH POPLITEAL  . HAMMER TOE SURGERY Left 06/16/2015   Procedure: HAMMER TOE CORRECTION SECOND TOE LEFT;  Surgeon: Gwyneth Revels, DPM;  Location: [facility];  Service: Podiatry;  Laterality: Left;  prediabetic - on oral meds  . MYOMECTOMY    . trigger finger Right 12/15/2014  . Vulva cyst  11/2006    There were no vitals filed for this visit.  Subjective Assessment - 05/21/17 0902    Subjective  Pt reports that after leaving last session she was feeling great in regards to her R hip.   Over the weekend the pt did have some return of limping and RLE giving way due to pinch in R hip.  This specifically happens in the morning.  Pt had a fall onto her L side at the end of last week but is not having any pain.  Pt was able to brace herself by reaching out with LUE.      Pertinent History  R hip osteoarthritis. A couple of years ago, pt would have soreness in her R posterior hip. Had x-rays and was told that she has a hairline fracture which is healing itself. Pain stopped hurting afterwards. Pain however started from her R posterior hip to anterior hip at the joint area. Went to another doctor and was told that she has arthritis. Sometimes feels pain R medial thigh but not past her knee. Feels like her R leg would give way at times but does not fall. Her doctor does not think that she needs surgery for her R hip.  Did a lot of fixing up around her house yesterday and was on her hip a lot which bothered her hip.   Denies back pain.  Denies unexplained changes in weight.  Pain does not wake her up at night.   Pt assumes that her R hip hairline fracture is healed.  Pt husband is a Education officer, environmental and feels like she is in the "lime light." Wants to be able to walk better so people do not have to ask her if she is ok as much.     Patient Stated Goals  Walk straight without limping.     Currently in Pain?  No/denies    Pain Onset  More than a month ago    Multiple Pain Sites  No         TREATMENT   Manual Therapy:  Supine long axis distraction R hip in hook lying position and then R leg straight. 3x45 seconds  R hip ER stretch in supine performed by this PT to pt's tolerance with 45 second holds x3 to promote improved tolerance for ER to don/doff shoes and socks. Pt denies pain with this.   Therapeutic Exercise:  Sidelying R hip Abd 2x15, pt reports fatigue at end of each set.  Standing hip machine height 2: R hip extension plate 16# 1W96 to promote glute max muscle use  Standing hip machine  height 2: L hip extension plate 04# for 5W09 to promote glute max muscle use  Standing hip machine at [employer]: Bil hip Abd 70# 2x10  Bil leg press 2x10 with 55#. Pt rates this as medium>hard  Lateral walking with GTB x10 ft x6. Challenging for the pt. (added to HEP).                         PT Education - 05/21/17 0900    Education provided  Yes    Education Details  Exercise technique    Person(s) Educated  Patient    Methods  Explanation;Demonstration;Verbal cues    Comprehension  Verbalized understanding;Returned demonstration;Verbal cues required;Need further instruction          PT Long Term Goals - 05/10/17 8119      PT LONG TERM GOAL #1   Title  Pt will have a decrease in R hip pain to 2/10 or less at worst to promote ability to ambulate, negotiate stairs, pick up items from the floor.     Baseline  4/10 R hip pain at worst for the past 2 months (03/28/2017); 5/10 for the past 7 days (05/10/17)     Time  3    Period  Weeks    Status  On-going      PT LONG TERM GOAL #2   Title  Pt will improve R hip abduction and extension strength by at least 1/2 MMT grade to promote ability to ambulate with less R hip pain.    Baseline  On eval: R hip E: 4+/5, R hip Abd 4-/5; On 05/10/17 R hip E: 4+/5, R hip Abd 4-/5    Time  3    Period  Weeks    Status  On-going      PT LONG TERM GOAL #3   Title  Pt will improve her LEFS score by at least 9 points as a demonstration of improved function.     Baseline  37/80 (03/28/2017); 38/80 on 05/10/17    Time  3    Period  Weeks    Status  On-going      PT LONG TERM GOAL #4   Title  Pt will report being able to tolerate walking at least 10 minutes to promote mobility.     Baseline  Pt reports walking 5 minutes bothers her R hip 03/28/2017; can walk 10-15 (05/10/17)  Time  6    Period  Weeks    Status  Achieved      PT LONG TERM GOAL #5   Title  Pt will be able to don and doff socks and shoes with 4/10 pain or less for  improved QOL    Baseline  7/10 pain    Time  3    Period  Weeks    Status  New            Plan - 05/21/17 0924    Clinical Impression Statement  Discussed having pt's husband attend a future session to teach him long axis distraction for when the pt has flare ups.  Educated pt on arthritis and that arthritis is something that cannot be reversed but it's symptoms can be improved with strengthening but that they will likely never completely go away.  Advanced pt's HEP for progression of gluteal strengthening.  Pt will benefit from continued skilled PT inteventions for decreased frequency and intensity of flare ups.     Rehab Potential  Fair    Clinical Impairments Affecting Rehab Potential  Chronicity of condition    PT Frequency  2x / week    PT Duration  -- 3 weeks    PT Treatment/Interventions  Therapeutic activities;Therapeutic exercise;Neuromuscular re-education;Aquatic Therapy;Electrical Stimulation;Iontophoresis 4mg /ml Dexamethasone;Ultrasound;Gait training;Stair training;Patient/family education;Manual techniques;Dry needling;Traction traction if appropriate    PT Next Visit Plan  Hip ROM, glute strengthening    PT Home Exercise Plan  Seated IR with band, sit<>stand, sidelying hip abd, seated adductor pillow sqeezes, lateral walking with GTB    Recommended Other Services  none at this time    Consulted and Agree with Plan of Care  Patient       Patient will benefit from skilled therapeutic intervention in order to improve the following deficits and impairments:  Pain, Improper body mechanics, Difficulty walking, Decreased strength  Visit Diagnosis: Pain in right hip  Difficulty in walking, not elsewhere classified     Problem List Patient Active Problem List   Diagnosis Date Noted  . Osteopenia of hip 09/04/2016  . History of bunionectomy of left great toe 07/15/2015  . H/O hammer toe correction 03/15/2015  . Arthritis of lumbar spine 01/19/2015  . Benign essential  HTN 11/05/2014  . Carpal tunnel syndrome 11/05/2014  . Osteoarthritis 11/05/2014  . Dermatitis, eczematoid 11/05/2014  . Depression, major, recurrent, mild (HCC) 11/05/2014  . Dyslipidemia 11/05/2014  . Gastro-esophageal reflux disease without esophagitis 11/05/2014  . Extreme obesity 11/05/2014  . Dysmetabolic syndrome 11/05/2014  . Menopausal and perimenopausal disorder 11/05/2014  . Perennial allergic rhinitis with seasonal variation 11/05/2014  . Seborrhea capitis 11/05/2014  . Acquired trigger finger 11/05/2014  . Urinary incontinence in female 11/05/2014  . Vitamin D deficiency 11/05/2014  . Blood glucose elevated 08/31/2009    Encarnacion ChuAshley Jazelyn Sipe PT, DPT 05/21/2017, 9:45 AM  [place] [facility] [address] [place], [place], [address] Phone: [phone]   Fax:  [phone]  Name: Dayton ScrapeLinda C Thomassen MRN: [id number] Date of Birth: 04/28/1951

## 2017-05-23 ENCOUNTER — Ambulatory Visit: Payer: Medicare Other | Admitting: Physical Therapy

## 2017-05-23 ENCOUNTER — Encounter: Payer: Self-pay | Admitting: Physical Therapy

## 2017-05-23 DIAGNOSIS — M25551 Pain in right hip: Secondary | ICD-10-CM | POA: Diagnosis not present

## 2017-05-23 DIAGNOSIS — R262 Difficulty in walking, not elsewhere classified: Secondary | ICD-10-CM

## 2017-05-23 NOTE — Therapy (Signed)
[facility] PHYSICAL AND SPORTS MEDICINE [address] [place], [place], [address] Phone: [phone]   Fax:  [phone]  Physical Therapy Treatment  Patient Details  Name: Monique Baker MRN: [id number] Date of Birth: 16-Sep-1950 Referring Provider: Alba CoryKrichna Sowles, MD   Encounter Date: 05/23/2017  PT End of Session - 05/23/17 0857    Visit Number  10    Number of Visits  19    Date for PT Re-Evaluation  05/31/17    PT Start Time  0857    PT Stop Time  0943    PT Time Calculation (min)  46 min    Activity Tolerance  Patient tolerated treatment well    Behavior During Therapy  [facility] for tasks assessed/performed       Past Medical History:  Diagnosis Date  . Allergy   . Depression   . GERD (gastroesophageal reflux disease)   . Hyperlipidemia   . Hypertension   . Internal hemorrhoids   . Menopausal disorder   . Osteoarthritis    hands  . Prediabetes   . Wears dentures    partial upper    Past Surgical History:  Procedure Laterality Date  . ABDOMINAL HYSTERECTOMY    . CESAREAN SECTION    . COLONOSCOPY WITH PROPOFOL N/A 03/15/2016   Procedure: COLONOSCOPY WITH PROPOFOL;  Surgeon: Kieth BrightlySeeplaputhur G Sankar, MD;  Location: [facility] ENDOSCOPY;  Service: Endoscopy;  Laterality: N/A;  . HALLUX VALGUS LAPIDUS Left 06/16/2015   Procedure: HALLUX VALGUS LAPIDUS;  Surgeon: Gwyneth RevelsJustin Fowler, DPM;  [facility];  Service: Podiatry;  Laterality: Left;  WITH POPLITEAL  . HAMMER TOE SURGERY Left 06/16/2015   Procedure: HAMMER TOE CORRECTION SECOND TOE LEFT;  Surgeon: Gwyneth RevelsJustin Fowler, DPM;  [facility];  Service: Podiatry;  Laterality: Left;  prediabetic - on oral meds  . MYOMECTOMY    . trigger finger Right 12/15/2014  . Vulva cyst  11/2006    There were no vitals filed for this visit.  Subjective Assessment - 05/23/17 0858    Subjective  Pt reports that when she left after last session her hip felt well and she was able to  do the activities she wanted to.  The day after that was good until that night when she began having some of the pinching pain which has continued into this morning.      Pertinent History  R hip osteoarthritis. A couple of years ago, pt would have soreness in her R posterior hip. Had x-rays and was told that she has a hairline fracture which is healing itself. Pain stopped hurting afterwards. Pain however started from her R posterior hip to anterior hip at the joint area. Went to another doctor and was told that she has arthritis. Sometimes feels pain R medial thigh but not past her knee. Feels like her R leg would give way at times but does not fall. Her doctor does not think that she needs surgery for her R hip.  Did a lot of fixing up around her house yesterday and was on her hip a lot which bothered her hip.   Denies back pain.  Denies unexplained changes in weight.  Pain does not wake her up at night.   Pt assumes that her R hip hairline fracture is healed.   Pt husband is a Education officer, environmentalpastor and feels like she is in the "lime light." Wants to be able to walk better so people do not have to ask her if  she is ok as much.     Patient Stated Goals  Walk straight without limping.     Currently in Pain?  Yes    Pain Score  4     Pain Location  Hip    Pain Orientation  Right    Pain Descriptors / Indicators  -- pinching    Pain Onset  More than a month ago    Multiple Pain Sites  No       TREATMENT   Manual Therapy:  Supine long axis distraction R hip in hook lying position and then R leg straight. 3x45 seconds  Demonstrated to pt how to use theraband for R hip long axis distraction at part of HEP by tying band around banister or something sturdy.  R hip ER stretch in supine performed by this PT to pt's tolerance with 45 second holds x3 to promote improved tolerance for ER to don/doff shoes and socks. Pt denies pain with this.  Supine R hip lateral joint mobs 3x45 seconds with band.   Therapeutic Exercise:   Sidelying R hip Abd 2x15, pt reports fatigue at end of each set.  Standing hip machine height 2: R hip extension plate 16# 1W96 to promote glute max muscle use  Standing hip machine height 2: L hip extension plate 04# for 5W09 to promote glute max muscle use  Standing hip machine at [employer]: Bil hip Abd 70# 2x10  Bil leg press 3x10 with 55#. Pt rates this as medium>hard  Supine single knee to chest with 10 second holds x10                        PT Education - 05/23/17 0856    Education provided  Yes    Education Details  Exercise technique    Person(s) Educated  Patient    Methods  Explanation;Demonstration;Verbal cues    Comprehension  Verbalized understanding;Returned demonstration;Verbal cues required;Need further instruction          PT Long Term Goals - 05/10/17 0958      PT LONG TERM GOAL #1   Title  Pt will have a decrease in R hip pain to 2/10 or less at worst to promote ability to ambulate, negotiate stairs, pick up items from the floor.     Baseline  4/10 R hip pain at worst for the past 2 months (03/28/2017); 5/10 for the past 7 days (05/10/17)     Time  3    Period  Weeks    Status  On-going      PT LONG TERM GOAL #2   Title  Pt will improve R hip abduction and extension strength by at least 1/2 MMT grade to promote ability to ambulate with less R hip pain.    Baseline  On eval: R hip E: 4+/5, R hip Abd 4-/5; On 05/10/17 R hip E: 4+/5, R hip Abd 4-/5    Time  3    Period  Weeks    Status  On-going      PT LONG TERM GOAL #3   Title  Pt will improve her LEFS score by at least 9 points as a demonstration of improved function.     Baseline  37/80 (03/28/2017); 38/80 on 05/10/17    Time  3    Period  Weeks    Status  On-going      PT LONG TERM GOAL #4   Title  Pt will report being able  to tolerate walking at least 10 minutes to promote mobility.     Baseline  Pt reports walking 5 minutes bothers her R hip 03/28/2017; can walk 10-15 (05/10/17)      Time  6    Period  Weeks    Status  Achieved      PT LONG TERM GOAL #5   Title  Pt will be able to don and doff socks and shoes with 4/10 pain or less for improved QOL    Baseline  7/10 pain    Time  3    Period  Weeks    Status  New            Plan - 05/23/17 0901    Clinical Impression Statement  Pt continues to respond positively to interventions during session, lasting into the following night.  However, pain returns after this time.  Provided pt with way to perform R hip long axis distraction as part of HEP using theraband for hopeful longer lasting effects.  Instructed pt to perform this daily for 5 minutes and up to 2x/day if her hip is irritated.  Pt demonstrates fatigue with BLE strengthening exercises and will benefit from continued skilled PT interventions for decreased pain.     Rehab Potential  Fair    Clinical Impairments Affecting Rehab Potential  Chronicity of condition    PT Frequency  2x / week    PT Duration  -- 3 weeks    PT Treatment/Interventions  Therapeutic activities;Therapeutic exercise;Neuromuscular re-education;Aquatic Therapy;Electrical Stimulation;Iontophoresis 4mg /ml Dexamethasone;Ultrasound;Gait training;Stair training;Patient/family education;Manual techniques;Dry needling;Traction traction if appropriate    PT Next Visit Plan  Hip ROM, glute strengthening    PT Home Exercise Plan  Seated IR with band, sit<>stand, sidelying hip abd, seated adductor pillow sqeezes, lateral walking with GTB, long axis distraction using blue theraband    Consulted and Agree with Plan of Care  Patient       Patient will benefit from skilled therapeutic intervention in order to improve the following deficits and impairments:  Pain, Improper body mechanics, Difficulty walking, Decreased strength  Visit Diagnosis: Pain in right hip  Difficulty in walking, not elsewhere classified     Problem List Patient Active Problem List   Diagnosis Date Noted  . Osteopenia  of hip 09/04/2016  . History of bunionectomy of left great toe 07/15/2015  . H/O hammer toe correction 03/15/2015  . Arthritis of lumbar spine 01/19/2015  . Benign essential HTN 11/05/2014  . Carpal tunnel syndrome 11/05/2014  . Osteoarthritis 11/05/2014  . Dermatitis, eczematoid 11/05/2014  . Depression, major, recurrent, mild (HCC) 11/05/2014  . Dyslipidemia 11/05/2014  . Gastro-esophageal reflux disease without esophagitis 11/05/2014  . Extreme obesity 11/05/2014  . Dysmetabolic syndrome 11/05/2014  . Menopausal and perimenopausal disorder 11/05/2014  . Perennial allergic rhinitis with seasonal variation 11/05/2014  . Seborrhea capitis 11/05/2014  . Acquired trigger finger 11/05/2014  . Urinary incontinence in female 11/05/2014  . Vitamin D deficiency 11/05/2014  . Blood glucose elevated 08/31/2009    Encarnacion Chu PT, DPT 05/23/2017, 9:39 AM  [place] PHYSICAL AND SPORTS MEDICINE [address] [place], [address] Phone: [phone]   Fax:  [phone]  Name: Monique Baker MRN: [id number] Date of Birth: 06-24-1950

## 2017-05-24 ENCOUNTER — Ambulatory Visit: Payer: Medicare Other

## 2017-05-29 ENCOUNTER — Ambulatory Visit: Payer: Medicare Other | Admitting: Physical Therapy

## 2017-05-29 ENCOUNTER — Encounter: Payer: Self-pay | Admitting: Physical Therapy

## 2017-05-29 DIAGNOSIS — M25551 Pain in right hip: Secondary | ICD-10-CM

## 2017-05-29 DIAGNOSIS — R262 Difficulty in walking, not elsewhere classified: Secondary | ICD-10-CM

## 2017-05-29 NOTE — Therapy (Signed)
[facility] PHYSICAL AND SPORTS MEDICINE [address] [place], [address] Phone: [phone]   Fax:  [phone]  Physical Therapy Treatment  Patient Details  Name: Monique Baker MRN: [id number] Date of Birth: 1950-05-18 Referring Provider: Alba Cory, MD   Encounter Date: 05/29/2017  PT End of Session - 05/29/17 0856    Visit Number  11    Number of Visits  27    Date for PT Re-Evaluation  06/26/17    PT Start Time  0856    PT Stop Time  0944    PT Time Calculation (min)  48 min    Activity Tolerance  Patient tolerated treatment well    Behavior During Therapy  [facility] for tasks assessed/performed       Past Medical History:  Diagnosis Date  . Allergy   . Depression   . GERD (gastroesophageal reflux disease)   . Hyperlipidemia   . Hypertension   . Internal hemorrhoids   . Menopausal disorder   . Osteoarthritis    hands  . Prediabetes   . Wears dentures    partial upper    Past Surgical History:  Procedure Laterality Date  . ABDOMINAL HYSTERECTOMY    . CESAREAN SECTION    . COLONOSCOPY WITH PROPOFOL N/A 03/15/2016   Procedure: COLONOSCOPY WITH PROPOFOL;  Surgeon: Kieth Brightly, MD;  Location: [facility];  Service: Endoscopy;  Laterality: N/A;  . HALLUX VALGUS LAPIDUS Left 06/16/2015   Procedure: HALLUX VALGUS LAPIDUS;  Surgeon: Gwyneth Revels, DPM;  [facility];  Service: Podiatry;  Laterality: Left;  WITH POPLITEAL  . HAMMER TOE SURGERY Left 06/16/2015   Procedure: HAMMER TOE CORRECTION SECOND TOE LEFT;  Surgeon: Gwyneth Revels, DPM;  Location: [facility];  Service: Podiatry;  Laterality: Left;  prediabetic - on oral meds  . MYOMECTOMY    . trigger finger Right 12/15/2014  . Vulva cyst  11/2006    There were no vitals filed for this visit.  Subjective Assessment - 05/29/17 0858    Subjective  Pt did a lot of walking and moving around on Saturday at church.  Sunday was a good day  but then on Sunday evening she noticed after sitting in hard chair at dinner she had pain getting up.  Says she has some days that are great and others that are painful.  Pt tried long axis distraction as part of HEP with husband pulling on band for 10 seconds several times.  Pt only did this exercise once since last session.  Pt has been starting her day with warming up her hip in bed which seems to be helping in the morning.      Pertinent History  R hip osteoarthritis. A couple of years ago, pt would have soreness in her R posterior hip. Had x-rays and was told that she has a hairline fracture which is healing itself. Pain stopped hurting afterwards. Pain however started from her R posterior hip to anterior hip at the joint area. Went to another doctor and was told that she has arthritis. Sometimes feels pain R medial thigh but not past her knee. Feels like her R leg would give way at times but does not fall. Her doctor does not think that she needs surgery for her R hip.  Did a lot of fixing up around her house yesterday and was on her hip a lot which bothered her hip.   Denies back pain.  Denies unexplained changes  in weight.  Pain does not wake her up at night.   Pt assumes that her R hip hairline fracture is healed.   Pt husband is a Education officer, environmental and feels like she is in the "lime light." Wants to be able to walk better so people do not have to ask her if she is ok as much.     Patient Stated Goals  Walk straight without limping.     Pain Score  4     Pain Location  Hip    Pain Orientation  Right    Pain Descriptors / Indicators  Aching    Pain Onset  More than a month ago    Multiple Pain Sites  No       TREATMENT   Manual Therapy:  Supine long axis distraction R hip in hook lying position and then R leg straight. 3x45 seconds  R hip ER stretch in supine performed by this PT to pt's tolerance with 45 second holds x3 to promote improved tolerance for ER to don/doff shoes and socks. Pt denies pain  with this.  Supine R hip lateral joint mobs 3x45 seconds with band.   Therapeutic Exercise:  Supine single knee to chest with 10 second holds x5 each LE  Sidelying R hip Abd 2x15, pt reports fatigue at end of each set.  Bil leg press 3x10 with 65#. Pt rates this as medium>hard  Standing hip machine height 2: R hip extension plate 16# X09 to promote glute max muscle use  Standing hip machine height 2: L hip extension plate 60# for A54 to promote glute max muscle use  Standing hip machine at [employer]: Bil hip Abd 70# x10                        PT Education - 05/29/17 0856    Education provided  Yes    Education Details  Exercise technique; have pull on long axis distraction for at least 30 second bouts    Person(s) Educated  Patient    Methods  Explanation;Demonstration;Verbal cues    Comprehension  Verbalized understanding;Returned demonstration;Verbal cues required;Need further instruction          PT Long Term Goals - 05/29/17 0920      PT LONG TERM GOAL #1   Title  Pt will have a decrease in R hip pain to 2/10 or less at worst to promote ability to ambulate, negotiate stairs, pick up items from the floor.     Baseline  4/10 R hip pain at worst for the past 2 months (03/28/2017); 5/10 for the past 7 days (05/10/17) and (05/29/17)    Time  3    Period  Weeks    Status  On-going      PT LONG TERM GOAL #2   Title  Pt will improve R hip abduction and extension strength by at least 1/2 MMT grade to promote ability to ambulate with less R hip pain.    Baseline  On eval: R hip E: 4+/5, R hip Abd 4-/5; On 05/10/17 R hip E: 4+/5, R hip Abd 4-/5 and on (05/29/17)    Time  3    Period  Weeks    Status  On-going      PT LONG TERM GOAL #3   Title  Pt will improve her LEFS score by at least 9 points as a demonstration of improved function.     Baseline  37/80 (03/28/2017); 38/80 on  05/10/17; 51/80    Time  3    Period  Weeks    Status  Achieved      PT LONG TERM GOAL  #4   Title  Pt will report being able to tolerate walking at least 10 minutes to promote mobility.     Baseline  Pt reports walking 5 minutes bothers her R hip 03/28/2017; can walk 10-15 (05/10/17)     Time  6    Period  Weeks    Status  Achieved      PT LONG TERM GOAL #5   Title  Pt will be able to don and doff socks and shoes with 4/10 pain or less for improved QOL    Baseline  7/10 pain; 3/10    Time  3    Period  Weeks    Status  Achieved            Plan - 05/29/17 0905    Clinical Impression Statement  Instructed pt to complete long axis distraction at home 2x/day and to try to do this on her own (with her husband the first time) so she does not have to have her husband do it every time for her.  Pt reports significantly decreased pain when donning or doffing shoes/socks from 7/10 to a 3/10 pain level.  Pt reports decreased pain in R hip in the mornings with new implementation of warming up R hip in the mornings.   Pt demonstrated significant improvement on LEFS with pt surpassing goal set at initial evaluation.  Pt will benefit from continued skilled PT for decreased intensity of pain and improved QOL.    Rehab Potential  Fair    Clinical Impairments Affecting Rehab Potential  Chronicity of condition    PT Frequency  2x / week    PT Duration  4 weeks 3 weeks    PT Treatment/Interventions  Therapeutic activities;Therapeutic exercise;Neuromuscular re-education;Aquatic Therapy;Electrical Stimulation;Iontophoresis 4mg /ml Dexamethasone;Ultrasound;Gait training;Stair training;Patient/family education;Manual techniques;Dry needling;Traction traction if appropriate    PT Next Visit Plan  Hip ROM, glute strengthening    PT Home Exercise Plan  Seated IR with band, sit<>stand, sidelying hip abd, seated adductor pillow sqeezes, lateral walking with GTB, long axis distraction using blue theraband    Consulted and Agree with Plan of Care  Patient       Patient will benefit from skilled  therapeutic intervention in order to improve the following deficits and impairments:  Pain, Improper body mechanics, Difficulty walking, Decreased strength  Visit Diagnosis: Pain in right hip  Difficulty in walking, not elsewhere classified     Problem List Patient Active Problem List   Diagnosis Date Noted  . Osteopenia of hip 09/04/2016  . History of bunionectomy of left great toe 07/15/2015  . H/O hammer toe correction 03/15/2015  . Arthritis of lumbar spine 01/19/2015  . Benign essential HTN 11/05/2014  . Carpal tunnel syndrome 11/05/2014  . Osteoarthritis 11/05/2014  . Dermatitis, eczematoid 11/05/2014  . Depression, major, recurrent, mild (HCC) 11/05/2014  . Dyslipidemia 11/05/2014  . Gastro-esophageal reflux disease without esophagitis 11/05/2014  . Extreme obesity 11/05/2014  . Dysmetabolic syndrome 11/05/2014  . Menopausal and perimenopausal disorder 11/05/2014  . Perennial allergic rhinitis with seasonal variation 11/05/2014  . Seborrhea capitis 11/05/2014  . Acquired trigger finger 11/05/2014  . Urinary incontinence in female 11/05/2014  . Vitamin D deficiency 11/05/2014  . Blood glucose elevated 08/31/2009   Encarnacion Chu PT, DPT 05/29/2017, 9:42 AM  [facility] [facility]  PHYSICAL AND SPORTS MEDICINE [address] [place], [place], [address] Phone: [phone]   Fax:  [phone]  Name: Monique Baker MRN: [id number] Date of Birth: [birth year]

## 2017-05-31 ENCOUNTER — Ambulatory Visit: Payer: Medicare Other

## 2017-05-31 DIAGNOSIS — M25551 Pain in right hip: Secondary | ICD-10-CM

## 2017-05-31 DIAGNOSIS — R262 Difficulty in walking, not elsewhere classified: Secondary | ICD-10-CM

## 2017-05-31 NOTE — Therapy (Signed)
[facility] PHYSICAL AND SPORTS MEDICINE [address] [place], [place], [address] Phone: [phone]   Fax:  [phone]  Physical Therapy Treatment  Patient Details  Name: Monique Baker MRN: [id number] Date of Birth: 11-04-1950 Referring Provider: Alba CoryKrichna Sowles, MD   Encounter Date: 05/31/2017  PT End of Session - 05/31/17 0904    Visit Number  12    Number of Visits  27    Date for PT Re-Evaluation  06/26/17    PT Start Time  0901    PT Stop Time  0945    PT Time Calculation (min)  44 min    Activity Tolerance  Patient tolerated treatment well    Behavior During Therapy  [facility] for tasks assessed/performed       Past Medical History:  Diagnosis Date  . Allergy   . Depression   . GERD (gastroesophageal reflux disease)   . Hyperlipidemia   . Hypertension   . Internal hemorrhoids   . Menopausal disorder   . Osteoarthritis    hands  . Prediabetes   . Wears dentures    partial upper    Past Surgical History:  Procedure Laterality Date  . ABDOMINAL HYSTERECTOMY    . CESAREAN SECTION    . COLONOSCOPY WITH PROPOFOL N/A 03/15/2016   Procedure: COLONOSCOPY WITH PROPOFOL;  Surgeon: Kieth BrightlySeeplaputhur G Sankar, MD;  Location: [facility] ENDOSCOPY;  Service: Endoscopy;  Laterality: N/A;  . HALLUX VALGUS LAPIDUS Left 06/16/2015   Procedure: HALLUX VALGUS LAPIDUS;  Surgeon: Gwyneth RevelsJustin Fowler, DPM;  [facility];  Service: Podiatry;  Laterality: Left;  WITH POPLITEAL  . HAMMER TOE SURGERY Left 06/16/2015   Procedure: HAMMER TOE CORRECTION SECOND TOE LEFT;  Surgeon: Gwyneth RevelsJustin Fowler, DPM;  [facility];  Service: Podiatry;  Laterality: Left;  prediabetic - on oral meds  . MYOMECTOMY    . trigger finger Right 12/15/2014  . Vulva cyst  11/2006    There were no vitals filed for this visit.  Subjective Assessment - 05/31/17 0903    Subjective  Pt reports that she is doing well on this date. She reports 2/10 R hip pain at this  time. She was very active around the house yesterday and states that she "felt better doing stuff" yesterday than she has in a while. HEP is going well without questions or concerns. Husband has not been helping with long axis distraction.    Pertinent History  R hip osteoarthritis. A couple of years ago, pt would have soreness in her R posterior hip. Had x-rays and was told that she has a hairline fracture which is healing itself. Pain stopped hurting afterwards. Pain however started from her R posterior hip to anterior hip at the joint area. Went to another doctor and was told that she has arthritis. Sometimes feels pain R medial thigh but not past her knee. Feels like her R leg would give way at times but does not fall. Her doctor does not think that she needs surgery for her R hip.  Did a lot of fixing up around her house yesterday and was on her hip a lot which bothered her hip.   Denies back pain.  Denies unexplained changes in weight.  Pain does not wake her up at night.   Pt assumes that her R hip hairline fracture is healed.   Pt husband is a Education officer, environmentalpastor and feels like she is in the "lime light." Wants to be able to walk better so  people do not have to ask her if she is ok as much.     Patient Stated Goals  Walk straight without limping.     Currently in Pain?  Yes    Pain Score  2     Pain Location  Hip    Pain Orientation  Right    Pain Descriptors / Indicators  Aching    Pain Type  Chronic pain    Pain Onset  More than a month ago    Pain Frequency  Intermittent          TREATMENT  Manual Therapy   Supine long axis distraction R hip in hooklying position and then R leg straight. 3 x 30 seconds  Supine R hip medial to lateral mobilizations with belt grade II-III 30s/bout x 3 bouts; Supine R hip inferior mobilizations at 90 flexion with belt grade II-III 30s/bout x 3 bouts; R hip IR stretch in supine performed by this PT to pt's tolerance with 30 second holds x 3, significant limitations  noted in R hip IR;  Ther-ex  NuStep L1 x 5 minutes for warmup during history (unbilled); Hooklying marches with TrA contraction x 10;  L sidelying R hip abd 2 x 10, manual assist to get to end range; L sidelying R clams with manual resistance 2 x 10; Standing Matrix hip abduction 40# 2 x 10 bilateral; Standing Matrix hip extension 70# 2 x 15 RLE only;                        PT Education - 05/31/17 0904    Education provided  Yes    Education Details  Exercise form/technique    Person(s) Educated  Patient    Methods  Explanation    Comprehension  Verbalized understanding          PT Long Term Goals - 05/29/17 0920      PT LONG TERM GOAL #1   Title  Pt will have a decrease in R hip pain to 2/10 or less at worst to promote ability to ambulate, negotiate stairs, pick up items from the floor.     Baseline  4/10 R hip pain at worst for the past 2 months (03/28/2017); 5/10 for the past 7 days (05/10/17) and (05/29/17)    Time  3    Period  Weeks    Status  On-going      PT LONG TERM GOAL #2   Title  Pt will improve R hip abduction and extension strength by at least 1/2 MMT grade to promote ability to ambulate with less R hip pain.    Baseline  On eval: R hip E: 4+/5, R hip Abd 4-/5; On 05/10/17 R hip E: 4+/5, R hip Abd 4-/5 and on (05/29/17)    Time  3    Period  Weeks    Status  On-going      PT LONG TERM GOAL #3   Title  Pt will improve her LEFS score by at least 9 points as a demonstration of improved function.     Baseline  37/80 (03/28/2017); 38/80 on 05/10/17; 51/80    Time  3    Period  Weeks    Status  Achieved      PT LONG TERM GOAL #4   Title  Pt will report being able to tolerate walking at least 10 minutes to promote mobility.     Baseline  Pt reports walking 5 minutes  bothers her R hip 03/28/2017; can walk 10-15 (05/10/17)     Time  6    Period  Weeks    Status  Achieved      PT LONG TERM GOAL #5   Title  Pt will be able to don and doff socks  and shoes with 4/10 pain or less for improved QOL    Baseline  7/10 pain; 3/10    Time  3    Period  Weeks    Status  Achieved            Plan - 05/31/17 0904    Clinical Impression Statement  Pt denies any increase in pain with therapy today. She demonstrates notable limitatations in R hip IR PROM and R hip abduction strength. She is able to complete all exercises as instructed. Encouraged pt to continue with HEP and follow-up as scheduled.    Clinical Presentation  Stable    Clinical Decision Making  Low    Rehab Potential  Fair    Clinical Impairments Affecting Rehab Potential  Chronicity of condition    PT Frequency  2x / week    PT Duration  4 weeks 3 weeks    PT Treatment/Interventions  Therapeutic activities;Therapeutic exercise;Neuromuscular re-education;Aquatic Therapy;Electrical Stimulation;Iontophoresis 4mg /ml Dexamethasone;Ultrasound;Gait training;Stair training;Patient/family education;Manual techniques;Dry needling;Traction traction if appropriate    PT Next Visit Plan  Hip ROM, glute strengthening    PT Home Exercise Plan  Seated IR with band, sit<>stand, sidelying hip abd, seated adductor pillow sqeezes, lateral walking with GTB, long axis distraction using blue theraband    Consulted and Agree with Plan of Care  Patient       Patient will benefit from skilled therapeutic intervention in order to improve the following deficits and impairments:  Pain, Improper body mechanics, Difficulty walking, Decreased strength  Visit Diagnosis: Pain in right hip  Difficulty in walking, not elsewhere classified     Problem List Patient Active Problem List   Diagnosis Date Noted  . Osteopenia of hip 09/04/2016  . History of bunionectomy of left great toe 07/15/2015  . H/O hammer toe correction 03/15/2015  . Arthritis of lumbar spine 01/19/2015  . Benign essential HTN 11/05/2014  . Carpal tunnel syndrome 11/05/2014  . Osteoarthritis 11/05/2014  . Dermatitis, eczematoid  11/05/2014  . Depression, major, recurrent, mild (HCC) 11/05/2014  . Dyslipidemia 11/05/2014  . Gastro-esophageal reflux disease without esophagitis 11/05/2014  . Extreme obesity 11/05/2014  . Dysmetabolic syndrome 11/05/2014  . Menopausal and perimenopausal disorder 11/05/2014  . Perennial allergic rhinitis with seasonal variation 11/05/2014  . Seborrhea capitis 11/05/2014  . Acquired trigger finger 11/05/2014  . Urinary incontinence in female 11/05/2014  . Vitamin D deficiency 11/05/2014  . Blood glucose elevated 08/31/2009   Lynnea Maizes PT, DPT   Sadarius Norman 05/31/2017, 1:16 PM  [place] [facility] PHYSICAL AND SPORTS MEDICINE [address] [place], [address] Phone: [phone]   Fax:  [phone]  Name: BRIGETT ESTELL MRN: [id number] Date of Birth: [birth year]

## 2017-06-05 ENCOUNTER — Encounter: Payer: Self-pay | Admitting: Physical Therapy

## 2017-06-05 ENCOUNTER — Ambulatory Visit: Payer: Medicare Other | Attending: Family Medicine | Admitting: Physical Therapy

## 2017-06-05 DIAGNOSIS — R262 Difficulty in walking, not elsewhere classified: Secondary | ICD-10-CM | POA: Diagnosis present

## 2017-06-05 DIAGNOSIS — M25551 Pain in right hip: Secondary | ICD-10-CM

## 2017-06-05 NOTE — Therapy (Signed)
[facility] PHYSICAL AND SPORTS MEDICINE [address], [place], [address] Phone: [phone]   Fax:  [phone]  Physical Therapy Treatment  Patient Details  Name: Monique Baker MRN: [id number] Date of Birth: April 04, 1951 Referring Provider: Alba Cory, MD   Encounter Date: 06/05/2017  PT End of Session - 06/05/17 1027    Visit Number  13    Number of Visits  27    Date for PT Re-Evaluation  06/26/17    PT Start Time  1029    PT Stop Time  1112    PT Time Calculation (min)  43 min    Activity Tolerance  Patient tolerated treatment well    Behavior During Therapy  [facility] for tasks assessed/performed       Past Medical History:  Diagnosis Date  . Allergy   . Depression   . GERD (gastroesophageal reflux disease)   . Hyperlipidemia   . Hypertension   . Internal hemorrhoids   . Menopausal disorder   . Osteoarthritis    hands  . Prediabetes   . Wears dentures    partial upper    Past Surgical History:  Procedure Laterality Date  . ABDOMINAL HYSTERECTOMY    . CESAREAN SECTION    . COLONOSCOPY WITH PROPOFOL N/A 03/15/2016   Procedure: COLONOSCOPY WITH PROPOFOL;  Surgeon: Kieth Brightly, MD;  Location: [facility];  Service: Endoscopy;  Laterality: N/A;  . HALLUX VALGUS LAPIDUS Left 06/16/2015   Procedure: HALLUX VALGUS LAPIDUS;  Surgeon: Gwyneth Revels, DPM;  [facility];  Service: Podiatry;  Laterality: Left;  WITH POPLITEAL  . HAMMER TOE SURGERY Left 06/16/2015   Procedure: HAMMER TOE CORRECTION SECOND TOE LEFT;  Surgeon: Gwyneth Revels, DPM;  Location: [facility];  Service: Podiatry;  Laterality: Left;  prediabetic - on oral meds  . MYOMECTOMY    . trigger finger Right 12/15/2014  . Vulva cyst  11/2006    There were no vitals filed for this visit.  Subjective Assessment - 06/05/17 1032    Subjective  Pt reports that she felt good for 2-3 days after last session.  She says that going  through a routine of "warming up" R hip helped her pain in the morning.  Pt has not been completing R hip distraction at home but has brought her son in law to this session to teach him how to perform the distraction.     Pertinent History  R hip osteoarthritis. A couple of years ago, pt would have soreness in her R posterior hip. Had x-rays and was told that she has a hairline fracture which is healing itself. Pain stopped hurting afterwards. Pain however started from her R posterior hip to anterior hip at the joint area. Went to another doctor and was told that she has arthritis. Sometimes feels pain R medial thigh but not past her knee. Feels like her R leg would give way at times but does not fall. Her doctor does not think that she needs surgery for her R hip.  Did a lot of fixing up around her house yesterday and was on her hip a lot which bothered her hip.   Denies back pain.  Denies unexplained changes in weight.  Pain does not wake her up at night.   Pt assumes that her R hip hairline fracture is healed.   Pt husband is a Education officer, environmental and feels like she is in the "lime light." Wants to be able to walk  better so people do not have to ask her if she is ok as much.     Patient Stated Goals  Walk straight without limping.     Currently in Pain?  Yes    Pain Score  2     Pain Location  Hip    Pain Orientation  Right    Pain Descriptors / Indicators  Aching    Pain Type  Chronic pain    Pain Onset  More than a month ago    Multiple Pain Sites  No        TREATMENT   Manual Therapy:   Supine long axis distraction R hip in hook lying position and then R leg straight. x45 seconds by this PT demonstrating to her son in law. Then performed 3x45 seconds by son in law with pt providing feedback saying son in law performing the same as this PT.   R hip ER stretch in supine performed by this PT to pt's tolerance with 45 second holds x3 to promote improved tolerance for ER to don/doff shoes and socks. Pt  denies pain with this.   Supine R hip lateral joint mobs 3x45 seconds with belt.      Therapeutic Exercise:   Sidelying R hip Abd 2x15 with 2# ankle weight. Pt rates this exercise as hard.   SLS RLE with ball toss 3x30 seconds   Bil leg press 3x15 with 65#. Pt rates this as medium>hard   Marching in standing against GTB resistance x15 each LE with cues on control and slowly lowering leg back to stance   Standing hip machine height 2: R hip extension 70# x10   Standing hip machine height 2: L hip extension 70# for x10   Standing hip machine at Matrix: Bil hip Abd 70# x10                       PT Education - 06/05/17 1027    Education provided  Yes    Education Details  Exercise technique    Person(s) Educated  Patient    Methods  Explanation;Demonstration;Verbal cues    Comprehension  Verbalized understanding;Returned demonstration;Verbal cues required;Need further instruction          PT Long Term Goals - 05/29/17 0920      PT LONG TERM GOAL #1   Title  Pt will have a decrease in R hip pain to 2/10 or less at worst to promote ability to ambulate, negotiate stairs, pick up items from the floor.     Baseline  4/10 R hip pain at worst for the past 2 months (03/28/2017); 5/10 for the past 7 days (05/10/17) and (05/29/17)    Time  3    Period  Weeks    Status  On-going      PT LONG TERM GOAL #2   Title  Pt will improve R hip abduction and extension strength by at least 1/2 MMT grade to promote ability to ambulate with less R hip pain.    Baseline  On eval: R hip E: 4+/5, R hip Abd 4-/5; On 05/10/17 R hip E: 4+/5, R hip Abd 4-/5 and on (05/29/17)    Time  3    Period  Weeks    Status  On-going      PT LONG TERM GOAL #3   Title  Pt will improve her LEFS score by at least 9 points as a demonstration of improved function.  Baseline  37/80 (03/28/2017); 38/80 on 05/10/17; 51/80    Time  3    Period  Weeks    Status  Achieved      PT LONG TERM GOAL #4    Title  Pt will report being able to tolerate walking at least 10 minutes to promote mobility.     Baseline  Pt reports walking 5 minutes bothers her R hip 03/28/2017; can walk 10-15 (05/10/17)     Time  6    Period  Weeks    Status  Achieved      PT LONG TERM GOAL #5   Title  Pt will be able to don and doff socks and shoes with 4/10 pain or less for improved QOL    Baseline  7/10 pain; 3/10    Time  3    Period  Weeks    Status  Achieved            Plan - 06/05/17 1033    Clinical Impression Statement  Demonstrated proper technique for R hip long axis distraction to pt's son in law so he can perform with pt at home.  Hopeful that this will decrease frequency of flare ups in R hip pain if pt performs daily as this is what has given her most relief.  Pt demonstrates poor femoral control and decreased stability with R SLS activity.  Introduced Bil hip F strengthening activity as pt demonstrates weakness in R hip with this.  Pt will benefit from continued skilled PT interventions for decreased frequency of pain symptoms and improved QOL.     Rehab Potential  Fair    Clinical Impairments Affecting Rehab Potential  Chronicity of condition    PT Frequency  2x / week    PT Duration  4 weeks 3 weeks    PT Treatment/Interventions  Therapeutic activities;Therapeutic exercise;Neuromuscular re-education;Aquatic Therapy;Electrical Stimulation;Iontophoresis 4mg /ml Dexamethasone;Ultrasound;Gait training;Stair training;Patient/family education;Manual techniques;Dry needling;Traction traction if appropriate    PT Next Visit Plan  Hip ROM, glute strengthening    PT Home Exercise Plan  Seated IR with band, sit<>stand, sidelying hip abd, seated adductor pillow sqeezes, lateral walking with GTB, long axis distraction using blue theraband    Consulted and Agree with Plan of Care  Patient       Patient will benefit from skilled therapeutic intervention in order to improve the following deficits and  impairments:  Pain, Improper body mechanics, Difficulty walking, Decreased strength  Visit Diagnosis: Pain in right hip  Difficulty in walking, not elsewhere classified     Problem List Patient Active Problem List   Diagnosis Date Noted  . Osteopenia of hip 09/04/2016  . History of bunionectomy of left great toe 07/15/2015  . H/O hammer toe correction 03/15/2015  . Arthritis of lumbar spine 01/19/2015  . Benign essential HTN 11/05/2014  . Carpal tunnel syndrome 11/05/2014  . Osteoarthritis 11/05/2014  . Dermatitis, eczematoid 11/05/2014  . Depression, major, recurrent, mild (HCC) 11/05/2014  . Dyslipidemia 11/05/2014  . Gastro-esophageal reflux disease without esophagitis 11/05/2014  . Extreme obesity 11/05/2014  . Dysmetabolic syndrome 11/05/2014  . Menopausal and perimenopausal disorder 11/05/2014  . Perennial allergic rhinitis with seasonal variation 11/05/2014  . Seborrhea capitis 11/05/2014  . Acquired trigger finger 11/05/2014  . Urinary incontinence in female 11/05/2014  . Vitamin D deficiency 11/05/2014  . Blood glucose elevated 08/31/2009    Encarnacion Chu PT, DPT 06/05/2017, 11:07 AM  [place] [facility] PHYSICAL AND SPORTS MEDICINE [address]  [place], [place], [address] Phone: [phone]   Fax:  [phone]  Name: Monique Baker MRN: [id number] Date of Birth: 11-14-1950

## 2017-06-12 ENCOUNTER — Ambulatory Visit: Payer: Medicare Other

## 2017-06-12 DIAGNOSIS — R262 Difficulty in walking, not elsewhere classified: Secondary | ICD-10-CM

## 2017-06-12 DIAGNOSIS — M25551 Pain in right hip: Secondary | ICD-10-CM

## 2017-06-12 NOTE — Therapy (Signed)
[facility] [address], [place], [address] Phone: [phone]   Fax:  [phone]  Physical Therapy Treatment  Patient Details  Name: Monique Baker MRN: [id number] Date of Birth: Mar 21, 1951 Referring Provider: Alba Cory, MD   Encounter Date: 06/12/2017  PT End of Session - 06/12/17 0933    Visit Number  14    Number of Visits  27    Date for PT Re-Evaluation  06/26/17    PT Start Time  0933    PT Stop Time  1015    PT Time Calculation (min)  42 min    Activity Tolerance  Patient tolerated treatment well    Behavior During Therapy  [facility] for tasks assessed/performed       Past Medical History:  Diagnosis Date  . Allergy   . Depression   . GERD (gastroesophageal reflux disease)   . Hyperlipidemia   . Hypertension   . Internal hemorrhoids   . Menopausal disorder   . Osteoarthritis    hands  . Prediabetes   . Wears dentures    partial upper    Past Surgical History:  Procedure Laterality Date  . ABDOMINAL HYSTERECTOMY    . CESAREAN SECTION    . COLONOSCOPY WITH PROPOFOL N/A 03/15/2016   Procedure: COLONOSCOPY WITH PROPOFOL;  Surgeon: Kieth Brightly, MD;  Location: [facility];  Service: Endoscopy;  Laterality: N/A;  . HALLUX VALGUS LAPIDUS Left 06/16/2015   Procedure: HALLUX VALGUS LAPIDUS;  Surgeon: Gwyneth Revels, DPM;  Location: [facility];  Service: Podiatry;  Laterality: Left;  WITH POPLITEAL  . HAMMER TOE SURGERY Left 06/16/2015   Procedure: HAMMER TOE CORRECTION SECOND TOE LEFT;  Surgeon: Gwyneth Revels, DPM;  Location: [facility];  Service: Podiatry;  Laterality: Left;  prediabetic - on oral meds  . MYOMECTOMY    . trigger finger Right 12/15/2014  . Vulva cyst  11/2006    There were no vitals filed for this visit.  Subjective Assessment - 06/12/17 0934    Subjective  R hip feels pretty good. I think I'm making progress. Trying to do her HEP. Has been  doing the leg pulls at home. Does not feel the pain and stiffness as much as she used to compared to before she started coming. No pain currently.  2/10 at most for the past 7 days.  Better able to don and doff shoe on R foot.     Pertinent History  R hip osteoarthritis. A couple of years ago, pt would have soreness in her R posterior hip. Had x-rays and was told that she has a hairline fracture which is healing itself. Pain stopped hurting afterwards. Pain however started from her R posterior hip to anterior hip at the joint area. Went to another doctor and was told that she has arthritis. Sometimes feels pain R medial thigh but not past her knee. Feels like her R leg would give way at times but does not fall. Her doctor does not think that she needs surgery for her R hip.  Did a lot of fixing up around her house yesterday and was on her hip a lot which bothered her hip.   Denies back pain.  Denies unexplained changes in weight.  Pain does not wake her up at night.   Pt assumes that her R hip hairline fracture is healed.   Pt husband is a Education officer, environmental and feels like she is in the "lime light." Wants  to be able to walk better so people do not have to ask her if she is ok as much.     Patient Stated Goals  Walk straight without limping.     Currently in Pain?  No/denies    Pain Score  0-No pain    Pain Onset  More than a month ago                              PT Education - 06/12/17 1009    Education provided  Yes    Education Details  ther-ex    [employer]) Educated  Patient    Methods  Explanation;Demonstration;Tactile cues;Verbal cues    Comprehension  Returned demonstration;Verbalized understanding        Objectives  Manual Therapy:  Supine long axis distraction R hip in hook lying position and then R leg straight.  STM R piriformis muscle in L S/L  Improved abilty to lift R foot to don shoe with less pain      Therapeutic Exercise:   Sidelying R hip Abd  2x15 with 2# ankle weight.   Bil leg press 3x15 with 65#.  SLS RLE with L tip toe assist, 2 kg ball toss to trampoline 30 throws, then 20 throws. Difficult.   Standing hip machine height 2: R hip extension 70# x10   Standing hip machine height 2: L hip extension 70# for x10   Forward step up onto bosu with L UE assist 10x2  Standing R hip flexion 2 lbs 10x. Slight R groin discomfort.   Improved exercise technique, movement at target joints, use of target muscles after min to mod verbal, visual, tactile cues.    Pt continues to demonstrate improvement with PT towards decreased pain and improved function such as being able to better don and doff shoes based on pt subjective reports. Does well with treatment to promote R hip joint mobilty. Continued working on improving R hip joint mobility, and improving glute med and max strength.        PT Long Term Goals - 05/29/17 0920      PT LONG TERM GOAL #1   Title  Pt will have a decrease in R hip pain to 2/10 or less at worst to promote ability to ambulate, negotiate stairs, pick up items from the floor.     Baseline  4/10 R hip pain at worst for the past 2 months (03/28/2017); 5/10 for the past 7 days (05/10/17) and (05/29/17)    Time  3    Period  Weeks    Status  On-going      PT LONG TERM GOAL #2   Title  Pt will improve R hip abduction and extension strength by at least 1/2 MMT grade to promote ability to ambulate with less R hip pain.    Baseline  On eval: R hip E: 4+/5, R hip Abd 4-/5; On 05/10/17 R hip E: 4+/5, R hip Abd 4-/5 and on (05/29/17)    Time  3    Period  Weeks    Status  On-going      PT LONG TERM GOAL #3   Title  Pt will improve her LEFS score by at least 9 points as a demonstration of improved function.     Baseline  37/80 (03/28/2017); 38/80 on 05/10/17; 51/80    Time  3    Period  Weeks    Status  Achieved  PT LONG TERM GOAL #4   Title  Pt will report being able to tolerate walking at least 10 minutes  to promote mobility.     Baseline  Pt reports walking 5 minutes bothers her R hip 03/28/2017; can walk 10-15 (05/10/17)     Time  6    Period  Weeks    Status  Achieved      PT LONG TERM GOAL #5   Title  Pt will be able to don and doff socks and shoes with 4/10 pain or less for improved QOL    Baseline  7/10 pain; 3/10    Time  3    Period  Weeks    Status  Achieved            Plan - 06/12/17 0932    Clinical Impression Statement  Pt continues to demonstrate improvement with PT towards decreased pain and improved function such as being able to better don and doff shoes based on pt subjective reports. Does well with treatment to promote R hip joint mobilty. Continued working on improving R hip joint mobility, and improving glute med and max strength.     Rehab Potential  Fair    Clinical Impairments Affecting Rehab Potential  Chronicity of condition    PT Frequency  2x / week    PT Duration  4 weeks 3 weeks    PT Treatment/Interventions  Therapeutic activities;Therapeutic exercise;Neuromuscular re-education;Aquatic Therapy;Electrical Stimulation;Iontophoresis 4mg /ml Dexamethasone;Ultrasound;Gait training;Stair training;Patient/family education;Manual techniques;Dry needling;Traction traction if appropriate    PT Next Visit Plan  Hip ROM, glute strengthening    PT Home Exercise Plan  Seated IR with band, sit<>stand, sidelying hip abd, seated adductor pillow sqeezes, lateral walking with GTB, long axis distraction using blue theraband    Consulted and Agree with Plan of Care  Patient       Patient will benefit from skilled therapeutic intervention in order to improve the following deficits and impairments:  Pain, Improper body mechanics, Difficulty walking, Decreased strength  Visit Diagnosis: Pain in right hip  Difficulty in walking, not elsewhere classified     Problem List Patient Active Problem List   Diagnosis Date Noted  . Osteopenia of hip 09/04/2016  . History of  bunionectomy of left great toe 07/15/2015  . H/O hammer toe correction 03/15/2015  . Arthritis of lumbar spine 01/19/2015  . Benign essential HTN 11/05/2014  . Carpal tunnel syndrome 11/05/2014  . Osteoarthritis 11/05/2014  . Dermatitis, eczematoid 11/05/2014  . Depression, major, recurrent, mild (HCC) 11/05/2014  . Dyslipidemia 11/05/2014  . Gastro-esophageal reflux disease without esophagitis 11/05/2014  . Extreme obesity 11/05/2014  . Dysmetabolic syndrome 11/05/2014  . Menopausal and perimenopausal disorder 11/05/2014  . Perennial allergic rhinitis with seasonal variation 11/05/2014  . Seborrhea capitis 11/05/2014  . Acquired trigger finger 11/05/2014  . Urinary incontinence in female 11/05/2014  . Vitamin D deficiency 11/05/2014  . Blood glucose elevated 08/31/2009    Loralyn FreshwaterMiguel Maryan Sivak PT, DPT   06/12/2017, 9:40 PM  [facility] [address] [place], [place], [address] Phone: [phone]   Fax:  [phone]  Name: Dayton ScrapeLinda C Alberico MRN: [id number] Date of Birth: 1950/11/06

## 2017-06-14 ENCOUNTER — Ambulatory Visit: Payer: Medicare Other

## 2017-06-14 DIAGNOSIS — R262 Difficulty in walking, not elsewhere classified: Secondary | ICD-10-CM

## 2017-06-14 DIAGNOSIS — M25551 Pain in right hip: Secondary | ICD-10-CM

## 2017-06-14 NOTE — Therapy (Signed)
[facility] [address], [place], [address] Phone: [phone]   Fax:  [phone]  Physical Therapy Treatment  Patient Details  Name: Monique Baker MRN: [id number] Date of Birth: [birth year] Referring Provider: Alba Cory, MD   Encounter Date: 06/14/2017  PT End of Session - 06/14/17 0902    Visit Number  15    Number of Visits  27    Date for PT Re-Evaluation  06/26/17    PT Start Time  0902    PT Stop Time  0951    PT Time Calculation (min)  49 min    Activity Tolerance  Patient tolerated treatment well    Behavior During Therapy  [facility] for tasks assessed/performed       Past Medical History:  Diagnosis Date  . Allergy   . Depression   . GERD (gastroesophageal reflux disease)   . Hyperlipidemia   . Hypertension   . Internal hemorrhoids   . Menopausal disorder   . Osteoarthritis    hands  . Prediabetes   . Wears dentures    partial upper    Past Surgical History:  Procedure Laterality Date  . ABDOMINAL HYSTERECTOMY    . CESAREAN SECTION    . COLONOSCOPY WITH PROPOFOL N/A 03/15/2016   Procedure: COLONOSCOPY WITH PROPOFOL;  Surgeon: Kieth Brightly, MD;  Location: [facility];  Service: Endoscopy;  Laterality: N/A;  . HALLUX VALGUS LAPIDUS Left 06/16/2015   Procedure: HALLUX VALGUS LAPIDUS;  Surgeon: Gwyneth Revels, DPM;  Location: [facility];  Service: Podiatry;  Laterality: Left;  WITH POPLITEAL  . HAMMER TOE SURGERY Left 06/16/2015   Procedure: HAMMER TOE CORRECTION SECOND TOE LEFT;  Surgeon: Gwyneth Revels, DPM;  Location: [facility];  Service: Podiatry;  Laterality: Left;  prediabetic - on oral meds  . MYOMECTOMY    . trigger finger Right 12/15/2014  . Vulva cyst  11/2006    There were no vitals filed for this visit.  Subjective Assessment - 06/14/17 0902    Subjective  R hip is not so good today. Felt good after PT session Tuesday all day and all night and  yesterday morning. However, R hip pain somehow increased yesterday evening, with instances of giving way a little bit.  She went home to her hometown [place] this past weekend, and was happy. Was fine Mon, Tues. However had to go back to church activities Wed. Wondering if stress is related to her R hip pain.  Did a little more sitting Wednesday.  3/10 currently.     Pertinent History  R hip osteoarthritis. A couple of years ago, pt would have soreness in her R posterior hip. Had x-rays and was told that she has a hairline fracture which is healing itself. Pain stopped hurting afterwards. Pain however started from her R posterior hip to anterior hip at the joint area. Went to another doctor and was told that she has arthritis. Sometimes feels pain R medial thigh but not past her knee. Feels like her R leg would give way at times but does not fall. Her doctor does not think that she needs surgery for her R hip.  Did a lot of fixing up around her house yesterday and was on her hip a lot which bothered her hip.   Denies back pain.  Denies unexplained changes in weight.  Pain does not wake her up at night.   Pt assumes that her R hip  hairline fracture is healed.   Pt husband is a Education officer, environmentalpastor and feels like she is in the "lime light." Wants to be able to walk better so people do not have to ask her if she is ok as much.     Patient Stated Goals  Walk straight without limping.     Currently in Pain?  Yes    Pain Score  3     Pain Onset  More than a month ago                              PT Education - 06/14/17 0911    Education provided  Yes    Education Details  ther-ex     [employer] Educated  Patient    Methods  Explanation;Demonstration;Tactile cues;Verbal cues    Comprehension  Returned demonstration;Verbalized understanding          Objectives  Manual Therapy:  Supine long axis distraction R hip in hooklying position and then R leg straight.  STM R piriformis  muscle in L S/L     Therapeutic Exercise:   Pt was recommended to not sit longer than 1 hour at a time to decrease pressure to her R hip.   Seated hip adduction ball and glute max squeeze 15x5 seconds, hips around 90 degrees flexion      Decreased R hip pain  Side stepping, band around ankles red. 15 ft each side x 2. Felt good glute med muscle use on R hip.   Forward step up onto Air Ex pad with R LE 10x, cues for femoral control.  Lateral step up onto air ex pad with R LE 10x  Standing alternating toe taps onto treadmill platform with bilateral UE assist 10x each LE, emphasis on level pelvis  Ascending and desecending 4 regular steps with R rail assist, reciprocal pattern   Improved exercise technique, movement at target joints, use of target muscles after min to mod verbal, visual, tactile cues.   Decreased R hip pain with decreasing R piriformis tension. Continued working on improving R hip joint mobility to decrease pressure and glute strengthening to control movement. Pt will benefit from continued skilled physical therapy services to decrease pain and improve ability to perform functional tasks more comfortably for her R hip.          PT Long Term Goals - 05/29/17 0920      PT LONG TERM GOAL #1   Title  Pt will have a decrease in R hip pain to 2/10 or less at worst to promote ability to ambulate, negotiate stairs, pick up items from the floor.     Baseline  4/10 R hip pain at worst for the past 2 months (03/28/2017); 5/10 for the past 7 days (05/10/17) and (05/29/17)    Time  3    Period  Weeks    Status  On-going      PT LONG TERM GOAL #2   Title  Pt will improve R hip abduction and extension strength by at least 1/2 MMT grade to promote ability to ambulate with less R hip pain.    Baseline  On eval: R hip E: 4+/5, R hip Abd 4-/5; On 05/10/17 R hip E: 4+/5, R hip Abd 4-/5 and on (05/29/17)    Time  3    Period  Weeks    Status  On-going      PT LONG TERM GOAL  #3  Title  Pt will improve her LEFS score by at least 9 points as a demonstration of improved function.     Baseline  37/80 (03/28/2017); 38/80 on 05/10/17; 51/80    Time  3    Period  Weeks    Status  Achieved      PT LONG TERM GOAL #4   Title  Pt will report being able to tolerate walking at least 10 minutes to promote mobility.     Baseline  Pt reports walking 5 minutes bothers her R hip 03/28/2017; can walk 10-15 (05/10/17)     Time  6    Period  Weeks    Status  Achieved      PT LONG TERM GOAL #5   Title  Pt will be able to don and doff socks and shoes with 4/10 pain or less for improved QOL    Baseline  7/10 pain; 3/10    Time  3    Period  Weeks    Status  Achieved            Plan - 06/14/17 0912    Clinical Impression Statement  Decreased R hip pain with decreasing R piriformis tension. Continued working on improving R hip joint mobility to decrease pressure and glute strengthening to control movement. Pt will benefit from continued skilled physical therapy services to decrease pain and improve ability to perform functional tasks more comfortably for her R hip.     Rehab Potential  Fair    Clinical Impairments Affecting Rehab Potential  Chronicity of condition    PT Frequency  2x / week    PT Duration  4 weeks 3 weeks    PT Treatment/Interventions  Therapeutic activities;Therapeutic exercise;Neuromuscular re-education;Aquatic Therapy;Electrical Stimulation;Iontophoresis 4mg /ml Dexamethasone;Ultrasound;Gait training;Stair training;Patient/family education;Manual techniques;Dry needling;Traction traction if appropriate    PT Next Visit Plan  Hip ROM, glute strengthening    PT Home Exercise Plan  Seated IR with band, sit<>stand, sidelying hip abd, seated adductor pillow sqeezes, lateral walking with GTB, long axis distraction using blue theraband    Consulted and Agree with Plan of Care  Patient       Patient will benefit from skilled therapeutic intervention in order to  improve the following deficits and impairments:  Pain, Improper body mechanics, Difficulty walking, Decreased strength  Visit Diagnosis: Pain in right hip  Difficulty in walking, not elsewhere classified     Problem List Patient Active Problem List   Diagnosis Date Noted  . Osteopenia of hip 09/04/2016  . History of bunionectomy of left great toe 07/15/2015  . H/O hammer toe correction 03/15/2015  . Arthritis of lumbar spine 01/19/2015  . Benign essential HTN 11/05/2014  . Carpal tunnel syndrome 11/05/2014  . Osteoarthritis 11/05/2014  . Dermatitis, eczematoid 11/05/2014  . Depression, major, recurrent, mild (HCC) 11/05/2014  . Dyslipidemia 11/05/2014  . Gastro-esophageal reflux disease without esophagitis 11/05/2014  . Extreme obesity 11/05/2014  . Dysmetabolic syndrome 11/05/2014  . Menopausal and perimenopausal disorder 11/05/2014  . Perennial allergic rhinitis with seasonal variation 11/05/2014  . Seborrhea capitis 11/05/2014  . Acquired trigger finger 11/05/2014  . Urinary incontinence in female 11/05/2014  . Vitamin D deficiency 11/05/2014  . Blood glucose elevated 08/31/2009    Loralyn Freshwater PT, DPT   06/14/2017, 6:48 PM  [facility] PHYSICAL AND SPORTS MEDICINE [address] [place], [address] Phone: [phone]   Fax:  [phone]  Name: Monique Baker MRN: [id number] Date of Birth: Mar 16, 1951

## 2017-06-19 ENCOUNTER — Ambulatory Visit: Payer: Medicare Other

## 2017-06-21 ENCOUNTER — Ambulatory Visit: Payer: Medicare Other

## 2017-06-21 DIAGNOSIS — M25551 Pain in right hip: Secondary | ICD-10-CM | POA: Diagnosis not present

## 2017-06-21 DIAGNOSIS — R262 Difficulty in walking, not elsewhere classified: Secondary | ICD-10-CM

## 2017-06-21 NOTE — Therapy (Signed)
[place] [facility] PHYSICAL AND SPORTS MEDICINE [address] [place], [place], [address] Phone: [phone]   Fax:  [phone]  Physical Therapy Treatment  Patient Details  Name: Monique Baker MRN: [id number] Date of Birth: [birth year] Referring Provider: Alba CoryKrichna Sowles, MD   Encounter Date: 06/21/2017  PT End of Session - 06/21/17 0949    Visit Number  16    Number of Visits  27    Date for PT Re-Evaluation  06/26/17    PT Start Time  0949    PT Stop Time  1033    PT Time Calculation (min)  44 min    Activity Tolerance  Patient tolerated treatment well    Behavior During Therapy  [facility] for tasks assessed/performed       Past Medical History:  Diagnosis Date  . Allergy   . Depression   . GERD (gastroesophageal reflux disease)   . Hyperlipidemia   . Hypertension   . Internal hemorrhoids   . Menopausal disorder   . Osteoarthritis    hands  . Prediabetes   . Wears dentures    partial upper    Past Surgical History:  Procedure Laterality Date  . ABDOMINAL HYSTERECTOMY    . CESAREAN SECTION    . COLONOSCOPY WITH PROPOFOL N/A 03/15/2016   Procedure: COLONOSCOPY WITH PROPOFOL;  Surgeon: Kieth BrightlySeeplaputhur G Sankar, MD;  Location: [facility] ENDOSCOPY;  Service: Endoscopy;  Laterality: N/A;  . HALLUX VALGUS LAPIDUS Left 06/16/2015   Procedure: HALLUX VALGUS LAPIDUS;  Surgeon: Gwyneth RevelsJustin Fowler, DPM;  [facility];  Service: Podiatry;  Laterality: Left;  WITH POPLITEAL  . HAMMER TOE SURGERY Left 06/16/2015   Procedure: HAMMER TOE CORRECTION SECOND TOE LEFT;  Surgeon: Gwyneth RevelsJustin Fowler, DPM;  [facility];  Service: Podiatry;  Laterality: Left;  prediabetic - on oral meds  . MYOMECTOMY    . trigger finger Right 12/15/2014  . Vulva cyst  11/2006    There were no vitals filed for this visit.  Subjective Assessment - 06/21/17 0953    Subjective  R hip is pretty good. Yesterday it bothered her. She blames it on the rain. It ached a  little bit, did her exercises, and it felt better.  No R hip pain currently.  3-4/10 R hip pain at most for the past 7 days, that pain is not every day.  The elevated pain level was more frequent prior to starting PT.     Pertinent History  R hip osteoarthritis. A couple of years ago, pt would have soreness in her R posterior hip. Had x-rays and was told that she has a hairline fracture which is healing itself. Pain stopped hurting afterwards. Pain however started from her R posterior hip to anterior hip at the joint area. Went to another doctor and was told that she has arthritis. Sometimes feels pain R medial thigh but not past her knee. Feels like her R leg would give way at times but does not fall. Her doctor does not think that she needs surgery for her R hip.  Did a lot of fixing up around her house yesterday and was on her hip a lot which bothered her hip.   Denies back pain.  Denies unexplained changes in weight.  Pain does not wake her up at night.   Pt assumes that her R hip hairline fracture is healed.   Pt husband is a Education officer, environmentalpastor and feels like she is in the "lime light." Wants to be  able to walk better so people do not have to ask her if she is ok as much.     Patient Stated Goals  Walk straight without limping.     Currently in Pain?  No/denies    Pain Score  0-No pain    Pain Onset  More than a month ago                              PT Education - 06/21/17 1021    Education provided  Yes    Education Details  ther-ex    [employer]) Educated  Patient    Methods  Explanation;Demonstration;Tactile cues;Verbal cues    Comprehension  Returned demonstration;Verbalized understanding         Objectives  Manual Therapy:   Supine long axis distraction R hip in hooklying position and then R leg straight.  STM R piriformis muscle in L S/L     Therapeutic Exercise:     Forward step up onto Air Ex pad with R LE 10x3  Lateral step up onto air ex pad with  R LE 10x  Seated R hip extension isometrics, R foot on 3 inch step 10x5 seconds for 3 sets     Decreased R hip joint pain with R hip flexion afterwards. Pt also states increased ease with standing R hip flexion.   standing mini squats 5x5 seconds for 2 sets.   Standing alternating toe taps onto treadmill platform with bilateral UE assist 10x2 each LE, emphasis on level pelvis   Improved exercise technique, movement at target joints, use of target muscles after mod verbal, visual, tactile cues.   Decreased R hip joint pain with standing hip flexion following exercise to promote R glute muscle strengtheing (seated R hip extension isometrics) in addition to manual therapy to decrease joint pressure and tension from piriformis muscle.        PT Long Term Goals - 05/29/17 0920      PT LONG TERM GOAL #1   Title  Pt will have a decrease in R hip pain to 2/10 or less at worst to promote ability to ambulate, negotiate stairs, pick up items from the floor.     Baseline  4/10 R hip pain at worst for the past 2 months (03/28/2017); 5/10 for the past 7 days (05/10/17) and (05/29/17)    Time  3    Period  Weeks    Status  On-going      PT LONG TERM GOAL #2   Title  Pt will improve R hip abduction and extension strength by at least 1/2 MMT grade to promote ability to ambulate with less R hip pain.    Baseline  On eval: R hip E: 4+/5, R hip Abd 4-/5; On 05/10/17 R hip E: 4+/5, R hip Abd 4-/5 and on (05/29/17)    Time  3    Period  Weeks    Status  On-going      PT LONG TERM GOAL #3   Title  Pt will improve her LEFS score by at least 9 points as a demonstration of improved function.     Baseline  37/80 (03/28/2017); 38/80 on 05/10/17; 51/80    Time  3    Period  Weeks    Status  Achieved      PT LONG TERM GOAL #4   Title  Pt will report being able to tolerate walking at least 10 minutes  to promote mobility.     Baseline  Pt reports walking 5 minutes bothers her R hip 03/28/2017; can walk 10-15  (05/10/17)     Time  6    Period  Weeks    Status  Achieved      PT LONG TERM GOAL #5   Title  Pt will be able to don and doff socks and shoes with 4/10 pain or less for improved QOL    Baseline  7/10 pain; 3/10    Time  3    Period  Weeks    Status  Achieved            Plan - 06/21/17 1022    Clinical Impression Statement  Decreased R hip joint pain with standing hip flexion following exercise to promote R glute muscle strengtheing (seated R hip extension isometrics) in addition to manual therapy to decrease joint pressure and tension from piriformis muscle.     Rehab Potential  Fair    Clinical Impairments Affecting Rehab Potential  Chronicity of condition    PT Frequency  2x / week    PT Duration  4 weeks 3 weeks    PT Treatment/Interventions  Therapeutic activities;Therapeutic exercise;Neuromuscular re-education;Aquatic Therapy;Electrical Stimulation;Iontophoresis 4mg /ml Dexamethasone;Ultrasound;Gait training;Stair training;Patient/family education;Manual techniques;Dry needling;Traction traction if appropriate    PT Next Visit Plan  Hip ROM, glute strengthening    PT Home Exercise Plan  Seated IR with band, sit<>stand, sidelying hip abd, seated adductor pillow sqeezes, lateral walking with GTB, long axis distraction using blue theraband    Consulted and Agree with Plan of Care  Patient       Patient will benefit from skilled therapeutic intervention in order to improve the following deficits and impairments:  Pain, Improper body mechanics, Difficulty walking, Decreased strength  Visit Diagnosis: Pain in right hip  Difficulty in walking, not elsewhere classified     Problem List Patient Active Problem List   Diagnosis Date Noted  . Osteopenia of hip 09/04/2016  . History of bunionectomy of left great toe 07/15/2015  . H/O hammer toe correction 03/15/2015  . Arthritis of lumbar spine 01/19/2015  . Benign essential HTN 11/05/2014  . Carpal tunnel syndrome 11/05/2014   . Osteoarthritis 11/05/2014  . Dermatitis, eczematoid 11/05/2014  . Depression, major, recurrent, mild (HCC) 11/05/2014  . Dyslipidemia 11/05/2014  . Gastro-esophageal reflux disease without esophagitis 11/05/2014  . Extreme obesity 11/05/2014  . Dysmetabolic syndrome 11/05/2014  . Menopausal and perimenopausal disorder 11/05/2014  . Perennial allergic rhinitis with seasonal variation 11/05/2014  . Seborrhea capitis 11/05/2014  . Acquired trigger finger 11/05/2014  . Urinary incontinence in female 11/05/2014  . Vitamin D deficiency 11/05/2014  . Blood glucose elevated 08/31/2009    Loralyn Freshwater PT, DPT   06/21/2017, 9:17 PM  [place] [facility] PHYSICAL AND SPORTS MEDICINE [address], [place], [address] Phone: [phone]   Fax:  [phone]  Name: Monique Baker MRN: [id number] Date of Birth: 08/13/1950

## 2017-06-26 ENCOUNTER — Ambulatory Visit: Payer: Medicare Other

## 2017-06-26 DIAGNOSIS — M25551 Pain in right hip: Secondary | ICD-10-CM | POA: Diagnosis not present

## 2017-06-26 DIAGNOSIS — R262 Difficulty in walking, not elsewhere classified: Secondary | ICD-10-CM

## 2017-06-26 NOTE — Patient Instructions (Addendum)
  Standing L LE hang off 1st regular step with bilateral UE assist, 5 lbs ankle weight 1 min x3 to promote R hip distraction. R hip felt better per pt.     Reviewed and given as part of her HEP (2 sets daily). Pt demonstrated and verbalized understanding.

## 2017-06-26 NOTE — Therapy (Signed)
[place] [facility]ICINE [address], [place], [address] Phone: [phone]   Fax:  [phone]  Physical Therapy Treatment  Patient Details  Name: Monique Baker MRN: [id number] Date of Birth: 1950/10/11 Referring Provider: Alba Cory, MD   Encounter Date: 06/26/2017  PT End of Session - 06/26/17 0910    Visit Number  17    Number of Visits  27    Date for PT Re-Evaluation  06/26/17    PT Start Time  0910    PT Stop Time  0952    PT Time Calculation (min)  42 min    Activity Tolerance  Patient tolerated treatment well    Behavior During Therapy  [facility] for tasks assessed/performed       Past Medical History:  Diagnosis Date  . Allergy   . Depression   . GERD (gastroesophageal reflux disease)   . Hyperlipidemia   . Hypertension   . Internal hemorrhoids   . Menopausal disorder   . Osteoarthritis    hands  . Prediabetes   . Wears dentures    partial upper    Past Surgical History:  Procedure Laterality Date  . ABDOMINAL HYSTERECTOMY    . CESAREAN SECTION    . COLONOSCOPY WITH PROPOFOL N/A 03/15/2016   Procedure: COLONOSCOPY WITH PROPOFOL;  Surgeon: Kieth Brightly, MD;  Location: [facility] ENDOSCOPY;  Service: Endoscopy;  Laterality: N/A;  . HALLUX VALGUS LAPIDUS Left 06/16/2015   Procedure: HALLUX VALGUS LAPIDUS;  Surgeon: Gwyneth Revels, DPM;  [facility];  Service: Podiatry;  Laterality: Left;  WITH POPLITEAL  . HAMMER TOE SURGERY Left 06/16/2015   Procedure: HAMMER TOE CORRECTION SECOND TOE LEFT;  Surgeon: Gwyneth Revels, DPM;  Location: [facility];  Service: Podiatry;  Laterality: Left;  prediabetic - on oral meds  . MYOMECTOMY    . trigger finger Right 12/15/2014  . Vulva cyst  11/2006    There were no vitals filed for this visit.  Subjective Assessment - 06/26/17 0911    Subjective  Pt states today is not a good day. 4-5/10 currently. Pt was good Thursday, Friday,  Saturday. However pain increased Sunday morning. The night before, pt states sitting on hard bleachers Saturday evening watching a ballgame.  Could barely walk after she woke up Sunday morning.  Pt states that she was doing so well before Sunday and had 0/10 pain before that.     Pertinent History  R hip osteoarthritis. A couple of years ago, pt would have soreness in her R posterior hip. Had x-rays and was told that she has a hairline fracture which is healing itself. Pain stopped hurting afterwards. Pain however started from her R posterior hip to anterior hip at the joint area. Went to another doctor and was told that she has arthritis. Sometimes feels pain R medial thigh but not past her knee. Feels like her R leg would give way at times but does not fall. Her doctor does not think that she needs surgery for her R hip.  Did a lot of fixing up around her house yesterday and was on her hip a lot which bothered her hip.   Denies back pain.  Denies unexplained changes in weight.  Pain does not wake her up at night.   Pt assumes that her R hip hairline fracture is healed.   Pt husband is a Education officer, environmental and feels like she is in the "lime light." Wants to be able  to walk better so people do not have to ask her if she is ok as much.     Patient Stated Goals  Walk straight without limping.     Currently in Pain?  Yes    Pain Score  5  4-5/10     Pain Onset  More than a month ago                              PT Education - 06/26/17 0917    Education provided  Yes    Education Details  ther-ex, HEP    Person(s) Educated  Patient    Methods  Explanation;Demonstration;Tactile cues;Verbal cues    Comprehension  Returned demonstration;Verbalized understanding         Objectives    Therapeutic Exercise:    Seated R hip extension isometrics 10x5 seconds for 3 sets     Decreased R hip pain to 2/10 afterwards. Pt also states hip feeling better with walking   Forward step up onto  1st regular step with bilateral UE assist 10x.2 Emphasis on glute max muscle activation  Standing mini squats 5x5 seconds  Standing R hip flexion with glute squeeze throughout. 10x. Decreased R anterior hip pinching with activation of glute max muscle.  Standing bilateral shoulder extension resisting green band 10x2 with 5 second holds to promote trunk muscle activation  Seated hip adductor ball and glute max squeeze 5x10 seconds for 2 sets  Standing L LE hang off 1st regular step with bilateral UE assist, 5 lbs ankle weight 1 min x3 to promote R hip distraction. R hip felt better per pt.     Reviewed and given as part of her HEP (2 sets daily). Pt demonstrated and verbalized understanding.   Improved exercise technique, movement at target joints, use of target muscles after min to mod verbal, visual, tactile cues.    Decreased R anterior hip pain with exercises which promote activation of R glute max muscle and trunk muscle activation and R hip distraction.            PT Long Term Goals - 05/29/17 0920      PT LONG TERM GOAL #1   Title  Pt will have a decrease in R hip pain to 2/10 or less at worst to promote ability to ambulate, negotiate stairs, pick up items from the floor.     Baseline  4/10 R hip pain at worst for the past 2 months (03/28/2017); 5/10 for the past 7 days (05/10/17) and (05/29/17)    Time  3    Period  Weeks    Status  On-going      PT LONG TERM GOAL #2   Title  Pt will improve R hip abduction and extension strength by at least 1/2 MMT grade to promote ability to ambulate with less R hip pain.    Baseline  On eval: R hip E: 4+/5, R hip Abd 4-/5; On 05/10/17 R hip E: 4+/5, R hip Abd 4-/5 and on (05/29/17)    Time  3    Period  Weeks    Status  On-going      PT LONG TERM GOAL #3   Title  Pt will improve her LEFS score by at least 9 points as a demonstration of improved function.     Baseline  37/80 (03/28/2017); 38/80 on 05/10/17; 51/80    Time  3     Period  Weeks    Status  Achieved      PT LONG TERM GOAL #4   Title  Pt will report being able to tolerate walking at least 10 minutes to promote mobility.     Baseline  Pt reports walking 5 minutes bothers her R hip 03/28/2017; can walk 10-15 (05/10/17)     Time  6    Period  Weeks    Status  Achieved      PT LONG TERM GOAL #5   Title  Pt will be able to don and doff socks and shoes with 4/10 pain or less for improved QOL    Baseline  7/10 pain; 3/10    Time  3    Period  Weeks    Status  Achieved            Plan - 06/26/17 [id number]    Clinical Impression Statement  Decreased R anterior hip pain with exercises which promote activation of R glute max muscle and trunk muscle activation and R hip distraction.     Rehab Potential  Fair    Clinical Impairments Affecting Rehab Potential  Chronicity of condition    PT Frequency  2x / week    PT Duration  4 weeks 3 weeks    PT Treatment/Interventions  Therapeutic activities;Therapeutic exercise;Neuromuscular re-education;Aquatic Therapy;Electrical Stimulation;Iontophoresis 4mg /ml Dexamethasone;Ultrasound;Gait training;Stair training;Patient/family education;Manual techniques;Dry needling;Traction traction if appropriate    PT Next Visit Plan  Hip ROM, glute strengthening    PT Home Exercise Plan  Seated IR with band, sit<>stand, sidelying hip abd, seated adductor pillow sqeezes, lateral walking with GTB, long axis distraction using blue theraband    Consulted and Agree with Plan of Care  Patient       Patient will benefit from skilled therapeutic intervention in order to improve the following deficits and impairments:  Pain, Improper body mechanics, Difficulty walking, Decreased strength  Visit Diagnosis: Pain in right hip  Difficulty in walking, not elsewhere classified     Problem List Patient Active Problem List   Diagnosis Date Noted  . Osteopenia of hip 09/04/2016  . History of bunionectomy of left great toe 07/15/2015  .  H/O hammer toe correction 03/15/2015  . Arthritis of lumbar spine 01/19/2015  . Benign essential HTN 11/05/2014  . Carpal tunnel syndrome 11/05/2014  . Osteoarthritis 11/05/2014  . Dermatitis, eczematoid 11/05/2014  . Depression, major, recurrent, mild (HCC) 11/05/2014  . Dyslipidemia 11/05/2014  . Gastro-esophageal reflux disease without esophagitis 11/05/2014  . Extreme obesity 11/05/2014  . Dysmetabolic syndrome 11/05/2014  . Menopausal and perimenopausal disorder 11/05/2014  . Perennial allergic rhinitis with seasonal variation 11/05/2014  . Seborrhea capitis 11/05/2014  . Acquired trigger finger 11/05/2014  . Urinary incontinence in female 11/05/2014  . Vitamin D deficiency 11/05/2014  . Blood glucose elevated 08/31/2009    Loralyn FreshwaterMiguel Davinci Glotfelty PT, DPT   06/26/2017, 8:06 PM  [place] [facility] [address] [place], [place], [address] Phone: [phone]   Fax:  [phone]  Name: Monique Baker MRN: [id number] Date of Birth: 12-24-1950

## 2017-06-28 ENCOUNTER — Ambulatory Visit: Payer: Medicare Other

## 2017-06-28 DIAGNOSIS — M25551 Pain in right hip: Secondary | ICD-10-CM | POA: Diagnosis not present

## 2017-06-28 DIAGNOSIS — R262 Difficulty in walking, not elsewhere classified: Secondary | ICD-10-CM

## 2017-06-28 NOTE — Therapy (Signed)
[facility] [address] [place], [address] Phone: [phone]   Fax:  [phone]  Physical Therapy Treatment  Patient Details  Name: Monique Baker MRN: [id number] Date of Birth: [birth year] Referring Provider: Steele Sizer, MD   Encounter Date: 06/28/2017  PT End of Session - 06/28/17 0929    Visit Number  18    Number of Visits  35    Date for PT Re-Evaluation  07/26/17    PT Start Time  0930    PT Stop Time  1026    PT Time Calculation (min)  56 min    Activity Tolerance  Patient tolerated treatment well    Behavior During Therapy  [facility] for tasks assessed/performed       Past Medical History:  Diagnosis Date  . Allergy   . Depression   . GERD (gastroesophageal reflux disease)   . Hyperlipidemia   . Hypertension   . Internal hemorrhoids   . Menopausal disorder   . Osteoarthritis    hands  . Prediabetes   . Wears dentures    partial upper    Past Surgical History:  Procedure Laterality Date  . ABDOMINAL HYSTERECTOMY    . CESAREAN SECTION    . COLONOSCOPY WITH PROPOFOL N/A 03/15/2016   Procedure: COLONOSCOPY WITH PROPOFOL;  Surgeon: Christene Lye, MD;  Location: [facility];  Service: Endoscopy;  Laterality: N/A;  . HALLUX VALGUS LAPIDUS Left 06/16/2015   Procedure: HALLUX VALGUS LAPIDUS;  Surgeon: Samara Deist, DPM;  Location: [place];  Service: Podiatry;  Laterality: Left;  WITH POPLITEAL  . HAMMER TOE SURGERY Left 06/16/2015   Procedure: HAMMER TOE CORRECTION SECOND TOE LEFT;  Surgeon: Samara Deist, DPM;  Location: [place];  Service: Podiatry;  Laterality: Left;  prediabetic - on oral meds  . MYOMECTOMY    . trigger finger Right 12/15/2014  . Vulva cyst  11/2006    There were no vitals filed for this visit.  Subjective Assessment - 06/28/17 0930    Subjective  R hip is doing better. Sat on a pillow for cushion last night during bible study because  the chairs were hard (plastic).  No pain currently. Got the ankle weights yesterday, used 5 lbs for her ankle and it got worse. Then used it with one less weight and it helped alleviate her hip pain yesterday. Also did her exercises at home which helped.  Not sure if she is ready for discharge yet.  Feels like she is on a roll with improving her R hip pain.  5/10 R hip pain at most (Sunday) for the past 7 days.     Pertinent History  R hip osteoarthritis. A couple of years ago, pt would have soreness in her R posterior hip. Had x-rays and was told that she has a hairline fracture which is healing itself. Pain stopped hurting afterwards. Pain however started from her R posterior hip to anterior hip at the joint area. Went to another doctor and was told that she has arthritis. Sometimes feels pain R medial thigh but not past her knee. Feels like her R leg would give way at times but does not fall. Her doctor does not think that she needs surgery for her R hip.  Did a lot of fixing up around her house yesterday and was on her hip a lot which bothered her hip.   Denies back pain.  Denies unexplained changes in weight.  Pain  does not wake her up at night.   Pt assumes that her R hip hairline fracture is healed.   Pt husband is a Theme park manager and feels like she is in the "lime light." Wants to be able to walk better so people do not have to ask her if she is ok as much.     Patient Stated Goals  Walk straight without limping.     Currently in Pain?  No/denies    Pain Score  0-No pain    Pain Onset  More than a month ago         [facility] PT Assessment - 06/28/17 0944      Strength   Right Hip Extension  4+/5 seated manually resisted leg press    Right Hip ABduction  4/5    Left Hip Extension  4/5 seated manually resisted leg press    Left Hip ABduction  4/5                          PT Education - 06/28/17 1006    Education provided  Yes    Education Details  ther-ex    [employer]) Educated   Patient    Methods  Explanation;Demonstration;Tactile cues;Verbal cues    Comprehension  Returned demonstration;Verbalized understanding         Objectives  Still has discomfort when she tries to don and doff her R sock and shoe.  Next appt with Dr. Ancil Boozer is in April 2019.   Manual Therapy:  Supine long axis distraction R hip in hooklying position    Therapeutic Exercise:   Reviewed plan of care: 1x/week for 4 weeks, but can go to 2x/week if needed.     Seated R hip extension isometrics, R foot on 3 inch step 10x3 with 5 second holds    Decreased difficulty with donning/doffing R sock motion  Seated hip adductor ball/glute max squeeze 10x2 with 10 second holds to help decrease piriformis muscle tension through reciprocal inhibition    Improved ability to perform donning/doffing R sock and shoe motion  Seated bilateral shoulder extension isometrics, hands on knees 10x3 with 5 second holds   Seated manually resisted leg press, S/L hip abduction 1-2x each way for each LE.     Reviewed progress/current status with srength with pt.   Seated R hip IR 10x3 with 5 second holds (neutral hip abd/add)    Improved ability to perform donning sock motion   Standing R hip extension resisting red band 10x2 with 5 second holds to promote glute max muscle strengthening.    Improved exercise technique, movement at target joints, use of target muscles after min to mod verbal, visual, tactile cues.    Patient demonstrates overall improved function, ability to ambulate longer distances, don and doff her R sock and shoe, and overall decreased R hip pain with occasional flare ups. Pt currently performing HEP which helps decrease pain level and improve function. Pt seems to respond well to gentle hip distraction, glute max strengthening and decreasing R piriformis muscle activation. Pt to continue for another 4 more weeks to promote progress. Pt still demonstrates R hip pain, hip weakness,  and difficulty performing functional tasks and would benefit from continued skilled PT services to address the aforementioned deficits.        PT Long Term Goals - 06/28/17 1008      PT LONG TERM GOAL #1   Title  Pt will have a decrease in  R hip pain to 2/10 or less at worst to promote ability to ambulate, negotiate stairs, pick up items from the floor.     Baseline  4/10 R hip pain at worst for the past 2 months (03/28/2017); 5/10 for the past 7 days (05/10/17) and (05/29/17); 5/10 R hip pain at most for the past 7 days (06/28/2017)    Time  4    Period  Weeks    Status  On-going    Target Date  07/26/17      PT LONG TERM GOAL #2   Title  Pt will improve R hip abduction and extension strength by at least 1/2 MMT grade to promote ability to ambulate with less R hip pain.    Baseline  On eval: R hip E: 4+/5, R hip Abd 4-/5; On 05/10/17 R hip E: 4+/5, R hip Abd 4-/5 and on (05/29/17); 4+/5 R hip extension, 4/5 R hip abduction (06/28/2017)    Time  4    Period  Weeks    Status  Partially Met    Target Date  07/26/17      PT LONG TERM GOAL #3   Title  Pt will improve her LEFS score by at least 9 points as a demonstration of improved function.     Baseline  37/80 (03/28/2017); 38/80 on 05/10/17; 51/80    Time  3    Period  Weeks    Status  Achieved      PT LONG TERM GOAL #4   Title  Pt will report being able to tolerate walking at least 10 minutes to promote mobility.     Baseline  Pt reports walking 5 minutes bothers her R hip 03/28/2017; can walk 10-15 (05/10/17)     Time  6    Period  Weeks    Status  Achieved      PT LONG TERM GOAL #5   Title  Pt will be able to don and doff socks and shoes with 4/10 pain or less for improved QOL    Baseline  7/10 pain; 3/10    Time  3    Period  Weeks    Status  Achieved            Plan - 06/28/17 1007    Clinical Impression Statement  Patient demonstrates overall improved function, ability to ambulate longer distances, don and doff her  R sock and shoe, and overall decreased R hip pain with occasional flare ups. Pt currently performing HEP which helps decrease pain level and improve function. Pt seems to respond well to gentle hip distraction, glute max strengthening and decreasing R piriformis muscle activation. Pt to continue for another 4 more weeks to promote progress. Pt still demonstrates R hip pain, hip weakness, and difficulty performing functional tasks and would benefit from continued skilled PT services to address the aforementioned deficits.     History and Personal Factors relevant to plan of care:  Chronicity of condition    Clinical Presentation  Stable    Clinical Presentation due to:  improving pain level, improving function    Clinical Decision Making  Low    Rehab Potential  Fair    Clinical Impairments Affecting Rehab Potential  Chronicity of condition    PT Frequency  1x / week can go up to 2x/week if needed    PT Duration  4 weeks 3 weeks    PT Treatment/Interventions  Therapeutic activities;Therapeutic exercise;Neuromuscular re-education;Aquatic Therapy;Electrical Stimulation;Iontophoresis 18m/ml Dexamethasone;Ultrasound;Gait training;Stair training;Patient/family  education;Manual techniques;Dry needling;Traction traction if appropriate    PT Next Visit Plan  Hip ROM, glute strengthening    PT Home Exercise Plan  Seated IR with band, sit<>stand, sidelying hip abd, seated adductor pillow sqeezes, lateral walking with GTB, long axis distraction using blue theraband    Consulted and Agree with Plan of Care  Patient       Patient will benefit from skilled therapeutic intervention in order to improve the following deficits and impairments:  Pain, Improper body mechanics, Difficulty walking, Decreased strength  Visit Diagnosis: Pain in right hip - Plan: PT plan of care cert/re-cert  Difficulty in walking, not elsewhere classified - Plan: PT plan of care cert/re-cert     Problem List Patient Active Problem  List   Diagnosis Date Noted  . Osteopenia of hip 09/04/2016  . History of bunionectomy of left great toe 07/15/2015  . H/O hammer toe correction 03/15/2015  . Arthritis of lumbar spine 01/19/2015  . Benign essential HTN 11/05/2014  . Carpal tunnel syndrome 11/05/2014  . Osteoarthritis 11/05/2014  . Dermatitis, eczematoid 11/05/2014  . Depression, major, recurrent, mild ([place]) 11/05/2014  . Dyslipidemia 11/05/2014  . Gastro-esophageal reflux disease without esophagitis 11/05/2014  . Extreme obesity 11/05/2014  . Dysmetabolic syndrome 41/66/0630  . Menopausal and perimenopausal disorder 11/05/2014  . Perennial allergic rhinitis with seasonal variation 11/05/2014  . Seborrhea capitis 11/05/2014  . Acquired trigger finger 11/05/2014  . Urinary incontinence in female 11/05/2014  . Vitamin D deficiency 11/05/2014  . Blood glucose elevated 08/31/2009    Joneen Boers PT, DPT   06/28/2017, 10:51 AM  [facility] [address] [place], [address] Phone: [phone]   Fax:  [phone]  Name: Monique Baker MRN: [id number] Date of Birth: June 10, 1950

## 2017-07-05 ENCOUNTER — Ambulatory Visit: Payer: Medicare Other | Attending: Family Medicine

## 2017-07-05 DIAGNOSIS — R262 Difficulty in walking, not elsewhere classified: Secondary | ICD-10-CM | POA: Insufficient documentation

## 2017-07-05 DIAGNOSIS — M25551 Pain in right hip: Secondary | ICD-10-CM | POA: Diagnosis not present

## 2017-07-05 DIAGNOSIS — Z9181 History of falling: Secondary | ICD-10-CM | POA: Diagnosis present

## 2017-07-05 NOTE — Patient Instructions (Addendum)
Pt was recommended to perform seated L hip extension isometrics as well. When both sides feel good, pt can take a break from seated hip extension isometrics for both sides. Pt verbalized understanding.   Gave sitting with lumbar towel roll as part of her HEP. Handout provided. Pt demonstrated and verbalized understanding.

## 2017-07-05 NOTE — Therapy (Signed)
[facility] [address], [place], [address] Phone: [phone]   Fax:  [phone]  Physical Therapy Treatment  Patient Details  Name: Monique Baker MRN: [id number] Date of Birth: January 14, 1951 Referring Provider: Steele Sizer, MD   Encounter Date: 07/05/2017  PT End of Session - 07/05/17 0949    Visit Number  19    Number of Visits  35    Date for PT Re-Evaluation  07/26/17    PT Start Time  0950    PT Stop Time  1046    PT Time Calculation (min)  56 min    Activity Tolerance  Patient tolerated treatment well    Behavior During Therapy  [facility] for tasks assessed/performed       Past Medical History:  Diagnosis Date  . Allergy   . Depression   . GERD (gastroesophageal reflux disease)   . Hyperlipidemia   . Hypertension   . Internal hemorrhoids   . Menopausal disorder   . Osteoarthritis    hands  . Prediabetes   . Wears dentures    partial upper    Past Surgical History:  Procedure Laterality Date  . ABDOMINAL HYSTERECTOMY    . CESAREAN SECTION    . COLONOSCOPY WITH PROPOFOL N/A 03/15/2016   Procedure: COLONOSCOPY WITH PROPOFOL;  Surgeon: Christene Lye, MD;  Location: [facility];  Service: Endoscopy;  Laterality: N/A;  . HALLUX VALGUS LAPIDUS Left 06/16/2015   Procedure: HALLUX VALGUS LAPIDUS;  Surgeon: Samara Deist, DPM;  Location: [place];  Service: Podiatry;  Laterality: Left;  WITH POPLITEAL  . HAMMER TOE SURGERY Left 06/16/2015   Procedure: HAMMER TOE CORRECTION SECOND TOE LEFT;  Surgeon: Samara Deist, DPM;  Location: [place];  Service: Podiatry;  Laterality: Left;  prediabetic - on oral meds  . MYOMECTOMY    . trigger finger Right 12/15/2014  . Vulva cyst  11/2006    There were no vitals filed for this visit.  Subjective Assessment - 07/05/17 0951    Subjective  Pt was fine Friday, Saturday. Sunday was good until that night around 7 pm and started  feeling pain in her R hip. The pain in her R low back. Monday was better because she was up and walking around. Pt was fine and not pain. Tuesday was horrible. Pt did not go anywhere. A little pain Wednesday. Did not do as much due to the pain. 4/10 R low back to R hip.  The side leg lifts helps, and ball squeeze helps. Some days it's good, just a little but has a lot of pain about 2 days a week.  Wonders if her pain is stress related.     Pertinent History  R hip osteoarthritis. A couple of years ago, pt would have soreness in her R posterior hip. Had x-rays and was told that she has a hairline fracture which is healing itself. Pain stopped hurting afterwards. Pain however started from her R posterior hip to anterior hip at the joint area. Went to another doctor and was told that she has arthritis. Sometimes feels pain R medial thigh but not past her knee. Feels like her R leg would give way at times but does not fall. Her doctor does not think that she needs surgery for her R hip.  Did a lot of fixing up around her house yesterday and was on her hip a lot which bothered her hip.   Denies back pain.  Denies unexplained changes in weight.  Pain does not wake her up at night.   Pt assumes that her R hip hairline fracture is healed.   Pt husband is a Theme park manager and feels like she is in the "lime light." Wants to be able to walk better so people do not have to ask her if she is ok as much.     Patient Stated Goals  Walk straight without limping.     Currently in Pain?  Yes    Pain Score  4     Pain Onset  More than a month ago                              PT Education - 07/05/17 0955    Education provided  Yes    Education Details  ther-ex, HEP    Person(s) Educated  Patient    Methods  Explanation;Demonstration;Tactile cues;Verbal cues;Handout    Comprehension  Returned demonstration;Verbalized understanding         Objectives  Still has discomfort when she tries to don and  doff her R sock and shoe.  Next appt with Dr. Ancil Boozer is in April 2019.   Therapeutic Exercise:   Seated L hip extension isometrics 10x5 seconds for 3 sets. Decreased R hip and low back pain to 2/10  forward strep up with L LE onto 3 inch step with one UE assist 10x2  Standing R hip flexion 5x. R hip discomfort  Sitting with lumbar towel roll. No R low back pain. Reviewed and given as part of her HEP. Pt demonstrated and verbalized understanding.     Improved exercise technique, movement at target joints, use of target muscles after min to mod verbal, visual, tactile cues.     Manual Therapy:  Supine long axis distraction R hip in hooklying position     Then with R leg straight decreased R hip pain to 0-1/10   L S/L STM R piriformis muscle to decrease tension   Supine muscle energy to promote R hip IR secondary to stiffness, R hip in slight abduction and about  70 degrees hip flexion. Decreased R hip pinching sensation with R hip flexion    Worked on L glute max strengthening today to even out muscle activation secondary to pt working R glute max more before which helped decreased R hip and low back pain. Also continued working in improving R hip joint mobility/IR ROM secondary to stiffness which helped decrease R hip pain and anterior hip impingement sensation. Pt states R hip and low back feeling better after session.          PT Long Term Goals - 06/28/17 1008      PT LONG TERM GOAL #1   Title  Pt will have a decrease in R hip pain to 2/10 or less at worst to promote ability to ambulate, negotiate stairs, pick up items from the floor.     Baseline  4/10 R hip pain at worst for the past 2 months (03/28/2017); 5/10 for the past 7 days (05/10/17) and (05/29/17); 5/10 R hip pain at most for the past 7 days (06/28/2017)    Time  4    Period  Weeks    Status  On-going    Target Date  07/26/17      PT LONG TERM GOAL #2   Title  Pt will improve R hip abduction and  extension strength by at  least 1/2 MMT grade to promote ability to ambulate with less R hip pain.    Baseline  On eval: R hip E: 4+/5, R hip Abd 4-/5; On 05/10/17 R hip E: 4+/5, R hip Abd 4-/5 and on (05/29/17); 4+/5 R hip extension, 4/5 R hip abduction (06/28/2017)    Time  4    Period  Weeks    Status  Partially Met    Target Date  07/26/17      PT LONG TERM GOAL #3   Title  Pt will improve her LEFS score by at least 9 points as a demonstration of improved function.     Baseline  37/80 (03/28/2017); 38/80 on 05/10/17; 51/80    Time  3    Period  Weeks    Status  Achieved      PT LONG TERM GOAL #4   Title  Pt will report being able to tolerate walking at least 10 minutes to promote mobility.     Baseline  Pt reports walking 5 minutes bothers her R hip 03/28/2017; can walk 10-15 (05/10/17)     Time  6    Period  Weeks    Status  Achieved      PT LONG TERM GOAL #5   Title  Pt will be able to don and doff socks and shoes with 4/10 pain or less for improved QOL    Baseline  7/10 pain; 3/10    Time  3    Period  Weeks    Status  Achieved            Plan - 07/05/17 0958    Clinical Impression Statement  Worked on L glute max strengthening today to even out muscle activation secondary to pt working R glute max more before which helped decreased R hip and low back pain. Also continued working in improving R hip joint mobility/IR ROM secondary to stiffness which helped decrease R hip pain and anterior hip impingement sensation. Pt states R hip and low back feeling better after session.    Rehab Potential  Fair    Clinical Impairments Affecting Rehab Potential  Chronicity of condition    PT Frequency  1x / week can go up to 2x/week if needed    PT Duration  4 weeks 3 weeks    PT Treatment/Interventions  Therapeutic activities;Therapeutic exercise;Neuromuscular re-education;Aquatic Therapy;Electrical Stimulation;Iontophoresis 60m/ml Dexamethasone;Ultrasound;Gait training;Stair  training;Patient/family education;Manual techniques;Dry needling;Traction traction if appropriate    PT Next Visit Plan  Hip ROM, glute strengthening    PT Home Exercise Plan  Seated IR with band, sit<>stand, sidelying hip abd, seated adductor pillow sqeezes, lateral walking with GTB, long axis distraction using blue theraband    Consulted and Agree with Plan of Care  Patient       Patient will benefit from skilled therapeutic intervention in order to improve the following deficits and impairments:  Pain, Improper body mechanics, Difficulty walking, Decreased strength  Visit Diagnosis: Pain in right hip  Difficulty in walking, not elsewhere classified     Problem List Patient Active Problem List   Diagnosis Date Noted  . Osteopenia of hip 09/04/2016  . History of bunionectomy of left great toe 07/15/2015  . H/O hammer toe correction 03/15/2015  . Arthritis of lumbar spine 01/19/2015  . Benign essential HTN 11/05/2014  . Carpal tunnel syndrome 11/05/2014  . Osteoarthritis 11/05/2014  . Dermatitis, eczematoid 11/05/2014  . Depression, major, recurrent, mild ([place] 11/05/2014  . Dyslipidemia 11/05/2014  . Gastro-esophageal  reflux disease without esophagitis 11/05/2014  . Extreme obesity 11/05/2014  . Dysmetabolic syndrome 55/37/4827  . Menopausal and perimenopausal disorder 11/05/2014  . Perennial allergic rhinitis with seasonal variation 11/05/2014  . Seborrhea capitis 11/05/2014  . Acquired trigger finger 11/05/2014  . Urinary incontinence in female 11/05/2014  . Vitamin D deficiency 11/05/2014  . Blood glucose elevated 08/31/2009    Joneen Boers PT, DPT   07/05/2017, 11:02 AM  [facility] [address] [place], [address] Phone: [phone]   Fax:  [phone]  Name: Monique Baker MRN: [id number] Date of Birth: [birth year]

## 2017-07-11 ENCOUNTER — Ambulatory Visit: Payer: Medicare Other

## 2017-07-11 DIAGNOSIS — M25551 Pain in right hip: Secondary | ICD-10-CM

## 2017-07-11 DIAGNOSIS — R262 Difficulty in walking, not elsewhere classified: Secondary | ICD-10-CM

## 2017-07-11 NOTE — Therapy (Signed)
[facility] [address], [place], [address] Phone: [phone]   Fax:  [phone]  Physical Therapy Treatment  Patient Details  Name: Monique Baker MRN: [id number] Date of Birth: 1950-12-10 Referring Provider: Steele Sizer, MD   Encounter Date: 07/11/2017  PT End of Session - 07/11/17 0852    Visit Number  20    Number of Visits  35    Date for PT Re-Evaluation  07/26/17    PT Start Time  0852    PT Stop Time  0940    PT Time Calculation (min)  48 min    Activity Tolerance  Patient tolerated treatment well    Behavior During Therapy  [facility] for tasks assessed/performed       Past Medical History:  Diagnosis Date  . Allergy   . Depression   . GERD (gastroesophageal reflux disease)   . Hyperlipidemia   . Hypertension   . Internal hemorrhoids   . Menopausal disorder   . Osteoarthritis    hands  . Prediabetes   . Wears dentures    partial upper    Past Surgical History:  Procedure Laterality Date  . ABDOMINAL HYSTERECTOMY    . CESAREAN SECTION    . COLONOSCOPY WITH PROPOFOL N/A 03/15/2016   Procedure: COLONOSCOPY WITH PROPOFOL;  Surgeon: Christene Lye, MD;  Location: [facility];  Service: Endoscopy;  Laterality: N/A;  . HALLUX VALGUS LAPIDUS Left 06/16/2015   Procedure: HALLUX VALGUS LAPIDUS;  Surgeon: Samara Deist, DPM;  Location: [place];  Service: Podiatry;  Laterality: Left;  WITH POPLITEAL  . HAMMER TOE SURGERY Left 06/16/2015   Procedure: HAMMER TOE CORRECTION SECOND TOE LEFT;  Surgeon: Samara Deist, DPM;  Location: [place];  Service: Podiatry;  Laterality: Left;  prediabetic - on oral meds  . MYOMECTOMY    . trigger finger Right 12/15/2014  . Vulva cyst  11/2006    There were no vitals filed for this visit.  Subjective Assessment - 07/11/17 0853    Subjective  R hip pain improved tremendously after last session including Sunday. Monday was ok.  Started back up again a little bit yesterday. The pulling at the hip joint helps. I can see improvement.  Yesterday, Tuesday was a 3/10. The other days were about a 0-1/10.     Pertinent History  R hip osteoarthritis. A couple of years ago, pt would have soreness in her R posterior hip. Had x-rays and was told that she has a hairline fracture which is healing itself. Pain stopped hurting afterwards. Pain however started from her R posterior hip to anterior hip at the joint area. Went to another doctor and was told that she has arthritis. Sometimes feels pain R medial thigh but not past her knee. Feels like her R leg would give way at times but does not fall. Her doctor does not think that she needs surgery for her R hip.  Did a lot of fixing up around her house yesterday and was on her hip a lot which bothered her hip.   Denies back pain.  Denies unexplained changes in weight.  Pain does not wake her up at night.   Pt assumes that her R hip hairline fracture is healed.   Pt husband is a Theme [employer] manager and feels like she is in the "lime light." Wants to be able to walk better so people do not have to ask her if she is ok as much.  Patient Stated Goals  Walk straight without limping.     Currently in Pain?  Yes    Pain Score  2     Pain Onset  More than a month ago                              PT Education - 07/11/17 0933    Education provided  Yes    Education Details  ther-ex, HEP    Person(s) Educated  Patient    Methods  Explanation;Demonstration;Tactile cues;Verbal cues;Handout    Comprehension  Returned demonstration;Verbalized understanding         Objectives Next appt with Dr. Ancil Boozer is in April 2019.    Therapeutic Exercise:    forward strep up with L LE onto 3 inch step with no UE assist 10x3  Standing R hip abduction resisting yellow band 10x2 with bilateral UE assist  Static mini lunge (L foot back) 5x, then 10x with L UE assist to promote glute muscle  work  Improved ability to bring R foot up to don/doff sock/shoe afterwards  Seated R hip ER 3 lbs 3x. Difficult     Then 2 lbs 5x3 Decreased R anterior hip pinching  Seated hip adduction ball and glute max squeeze 5x 10 seconds    Improved ability to cross R leg over L to don/doff shoe/sock   Seated bilateral shoulder extension isometrics, hands on thighs 10x5 seconds for 2 sets to promote trunk muscle strengthening    Reviewed and given as part of his HEP. Handout provided.   Improved exercise technique, movement at target joints, use of target muscles after min to mod verbal, visual, tactile cues.    Manual Therapy:   Supine long axis distraction R hip in hooklying position                         Then with R leg straight    Supine muscle energy technique to promote R hip IR secondary to stiffness, R hip in slight abduction and about  70 degrees hip flexion. Decreased R hip pinching sensation with R hip flexion     Decreased R anterior hip pinching sensation with treatment that promote R glute med and max strength, L glute max muscle strength, trunk strength, and decreasing piriformis muscle activation as well as decreasing pressure to R hip joint.      PT Long Term Goals - 06/28/17 1008      PT LONG TERM GOAL #1   Title  Pt will have a decrease in R hip pain to 2/10 or less at worst to promote ability to ambulate, negotiate stairs, pick up items from the floor.     Baseline  4/10 R hip pain at worst for the past 2 months (03/28/2017); 5/10 for the past 7 days (05/10/17) and (05/29/17); 5/10 R hip pain at most for the past 7 days (06/28/2017)    Time  4    Period  Weeks    Status  On-going    Target Date  07/26/17      PT LONG TERM GOAL #2   Title  Pt will improve R hip abduction and extension strength by at least 1/2 MMT grade to promote ability to ambulate with less R hip pain.    Baseline  On eval: R hip E: 4+/5, R hip Abd 4-/5; On 05/10/17 R hip E: 4+/5, R hip Abd  4-/5 and on (05/29/17); 4+/5 R hip extension, 4/5 R hip abduction (06/28/2017)    Time  4    Period  Weeks    Status  Partially Met    Target Date  07/26/17      PT LONG TERM GOAL #3   Title  Pt will improve her LEFS score by at least 9 points as a demonstration of improved function.     Baseline  37/80 (03/28/2017); 38/80 on 05/10/17; 51/80    Time  3    Period  Weeks    Status  Achieved      PT LONG TERM GOAL #4   Title  Pt will report being able to tolerate walking at least 10 minutes to promote mobility.     Baseline  Pt reports walking 5 minutes bothers her R hip 03/28/2017; can walk 10-15 (05/10/17)     Time  6    Period  Weeks    Status  Achieved      PT LONG TERM GOAL #5   Title  Pt will be able to don and doff socks and shoes with 4/10 pain or less for improved QOL    Baseline  7/10 pain; 3/10    Time  3    Period  Weeks    Status  Achieved            Plan - 07/11/17 0936    Clinical Impression Statement  Decreased R anterior hip pinching sensation with treatment that promote R glute med and max strength, L glute max muscle strength, trunk strength, and decreasing piriformis muscle activation as well as decreasing pressure to R hip joint.     Rehab Potential  Fair    Clinical Impairments Affecting Rehab Potential  Chronicity of condition    PT Frequency  1x / week can go up to 2x/week if needed    PT Duration  4 weeks 3 weeks    PT Treatment/Interventions  Therapeutic activities;Therapeutic exercise;Neuromuscular re-education;Aquatic Therapy;Electrical Stimulation;Iontophoresis 11m/ml Dexamethasone;Ultrasound;Gait training;Stair training;Patient/family education;Manual techniques;Dry needling;Traction traction if appropriate    PT Next Visit Plan  Hip ROM, glute strengthening    PT Home Exercise Plan  Seated IR with band, sit<>stand, sidelying hip abd, seated adductor pillow sqeezes, lateral walking with GTB, long axis distraction using blue theraband    Consulted and  Agree with Plan of Care  Patient       Patient will benefit from skilled therapeutic intervention in order to improve the following deficits and impairments:  Pain, Improper body mechanics, Difficulty walking, Decreased strength  Visit Diagnosis: Pain in right hip  Difficulty in walking, not elsewhere classified     Problem List Patient Active Problem List   Diagnosis Date Noted  . Osteopenia of hip 09/04/2016  . History of bunionectomy of left great toe 07/15/2015  . H/O hammer toe correction 03/15/2015  . Arthritis of lumbar spine 01/19/2015  . Benign essential HTN 11/05/2014  . Carpal tunnel syndrome 11/05/2014  . Osteoarthritis 11/05/2014  . Dermatitis, eczematoid 11/05/2014  . Depression, major, recurrent, mild ([place] 11/05/2014  . Dyslipidemia 11/05/2014  . Gastro-esophageal reflux disease without esophagitis 11/05/2014  . Extreme obesity 11/05/2014  . Dysmetabolic syndrome 016/01/9603 . Menopausal and perimenopausal disorder 11/05/2014  . Perennial allergic rhinitis with seasonal variation 11/05/2014  . Seborrhea capitis 11/05/2014  . Acquired trigger finger 11/05/2014  . Urinary incontinence in female 11/05/2014  . Vitamin D deficiency 11/05/2014  . Blood glucose elevated 08/31/2009    Keyshawn Hellwig  Reverie Vaquera PT, DPT   07/11/2017, 10:08 AM  [facility] [address] [place], [address] Phone: [phone]   Fax:  [phone]  Name: Monique Baker MRN: [id number] Date of Birth: 1950-12-14

## 2017-07-11 NOTE — Patient Instructions (Signed)
Seated bilateral shoulder extension isometrics  Sitting on a chair   Gently press your hands on your thighs to feel your trunk muscles activate.    Hold for 5 seconds.    Repeat 10 times.   Perform 3 sets daily.

## 2017-07-18 ENCOUNTER — Ambulatory Visit: Payer: Medicare Other

## 2017-07-19 ENCOUNTER — Ambulatory Visit: Payer: Medicare Other

## 2017-07-19 DIAGNOSIS — M25551 Pain in right hip: Secondary | ICD-10-CM | POA: Diagnosis not present

## 2017-07-19 DIAGNOSIS — R262 Difficulty in walking, not elsewhere classified: Secondary | ICD-10-CM

## 2017-07-19 NOTE — Therapy (Signed)
[facility] [address] [place], [address] Phone: [phone]   Fax:  [phone]  Physical Therapy Treatment  Patient Details  Name: Monique Baker MRN: [id number] Date of Birth: 01-21-1951 Referring Provider: Steele Sizer, MD   Encounter Date: 07/19/2017  PT End of Session - 07/19/17 1529    Visit Number  21    Number of Visits  35    Date for PT Re-Evaluation  07/26/17    PT Start Time  8250 pt arrived late    PT Stop Time  1601    PT Time Calculation (min)  30 min    Activity Tolerance  Patient tolerated treatment well    Behavior During Therapy  [facility] for tasks assessed/performed       Past Medical History:  Diagnosis Date  . Allergy   . Depression   . GERD (gastroesophageal reflux disease)   . Hyperlipidemia   . Hypertension   . Internal hemorrhoids   . Menopausal disorder   . Osteoarthritis    hands  . Prediabetes   . Wears dentures    partial upper    Past Surgical History:  Procedure Laterality Date  . ABDOMINAL HYSTERECTOMY    . CESAREAN SECTION    . COLONOSCOPY WITH PROPOFOL N/A 03/15/2016   Procedure: COLONOSCOPY WITH PROPOFOL;  Surgeon: Christene Lye, MD;  Location: [facility];  Service: Endoscopy;  Laterality: N/A;  . HALLUX VALGUS LAPIDUS Left 06/16/2015   Procedure: HALLUX VALGUS LAPIDUS;  Surgeon: Samara Deist, DPM;  Location: [place] [facility];  Service: Podiatry;  Laterality: Left;  WITH POPLITEAL  . HAMMER TOE SURGERY Left 06/16/2015   Procedure: HAMMER TOE CORRECTION SECOND TOE LEFT;  Surgeon: Samara Deist, DPM;  Location: [place];  Service: Podiatry;  Laterality: Left;  prediabetic - on oral meds  . MYOMECTOMY    . trigger finger Right 12/15/2014  . Vulva cyst  11/2006    There were no vitals filed for this visit.  Subjective Assessment - 07/19/17 1532    Subjective  R hip is doing fairly well. Hip did not bother her until yesterday when  she was stressed out about her car. 2/10 R hip pain at most for the past week.  Sunday was pretty good. Did not have any problems. Wore heels for a little bit. Had a little misshap Sunday night. Fell asleep on a heating pad on her R posterior hip and had a blister.  The heating pad does help.     Pertinent History  R hip osteoarthritis. A couple of years ago, pt would have soreness in her R posterior hip. Had x-rays and was told that she has a hairline fracture which is healing itself. Pain stopped hurting afterwards. Pain however started from her R posterior hip to anterior hip at the joint area. Went to another doctor and was told that she has arthritis. Sometimes feels pain R medial thigh but not past her knee. Feels like her R leg would give way at times but does not fall. Her doctor does not think that she needs surgery for her R hip.  Did a lot of fixing up around her house yesterday and was on her hip a lot which bothered her hip.   Denies back pain.  Denies unexplained changes in weight.  Pain does not wake her up at night.   Pt assumes that her R hip hairline fracture is healed.   Pt husband is  a pastor and feels like she is in the "lime light." Wants to be able to walk better so people do not have to ask her if she is ok as much.     Patient Stated Goals  Walk straight without limping.     Currently in Pain?  No/denies    Pain Score  0-No pain    Pain Onset  More than a month ago                              PT Education - 07/19/17 1538    Education provided  Yes    Education Details  ther-ex, HEP    Person(s) Educated  Patient    Methods  Explanation;Demonstration;Tactile cues;Verbal cues;Handout    Comprehension  Returned demonstration;Verbalized understanding         Objectives Next appt with Dr. Ancil Boozer is in April 2019.  Pt states blister R posterior hip from heating pad after falling asleep on it   Therapeutic Exercise:    forward step up with  L LE onto 3 inch step with no UE assist 15x2  Standing R hip abduction resisting yellow band 10x2 with bilateral UE assist  Static mini lunge (L foot back) 10x2  with L UE assist to promote glute muscle work   Supine muscle energy technique to promote R hip IR secondary to stiffness, R hip in slight abduction and about 70 degrees hip flexion.   Standing low rows resisting green band 10x5 seconds for 2 sets to promote trunk muscle strengthening  Standing R hip flexion with glute max squeeze on the return motion 10x. decreased R hip pinching.       Improved exercise technique, movement at target joints, use of target muscles after min to mod verbal, visual, tactile cues.    Pt demonstrates good progress towards pain goals based on subjective reports. Continued working on R glute med and max strength and trunk strength to help decrease R hip pain and improve ability to perform functional tasks. Pt tolerated session well without aggravation of symptoms.          PT Long Term Goals - 06/28/17 1008      PT LONG TERM GOAL #1   Title  Pt will have a decrease in R hip pain to 2/10 or less at worst to promote ability to ambulate, negotiate stairs, pick up items from the floor.     Baseline  4/10 R hip pain at worst for the past 2 months (03/28/2017); 5/10 for the past 7 days (05/10/17) and (05/29/17); 5/10 R hip pain at most for the past 7 days (06/28/2017)    Time  4    Period  Weeks    Status  On-going    Target Date  07/26/17      PT LONG TERM GOAL #2   Title  Pt will improve R hip abduction and extension strength by at least 1/2 MMT grade to promote ability to ambulate with less R hip pain.    Baseline  On eval: R hip E: 4+/5, R hip Abd 4-/5; On 05/10/17 R hip E: 4+/5, R hip Abd 4-/5 and on (05/29/17); 4+/5 R hip extension, 4/5 R hip abduction (06/28/2017)    Time  4    Period  Weeks    Status  Partially Met    Target Date  07/26/17      PT LONG TERM GOAL #3  Title  Pt will  improve her LEFS score by at least 9 points as a demonstration of improved function.     Baseline  37/80 (03/28/2017); 38/80 on 05/10/17; 51/80    Time  3    Period  Weeks    Status  Achieved      PT LONG TERM GOAL #4   Title  Pt will report being able to tolerate walking at least 10 minutes to promote mobility.     Baseline  Pt reports walking 5 minutes bothers her R hip 03/28/2017; can walk 10-15 (05/10/17)     Time  6    Period  Weeks    Status  Achieved      PT LONG TERM GOAL #5   Title  Pt will be able to don and doff socks and shoes with 4/10 pain or less for improved QOL    Baseline  7/10 pain; 3/10    Time  3    Period  Weeks    Status  Achieved            Plan - 07/19/17 1539    Clinical Impression Statement  Pt demonstrates good progress towards pain goals based on subjective reports. Continued working on R glute med and max strength and trunk strength to help decrease R hip pain and improve ability to perform functional tasks. Pt tolerated session well without aggravation of symptoms.     Rehab Potential  Fair    Clinical Impairments Affecting Rehab Potential  Chronicity of condition    PT Frequency  1x / week can go up to 2x/week if needed    PT Duration  4 weeks 3 weeks    PT Treatment/Interventions  Therapeutic activities;Therapeutic exercise;Neuromuscular re-education;Aquatic Therapy;Electrical Stimulation;Iontophoresis 54m/ml Dexamethasone;Ultrasound;Gait training;Stair training;Patient/family education;Manual techniques;Dry needling;Traction traction if appropriate    PT Next Visit Plan  Hip ROM, glute strengthening    PT Home Exercise Plan  Seated IR with band, sit<>stand, sidelying hip abd, seated adductor pillow sqeezes, lateral walking with GTB, long axis distraction using blue theraband    Consulted and Agree with Plan of Care  Patient       Patient will benefit from skilled therapeutic intervention in order to improve the following deficits and  impairments:  Pain, Improper body mechanics, Difficulty walking, Decreased strength  Visit Diagnosis: Pain in right hip  Difficulty in walking, not elsewhere classified     Problem List Patient Active Problem List   Diagnosis Date Noted  . Osteopenia of hip 09/04/2016  . History of bunionectomy of left great toe 07/15/2015  . H/O hammer toe correction 03/15/2015  . Arthritis of lumbar spine 01/19/2015  . Benign essential HTN 11/05/2014  . Carpal tunnel syndrome 11/05/2014  . Osteoarthritis 11/05/2014  . Dermatitis, eczematoid 11/05/2014  . Depression, major, recurrent, mild ([place] 11/05/2014  . Dyslipidemia 11/05/2014  . Gastro-esophageal reflux disease without esophagitis 11/05/2014  . Extreme obesity 11/05/2014  . Dysmetabolic syndrome 078/93/8101 . Menopausal and perimenopausal disorder 11/05/2014  . Perennial allergic rhinitis with seasonal variation 11/05/2014  . Seborrhea capitis 11/05/2014  . Acquired trigger finger 11/05/2014  . Urinary incontinence in female 11/05/2014  . Vitamin D deficiency 11/05/2014  . Blood glucose elevated 08/31/2009    MJoneen BoersPT, DPT   07/19/2017, 5:15 PM  [facility] [address] [place] [address]Phone: [phone]  Fax:  [phone] Name: LJAYLIANNA TATLOCKMRN: [id number] of Birth:  February 27, 1951

## 2017-07-19 NOTE — Patient Instructions (Signed)
Gave standing R hip abduction 10x3 with 5 seconds as part of her HEP. Pt demonstrated and verbalized understanding.

## 2017-07-23 ENCOUNTER — Encounter: Payer: Self-pay | Admitting: Family Medicine

## 2017-07-23 ENCOUNTER — Ambulatory Visit: Payer: Medicare Other | Admitting: Family Medicine

## 2017-07-23 VITALS — BP 130/72 | HR 77 | Temp 98.3°F | Resp 16 | Ht 62.0 in | Wt 201.3 lb

## 2017-07-23 DIAGNOSIS — T300 Burn of unspecified body region, unspecified degree: Secondary | ICD-10-CM

## 2017-07-23 DIAGNOSIS — I10 Essential (primary) hypertension: Secondary | ICD-10-CM | POA: Diagnosis not present

## 2017-07-23 DIAGNOSIS — E8881 Metabolic syndrome: Secondary | ICD-10-CM

## 2017-07-23 DIAGNOSIS — E785 Hyperlipidemia, unspecified: Secondary | ICD-10-CM | POA: Diagnosis not present

## 2017-07-23 DIAGNOSIS — M1611 Unilateral primary osteoarthritis, right hip: Secondary | ICD-10-CM

## 2017-07-23 DIAGNOSIS — Z23 Encounter for immunization: Secondary | ICD-10-CM

## 2017-07-23 DIAGNOSIS — F330 Major depressive disorder, recurrent, mild: Secondary | ICD-10-CM

## 2017-07-23 DIAGNOSIS — J302 Other seasonal allergic rhinitis: Secondary | ICD-10-CM

## 2017-07-23 DIAGNOSIS — Z9181 History of falling: Secondary | ICD-10-CM

## 2017-07-23 DIAGNOSIS — J3089 Other allergic rhinitis: Secondary | ICD-10-CM | POA: Diagnosis not present

## 2017-07-23 DIAGNOSIS — F411 Generalized anxiety disorder: Secondary | ICD-10-CM | POA: Diagnosis not present

## 2017-07-23 DIAGNOSIS — R739 Hyperglycemia, unspecified: Secondary | ICD-10-CM | POA: Diagnosis not present

## 2017-07-23 MED ORDER — ATORVASTATIN CALCIUM 40 MG PO TABS: 40.0000 mg | ORAL_TABLET | Freq: Every evening | ORAL | 1 refills | Status: DC

## 2017-07-23 MED ORDER — METFORMIN HCL 500 MG PO TABS: 500.0000 mg | ORAL_TABLET | Freq: Two times a day (BID) | ORAL | 1 refills | Status: DC

## 2017-07-23 MED ORDER — CITALOPRAM HYDROBROMIDE 20 MG PO TABS: 20.0000 mg | ORAL_TABLET | Freq: Every day | ORAL | 1 refills | Status: DC

## 2017-07-23 MED ORDER — FLUTICASONE PROPIONATE 50 MCG/ACT NA SUSP: 2.0000 | Freq: Every day | NASAL | 0 refills | Status: DC

## 2017-07-23 MED ORDER — SILVER SULFADIAZINE 1 % EX CREA: 1.0000 "application " | TOPICAL_CREAM | Freq: Every day | CUTANEOUS | 0 refills | Status: DC

## 2017-07-23 MED ORDER — OLMESARTAN-AMLODIPINE-HCTZ 20-5-12.5 MG PO TABS: 1.0000 | ORAL_TABLET | Freq: Every day | ORAL | 1 refills | Status: DC

## 2017-07-23 MED ORDER — LORATADINE 10 MG PO TABS: ORAL_TABLET | ORAL | 1 refills | Status: DC

## 2017-07-23 MED ORDER — NADOLOL 20 MG PO TABS: 20.0000 mg | ORAL_TABLET | Freq: Every evening | ORAL | 1 refills | Status: DC

## 2017-07-23 MED ORDER — BUPROPION HCL ER (XL) 150 MG PO TB24
150.0000 mg | ORAL_TABLET | Freq: Every day | ORAL | 1 refills | Status: DC
Start: 2017-07-23 — End: 2018-01-15

## 2017-07-23 NOTE — Progress Notes (Signed)
Name: Monique Baker   MRN: [id number]    DOB: 12-21-1950   Date:07/23/2017       Progress Note  Subjective  Chief Complaint  Chief Complaint  Patient presents with  . Hypertension  . Depression    HPI   Pre diabetes/metabolic syndrome: she is taking Metformin, denies side effects, hgbA1C was 5.9%, no polyphagia, polydipsia or polyuria. Also going to weight watchers, doing well with weight loss.   Right hip pain/OA: aggravated by activity, such as standing for a long time, walking helps with pain. She takes nsaid's occasionally, but advised Tylenol instead . seen by Dr. Hyacinth Meeker in the past. She is getting PT and states it helps with her pain. She fell asleep on an electric heating pad and burned her right buttocks  Extreme obesity:she had lost 17 lbs since she joined weight watchers 05/2016 and is doing well. Not drinking sweet beverages on a regular  basis, counting points, eating more fruit and vegetables, taking some protein shakes.She is looking forward to resume walking, currently doing PT for right hip  HTN: taking bp medication, denies side effects, no chest pain, no palpitation. She is complaint with medication. BP is at goal,she is compliant with medication    Depression Major: she is married to a Education officer, environmental, he also works as a Airline pilot at [employer]. She is only baby-sitting grandchildren prn and that is not as stressful now. We started her on Cymbalta 10/2015 and is feeling better no longer has anhedonia, motivation is back, no longer having crying spells, and not feeling so hungry, however has hot flashes and hair loss and is concerned about possible side effects, we switched to Celexa back in Fall 2018  and hot flashes resolved and hair is growing again. She is starting to feel down again, more tired than usual, discussed adding Wellbutrin  Recurrent Fall: fell Fall 2018 and again in 05/2017 , hit head on the pavement and had some bleeding, did not go to [facility], daughter  helped her out.    Patient Active Problem List   Diagnosis Date Noted  . Osteopenia of hip 09/04/2016  . History of bunionectomy of left great toe 07/15/2015  . H/O hammer toe correction 03/15/2015  . Arthritis of lumbar spine 01/19/2015  . Benign essential HTN 11/05/2014  . Carpal tunnel syndrome 11/05/2014  . Osteoarthritis 11/05/2014  . Dermatitis, eczematoid 11/05/2014  . Depression, major, recurrent, mild (HCC) 11/05/2014  . Dyslipidemia 11/05/2014  . Gastro-esophageal reflux disease without esophagitis 11/05/2014  . Morbid obesity due to excess calories (HCC) 11/05/2014  . Dysmetabolic syndrome 11/05/2014  . Menopausal and perimenopausal disorder 11/05/2014  . Perennial allergic rhinitis with seasonal variation 11/05/2014  . Seborrhea capitis 11/05/2014  . Acquired trigger finger 11/05/2014  . Urinary incontinence in female 11/05/2014  . Vitamin D deficiency 11/05/2014  . Blood glucose elevated 08/31/2009    Past Surgical History:  Procedure Laterality Date  . ABDOMINAL HYSTERECTOMY    . CESAREAN SECTION    . COLONOSCOPY WITH PROPOFOL N/A 03/15/2016   Procedure: COLONOSCOPY WITH PROPOFOL;  Surgeon: Kieth Brightly, MD;  Location: [facility] END[facility];  Service: Endoscopy;  Laterality: N/A;  . HALLUX VALGUS LAPIDUS Left 06/16/2015   Procedure: HALLUX VALGUS LAPIDUS;  Surgeon: Gwyneth Revels, DPM;  Location: [facility];  Service: Podiatry;  Laterality: Left;  WITH POPLITEAL  . HAMMER TOE SURGERY Left 06/16/2015   Procedure: HAMMER TOE CORRECTION SECOND TOE LEFT;  Surgeon: Gwyneth Revels, DPM;  Location: [facility];  Service: Podiatry;  Laterality: Left;  prediabetic - on oral meds  . MYOMECTOMY    . trigger finger Right 12/15/2014  . Vulva cyst  11/2006    Family History  Problem Relation Age of Onset  . Dementia Mother   . Diabetes Father   . Cancer Daughter        Fibroid Tumors  . Diabetes Brother   . Breast cancer Neg Hx     Social History    Socioeconomic History  . Marital status: Married    Spouse name: Not on file  . Number of children: 1  . Years of education: Not on file  . Highest education level: Not on file  Occupational History  . Occupation: retired Runner, broadcasting/film/video    Comment: Hospital doctor  . Financial resource strain: Not on file  . Food insecurity:    Worry: Not on file    Inability: Not on file  . Transportation needs:    Medical: Not on file    Non-medical: Not on file  Tobacco Use  . Smoking status: Never Smoker  . Smokeless tobacco: Never Used  Substance and Sexual Activity  . Alcohol use: No    Alcohol/week: 0.0 oz  . Drug use: No  . Sexual activity: Yes    Partners: Male  Lifestyle  . Physical activity:    Days per week: Not on file    Minutes per session: Not on file  . Stress: Not on file  Relationships  . Social connections:    Talks on phone: Not on file    Gets together: Not on file    Attends religious service: Not on file    Active member of club or organization: Not on file    Attends meetings of clubs or organizations: Not on file    Relationship status: Not on file  . Intimate partner violence:    Fear of current or ex partner: Not on file    Emotionally abused: Not on file    Physically abused: Not on file    Forced sexual activity: Not on file  Other Topics Concern  . Not on file  Social History Narrative   Married to a Education officer, environmental and he is a professor at [employer] Armed forces training and education officer )      Current Outpatient Medications:  .  acetaminophen (TYLENOL) 500 MG tablet, Take by mouth. Reported on 11/15/2015, Disp: , Rfl:  .  atorvastatin (LIPITOR) 40 MG tablet, Take 1 tablet (40 mg total) by mouth every evening. for cholesterol, Disp: 90 tablet, Rfl: 1 .  Cholecalciferol (VITAMIN D) 2000 units CAPS, Take 1 capsule by mouth 2 (two) times a week. , Disp: , Rfl:  .  citalopram (CELEXA) 20 MG tablet, Take 1 tablet (20 mg total) by mouth daily., Disp: 90 tablet, Rfl: 1 .   Cyanocobalamin (B-12) 1000 MCG SUBL, Place 1 each under the tongue once a week. , Disp: , Rfl:  .  fluticasone (FLONASE) 50 MCG/ACT nasal spray, Place 2 sprays into both nostrils daily., Disp: 48 g, Rfl: 0 .  glucose blood (ONETOUCH VERIO) test strip, Use as instructed, Disp: 100 each, Rfl: 12 .  loratadine (CLARITIN) 10 MG tablet, TAKE 1 TABLET (10 MG TOTAL) BY MOUTH DAILY., Disp: 90 tablet, Rfl: 1 .  metFORMIN (GLUCOPHAGE) 500 MG tablet, Take 1 tablet (500 mg total) by mouth 2 (two) times daily., Disp: 180 tablet, Rfl: 1 .  nadolol (CORGARD) 20 MG tablet, Take 1 tablet (20 mg  total) by mouth every evening., Disp: 90 tablet, Rfl: 1 .  Olmesartan-amLODIPine-HCTZ 20-5-12.5 MG TABS, Take 1 tablet by mouth daily., Disp: 90 tablet, Rfl: 1 .  triamcinolone cream (KENALOG) 0.1 %, Reported on 07/15/2015, Disp: , Rfl:  .  buPROPion (WELLBUTRIN XL) 150 MG 24 hr tablet, Take 1 tablet (150 mg total) by mouth daily before breakfast., Disp: 90 tablet, Rfl: 1 .  silver sulfADIAZINE (SILVADENE) 1 % cream, Apply 1 application topically daily., Disp: 50 g, Rfl: 0  Allergies  Allergen Reactions  . Duloxetine     sweat     ROS  Constitutional: Negative for fever , positive for  weight change.  Respiratory: Negative for cough and shortness of breath.   Cardiovascular: Negative for chest pain or palpitations.  Gastrointestinal: Negative for abdominal pain, no bowel changes.  Musculoskeletal: positive  for gait problem but no  joint swelling.  Skin: positive for skin burn from heating pad Neurological: Negative for dizziness or headache.  No other specific complaints in a complete review of systems (except as listed in HPI above).  Objective  Vitals:   07/23/17 0958  BP: 130/72  Pulse: 77  Resp: 16  Temp: 98.3 F (36.8 C)  TempSrc: Oral  SpO2: 97%  Weight: 201 lb 4.8 oz (91.3 kg)  Height: 5\' 2"  (1.575 m)    Body mass index is 36.82 kg/m.  Physical Exam  Constitutional: Patient appears  well-developed and well-nourished. Obese  No distress.  HEENT: head atraumatic, normocephalic, pupils equal and reactive to light,  neck supple, throat within normal limits Cardiovascular: Normal rate, regular rhythm and normal heart sounds.  No murmur heard. No BLE edema. Pulmonary/Chest: Effort normal and breath sounds normal. No respiratory distress. Abdominal: Soft.  There is no tenderness. Skin blister on right buttocks - from a heating pad Psychiatric: Patient has a normal mood and affect. behavior is normal. Judgment and thought content normal.  PHQ2/9: Depression screen [facility] 2/9 07/23/2017 03/19/2017 01/18/2017 11/14/2016 08/10/2016  Decreased Interest 0 0 0 0 0  Down, Depressed, Hopeless 3 0 0 0 0  PHQ - 2 Score 3 0 0 0 0  Altered sleeping 0 - - - -  Tired, decreased energy 2 - - - -  Change in appetite 0 - - - -  Feeling bad or failure about yourself  0 - - - -  Trouble concentrating 0 - - - -  Moving slowly or fidgety/restless 0 - - - -  Suicidal thoughts 0 - - - -  PHQ-9 Score 5 - - - -  Difficult doing work/chores Somewhat difficult - - - -    Fall Risk: Fall Risk  07/23/2017 07/23/2017 03/19/2017 01/18/2017 11/14/2016  Falls in the past year? - Yes Yes Yes No  Comment - - - - -  Number falls in past yr: - 2 or more 1 1 -  Injury with Fall? - Yes Yes Yes -  Comment - - - pt hit head -  Risk for fall due to : History of fall(s);Impaired balance/gait - - Other (Comment) -  Risk for fall due to: Comment - - - patient fell in tub at hotel while out of town  -  Follow up Education provided;Falls prevention discussed Education provided - Falls evaluation completed -     Functional Status Survey: Is the patient deaf or have difficulty hearing?: No Does the patient have difficulty seeing, even when wearing glasses/contacts?: No Does the patient have difficulty concentrating, remembering, or making  decisions?: No Does the patient have difficulty walking or climbing stairs?:  No Does the patient have difficulty dressing or bathing?: No Does the patient have difficulty doing errands alone such as visiting a doctor's office or shopping?: No   Assessment & Plan  1. Depression, major, recurrent, mild (HCC)  - citalopram (CELEXA) 20 MG tablet; Take 1 tablet (20 mg total) by mouth daily.  Dispense: 90 tablet; Refill: 1 We will add Wellbutrin XL 150 mg #90 and one refill  2. Primary osteoarthritis of right hip  Doing well with PT, and states improves for 4 days, but does not feel like she is ready to stop sessions yet  3. Morbid obesity due to excess calories (HCC)  Still at weight watchers, doing well, losing weight  4. Dyslipidemia  - atorvastatin (LIPITOR) 40 MG tablet; Take 1 tablet (40 mg total) by mouth every evening. for cholesterol  Dispense: 90 tablet; Refill: 1  5. Benign essential HTN  - nadolol (CORGARD) 20 MG tablet; Take 1 tablet (20 mg total) by mouth every evening.  Dispense: 90 tablet; Refill: 1 - Olmesartan-amLODIPine-HCTZ 20-5-12.5 MG TABS; Take 1 tablet by mouth daily.  Dispense: 90 tablet; Refill: 1  6. Dysmetabolic syndrome  - metFORMIN (GLUCOPHAGE) 500 MG tablet; Take 1 tablet (500 mg total) by mouth 2 (two) times daily.  Dispense: 180 tablet; Refill: 1  7. Hyperglycemia   8. Depression, major, recurrent, mild (HCC)  - citalopram (CELEXA) 20 MG tablet; Take 1 tablet (20 mg total) by mouth daily.  Dispense: 90 tablet; Refill: 1  9. GAD (generalized anxiety disorder)  - citalopram (CELEXA) 20 MG tablet; Take 1 tablet (20 mg total) by mouth daily.  Dispense: 90 tablet; Refill: 1  10. Perennial allergic rhinitis with seasonal variation  - fluticasone (FLONASE) 50 MCG/ACT nasal spray; Place 2 sprays into both nostrils daily.  Dispense: 48 g; Refill: 0 - loratadine (CLARITIN) 10 MG tablet; TAKE 1 TABLET (10 MG TOTAL) BY MOUTH DAILY.  Dispense: 90 tablet; Refill: 1  11. Superficial burn  - silver sulfADIAZINE (SILVADENE) 1 %  cream; Apply 1 application topically daily.  Dispense: 50 g; Refill: 0  12. Need for pneumococcal vaccination  - Pneumococcal conjugate vaccine 13-valent IM  13. Need for Tdap vaccination  - Tdap vaccine greater than or equal to 7yo IM  14. History of recent fall  She is currently on Pt for right hip OA, and we may need to extend for balance because of two falls in the past year

## 2017-07-24 ENCOUNTER — Ambulatory Visit: Payer: Medicare Other

## 2017-07-24 DIAGNOSIS — R262 Difficulty in walking, not elsewhere classified: Secondary | ICD-10-CM

## 2017-07-24 DIAGNOSIS — Z9181 History of falling: Secondary | ICD-10-CM

## 2017-07-24 DIAGNOSIS — M25551 Pain in right hip: Secondary | ICD-10-CM

## 2017-07-24 NOTE — Patient Instructions (Signed)
SLS with light touch assist to no UE assist 5x10 seconds each side for 2 sets daily as part of her HEP with something sturdy to hold onto for support and safety if needed. Pt demonstrated and verbalized understanding.

## 2017-07-24 NOTE — Therapy (Signed)
[place] [address], [place], [address] Phone: [phone]   Fax:  [phone]  Physical Therapy Treatment   Patient Details  Name: Monique Baker MRN: [id number] Date of Birth: May 19, 1950 Referring Provider: Steele Sizer, MD   Encounter Date: 07/24/2017  PT End of Session - 07/24/17 1035    Visit Number  22    Number of Visits  43    Date for PT Re-Evaluation  08/23/17    PT Start Time  4562    PT Stop Time  1121    PT Time Calculation (min)  46 min    Activity Tolerance  Patient tolerated treatment well    Behavior During Therapy  [facility] for tasks assessed/performed       Past Medical History:  Diagnosis Date  . Allergy   . Depression   . GERD (gastroesophageal reflux disease)   . Hyperlipidemia   . Hypertension   . Internal hemorrhoids   . Menopausal disorder   . Osteoarthritis    hands  . Prediabetes   . Wears dentures    partial upper    Past Surgical History:  Procedure Laterality Date  . ABDOMINAL HYSTERECTOMY    . CESAREAN SECTION    . COLONOSCOPY WITH PROPOFOL N/A 03/15/2016   Procedure: COLONOSCOPY WITH PROPOFOL;  Surgeon: Christene Lye, MD;  Location: [facility] ENDOSCOPY;  Service: Endoscopy;  Laterality: N/A;  . HALLUX VALGUS LAPIDUS Left 06/16/2015   Procedure: HALLUX VALGUS LAPIDUS;  Surgeon: Samara Deist, DPM;  Location: [place];  Service: Podiatry;  Laterality: Left;  WITH POPLITEAL  . HAMMER TOE SURGERY Left 06/16/2015   Procedure: HAMMER TOE CORRECTION SECOND TOE LEFT;  Surgeon: Samara Deist, DPM;  Location: [place];  Service: Podiatry;  Laterality: Left;  prediabetic - on oral meds  . MYOMECTOMY    . trigger finger Right 12/15/2014  . Vulva cyst  11/2006    There were no vitals filed for this visit.  Subjective Assessment - 07/24/17 1036    Subjective  R hip is fairly well. Has not had any major pain. Gets stiff when she gets out of bed.  Does her exercises before getting out of bed. 1/10 R hip currently. 2/10 R hip pain at most for the past 7 days. Its getting there. Sunday was pretty good. Did not have pain.  Pt states that MD told her that there is no problem with her R thigh. It's just bigger. Pt states that her MD yesterday told her that she wants to continue PT for balance secondary to hx of falls.  Pt states that her last fall was 06/17/2017.  Pt also states falling last year in a bathtub in [place] in September 2018. Pt was stepping out of the bathtub after taking a shower at the hotel and slipped and hit her head.  There were no grippers on the bathtub. Pt fell June 17, 2017. Pt was on her way to her oral surgery that morning.  Pt tripped on a side walk border as she was getting ready to go into her car for her dental appointment.  Pt tripped on her left foot and fell onto her L side. The bushes broke her fall.  No falls other than those 2.     Pertinent History  R hip osteoarthritis. A couple of years ago, pt would have soreness in her R posterior hip. Had x-rays and was told that she has a hairline  fracture which is healing itself. Pain stopped hurting afterwards. Pain however started from her R posterior hip to anterior hip at the joint area. Went to another doctor and was told that she has arthritis. Sometimes feels pain R medial thigh but not past her knee. Feels like her R leg would give way at times but does not fall. Her doctor does not think that she needs surgery for her R hip.  Did a lot of fixing up around her house yesterday and was on her hip a lot which bothered her hip.   Denies back pain.  Denies unexplained changes in weight.  Pain does not wake her up at night.   Pt assumes that her R hip hairline fracture is healed.   Pt husband is a Theme park manager and feels like she is in the "lime light." Wants to be able to walk better so people do not have to ask her if she is ok as much.     Patient Stated Goals  Walk straight  without limping.     Currently in Pain?  Yes    Pain Score  1     Pain Onset  More than a month ago         [facility] PT Assessment - 07/24/17 1048      AROM   Right Ankle Dorsiflexion  15    Left Ankle Dorsiflexion  14      Strength   Right Hip Extension  4+/5 seated manually resisted leg press    Right Hip ABduction  4/5    Left Hip Extension  4-/5 seated manually resisted leg press    Left Hip ABduction  4/5      Dynamic Gait Index   Level Surface  Mild Impairment    Change in Gait Speed  Mild Impairment    Gait with Horizontal Head Turns  Mild Impairment    Gait with Vertical Head Turns  Mild Impairment    Gait and Pivot Turn  Mild Impairment    Step Over Obstacle  Normal    Step Around Obstacles  Mild Impairment    Steps  Normal    Total Score  18    DGI comment:  < 19 suggests increased fall risk            No data recorded               PT Education - 07/24/17 1503    Education provided  Yes    Education Details  ther-ex, HEP, POC    Person(s) Educated  Patient    Methods  Explanation;Demonstration;Tactile cues;Verbal cues    Comprehension  Returned demonstration;Verbalized understanding        Objectives Pt states tripping on her L foot as she was getting into her car 06/17/2017 at time of her fall.   Therapeutic Exercise:   Supine ankle DF 1x each side  Directed patient with gait with normal gait speed, with changes in speed, 180 degree pivot turn, with R and L cervical rotation position, with cervical flexion and extension position, stepping around obstacles, stepping over an obstacle, ascending and descending 4 regular steps with UE assist  Some difficulty observed during SLS stance phases of motion  Pt states difficulty picking up her R foot compared to her L.   Reviewed plan of care 2x/week for 4 weeks to work on balance.   SLS with light touch assist to no UE assist 5x10 seconds each side  Manually resisted S/L  hip abduction,  seated leg press 1x each way for each LE  Reviewed progress/current status with hip strength with pt.    Improved exercise technique, movement at target joints, use of target muscles after min to mod verbal, visual, tactile cues.    Pt demonstrates overall decreased R hip pain levels, improved glute med strength, ability to ambulate longer distances and perform functional tasks more comfortably since initial evaluation. Pt wants to continue PT secondary to hx of 2 falls with the most recent fall being in February 2019, when pt tripped on an object at her sidewalk as she was trying to get into her car. Pt also demonstrates slight increase in fall risk based on her DGI score as well as slight unsteadiness during single limb stance phase of motion. Pt will benefit from continued skilled physical therapy services to continue decreasing R hip pain, improve hip strength, balance, and ability to perform functional tasks.              PT Long Term Goals - 07/24/17 1039      PT LONG TERM GOAL #1   Title  Pt will have a decrease in R hip pain to 2/10 or less at worst to promote ability to ambulate, negotiate stairs, pick up items from the floor.     Baseline  4/10 R hip pain at worst for the past 2 months (03/28/2017); 5/10 for the past 7 days (05/10/17) and (05/29/17); 5/10 R hip pain at most for the past 7 days (06/28/2017); 2/10 R hip pain at most for the past 7 days.     Time  4    Period  Weeks    Status  Achieved      PT LONG TERM GOAL #2   Title  Pt will improve R hip abduction and extension strength by at least 1/2 MMT grade to promote ability to ambulate with less R hip pain.    Baseline  On eval: R hip E: 4+/5, R hip Abd 4-/5; On 05/10/17 R hip E: 4+/5, R hip Abd 4-/5 and on (05/29/17); 4+/5 R hip extension, 4/5 R hip abduction (06/28/2017), (07/24/2017)    Time  4    Period  Weeks    Status  Partially Met    Target Date  08/23/17      PT LONG TERM GOAL #3   Title  Pt will improve her  LEFS score by at least 9 points as a demonstration of improved function.     Baseline  37/80 (03/28/2017); 38/80 on 05/10/17; 51/80    Time  3    Period  Weeks    Status  Achieved      PT LONG TERM GOAL #4   Title  Pt will report being able to tolerate walking at least 10 minutes to promote mobility.     Baseline  Pt reports walking 5 minutes bothers her R hip 03/28/2017; can walk 10-15 (05/10/17)     Time  6    Period  Weeks    Status  Achieved      PT LONG TERM GOAL #5   Title  Pt will be able to don and doff socks and shoes with 4/10 pain or less for improved QOL    Baseline  7/10 pain; 3/10    Time  3    Period  Weeks    Status  Achieved      Additional Long Term Goals   Additional Long Term Goals  Yes  PT LONG TERM GOAL #6   Title  Pt will improve her DGI score to at least 20/24 as a demonstration of decreased fall risk.     Baseline  18/24 (07/24/2017)    Time  4    Period  Weeks    Status  New    Target Date  08/23/17            Plan - 07/24/17 1504    Clinical Impression Statement  Pt demonstrates overall decreased R hip pain levels, improved glute med strength, ability to ambulate longer distances and perform functional tasks more comfortably since initial evaluation. Pt wants to continue PT secondary to hx of 2 falls with the most recent fall being in February 2019, when pt tripped on an object at her sidewalk as she was trying to get into her car. Pt also demonstrates slight increase in fall risk based on her DGI score as well as slight unsteadiness during single limb stance phase of motion. Pt will benefit from continued skilled physical therapy services to continue decreasing R hip pain, improve hip strength, balance, and ability to perform functional tasks.     History and Personal Factors relevant to plan of care:  Chronicity of condition    Clinical Presentation  Stable    Clinical Presentation due to:  Decreased R hip pain, improved function    Clinical  Decision Making  Low    Rehab Potential  Fair    Clinical Impairments Affecting Rehab Potential  Chronicity of condition    PT Frequency  2x / week    PT Duration  4 weeks    PT Treatment/Interventions  Therapeutic activities;Therapeutic exercise;Neuromuscular re-education;Aquatic Therapy;Electrical Stimulation;Iontophoresis 23m/ml Dexamethasone;Ultrasound;Gait training;Stair training;Patient/family education;Manual techniques;Dry needling;Traction traction if appropriate    PT Next Visit Plan  Balance related activities, hip ROM, glute strengthening    Consulted and Agree with Plan of Care  Patient       Patient will benefit from skilled therapeutic intervention in order to improve the following deficits and impairments:  Pain, Improper body mechanics, Difficulty walking, Decreased strength  Visit Diagnosis: History of falling - Plan: PT plan of care cert/re-cert  Pain in right hip - Plan: PT plan of care cert/re-cert  Difficulty in walking, not elsewhere classified - Plan: PT plan of care cert/re-cert     Problem List Patient Active Problem List   Diagnosis Date Noted  . Osteopenia of hip 09/04/2016  . History of bunionectomy of left great toe 07/15/2015  . H/O hammer toe correction 03/15/2015  . Arthritis of lumbar spine 01/19/2015  . Benign essential HTN 11/05/2014  . Carpal tunnel syndrome 11/05/2014  . Osteoarthritis 11/05/2014  . Dermatitis, eczematoid 11/05/2014  . Depression, major, recurrent, mild ([place] 11/05/2014  . Dyslipidemia 11/05/2014  . Gastro-esophageal reflux disease without esophagitis 11/05/2014  . Morbid obesity due to excess calories ([place] 11/05/2014  . Dysmetabolic syndrome 098/33/8250 . Menopausal and perimenopausal disorder 11/05/2014  . Perennial allergic rhinitis with seasonal variation 11/05/2014  . Seborrhea capitis 11/05/2014  . Acquired trigger finger 11/05/2014  . Urinary incontinence in female 11/05/2014  . Vitamin D deficiency 11/05/2014   . Blood glucose elevated 08/31/2009    MJoneen BoersPT, DPT   07/24/2017, 3:18 PM  [facility] [address] [place] [address]Phone: [phone]  Fax:  [phone] Name: LSAMUEL RITTENHOUSEMRN: [id number] of Birth: 1November 02, 1952

## 2017-07-26 ENCOUNTER — Ambulatory Visit: Payer: Medicare Other

## 2017-07-26 DIAGNOSIS — Z9181 History of falling: Secondary | ICD-10-CM

## 2017-07-26 DIAGNOSIS — M25551 Pain in right hip: Secondary | ICD-10-CM | POA: Diagnosis not present

## 2017-07-26 DIAGNOSIS — R262 Difficulty in walking, not elsewhere classified: Secondary | ICD-10-CM

## 2017-07-26 NOTE — Therapy (Signed)
[facility] [address] [place], [address] Phone: [phone]   Fax:  [phone]  Physical Therapy Treatment  Patient Details  Name: Monique Baker MRN: [id number] Date of Birth: [birth year] Referring Provider: Steele Sizer, MD   Encounter Date: 07/26/2017  PT End of Session - 07/26/17 1522    Visit Number  23    Number of Visits  43    Date for PT Re-Evaluation  08/23/17    PT Start Time  1522    PT Stop Time  7035    PT Time Calculation (min)  42 min    Activity Tolerance  Patient tolerated treatment well    Behavior During Therapy  [facility] for tasks assessed/performed       Past Medical History:  Diagnosis Date  . Allergy   . Depression   . GERD (gastroesophageal reflux disease)   . Hyperlipidemia   . Hypertension   . Internal hemorrhoids   . Menopausal disorder   . Osteoarthritis    hands  . Prediabetes   . Wears dentures    partial upper    Past Surgical History:  Procedure Laterality Date  . ABDOMINAL HYSTERECTOMY    . CESAREAN SECTION    . COLONOSCOPY WITH PROPOFOL N/A 03/15/2016   Procedure: COLONOSCOPY WITH PROPOFOL;  Surgeon: Christene Lye, MD;  Location: [facility]OS[facility];  Service: Endoscopy;  Laterality: N/A;  . HALLUX VALGUS LAPIDUS Left 06/16/2015   Procedure: HALLUX VALGUS LAPIDUS;  Surgeon: Samara Deist, DPM;  Location: [place];  Service: Podiatry;  Laterality: Left;  WITH POPLITEAL  . HAMMER TOE SURGERY Left 06/16/2015   Procedure: HAMMER TOE CORRECTION SECOND TOE LEFT;  Surgeon: Samara Deist, DPM;  Location: [place];  Service: Podiatry;  Laterality: Left;  prediabetic - on oral meds  . MYOMECTOMY    . trigger finger Right 12/15/2014  . Vulva cyst  11/2006    There were no vitals filed for this visit.  Subjective Assessment - 07/26/17 1524    Subjective  Balance feels about the same. R hip feels like a 3/10 right now. Did her exercises at  home which helped.     Pertinent History  R hip osteoarthritis. A couple of years ago, pt would have soreness in her R posterior hip. Had x-rays and was told that she has a hairline fracture which is healing itself. Pain stopped hurting afterwards. Pain however started from her R posterior hip to anterior hip at the joint area. Went to another doctor and was told that she has arthritis. Sometimes feels pain R medial thigh but not past her knee. Feels like her R leg would give way at times but does not fall. Her doctor does not think that she needs surgery for her R hip.  Did a lot of fixing up around her house yesterday and was on her hip a lot which bothered her hip.   Denies back pain.  Denies unexplained changes in weight.  Pain does not wake her up at night.   Pt assumes that her R hip hairline fracture is healed.   Pt husband is a Theme park manager and feels like she is in the "lime light." Wants to be able to walk better so people do not have to ask her if she is ok as much.     Patient Stated Goals  Walk straight without limping.     Currently in Pain?  Yes    Pain  Score  3     Pain Onset  More than a month ago                No data recorded               PT Education - 07/26/17 1540    Education provided  Yes    Education Details  ther-ex    [employer]) Educated  Patient    Methods  Explanation;Demonstration;Tactile cues;Verbal cues    Comprehension  Returned demonstration;Verbalized understanding         Objectives  Healing burn in R hip  Therapeutic Exercise:    Supine muscle energytechniqueto promote R hip IR secondary to stiffness, R hip in slight abduction and about 70 degrees hip flexion.   Supine R hip IR at 90/90 hip position 10x3   0/10 R hip pain after aforementioned exercises  Standing alternating toe taps onto stair step 10x3 with emphasis on foot clearance  Standing gastroc stretch at stair step with bilateral UE assist 30 seconds x  2  Standing heel toe raises to promote foot clearance (ankle DF) 10x3 each way  Give as part of her HEP next visit if appropriate  SLS 5x10 seconds each LE  Stepping over 8 inch cones 10x each LE   Forward step onto and over Air Ex pad without UE assist 10x each LE  SLS with opposite toe touch on balance stones 5x each LE     Improved exercise technique, movement at target joints, use of target muscles after min to mod verbal, visual, tactile cues.    Decreased R hip pain with exercises to help decrease R piriformis muslce tension. Continued working on balance activities to promtoe foot clearance and decrease fall risk.       PT Long Term Goals - 07/24/17 1039      PT LONG TERM GOAL #1   Title  Pt will have a decrease in R hip pain to 2/10 or less at worst to promote ability to ambulate, negotiate stairs, pick up items from the floor.     Baseline  4/10 R hip pain at worst for the past 2 months (03/28/2017); 5/10 for the past 7 days (05/10/17) and (05/29/17); 5/10 R hip pain at most for the past 7 days (06/28/2017); 2/10 R hip pain at most for the past 7 days.     Time  4    Period  Weeks    Status  Achieved      PT LONG TERM GOAL #2   Title  Pt will improve R hip abduction and extension strength by at least 1/2 MMT grade to promote ability to ambulate with less R hip pain.    Baseline  On eval: R hip E: 4+/5, R hip Abd 4-/5; On 05/10/17 R hip E: 4+/5, R hip Abd 4-/5 and on (05/29/17); 4+/5 R hip extension, 4/5 R hip abduction (06/28/2017), (07/24/2017)    Time  4    Period  Weeks    Status  Partially Met    Target Date  08/23/17      PT LONG TERM GOAL #3   Title  Pt will improve her LEFS score by at least 9 points as a demonstration of improved function.     Baseline  37/80 (03/28/2017); 38/80 on 05/10/17; 51/80    Time  3    Period  Weeks    Status  Achieved      PT LONG TERM GOAL #4  Title  Pt will report being able to tolerate walking at least 10 minutes to promote  mobility.     Baseline  Pt reports walking 5 minutes bothers her R hip 03/28/2017; can walk 10-15 (05/10/17)     Time  6    Period  Weeks    Status  Achieved      PT LONG TERM GOAL #5   Title  Pt will be able to don and doff socks and shoes with 4/10 pain or less for improved QOL    Baseline  7/10 pain; 3/10    Time  3    Period  Weeks    Status  Achieved      Additional Long Term Goals   Additional Long Term Goals  Yes      PT LONG TERM GOAL #6   Title  Pt will improve her DGI score to at least 20/24 as a demonstration of decreased fall risk.     Baseline  18/24 (07/24/2017)    Time  4    Period  Weeks    Status  New    Target Date  08/23/17            Plan - 07/26/17 1547    Clinical Impression Statement  Decreased R hip pain with exercises to help decrease R piriformis muslce tension. Continued working on balance activities to promtoe foot clearance and decrease fall risk.    Rehab Potential  Fair    Clinical Impairments Affecting Rehab Potential  Chronicity of condition    PT Frequency  2x / week    PT Duration  4 weeks    PT Treatment/Interventions  Therapeutic activities;Therapeutic exercise;Neuromuscular re-education;Aquatic Therapy;Electrical Stimulation;Iontophoresis 46m/ml Dexamethasone;Ultrasound;Gait training;Stair training;Patient/family education;Manual techniques;Dry needling;Traction traction if appropriate    PT Next Visit Plan  Balance related activities, hip ROM, glute strengthening    Consulted and Agree with Plan of Care  Patient       Patient will benefit from skilled therapeutic intervention in order to improve the following deficits and impairments:  Pain, Improper body mechanics, Difficulty walking, Decreased strength  Visit Diagnosis: Pain in right hip  Difficulty in walking, not elsewhere classified  History of falling     Problem List Patient Active Problem List   Diagnosis Date Noted  . Osteopenia of hip 09/04/2016  . History of  bunionectomy of left great toe 07/15/2015  . H/O hammer toe correction 03/15/2015  . Arthritis of lumbar spine 01/19/2015  . Benign essential HTN 11/05/2014  . Carpal tunnel syndrome 11/05/2014  . Osteoarthritis 11/05/2014  . Dermatitis, eczematoid 11/05/2014  . Depression, major, recurrent, mild ([place] 11/05/2014  . Dyslipidemia 11/05/2014  . Gastro-esophageal reflux disease without esophagitis 11/05/2014  . Morbid obesity due to excess calories ([place] 11/05/2014  . Dysmetabolic syndrome 084/13/2440 . Menopausal and perimenopausal disorder 11/05/2014  . Perennial allergic rhinitis with seasonal variation 11/05/2014  . Seborrhea capitis 11/05/2014  . Acquired trigger finger 11/05/2014  . Urinary incontinence in female 11/05/2014  . Vitamin D deficiency 11/05/2014  . Blood glucose elevated 08/31/2009    MJoneen BoersPT, DPT   07/26/2017, 6:15 PM  [facility] [facility] [address] [place] [address]Phone: [phone]  Fax:  [phone] Name: LCAMBREY LUPIMRN: [id number] of Birth: 101/04/1951

## 2017-07-30 ENCOUNTER — Ambulatory Visit: Payer: Medicare Other | Attending: Family Medicine

## 2017-07-30 DIAGNOSIS — M25551 Pain in right hip: Secondary | ICD-10-CM | POA: Diagnosis not present

## 2017-07-30 DIAGNOSIS — Z9181 History of falling: Secondary | ICD-10-CM

## 2017-07-30 DIAGNOSIS — R262 Difficulty in walking, not elsewhere classified: Secondary | ICD-10-CM

## 2017-07-30 NOTE — Patient Instructions (Signed)
Toe / Heel Raise    Holding onto something sturdy for support,  gently rock back on heels and raise toes. Then rock forward on toes and raise heels. Repeat sequence __10__ times per session. Perform 3 sets daily.  Copyright  VHI. All rights reserved.

## 2017-07-30 NOTE — Therapy (Signed)
[facility] [address], [place], [address] Phone: [phone]   Fax:  [phone]  Physical Therapy Treatment  Patient Details  Name: Monique Baker MRN: [id number] Date of Birth: 01-10-1951 Referring Provider: Steele Sizer, MD   Encounter Date: 07/30/2017  PT End of Session - 07/30/17 1124    Visit Number  24    Number of Visits  43    Date for PT Re-Evaluation  08/23/17    PT Start Time  9024    PT Stop Time  1207    PT Time Calculation (min)  43 min    Activity Tolerance  Patient tolerated treatment well    Behavior During Therapy  [facility] for tasks assessed/performed       Past Medical History:  Diagnosis Date  . Allergy   . Depression   . GERD (gastroesophageal reflux disease)   . Hyperlipidemia   . Hypertension   . Internal hemorrhoids   . Menopausal disorder   . Osteoarthritis    hands  . Prediabetes   . Wears dentures    partial upper    Past Surgical History:  Procedure Laterality Date  . ABDOMINAL HYSTERECTOMY    . CESAREAN SECTION    . COLONOSCOPY WITH PROPOFOL N/A 03/15/2016   Procedure: COLONOSCOPY WITH PROPOFOL;  Surgeon: Christene Lye, MD;  Location: [facility];  Service: Endoscopy;  Laterality: N/A;  . HALLUX VALGUS LAPIDUS Left 06/16/2015   Procedure: HALLUX VALGUS LAPIDUS;  Surgeon: Samara Deist, DPM;  Location: [place];  Service: Podiatry;  Laterality: Left;  WITH POPLITEAL  . HAMMER TOE SURGERY Left 06/16/2015   Procedure: HAMMER TOE CORRECTION SECOND TOE LEFT;  Surgeon: Samara Deist, DPM;  Location: [place];  Service: Podiatry;  Laterality: Left;  prediabetic - on oral meds  . MYOMECTOMY    . trigger finger Right 12/15/2014  . Vulva cyst  11/2006    There were no vitals filed for this visit.  Subjective Assessment - 07/30/17 1124    Subjective  R hip is fairly well. About a 2/10 (R posterior lateral to lateral hip) when walking  currently. Did fine yesterday until yesterday afternoon around 6 pm for some reason. Was fine Saturday.  The seated ball squeezes help.     Pertinent History  R hip osteoarthritis. A couple of years ago, pt would have soreness in her R posterior hip. Had x-rays and was told that she has a hairline fracture which is healing itself. Pain stopped hurting afterwards. Pain however started from her R posterior hip to anterior hip at the joint area. Went to another doctor and was told that she has arthritis. Sometimes feels pain R medial thigh but not past her knee. Feels like her R leg would give way at times but does not fall. Her doctor does not think that she needs surgery for her R hip.  Did a lot of fixing up around her house yesterday and was on her hip a lot which bothered her hip.   Denies back pain.  Denies unexplained changes in weight.  Pain does not wake her up at night.   Pt assumes that her R hip hairline fracture is healed.   Pt husband is a Theme park manager and feels like she is in the "lime light." Wants to be able to walk better so people do not have to ask her if she is ok as much.     Patient Stated Goals  Walk straight without limping.     Currently in Pain?  Yes    Pain Score  2  when walking    Pain Onset  More than a month ago                               PT Education - 07/30/17 1128    Education provided  Yes    Education Details  ther-ex, HEP    Person(s) Educated  Patient    Methods  Explanation;Demonstration;Tactile cues;Verbal cues;Handout    Comprehension  Returned demonstration;Verbalized understanding         Objectives  Healing burn in R hip  Therapeutic Exercise:   Seated R hip extension isometrics 10x10 second holds  Standing R LE leg press resisting blue band 10x3  Standing heel toe raises to promote foot clearance (ankle DF) 10x3 each way  Ladder drills slow  Forward in and out 2x  Lateral in and out 2x   Four square step exercise    Then stepping over 8 inch cones 6x. Difficulty picking up R foot after stepping back with L LE secondary to weakness and discomfort.    Supine muscle energytechniqueto promote R hip IR secondary to stiffness, R hip in slight abduction and about 70 degrees hip flexion.   Supine R hip IR at 90/90 hip position 10x3    decreased R hip pinching sensation  Improved exercise technique, movement at target joints, use of target muscles after min to mod verbal, visual, tactile cues.   Decreased R hip pain following exercises to promote glute max strength and increasing hip IR. Continued working on balance activities to help decrease fall risk. Difficulty with stepping over 8 inch cones when stepping back with L LE secondary R anterior hip discomfort when picking it up to meet her L foot. Pt tolerated session well without aggravation of symptoms.             PT Long Term Goals - 07/24/17 1039      PT LONG TERM GOAL #1   Title  Pt will have a decrease in R hip pain to 2/10 or less at worst to promote ability to ambulate, negotiate stairs, pick up items from the floor.     Baseline  4/10 R hip pain at worst for the past 2 months (03/28/2017); 5/10 for the past 7 days (05/10/17) and (05/29/17); 5/10 R hip pain at most for the past 7 days (06/28/2017); 2/10 R hip pain at most for the past 7 days.     Time  4    Period  Weeks    Status  Achieved      PT LONG TERM GOAL #2   Title  Pt will improve R hip abduction and extension strength by at least 1/2 MMT grade to promote ability to ambulate with less R hip pain.    Baseline  On eval: R hip E: 4+/5, R hip Abd 4-/5; On 05/10/17 R hip E: 4+/5, R hip Abd 4-/5 and on (05/29/17); 4+/5 R hip extension, 4/5 R hip abduction (06/28/2017), (07/24/2017)    Time  4    Period  Weeks    Status  Partially Met    Target Date  08/23/17      PT LONG TERM GOAL #3   Title  Pt will improve her LEFS score by at least 9 points as a demonstration of improved  function.  Baseline  37/80 (03/28/2017); 38/80 on 05/10/17; 51/80    Time  3    Period  Weeks    Status  Achieved      PT LONG TERM GOAL #4   Title  Pt will report being able to tolerate walking at least 10 minutes to promote mobility.     Baseline  Pt reports walking 5 minutes bothers her R hip 03/28/2017; can walk 10-15 (05/10/17)     Time  6    Period  Weeks    Status  Achieved      PT LONG TERM GOAL #5   Title  Pt will be able to don and doff socks and shoes with 4/10 pain or less for improved QOL    Baseline  7/10 pain; 3/10    Time  3    Period  Weeks    Status  Achieved      Additional Long Term Goals   Additional Long Term Goals  Yes      PT LONG TERM GOAL #6   Title  Pt will improve her DGI score to at least 20/24 as a demonstration of decreased fall risk.     Baseline  18/24 (07/24/2017)    Time  4    Period  Weeks    Status  New    Target Date  08/23/17            Plan - 07/30/17 1135    Clinical Impression Statement  Decreased R hip pain following exercises to promote glute max strength and increasing hip IR. Continued working on balance activities to help decrease fall risk. Difficulty with stepping over 8 inch cones when stepping back with L LE secondary R anterior hip discomfort when picking it up to meet her L foot. Pt tolerated session well without aggravation of symptoms.      Rehab Potential  Fair    Clinical Impairments Affecting Rehab Potential  Chronicity of condition    PT Frequency  2x / week    PT Duration  4 weeks    PT Treatment/Interventions  Therapeutic activities;Therapeutic exercise;Neuromuscular re-education;Aquatic Therapy;Electrical Stimulation;Iontophoresis 38m/ml Dexamethasone;Ultrasound;Gait training;Stair training;Patient/family education;Manual techniques;Dry needling;Traction traction if appropriate    PT Next Visit Plan  Balance related activities, hip ROM, glute strengthening    Consulted and Agree with Plan of Care  Patient        Patient will benefit from skilled therapeutic intervention in order to improve the following deficits and impairments:  Pain, Improper body mechanics, Difficulty walking, Decreased strength  Visit Diagnosis: Pain in right hip  Difficulty in walking, not elsewhere classified  History of falling     Problem List Patient Active Problem List   Diagnosis Date Noted  . Osteopenia of hip 09/04/2016  . History of bunionectomy of left great toe 07/15/2015  . H/O hammer toe correction 03/15/2015  . Arthritis of lumbar spine 01/19/2015  . Benign essential HTN 11/05/2014  . Carpal tunnel syndrome 11/05/2014  . Osteoarthritis 11/05/2014  . Dermatitis, eczematoid 11/05/2014  . Depression, major, recurrent, mild ([place] 11/05/2014  . Dyslipidemia 11/05/2014  . Gastro-esophageal reflux disease without esophagitis 11/05/2014  . Morbid obesity due to excess calories ([facility] 11/05/2014  . Dysmetabolic syndrome 082/50/5397 . Menopausal and perimenopausal disorder 11/05/2014  . Perennial allergic rhinitis with seasonal variation 11/05/2014  . Seborrhea capitis 11/05/2014  . Acquired trigger finger 11/05/2014  . Urinary incontinence in female 11/05/2014  . Vitamin D deficiency 11/05/2014  . Blood glucose elevated 08/31/2009  Joneen Boers PT, DPT   07/30/2017, 1:11 PM  [facility] [address], [place], [address] Phone: [phone]   Fax:  [phone]  Name: Monique Baker MRN: [id number] Date of Birth: 1951/04/25

## 2017-08-02 ENCOUNTER — Ambulatory Visit: Payer: Medicare Other

## 2017-08-02 DIAGNOSIS — M25551 Pain in right hip: Secondary | ICD-10-CM | POA: Diagnosis not present

## 2017-08-02 DIAGNOSIS — Z9181 History of falling: Secondary | ICD-10-CM

## 2017-08-02 DIAGNOSIS — R262 Difficulty in walking, not elsewhere classified: Secondary | ICD-10-CM

## 2017-08-02 NOTE — Patient Instructions (Signed)
  Reverse clamshell  Lie on your left side, knees bend with pillow in between.   Lift your right foot up, keeping your knee on your pillow.    Repeat 10 times.    Perform 3 sets daily.

## 2017-08-02 NOTE — Therapy (Signed)
[facility] [address], [place], [address] Phone: [phone]   Fax:  [phone]  Physical Therapy Treatment  Patient Details  Name: Monique Baker MRN: [id number] Date of Birth: [birth year] Referring Provider: Steele Sizer, MD   Encounter Date: 08/02/2017  PT End of Session - 08/02/17 1301    Visit Number  25    Number of Visits  43    Date for PT Re-Evaluation  08/23/17    PT Start Time  1301    PT Stop Time  1343    PT Time Calculation (min)  42 min    Activity Tolerance  Patient tolerated treatment well    Behavior During Therapy  [facility] for tasks assessed/performed       Past Medical History:  Diagnosis Date  . Allergy   . Depression   . GERD (gastroesophageal reflux disease)   . Hyperlipidemia   . Hypertension   . Internal hemorrhoids   . Menopausal disorder   . Osteoarthritis    hands  . Prediabetes   . Wears dentures    partial upper    Past Surgical History:  Procedure Laterality Date  . ABDOMINAL HYSTERECTOMY    . CESAREAN SECTION    . COLONOSCOPY WITH PROPOFOL N/A 03/15/2016   Procedure: COLONOSCOPY WITH PROPOFOL;  Surgeon: Christene Lye, MD;  Location: [facility];  Service: Endoscopy;  Laterality: N/A;  . HALLUX VALGUS LAPIDUS Left 06/16/2015   Procedure: HALLUX VALGUS LAPIDUS;  Surgeon: Samara Deist, DPM;  Location: [place];  Service: Podiatry;  Laterality: Left;  WITH POPLITEAL  . HAMMER TOE SURGERY Left 06/16/2015   Procedure: HAMMER TOE CORRECTION SECOND TOE LEFT;  Surgeon: Samara Deist, DPM;  Location: [place];  Service: Podiatry;  Laterality: Left;  prediabetic - on oral meds  . MYOMECTOMY    . trigger finger Right 12/15/2014  . Vulva cyst  11/2006    There were no vitals filed for this visit.  Subjective Assessment - 08/02/17 1303    Subjective  R hip is fairly well today. 1/10 pain currently. Able to walk and do stuff at the house.  Had a lot of pain Monday evening. Tuesday and Wendesday were good.     Pertinent History  R hip osteoarthritis. A couple of years ago, pt would have soreness in her R posterior hip. Had x-rays and was told that she has a hairline fracture which is healing itself. Pain stopped hurting afterwards. Pain however started from her R posterior hip to anterior hip at the joint area. Went to another doctor and was told that she has arthritis. Sometimes feels pain R medial thigh but not past her knee. Feels like her R leg would give way at times but does not fall. Her doctor does not think that she needs surgery for her R hip.  Did a lot of fixing up around her house yesterday and was on her hip a lot which bothered her hip.   Denies back pain.  Denies unexplained changes in weight.  Pain does not wake her up at night.   Pt assumes that her R hip hairline fracture is healed.   Pt husband is a Theme park manager and feels like she is in the "lime light." Wants to be able to walk better so people do not have to ask her if she is ok as much.     Patient Stated Goals  Walk straight without limping.  Currently in Pain?  Yes    Pain Score  1     Pain Onset  More than a month ago                               PT Education - 08/02/17 1332    Education provided  Yes    Education Details  ther-ex, HEP    Person(s) Educated  Patient    Methods  Explanation;Demonstration;Tactile cues;Verbal cues;Handout    Comprehension  Returned demonstration;Verbalized understanding         Objectives  Pt states the burn in her R hip is healed up.   Manual therapy   Supine inferior lateral, lateral glide R hip (hip in hooklying position) with use of gait belt grade 3+ to promote joint mobility  no anterior R hip pain afterwards in sitting    Therapeutic Exercise:   Supine R hip IR stretch with PT x 2. Increased time to help faciliate stretch  Supine R hip IR at 90/90 hip position 10x3  L S/L R hip  reverse clamshells 10x3  Reviewed and given as part of her HEP. Pt demonstrated and verbalized understanding.   Standing R glute max extension resisting blue band 10x3  Standing R LE leg press resisting blue band 10x3   Improved exercise technique, movement at target joints, use of target muscles after min to mod verbal, visual, tactile cues.    Decreased R anterior hip pinching/pain following treatement to promote joint mobility and hip IR ROM. Continued working on glute strengthening to help decrease piriformis tension.                PT Long Term Goals - 07/24/17 1039      PT LONG TERM GOAL #1   Title  Pt will have a decrease in R hip pain to 2/10 or less at worst to promote ability to ambulate, negotiate stairs, pick up items from the floor.     Baseline  4/10 R hip pain at worst for the past 2 months (03/28/2017); 5/10 for the past 7 days (05/10/17) and (05/29/17); 5/10 R hip pain at most for the past 7 days (06/28/2017); 2/10 R hip pain at most for the past 7 days.     Time  4    Period  Weeks    Status  Achieved      PT LONG TERM GOAL #2   Title  Pt will improve R hip abduction and extension strength by at least 1/2 MMT grade to promote ability to ambulate with less R hip pain.    Baseline  On eval: R hip E: 4+/5, R hip Abd 4-/5; On 05/10/17 R hip E: 4+/5, R hip Abd 4-/5 and on (05/29/17); 4+/5 R hip extension, 4/5 R hip abduction (06/28/2017), (07/24/2017)    Time  4    Period  Weeks    Status  Partially Met    Target Date  08/23/17      PT LONG TERM GOAL #3   Title  Pt will improve her LEFS score by at least 9 points as a demonstration of improved function.     Baseline  37/80 (03/28/2017); 38/80 on 05/10/17; 51/80    Time  3    Period  Weeks    Status  Achieved      PT LONG TERM GOAL #4   Title  Pt will report being able to tolerate walking at  least 10 minutes to promote mobility.     Baseline  Pt reports walking 5 minutes bothers her R hip 03/28/2017; can walk  10-15 (05/10/17)     Time  6    Period  Weeks    Status  Achieved      PT LONG TERM GOAL #5   Title  Pt will be able to don and doff socks and shoes with 4/10 pain or less for improved QOL    Baseline  7/10 pain; 3/10    Time  3    Period  Weeks    Status  Achieved      Additional Long Term Goals   Additional Long Term Goals  Yes      PT LONG TERM GOAL #6   Title  Pt will improve her DGI score to at least 20/24 as a demonstration of decreased fall risk.     Baseline  18/24 (07/24/2017)    Time  4    Period  Weeks    Status  New    Target Date  08/23/17            Plan - 08/02/17 1259    Clinical Impression Statement  Decreased R anterior hip pinching/pain following treatement to promote joint mobility and hip IR ROM. Continued working on glute strengthening to help decrease piriformis tension.     Rehab Potential  Fair    Clinical Impairments Affecting Rehab Potential  Chronicity of condition    PT Frequency  2x / week    PT Duration  4 weeks    PT Treatment/Interventions  Therapeutic activities;Therapeutic exercise;Neuromuscular re-education;Aquatic Therapy;Electrical Stimulation;Iontophoresis 75m/ml Dexamethasone;Ultrasound;Gait training;Stair training;Patient/family education;Manual techniques;Dry needling;Traction traction if appropriate    PT Next Visit Plan  Balance related activities, hip ROM, glute strengthening    Consulted and Agree with Plan of Care  Patient       Patient will benefit from skilled therapeutic intervention in order to improve the following deficits and impairments:  Pain, Improper body mechanics, Difficulty walking, Decreased strength  Visit Diagnosis: Pain in right hip  Difficulty in walking, not elsewhere classified  History of falling     Problem List Patient Active Problem List   Diagnosis Date Noted  . Osteopenia of hip 09/04/2016  . History of bunionectomy of left great toe 07/15/2015  . H/O hammer toe correction 03/15/2015  .  Arthritis of lumbar spine 01/19/2015  . Benign essential HTN 11/05/2014  . Carpal tunnel syndrome 11/05/2014  . Osteoarthritis 11/05/2014  . Dermatitis, eczematoid 11/05/2014  . Depression, major, recurrent, mild ([facility] 11/05/2014  . Dyslipidemia 11/05/2014  . Gastro-esophageal reflux disease without esophagitis 11/05/2014  . Morbid obesity due to excess calories ([facility] 11/05/2014  . Dysmetabolic syndrome 016/01/9603 . Menopausal and perimenopausal disorder 11/05/2014  . Perennial allergic rhinitis with seasonal variation 11/05/2014  . Seborrhea capitis 11/05/2014  . Acquired trigger finger 11/05/2014  . Urinary incontinence in female 11/05/2014  . Vitamin D deficiency 11/05/2014  . Blood glucose elevated 08/31/2009   MJoneen BoersPT, DPT   08/02/2017, 8:21 PM   [facility] [address] [place] [address]Phone: [phone]  Fax:  [phone] Name: Monique HEFNERMRN: [id number] of Birth: 1February 13, 1952

## 2017-08-06 ENCOUNTER — Ambulatory Visit: Payer: Medicare Other

## 2017-08-08 ENCOUNTER — Ambulatory Visit: Payer: Medicare Other

## 2017-08-08 DIAGNOSIS — R262 Difficulty in walking, not elsewhere classified: Secondary | ICD-10-CM

## 2017-08-08 DIAGNOSIS — M25551 Pain in right hip: Secondary | ICD-10-CM

## 2017-08-08 DIAGNOSIS — Z9181 History of falling: Secondary | ICD-10-CM

## 2017-08-08 NOTE — Therapy (Signed)
[facility] [address] [place], [address] Phone: [phone]   Fax:  [phone]  Physical Therapy Treatment  Patient Details  Name: Monique Baker MRN: [id number] Date of Birth: 23-Nov-1950 Referring Provider: Steele Sizer, MD   Encounter Date: 08/08/2017  PT End of Session - 08/08/17 0935    Visit Number  26    Number of Visits  43    Date for PT Re-Evaluation  08/23/17    PT Start Time  0935    PT Stop Time  1017    PT Time Calculation (min)  42 min    Activity Tolerance  Patient tolerated treatment well    Behavior During Therapy  [facility] for tasks assessed/performed       Past Medical History:  Diagnosis Date  . Allergy   . Depression   . GERD (gastroesophageal reflux disease)   . Hyperlipidemia   . Hypertension   . Internal hemorrhoids   . Menopausal disorder   . Osteoarthritis    hands  . Prediabetes   . Wears dentures    partial upper    Past Surgical History:  Procedure Laterality Date  . ABDOMINAL HYSTERECTOMY    . CESAREAN SECTION    . COLONOSCOPY WITH PROPOFOL N/A 03/15/2016   Procedure: COLONOSCOPY WITH PROPOFOL;  Surgeon: Christene Lye, MD;  Location: [facility];  Service: Endoscopy;  Laterality: N/A;  . HALLUX VALGUS LAPIDUS Left 06/16/2015   Procedure: HALLUX VALGUS LAPIDUS;  Surgeon: Samara Deist, DPM;  Location: [facility];  Service: Podiatry;  Laterality: Left;  WITH POPLITEAL  . HAMMER TOE SURGERY Left 06/16/2015   Procedure: HAMMER TOE CORRECTION SECOND TOE LEFT;  Surgeon: Samara Deist, DPM;  Location: [facility];  Service: Podiatry;  Laterality: Left;  prediabetic - on oral meds  . MYOMECTOMY    . trigger finger Right 12/15/2014  . Vulva cyst  11/2006    There were no vitals filed for this visit.  Subjective Assessment - 08/08/17 0937    Subjective  R hip was good Sunday. Monday was fair. Yesterday was the worst... might be on it a lot.  Was not painful but just sore to walk. Balance is doing fairly well.  Getting cateract surgery R eye on Monday. Going to ask her doctor to see if it is ok to do PT the next day Tuesday.     Pertinent History  R hip osteoarthritis. A couple of years ago, pt would have soreness in her R posterior hip. Had x-rays and was told that she has a hairline fracture which is healing itself. Pain stopped hurting afterwards. Pain however started from her R posterior hip to anterior hip at the joint area. Went to another doctor and was told that she has arthritis. Sometimes feels pain R medial thigh but not past her knee. Feels like her R leg would give way at times but does not fall. Her doctor does not think that she needs surgery for her R hip.  Did a lot of fixing up around her house yesterday and was on her hip a lot which bothered her hip.   Denies back pain.  Denies unexplained changes in weight.  Pain does not wake her up at night.   Pt assumes that her R hip hairline fracture is healed.   Pt husband is a Theme park manager and feels like she is in the "lime light." Wants to be able to walk better so people  do not have to ask her if she is ok as much.     Patient Stated Goals  Walk straight without limping.     Currently in Pain?  Yes    Pain Score  1     Pain Onset  More than a month ago                               PT Education - 08/08/17 1737    Education provided  Yes    Education Details  ther-ex    [employer]) Educated  Patient    Methods  Explanation;Demonstration;Tactile cues;Verbal cues    Comprehension  Returned demonstration;Verbalized understanding         Objectives  Pt states the burn in her R hip is healed up.   Manual therapy   Supine inferior, inferior lateral, lateral glide R hip (hip in hooklying position) with use of gait belt grade 3+ to promote joint mobility  Supine posterior glide R hip in 90/90 with lateral glide using strap grade 3+  Supine R hip  flexion with inferior glide R hip using strap 10x   No R hip pinching afterwards. Pt states the manual therapy helped a lot.     Therapeutic Exercise:   Supine R hip IR stretch with PT x 2. Increased time to help faciliate stretch  Supine muscle energytechniqueto promote R hip IR secondary to stiffness, R hip in slight abduction and about 70 degrees hip flexion. 5x5 seconds  Forward step over 8 inch cones without UE assist 10x each LE, emphasis on foot clearance.   Standing R hip extension resisting yellow band with bilateral UE assist to promote glute max strengthenig   10x2 R  10x2 L   Improved exercise technique, movement at target joints, use of target muscles after min to mod verbal, visual, tactile cues.   Decreased R hip pain following manual therapy to promote joint mobility. Continued working on obstacle negotiation and hip strenthening to help promote balance and decrease fall risk. Pt tolerated session well without aggravation of symptoms.         PT Long Term Goals - 07/24/17 1039      PT LONG TERM GOAL #1   Title  Pt will have a decrease in R hip pain to 2/10 or less at worst to promote ability to ambulate, negotiate stairs, pick up items from the floor.     Baseline  4/10 R hip pain at worst for the past 2 months (03/28/2017); 5/10 for the past 7 days (05/10/17) and (05/29/17); 5/10 R hip pain at most for the past 7 days (06/28/2017); 2/10 R hip pain at most for the past 7 days.     Time  4    Period  Weeks    Status  Achieved      PT LONG TERM GOAL #2   Title  Pt will improve R hip abduction and extension strength by at least 1/2 MMT grade to promote ability to ambulate with less R hip pain.    Baseline  On eval: R hip E: 4+/5, R hip Abd 4-/5; On 05/10/17 R hip E: 4+/5, R hip Abd 4-/5 and on (05/29/17); 4+/5 R hip extension, 4/5 R hip abduction (06/28/2017), (07/24/2017)    Time  4    Period  Weeks    Status  Partially Met    Target Date  08/23/17      PT  LONG TERM GOAL #3   Title  Pt will improve her LEFS score by at least 9 points as a demonstration of improved function.     Baseline  37/80 (03/28/2017); 38/80 on 05/10/17; 51/80    Time  3    Period  Weeks    Status  Achieved      PT LONG TERM GOAL #4   Title  Pt will report being able to tolerate walking at least 10 minutes to promote mobility.     Baseline  Pt reports walking 5 minutes bothers her R hip 03/28/2017; can walk 10-15 (05/10/17)     Time  6    Period  Weeks    Status  Achieved      PT LONG TERM GOAL #5   Title  Pt will be able to don and doff socks and shoes with 4/10 pain or less for improved QOL    Baseline  7/10 pain; 3/10    Time  3    Period  Weeks    Status  Achieved      Additional Long Term Goals   Additional Long Term Goals  Yes      PT LONG TERM GOAL #6   Title  Pt will improve her DGI score to at least 20/24 as a demonstration of decreased fall risk.     Baseline  18/24 (07/24/2017)    Time  4    Period  Weeks    Status  New    Target Date  08/23/17            Plan - 08/08/17 0936    Clinical Impression Statement  Decreased R hip pain following manual therapy to promote joint mobility. Continued working on obstacle negotiation and hip strenthening to help promote balance and decrease fall risk. Pt tolerated session well without aggravation of symptoms.     Rehab Potential  Fair    Clinical Impairments Affecting Rehab Potential  Chronicity of condition    PT Frequency  2x / week    PT Duration  4 weeks    PT Treatment/Interventions  Therapeutic activities;Therapeutic exercise;Neuromuscular re-education;Aquatic Therapy;Electrical Stimulation;Iontophoresis 61m/ml Dexamethasone;Ultrasound;Gait training;Stair training;Patient/family education;Manual techniques;Dry needling;Traction traction if appropriate    PT Next Visit Plan  Balance related activities, hip ROM, glute strengthening    Consulted and Agree with Plan of Care  Patient       Patient  will benefit from skilled therapeutic intervention in order to improve the following deficits and impairments:  Pain, Improper body mechanics, Difficulty walking, Decreased strength  Visit Diagnosis: Pain in right hip  Difficulty in walking, not elsewhere classified  History of falling     Problem List Patient Active Problem List   Diagnosis Date Noted  . Osteopenia of hip 09/04/2016  . History of bunionectomy of left great toe 07/15/2015  . H/O hammer toe correction 03/15/2015  . Arthritis of lumbar spine 01/19/2015  . Benign essential HTN 11/05/2014  . Carpal tunnel syndrome 11/05/2014  . Osteoarthritis 11/05/2014  . Dermatitis, eczematoid 11/05/2014  . Depression, major, recurrent, mild ([facility] 11/05/2014  . Dyslipidemia 11/05/2014  . Gastro-esophageal reflux disease without esophagitis 11/05/2014  . Morbid obesity due to excess calories ([facility] [place] 11/05/2014  . Dysmetabolic syndrome 051/88/4166 . Menopausal and perimenopausal disorder 11/05/2014  . Perennial allergic rhinitis with seasonal variation 11/05/2014  . Seborrhea capitis 11/05/2014  . Acquired trigger finger 11/05/2014  . Urinary incontinence in female 11/05/2014  . Vitamin D deficiency 11/05/2014  . Blood  glucose elevated 08/31/2009    Joneen Boers PT, DPT   08/08/2017, 5:41 PM  [facility] [address], [place], [address] Phone: [phone]   Fax:  [phone]  Name: KODA DEFRANK MRN: [id number] Date of Birth: [birth year]

## 2017-08-14 ENCOUNTER — Ambulatory Visit: Payer: Medicare Other

## 2017-08-16 ENCOUNTER — Ambulatory Visit: Payer: Medicare Other

## 2017-08-21 ENCOUNTER — Ambulatory Visit: Payer: Medicare Other

## 2017-08-21 DIAGNOSIS — M25551 Pain in right hip: Secondary | ICD-10-CM | POA: Diagnosis not present

## 2017-08-21 DIAGNOSIS — R262 Difficulty in walking, not elsewhere classified: Secondary | ICD-10-CM

## 2017-08-21 DIAGNOSIS — Z9181 History of falling: Secondary | ICD-10-CM
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
## 2017-08-23 ENCOUNTER — Ambulatory Visit: Payer: Medicare Other

## 2017-08-23 DIAGNOSIS — M25551 Pain in right hip: Secondary | ICD-10-CM

## 2017-08-23 DIAGNOSIS — Z9181 History of falling: Secondary | ICD-10-CM

## 2017-08-23 DIAGNOSIS — R262 Difficulty in walking, not elsewhere classified: Secondary | ICD-10-CM

## 2017-08-23 NOTE — Therapy (Signed)
[facility] [address] [place], [address] Phone: [phone]   Fax:  [phone]  Physical Therapy Treatment And Progress Report  Patient Details  Name: Monique Baker MRN: [id number] Date of Birth: 04-25-1951 Referring Provider: Steele Sizer, MD   Encounter Date: 08/23/2017  PT End of Session - 08/23/17 1351    Visit Number  28    Number of Visits  44    Date for PT Re-Evaluation  08/30/17    PT Start Time  1351    PT Stop Time  1432    PT Time Calculation (min)  41 min    Activity Tolerance  Patient tolerated treatment well    Behavior During Therapy  [facility] for tasks assessed/performed       Past Medical History:  Diagnosis Date  . Allergy   . Depression   . GERD (gastroesophageal reflux disease)   . Hyperlipidemia   . Hypertension   . Internal hemorrhoids   . Menopausal disorder   . Osteoarthritis    hands  . Prediabetes   . Wears dentures    partial upper    Past Surgical History:  Procedure Laterality Date  . ABDOMINAL HYSTERECTOMY    . CESAREAN SECTION    . COLONOSCOPY WITH PROPOFOL N/A 03/15/2016   Procedure: COLONOSCOPY WITH PROPOFOL;  Surgeon: Christene Lye, MD;  Location: [facility];  Service: Endoscopy;  Laterality: N/A;  . HALLUX VALGUS LAPIDUS Left 06/16/2015   Procedure: HALLUX VALGUS LAPIDUS;  Surgeon: Samara Deist, DPM;  Location: [place];  Service: Podiatry;  Laterality: Left;  WITH POPLITEAL  . HAMMER TOE SURGERY Left 06/16/2015   Procedure: HAMMER TOE CORRECTION SECOND TOE LEFT;  Surgeon: Samara Deist, DPM;  Location: [place];  Service: Podiatry;  Laterality: Left;  prediabetic - on oral meds  . MYOMECTOMY    . trigger finger Right 12/15/2014  . Vulva cyst  11/2006    There were no vitals filed for this visit.  Subjective Assessment - 08/23/17 1353    Subjective  Pt states she is not pinching as much. Want to continue until May  2. The therapy last session helped. 3/10 R hip pain at most for the past 7 days due to sitting 1.5 hours during Bible study (other than that, 1/10 at most).     Pertinent History  R hip osteoarthritis. A couple of years ago, pt would have soreness in her R posterior hip. Had x-rays and was told that she has a hairline fracture which is healing itself. Pain stopped hurting afterwards. Pain however started from her R posterior hip to anterior hip at the joint area. Went to another doctor and was told that she has arthritis. Sometimes feels pain R medial thigh but not past her knee. Feels like her R leg would give way at times but does not fall. Her doctor does not think that she needs surgery for her R hip.  Did a lot of fixing up around her house yesterday and was on her hip a lot which bothered her hip.   Denies back pain.  Denies unexplained changes in weight.  Pain does not wake her up at night.   Pt assumes that her R hip hairline fracture is healed.   Pt husband is a Theme park manager and feels like she is in the "lime light." Wants to be able to walk better so people do not have to ask her if she is ok  as much.     Patient Stated Goals  Walk straight without limping.     Currently in Pain?  No/denies    Pain Score  0-No pain    Pain Onset  More than a month ago                               PT Education - 08/23/17 2005    Education provided  Yes    Education Details  ther-ex, plan of care    Person(s) Educated  Patient    Methods  Explanation;Demonstration;Tactile cues;Verbal cues    Comprehension  Returned demonstration;Verbalized understanding         Objectives    Manual therapy   Supine inferior, inferiorlateral, lateral glide R hip (hip in hooklying position) with use of gait belt grade 3+ to promote joint mobility  Supine posterior glide R hip in hooklying with lateral glide using strap grade 3+  Supine R hip flexion with inferior glide R hip using strap  10x   Therapeutic Exercise:   Manual muscle testing not performed secondary to currently only 1 week post op from R cataract surgery.   Reviewed plan of care: continue until Aug 30, 2017.   L S/L R LE reverse clamshells 10x3  SLS with occasional UE assist  10x5 seconds for 2 sets R LE  10x5 seconds for 2 sets L LE   Forward step over 8 inch cones  10x R LE  10x L LE   Improved exercise technique, movement at target joints, use of target muscles after min to mod verbal, visual, tactile cues.   Pt demonstrates overall decreased R hip pain, improved balance, glute med strength, and ability to perform functional tasks since initial evaluation. R hip pain seems to improve with treatment to promote joint mobility. Pt also scored 22/24 with the Dynamic Gait Index suggesting decreased fall risk. Patient will benefit from continued skilled physical therapy services for at least 1 more week to continue improving strength, decreasing pain, decreasing fall risk, and  improving ability to perform functional tasks as well as per pt request.      PT Long Term Goals - 08/23/17 2007      PT LONG TERM GOAL #1   Title  Pt will have a decrease in R hip pain to 2/10 or less at worst to promote ability to ambulate, negotiate stairs, pick up items from the floor.     Baseline  4/10 R hip pain at worst for the past 2 months (03/28/2017); 5/10 for the past 7 days (05/10/17) and (05/29/17); 5/10 R hip pain at most for the past 7 days (06/28/2017); 2/10 R hip pain at most for the past 7 days.  Potentially acheived goal, 3/10 at most for the past 7 days due to sitting 1.5 hours at a Bible study, other than that, 1/10 pain at most for the past 7 days (08/23/2017)    Time  1    Period  Weeks    Status  Achieved    Target Date  08/30/17      PT LONG TERM GOAL #2   Title  Pt will improve R hip abduction and extension strength by at least 1/2 MMT grade to promote ability to ambulate with less R hip pain.     Baseline  On eval: R hip E: 4+/5, R hip Abd 4-/5; On 05/10/17 R hip E: 4+/5, R hip Abd 4-/5  and on (05/29/17); 4+/5 R hip extension, 4/5 R hip abduction (06/28/2017), (07/24/2017); Not tested today secondary to being only 1 week post op from R eye cataract surgery (08/21/2017)    Time  1    Period  Weeks    Status  Partially Met    Target Date  08/30/17      PT LONG TERM GOAL #3   Title  Pt will improve her LEFS score by at least 9 points as a demonstration of improved function.     Baseline  37/80 (03/28/2017); 38/80 on 05/10/17; 51/80    Time  3    Period  Weeks    Status  Achieved      PT LONG TERM GOAL #4   Title  Pt will report being able to tolerate walking at least 10 minutes to promote mobility.     Baseline  Pt reports walking 5 minutes bothers her R hip 03/28/2017; can walk 10-15 (05/10/17)     Time  6    Period  Weeks    Status  Achieved      PT LONG TERM GOAL #5   Title  Pt will be able to don and doff socks and shoes with 4/10 pain or less for improved QOL    Baseline  7/10 pain; 3/10    Time  3    Period  Weeks    Status  Achieved      PT LONG TERM GOAL #6   Title  Pt will improve her DGI score to at least 20/24 as a demonstration of decreased fall risk.     Baseline  18/24 (07/24/2017); 22/24 (08/21/2017)    Time  4    Period  Weeks    Status  Achieved            Plan - 08/23/17 2005    Clinical Impression Statement  Pt demonstrates overall decreased R hip pain, improved balance, glute med strength, and ability to perform functional tasks since initial evaluation. R hip pain seems to improve with treatment to promote joint mobility. Pt also scored 22/24 with the Dynamic Gait Index suggesting decreased fall risk. Patient will benefit from continued skilled physical therapy services for at least 1 more week to continue improving strength, decreasing pain, decreasing fall risk, and  improving ability to perform functional tasks as well as per pt request.     History  and Personal Factors relevant to plan of care:  Chronicity of condition    Clinical Presentation  Stable    Clinical Presentation due to:  Pt achieving PT goals    Clinical Decision Making  Low    Rehab Potential  Fair    Clinical Impairments Affecting Rehab Potential  Chronicity of condition    PT Frequency  2x / week    PT Duration  Other (comment) 1 week    PT Treatment/Interventions  Therapeutic activities;Therapeutic exercise;Neuromuscular re-education;Aquatic Therapy;Electrical Stimulation;Iontophoresis 66m/ml Dexamethasone;Ultrasound;Gait training;Stair training;Patient/family education;Manual techniques;Dry needling;Traction traction if appropriate    PT Next Visit Plan  Balance related activities, hip ROM, glute strengthening    Consulted and Agree with Plan of Care  Patient       Patient will benefit from skilled therapeutic intervention in order to improve the following deficits and impairments:  Pain, Improper body mechanics, Difficulty walking, Decreased strength  Visit Diagnosis: Pain in right hip - Plan: PT plan of care cert/re-cert  Difficulty in walking, not elsewhere classified - Plan: PT plan of care cert/re-cert  History of falling - Plan: PT plan of care cert/re-cert     Problem List Patient Active Problem List   Diagnosis Date Noted  . Osteopenia of hip 09/04/2016  . History of bunionectomy of left great toe 07/15/2015  . H/O hammer toe correction 03/15/2015  . Arthritis of lumbar spine 01/19/2015  . Benign essential HTN 11/05/2014  . Carpal tunnel syndrome 11/05/2014  . Osteoarthritis 11/05/2014  . Dermatitis, eczematoid 11/05/2014  . Depression, major, recurrent, mild ([place]) 11/05/2014  . Dyslipidemia 11/05/2014  . Gastro-esophageal reflux disease without esophagitis 11/05/2014  . Morbid obesity due to excess calories ([place]) 11/05/2014  . Dysmetabolic syndrome 15/72/6203  . Menopausal and perimenopausal disorder 11/05/2014  . Perennial allergic  rhinitis with seasonal variation 11/05/2014  . Seborrhea capitis 11/05/2014  . Acquired trigger finger 11/05/2014  . Urinary incontinence in female 11/05/2014  . Vitamin D deficiency 11/05/2014  . Blood glucose elevated 08/31/2009   Thank you for your referral.  Joneen Boers PT, DPT   08/23/2017, 8:20 PM  [facility] [address] [place], [address] Phone: [phone]   Fax:  [phone]  Name: Monique Baker MRN: [id number] Date of Birth: 1950-05-10

## 2017-08-28 ENCOUNTER — Ambulatory Visit: Payer: Medicare Other

## 2017-08-28 DIAGNOSIS — R262 Difficulty in walking, not elsewhere classified: Secondary | ICD-10-CM

## 2017-08-28 DIAGNOSIS — M25551 Pain in right hip: Secondary | ICD-10-CM

## 2017-08-28 DIAGNOSIS — Z9181 History of falling: Secondary | ICD-10-CM

## 2017-08-28 NOTE — Patient Instructions (Addendum)
Medbridge Access Code: [email or web address]     Side Stepping with Resistance at Thighs yellow band 30 ft to the R and 30 ft to the L 2x every other day.

## 2017-08-28 NOTE — Therapy (Signed)
[facility] [address] [place], [address] Phone: [phone]   Fax:  [phone]  Physical Therapy Treatment  Patient Details  Name: Monique Baker MRN: [id number] Date of Birth: 25-Jul-1950 Referring Provider: Steele Sizer, MD   Encounter Date: 08/28/2017  PT End of Session - 08/28/17 1453    Visit Number  29    Number of Visits  44    Date for PT Re-Evaluation  08/30/17    PT Start Time  2426    PT Stop Time  1535    PT Time Calculation (min)  42 min    Activity Tolerance  Patient tolerated treatment well    Behavior During Therapy  [facility] for tasks assessed/performed       Past Medical History:  Diagnosis Date  . Allergy   . Depression   . GERD (gastroesophageal reflux disease)   . Hyperlipidemia   . Hypertension   . Internal hemorrhoids   . Menopausal disorder   . Osteoarthritis    hands  . Prediabetes   . Wears dentures    partial upper    Past Surgical History:  Procedure Laterality Date  . ABDOMINAL HYSTERECTOMY    . CESAREAN SECTION    . COLONOSCOPY WITH PROPOFOL N/A 03/15/2016   Procedure: COLONOSCOPY WITH PROPOFOL;  Surgeon: Christene Lye, MD;  Location: [facility];  Service: Endoscopy;  Laterality: N/A;  . HALLUX VALGUS LAPIDUS Left 06/16/2015   Procedure: HALLUX VALGUS LAPIDUS;  Surgeon: Samara Deist, DPM;  Location: [place];  Service: Podiatry;  Laterality: Left;  WITH POPLITEAL  . HAMMER TOE SURGERY Left 06/16/2015   Procedure: HAMMER TOE CORRECTION SECOND TOE LEFT;  Surgeon: Samara Deist, DPM;  Location: [place];  Service: Podiatry;  Laterality: Left;  prediabetic - on oral meds  . MYOMECTOMY    . trigger finger Right 12/15/2014  . Vulva cyst  11/2006    There were no vitals filed for this visit.  Subjective Assessment - 08/28/17 1454    Subjective  R hip does not have pain but sort of gives way when she walks. Feels weak R lateral hip.  It has always done it but not bad enough to mention it. Started happening more frequently today.  Feels like a catch.  Usually happens when she is walking.     Pertinent History  R hip osteoarthritis. A couple of years ago, pt would have soreness in her R posterior hip. Had x-rays and was told that she has a hairline fracture which is healing itself. Pain stopped hurting afterwards. Pain however started from her R posterior hip to anterior hip at the joint area. Went to another doctor and was told that she has arthritis. Sometimes feels pain R medial thigh but not past her knee. Feels like her R leg would give way at times but does not fall. Her doctor does not think that she needs surgery for her R hip.  Did a lot of fixing up around her house yesterday and was on her hip a lot which bothered her hip.   Denies back pain.  Denies unexplained changes in weight.  Pain does not wake her up at night.   Pt assumes that her R hip hairline fracture is healed.   Pt husband is a Theme park manager and feels like she is in the "lime light." Wants to be able to walk better so people do not have to ask her if she is  ok as much.     Patient Stated Goals  Walk straight without limping.     Currently in Pain?  No/denies    Pain Score  0-No pain    Pain Onset  More than a month ago                               PT Education - 08/28/17 1515    Education provided  Yes    Education Details  ther-ex, HEP    Person(s) Educated  Patient    Methods  Explanation;Demonstration;Tactile cues;Verbal cues;Handout    Comprehension  Returned demonstration;Verbalized understanding         Objectives  Medbridge Access Code: [email or web address]    Therapeutic Exercise:   Walking 3:37 minutes. 637 ft. No buckling sensation. Felt like the walk made her R hip feel stronger   Side stepping 32 ft to the R and 32 ft to the L with yellow band resistance just above knees to promote glute med muscle strengthening for 2  sets  Forward wedding march 32 ft x 2 resisting yellow band to promote glute strengthening  SLS on R LE 10 seconds, then 10x5 seconds to promote glute med muscle strengthening   Standing R LE leg press resisting blue band 10x3 with bilateral UE assist    Gait around gym 2 minutes (300 ft). No giving way R LE  Standing R hip extension resisting red band 10x2   Forward step up onto dyna disc with R LE and L UE assist 10x   Check the back next time if R hip continues to feel like it gives way if appropriate.    Improved exercise technique, movement at target joints, use of target muscles after min to mod verbal, visual, tactile cues.    Per pt, R hip felt stronger after walking initially. Worked on R glute strengthening to help decrease buckling sensation R LE. Pt tolerated session well without aggravation of symptoms. Good carry over of decreased pain from previous sessions.    PT Long Term Goals - 08/23/17 2007      PT LONG TERM GOAL #1   Title  Pt will have a decrease in R hip pain to 2/10 or less at worst to promote ability to ambulate, negotiate stairs, pick up items from the floor.     Baseline  4/10 R hip pain at worst for the past 2 months (03/28/2017); 5/10 for the past 7 days (05/10/17) and (05/29/17); 5/10 R hip pain at most for the past 7 days (06/28/2017); 2/10 R hip pain at most for the past 7 days.  Potentially acheived goal, 3/10 at most for the past 7 days due to sitting 1.5 hours at a Bible study, other than that, 1/10 pain at most for the past 7 days (08/23/2017)    Time  1    Period  Weeks    Status  Achieved    Target Date  08/30/17      PT LONG TERM GOAL #2   Title  Pt will improve R hip abduction and extension strength by at least 1/2 MMT grade to promote ability to ambulate with less R hip pain.    Baseline  On eval: R hip E: 4+/5, R hip Abd 4-/5; On 05/10/17 R hip E: 4+/5, R hip Abd 4-/5 and on (05/29/17); 4+/5 R hip extension, 4/5 R hip abduction (06/28/2017),  (07/24/2017); Not tested today secondary to being  only 1 week post op from R eye cataract surgery (08/21/2017)    Time  1    Period  Weeks    Status  Partially Met    Target Date  08/30/17      PT LONG TERM GOAL #3   Title  Pt will improve her LEFS score by at least 9 points as a demonstration of improved function.     Baseline  37/80 (03/28/2017); 38/80 on 05/10/17; 51/80    Time  3    Period  Weeks    Status  Achieved      PT LONG TERM GOAL #4   Title  Pt will report being able to tolerate walking at least 10 minutes to promote mobility.     Baseline  Pt reports walking 5 minutes bothers her R hip 03/28/2017; can walk 10-15 (05/10/17)     Time  6    Period  Weeks    Status  Achieved      PT LONG TERM GOAL #5   Title  Pt will be able to don and doff socks and shoes with 4/10 pain or less for improved QOL    Baseline  7/10 pain; 3/10    Time  3    Period  Weeks    Status  Achieved      PT LONG TERM GOAL #6   Title  Pt will improve her DGI score to at least 20/24 as a demonstration of decreased fall risk.     Baseline  18/24 (07/24/2017); 22/24 (08/21/2017)    Time  4    Period  Weeks    Status  Achieved            Plan - 08/28/17 1515    Clinical Impression Statement  Per pt, R hip felt stronger after walking initially. Worked on R glute strengthening to help decrease buckling sensation R LE. Pt tolerated session well without aggravation of symptoms. Good carry over of decreased pain from previous sessions.     Rehab Potential  Fair    Clinical Impairments Affecting Rehab Potential  Chronicity of condition    PT Frequency  2x / week    PT Duration  Other (comment) 1 week    PT Treatment/Interventions  Therapeutic activities;Therapeutic exercise;Neuromuscular re-education;Aquatic Therapy;Electrical Stimulation;Iontophoresis 57m/ml Dexamethasone;Ultrasound;Gait training;Stair training;Patient/family education;Manual techniques;Dry needling;Traction traction if appropriate     PT Next Visit Plan  Balance related activities, hip ROM, glute strengthening    Consulted and Agree with Plan of Care  Patient       Patient will benefit from skilled therapeutic intervention in order to improve the following deficits and impairments:  Pain, Improper body mechanics, Difficulty walking, Decreased strength  Visit Diagnosis: Pain in right hip  Difficulty in walking, not elsewhere classified  History of falling     Problem List Patient Active Problem List   Diagnosis Date Noted  . Osteopenia of hip 09/04/2016  . History of bunionectomy of left great toe 07/15/2015  . H/O hammer toe correction 03/15/2015  . Arthritis of lumbar spine 01/19/2015  . Benign essential HTN 11/05/2014  . Carpal tunnel syndrome 11/05/2014  . Osteoarthritis 11/05/2014  . Dermatitis, eczematoid 11/05/2014  . Depression, major, recurrent, mild [place] 11/05/2014  . Dyslipidemia 11/05/2014  . Gastro-esophageal reflux disease without esophagitis 11/05/2014  . Morbid obesity due to excess calories ([place] 11/05/2014  . Dysmetabolic syndrome 024/23/5361 . Menopausal and perimenopausal disorder 11/05/2014  . Perennial allergic rhinitis with seasonal variation 11/05/2014  .  Seborrhea capitis 11/05/2014  . Acquired trigger finger 11/05/2014  . Urinary incontinence in female 11/05/2014  . Vitamin D deficiency 11/05/2014  . Blood glucose elevated 08/31/2009    Joneen Boers PT, DPT   08/28/2017, 3:45 PM   [facility] [address] [place], [address] Phone: [phone]   Fax:  [phone]  Name: Monique Baker MRN: [id number] Date of Birth: 1950/11/21

## 2017-08-30 ENCOUNTER — Ambulatory Visit: Payer: Medicare Other | Attending: Family Medicine

## 2017-08-30 DIAGNOSIS — M25551 Pain in right hip: Secondary | ICD-10-CM | POA: Diagnosis present

## 2017-08-30 DIAGNOSIS — R262 Difficulty in walking, not elsewhere classified: Secondary | ICD-10-CM | POA: Diagnosis present

## 2017-08-30 DIAGNOSIS — Z9181 History of falling: Secondary | ICD-10-CM | POA: Insufficient documentation

## 2017-08-30 NOTE — Therapy (Signed)
[facility] [address], [place], [address] Phone: [phone]   Fax:  [phone]  Physical Therapy Treatment  Patient Details  Name: Monique Baker MRN: [id number] Date of Birth: 19-Apr-1951 Referring Provider: Steele Sizer, MD   Encounter Date: 08/30/2017  PT End of Session - 08/30/17 1036    Visit Number  30    Number of Visits  60    Date for PT Re-Evaluation  10/25/17    PT Start Time  1036    PT Stop Time  1117    PT Time Calculation (min)  41 min    Activity Tolerance  Patient tolerated treatment well    Behavior During Therapy  [facility] for tasks assessed/performed       Past Medical History:  Diagnosis Date  . Allergy   . Depression   . GERD (gastroesophageal reflux disease)   . Hyperlipidemia   . Hypertension   . Internal hemorrhoids   . Menopausal disorder   . Osteoarthritis    hands  . Prediabetes   . Wears dentures    partial upper    Past Surgical History:  Procedure Laterality Date  . ABDOMINAL HYSTERECTOMY    . CESAREAN SECTION    . COLONOSCOPY WITH PROPOFOL N/A 03/15/2016   Procedure: COLONOSCOPY WITH PROPOFOL;  Surgeon: Christene Lye, MD;  Location: [facility] ENDOSCOPY;  Service: Endoscopy;  Laterality: N/A;  . HALLUX VALGUS LAPIDUS Left 06/16/2015   Procedure: HALLUX VALGUS LAPIDUS;  Surgeon: Samara Deist, DPM;  Location: [place];  Service: Podiatry;  Laterality: Left;  WITH POPLITEAL  . HAMMER TOE SURGERY Left 06/16/2015   Procedure: HAMMER TOE CORRECTION SECOND TOE LEFT;  Surgeon: Samara Deist, DPM;  Location: [facility];  Service: Podiatry;  Laterality: Left;  prediabetic - on oral meds  . MYOMECTOMY    . trigger finger Right 12/15/2014  . Vulva cyst  11/2006    There were no vitals filed for this visit.  Subjective Assessment - 08/30/17 1038    Subjective  R hip is doing pretty good. No pain at the moment. Has a little pinching R hip but not as  bad. Feels stiff and sore. Balance is pretty good.  R hip still feels like it gives way at times.  The frequency of the giving way is less. Only happend once since last session.   The exercises at home helps.  Has a L eye surgery Monday Sep 03, 2017.  No falls since last time. Almost fell yesterday going up stairs becuase of difficulty lifting R leg high enough to get to the last step while carrying laundry.  Performing R hip flexion bothers her.     Pertinent History  R hip osteoarthritis. A couple of years ago, pt would have soreness in her R posterior hip. Had x-rays and was told that she has a hairline fracture which is healing itself. Pain stopped hurting afterwards. Pain however started from her R posterior hip to anterior hip at the joint area. Went to another doctor and was told that she has arthritis. Sometimes feels pain R medial thigh but not past her knee. Feels like her R leg would give way at times but does not fall. Her doctor does not think that she needs surgery for her R hip.  Did a lot of fixing up around her house yesterday and was on her hip a lot which bothered her hip.   Denies back pain.  Denies unexplained changes in weight.  Pain does not wake her up at night.   Pt assumes that her R hip hairline fracture is healed.   Pt husband is a Theme park manager and feels like she is in the "lime light." Wants to be able to walk better so people do not have to ask her if she is ok as much.     Patient Stated Goals  Walk straight without limping.     Currently in Pain?  No/denies    Pain Score  0-No pain    Pain Onset  More than a month ago                               PT Education - 08/30/17 1053    Education provided  Yes    Education Details  ther-ex, plan of care    Person(s) Educated  Patient    Methods  Explanation;Demonstration;Tactile cues;Verbal cues    Comprehension  Returned demonstration;Verbalized understanding          Objectives  Pt states leaving Sep 22, 2017 to [place] for a week, then going to [place] for a conference June 2 until Friday.     Medbridge Access Code: [email or web address]    Therapeutic Exercise:   Reviewed plan of care: continue 2x/week for 8 weeks to promote ability to perform R hip flexion to clear step and negotiate steps more safely. 8 weeks secondary to missed visits due to eye surgery, and her trips.    Seated R LE leg press resisting blue band 10x3 to promote glute strengthening.  Side stepping 32 ft to the R and 32 ft to the L with yellow band resistance just above knees to promote glute med muscle strengthening for 2 sets  Standing R LE leg press resisting blue band 10x3 with bilateral UE assist   Standing R hip flexion with strap promote posterior lateral glide, bilateral UE assist 10x3  Improved exercise technique, movement at target joints, use of target muscles after min to mod verbal, visual, tactile cues.    Pt states R hip feels good, like its been exercising at end of session.     Pt demonstrates overall improved R hip pain, hip abduction strength, ability to tolerate walking longer distances, perform functional tasks and improved balance since initial evaluation. Pt still however demonstrates difficulty with R hip flexion due to pain which makes stepping over obstacles or stair negotiation difficult and unsteady at times. Pt also stated recently that her R hip feels like it gives way at times when walking but has not mentioned it before because it was not bad enough to bring up. Patient will benefit from continued skilled physical therapy services to address the aforementioned deficits.    PT Long Term Goals - 08/30/17 1323      PT LONG TERM GOAL #1   Title  Pt will have a decrease in R hip pain to 2/10 or less at worst to promote ability to ambulate, negotiate stairs, pick up items from the floor.     Baseline  4/10 R hip pain at worst for the past 2 months (03/28/2017); 5/10 for the past 7 days  (05/10/17) and (05/29/17); 5/10 R hip pain at most for the past 7 days (06/28/2017); 2/10 R hip pain at most for the past 7 days.  Potentially acheived goal, 3/10 at most for the past 7 days due to sitting 1.5 hours at  a Bible study, other than that, 1/10 pain at most for the past 7 days (08/23/2017)    Time  1    Period  Weeks    Status  Achieved      PT LONG TERM GOAL #2   Title  Pt will improve R hip abduction and extension strength by at least 1/2 MMT grade to promote ability to ambulate with less R hip pain.    Baseline  On eval: R hip E: 4+/5, R hip Abd 4-/5; On 05/10/17 R hip E: 4+/5, R hip Abd 4-/5 and on (05/29/17); 4+/5 R hip extension, 4/5 R hip abduction (06/28/2017), (07/24/2017); Not tested today secondary to being only 1 week post op from R eye cataract surgery (08/21/2017)    Time  8    Period  Weeks    Status  Partially Met    Target Date  10/25/17      PT LONG TERM GOAL #3   Title  Pt will improve her LEFS score by at least 9 points as a demonstration of improved function.     Baseline  37/80 (03/28/2017); 38/80 on 05/10/17; 51/80    Time  3    Period  Weeks    Status  Achieved      PT LONG TERM GOAL #4   Title  Pt will report being able to tolerate walking at least 10 minutes to promote mobility.     Baseline  Pt reports walking 5 minutes bothers her R hip 03/28/2017; can walk 10-15 (05/10/17)     Time  6    Period  Weeks    Status  Achieved      PT LONG TERM GOAL #5   Title  Pt will be able to don and doff socks and shoes with 4/10 pain or less for improved QOL    Baseline  7/10 pain; 3/10    Time  3    Period  Weeks    Status  Achieved      Additional Long Term Goals   Additional Long Term Goals  Yes      PT LONG TERM GOAL #6   Title  Pt will improve her DGI score to at least 20/24 as a demonstration of decreased fall risk.     Baseline  18/24 (07/24/2017); 22/24 (08/21/2017)    Time  4    Period  Weeks    Status  Achieved      PT LONG TERM GOAL #7   Title  Pt  will report improved ability to perform R hip flexion to step onto a step or over obstacles with less pain to help decrease fall risk and improve ability to perform standing tasks.     Baseline  Difficulty with R hip flexion to step onto a step or over obstacles due to pain (08/30/2017)    Time  8    Period  Weeks    Status  New    Target Date  10/25/17      PT LONG TERM GOAL #8   Title  Pt will report decreased occurences of R LE buckling while walking to promote mobility and decrease fall risk.     Baseline  Pt states R LE buckling (no falls) while walking (08/30/2017).     Time  8    Period  Weeks    Status  New    Target Date  10/25/17            Plan -  08/30/17 1053    Clinical Impression Statement  Pt demonstrates overall improved R hip pain, hip abduction strength, ability to tolerate walking longer distances, perform functional tasks and improved balance since initial evaluation. Pt still however demonstrates difficulty with R hip flexion due to pain which makes stepping over obstacles or stair negotiation difficult and unsteady at times. Pt also stated recently that her R hip feels like it gives way at times when walking but has not mentioned it before because it was not bad enough to bring up. Patient will benefit from continued skilled physical therapy services to address the aforementioned deficits.     History and Personal Factors relevant to plan of care:  Chronicity of condition    Clinical Presentation  Stable    Clinical Presentation due to:  Pt making progress towards goals, decreasing hip pain    Clinical Decision Making  Low    Rehab Potential  Fair    Clinical Impairments Affecting Rehab Potential  Chronicity of condition    PT Frequency  2x / week    PT Duration  8 weeks    PT Treatment/Interventions  Therapeutic activities;Therapeutic exercise;Neuromuscular re-education;Aquatic Therapy;Electrical Stimulation;Iontophoresis 58m/ml Dexamethasone;Ultrasound;Gait  training;Stair training;Patient/family education;Manual techniques;Dry needling;Traction traction if appropriate    PT Next Visit Plan  Balance related activities, hip ROM, glute strengthening    Consulted and Agree with Plan of Care  Patient       Patient will benefit from skilled therapeutic intervention in order to improve the following deficits and impairments:  Pain, Improper body mechanics, Difficulty walking, Decreased strength  Visit Diagnosis: Pain in right hip - Plan: PT plan of care cert/re-cert  Difficulty in walking, not elsewhere classified - Plan: PT plan of care cert/re-cert  History of falling - Plan: PT plan of care cert/re-cert     Problem List Patient Active Problem List   Diagnosis Date Noted  . Osteopenia of hip 09/04/2016  . History of bunionectomy of left great toe 07/15/2015  . H/O hammer toe correction 03/15/2015  . Arthritis of lumbar spine 01/19/2015  . Benign essential HTN 11/05/2014  . Carpal tunnel syndrome 11/05/2014  . Osteoarthritis 11/05/2014  . Dermatitis, eczematoid 11/05/2014  . Depression, major, recurrent, mild ([place] 11/05/2014  . Dyslipidemia 11/05/2014  . Gastro-esophageal reflux disease without esophagitis 11/05/2014  . Morbid obesity due to excess calories ([place] 11/05/2014  . Dysmetabolic syndrome 035/57/3220 . Menopausal and perimenopausal disorder 11/05/2014  . Perennial allergic rhinitis with seasonal variation 11/05/2014  . Seborrhea capitis 11/05/2014  . Acquired trigger finger 11/05/2014  . Urinary incontinence in female 11/05/2014  . Vitamin D deficiency 11/05/2014  . Blood glucose elevated 08/31/2009    MJoneen BoersPT, DPT   08/30/2017, 1:54 PM  [facility] [address] [place] [address]Phone: [phone]  Fax:  [phone] Name: Monique ERTLMRN: [id number] of Birth: 1May 24, 1952

## 2017-09-06 ENCOUNTER — Ambulatory Visit: Payer: Medicare Other

## 2017-09-11 ENCOUNTER — Ambulatory Visit: Payer: Medicare Other

## 2017-09-11 DIAGNOSIS — R262 Difficulty in walking, not elsewhere classified: Secondary | ICD-10-CM

## 2017-09-11 DIAGNOSIS — M25551 Pain in right hip: Secondary | ICD-10-CM | POA: Diagnosis not present

## 2017-09-11 DIAGNOSIS — Z9181 History of falling: Secondary | ICD-10-CM

## 2017-09-11 NOTE — Therapy (Signed)
[facility] [address], [place], [address] Phone: [phone]   Fax:  [phone]  Physical Therapy Treatment  Patient Details  Name: Monique Baker MRN: [id number] Date of Birth: 04/02/1951 Referring Provider: Steele Sizer, MD   Encounter Date: 09/11/2017  PT End of Session - 09/11/17 1605    Visit Number  31    Number of Visits  60    Date for PT Re-Evaluation  10/25/17    PT Start Time  1962    PT Stop Time  1647    PT Time Calculation (min)  42 min    Activity Tolerance  Patient tolerated treatment well    Behavior During Therapy  [facility] for tasks assessed/performed       Past Medical History:  Diagnosis Date  . Allergy   . Depression   . GERD (gastroesophageal reflux disease)   . Hyperlipidemia   . Hypertension   . Internal hemorrhoids   . Menopausal disorder   . Osteoarthritis    hands  . Prediabetes   . Wears dentures    partial upper    Past Surgical History:  Procedure Laterality Date  . ABDOMINAL HYSTERECTOMY    . CESAREAN SECTION    . COLONOSCOPY WITH PROPOFOL N/A 03/15/2016   Procedure: COLONOSCOPY WITH PROPOFOL;  Surgeon: Christene Lye, MD;  Location: [facility];  Service: Endoscopy;  Laterality: N/A;  . HALLUX VALGUS LAPIDUS Left 06/16/2015   Procedure: HALLUX VALGUS LAPIDUS;  Surgeon: Samara Deist, DPM;  Location: [facility];  Service: Podiatry;  Laterality: Left;  WITH POPLITEAL  . HAMMER TOE SURGERY Left 06/16/2015   Procedure: HAMMER TOE CORRECTION SECOND TOE LEFT;  Surgeon: Samara Deist, DPM;  Location: [facility];  Service: Podiatry;  Laterality: Left;  prediabetic - on oral meds  . MYOMECTOMY    . trigger finger Right 12/15/2014  . Vulva cyst  11/2006    There were no vitals filed for this visit.  Subjective Assessment - 09/11/17 1606    Subjective  Had L eye cateract surgery last Monday (09/03/2017). R hip was pretty good after sitting  on the heating pad this morning and moving around. Today is doing fairly well. Did not have any major problems in her R hip.  Also took a couple of motrin one day.  1/10 R hip pain currently 2/10 when standing and walking from the waiting room to the treatment room.  3/10 R hip pain at worst for the past 7 days.  Had a giving way sensation twice today but then she would keep walking and its fine.     Pertinent History  R hip osteoarthritis. A couple of years ago, pt would have soreness in her R posterior hip. Had x-rays and was told that she has a hairline fracture which is healing itself. Pain stopped hurting afterwards. Pain however started from her R posterior hip to anterior hip at the joint area. Went to another doctor and was told that she has arthritis. Sometimes feels pain R medial thigh but not past her knee. Feels like her R leg would give way at times but does not fall. Her doctor does not think that she needs surgery for her R hip.  Did a lot of fixing up around her house yesterday and was on her hip a lot which bothered her hip.   Denies back pain.  Denies unexplained changes in weight.  Pain does not wake her up  at night.   Pt assumes that her R hip hairline fracture is healed.   Pt husband is a Theme park manager and feels like she is in the "lime light." Wants to be able to walk better so people do not have to ask her if she is ok as much.     Patient Stated Goals  Walk straight without limping.     Currently in Pain?  Yes    Pain Score  2  when walking    Pain Onset  More than a month ago                               PT Education - 09/11/17 1631    Education provided  Yes    Education Details  ther-ex    [employer]) Educated  Patient    Methods  Explanation;Demonstration;Tactile cues;Verbal cues    Comprehension  Returned demonstration;Verbalized understanding         Objectives  Pt states leaving Sep 22, 2017 to [place] for a week, then going to [place] for a  conference June 2 until Friday.     Medbridge Access Code: [email or web address]  Manual therapy  Supine inferior, inferiorlateral, lateral glide R hip (hip in hooklying position) with use of gait belt grade 3+ to promote joint mobility  Supine posterior glide R hip in90 degrees flexion with lateral glide using strap grade 3+  Supine R hip flexion with inferior glide R hip using strap 10x3    Therapeutic Exercise:   S/L R hip abduction 10x3  Seated R LE leg press resisting blue band 10x3 to promote glute strengthening.  Standing R hip extension resisting red band 10x3  Standing R hip abduction resisting red band with bilateral UE assist 10x3   SLS R LE 9x 5 seconds with occasional light touch assist PRN. R hip popping sensation, non-painful.    Improved exercise technique, movement at target joints, use of target muscles after min to mod verbal, visual, tactile cues.   Continued working on R hip joint mobility to promote ability to perform R hip flexion to step over obstacles more comfortably, and glute strengthening to promote ability to stand on R LE more comfortably and decrease sensation of R LE giving way. No R hip pain in sitting or walking after session.       PT Long Term Goals - 08/30/17 1323      PT LONG TERM GOAL #1   Title  Pt will have a decrease in R hip pain to 2/10 or less at worst to promote ability to ambulate, negotiate stairs, pick up items from the floor.     Baseline  4/10 R hip pain at worst for the past 2 months (03/28/2017); 5/10 for the past 7 days (05/10/17) and (05/29/17); 5/10 R hip pain at most for the past 7 days (06/28/2017); 2/10 R hip pain at most for the past 7 days.  Potentially acheived goal, 3/10 at most for the past 7 days due to sitting 1.5 hours at a Bible study, other than that, 1/10 pain at most for the past 7 days (08/23/2017)    Time  1    Period  Weeks    Status  Achieved      PT LONG TERM GOAL #2   Title  Pt will  improve R hip abduction and extension strength by at least 1/2 MMT grade to promote ability to ambulate with less R  hip pain.    Baseline  On eval: R hip E: 4+/5, R hip Abd 4-/5; On 05/10/17 R hip E: 4+/5, R hip Abd 4-/5 and on (05/29/17); 4+/5 R hip extension, 4/5 R hip abduction (06/28/2017), (07/24/2017); Not tested today secondary to being only 1 week post op from R eye cataract surgery (08/21/2017)    Time  8    Period  Weeks    Status  Partially Met    Target Date  10/25/17      PT LONG TERM GOAL #3   Title  Pt will improve her LEFS score by at least 9 points as a demonstration of improved function.     Baseline  37/80 (03/28/2017); 38/80 on 05/10/17; 51/80    Time  3    Period  Weeks    Status  Achieved      PT LONG TERM GOAL #4   Title  Pt will report being able to tolerate walking at least 10 minutes to promote mobility.     Baseline  Pt reports walking 5 minutes bothers her R hip 03/28/2017; can walk 10-15 (05/10/17)     Time  6    Period  Weeks    Status  Achieved      PT LONG TERM GOAL #5   Title  Pt will be able to don and doff socks and shoes with 4/10 pain or less for improved QOL    Baseline  7/10 pain; 3/10    Time  3    Period  Weeks    Status  Achieved      Additional Long Term Goals   Additional Long Term Goals  Yes      PT LONG TERM GOAL #6   Title  Pt will improve her DGI score to at least 20/24 as a demonstration of decreased fall risk.     Baseline  18/24 (07/24/2017); 22/24 (08/21/2017)    Time  4    Period  Weeks    Status  Achieved      PT LONG TERM GOAL #7   Title  Pt will report improved ability to perform R hip flexion to step onto a step or over obstacles with less pain to help decrease fall risk and improve ability to perform standing tasks.     Baseline  Difficulty with R hip flexion to step onto a step or over obstacles due to pain (08/30/2017)    Time  8    Period  Weeks    Status  New    Target Date  10/25/17      PT LONG TERM GOAL #8   Title   Pt will report decreased occurences of R LE buckling while walking to promote mobility and decrease fall risk.     Baseline  Pt states R LE buckling (no falls) while walking (08/30/2017).     Time  8    Period  Weeks    Status  New    Target Date  10/25/17            Plan - 09/11/17 1631    Clinical Impression Statement  Continued working on R hip joint mobility to promote ability to perform R hip flexion to step over obstacles more comfortably, and glute strengthening to promote ability to stand on R LE more comfortably and decrease sensation of R LE giving way. No R hip pain in sitting or walking after session.     Rehab Potential  Fair  Clinical Impairments Affecting Rehab Potential  Chronicity of condition    PT Frequency  2x / week    PT Duration  8 weeks    PT Treatment/Interventions  Therapeutic activities;Therapeutic exercise;Neuromuscular re-education;Aquatic Therapy;Electrical Stimulation;Iontophoresis 18m/ml Dexamethasone;Ultrasound;Gait training;Stair training;Patient/family education;Manual techniques;Dry needling;Traction traction if appropriate    PT Next Visit Plan  Balance related activities, hip ROM, glute strengthening    Consulted and Agree with Plan of Care  Patient       Patient will benefit from skilled therapeutic intervention in order to improve the following deficits and impairments:  Pain, Improper body mechanics, Difficulty walking, Decreased strength  Visit Diagnosis: Pain in right hip  Difficulty in walking, not elsewhere classified  History of falling     Problem List Patient Active Problem List   Diagnosis Date Noted  . Osteopenia of hip 09/04/2016  . History of bunionectomy of left great toe 07/15/2015  . H/O hammer toe correction 03/15/2015  . Arthritis of lumbar spine 01/19/2015  . Benign essential HTN 11/05/2014  . Carpal tunnel syndrome 11/05/2014  . Osteoarthritis 11/05/2014  . Dermatitis, eczematoid 11/05/2014  . Depression,  major, recurrent, mild ([place] 11/05/2014  . Dyslipidemia 11/05/2014  . Gastro-esophageal reflux disease without esophagitis 11/05/2014  . Morbid obesity due to excess calories ([place] 11/05/2014  . Dysmetabolic syndrome 075/01/2584 . Menopausal and perimenopausal disorder 11/05/2014  . Perennial allergic rhinitis with seasonal variation 11/05/2014  . Seborrhea capitis 11/05/2014  . Acquired trigger finger 11/05/2014  . Urinary incontinence in female 11/05/2014  . Vitamin D deficiency 11/05/2014  . Blood glucose elevated 08/31/2009     MJoneen BoersPT, DPT  09/11/2017, 7:06 PM  [facility] [address] [place] [address]Phone: [phone]  Fax:  [phone] Name: Monique SLUTSKYMRN: [id number] of Birth: [birth year]

## 2017-09-17 ENCOUNTER — Telehealth: Payer: Self-pay | Admitting: Family Medicine

## 2017-09-17 ENCOUNTER — Other Ambulatory Visit: Payer: Self-pay | Admitting: Family Medicine

## 2017-09-17 MED ORDER — MELOXICAM 15 MG PO TABS: 15.0000 mg | ORAL_TABLET | Freq: Every day | ORAL | 0 refills | Status: DC

## 2017-09-17 NOTE — Telephone Encounter (Signed)
Copied from CRM [id number]. Topic: Quick Communication - See Telephone Encounter >> Sep 17, 2017 11:10 AM Cipriano Bunker wrote: CRM for notification. See Telephone encounter for: 09/17/17.  Pt. Calling regarding Meloxicam prescription - she is going out of town and wants to have for vacation (1 Month) she says it works on pain for hip. She has not taken for over a year.  Please call pt and let her know.  Pt. Leaves end of week.    [facility] [address] [place] - [address] [place] [address] Phone: [phone] Fax: [phone]

## 2017-09-18 ENCOUNTER — Ambulatory Visit: Payer: Medicare Other

## 2017-09-18 DIAGNOSIS — R262 Difficulty in walking, not elsewhere classified: Secondary | ICD-10-CM

## 2017-09-18 DIAGNOSIS — Z9181 History of falling: Secondary | ICD-10-CM

## 2017-09-18 DIAGNOSIS — M25551 Pain in right hip: Secondary | ICD-10-CM | POA: Diagnosis not present

## 2017-09-18 NOTE — Therapy (Signed)
[facility] [address], [place], [address] Phone: [phone]   Fax:  [phone]  Physical Therapy Treatment  Patient Details  Name: Monique Baker MRN: [id number] Date of Birth: 1950-07-25 Referring Provider: Steele Sizer, MD   Encounter Date: 09/18/2017  PT End of Session - 09/18/17 0932    Visit Number  32    Number of Visits  60    Date for PT Re-Evaluation  10/25/17    PT Start Time  0935    PT Stop Time  1018    PT Time Calculation (min)  43 min    Activity Tolerance  Patient tolerated treatment well    Behavior During Therapy  [facility] for tasks assessed/performed       Past Medical History:  Diagnosis Date  . Allergy   . Depression   . GERD (gastroesophageal reflux disease)   . Hyperlipidemia   . Hypertension   . Internal hemorrhoids   . Menopausal disorder   . Osteoarthritis    hands  . Prediabetes   . Wears dentures    partial upper    Past Surgical History:  Procedure Laterality Date  . ABDOMINAL HYSTERECTOMY    . CESAREAN SECTION    . COLONOSCOPY WITH PROPOFOL N/A 03/15/2016   Procedure: COLONOSCOPY WITH PROPOFOL;  Surgeon: Christene Lye, MD;  Location: [facility];  Service: Endoscopy;  Laterality: N/A;  . HALLUX VALGUS LAPIDUS Left 06/16/2015   Procedure: HALLUX VALGUS LAPIDUS;  Surgeon: Samara Deist, DPM;  Location: [facility];  Service: Podiatry;  Laterality: Left;  WITH POPLITEAL  . HAMMER TOE SURGERY Left 06/16/2015   Procedure: HAMMER TOE CORRECTION SECOND TOE LEFT;  Surgeon: Samara Deist, DPM;  Location: [facility];  Service: Podiatry;  Laterality: Left;  prediabetic - on oral meds  . MYOMECTOMY    . trigger finger Right 12/15/2014  . Vulva cyst  11/2006    There were no vitals filed for this visit.  Subjective Assessment - 09/18/17 0935    Subjective  Has been having a lot of R hip pain. Taking meloxicam. Did just fine after last session,  last week. R hip was good until yesterday. Pt fell Sunday due to carrying an overnight bag filled with clothes and tripped onto a 1 inch bottom of a door frame with her L foot. Pt felt like she was not being careful. Pt passed that door all the time.  Pt does not feel like it was balance. Felt like it was clumsiness.  Being able to stand on her R hip kind of saved her. Did not get hurt.  Has not fallen other than that.  3/10 R hip pain currently.     Pertinent History  R hip osteoarthritis. A couple of years ago, pt would have soreness in her R posterior hip. Had x-rays and was told that she has a hairline fracture which is healing itself. Pain stopped hurting afterwards. Pain however started from her R posterior hip to anterior hip at the joint area. Went to another doctor and was told that she has arthritis. Sometimes feels pain R medial thigh but not past her knee. Feels like her R leg would give way at times but does not fall. Her doctor does not think that she needs surgery for her R hip.  Did a lot of fixing up around her house yesterday and was on her hip a lot which bothered her hip.   Denies  back pain.  Denies unexplained changes in weight.  Pain does not wake her up at night.   Pt assumes that her R hip hairline fracture is healed.   Pt husband is a Theme park manager and feels like she is in the "lime light." Wants to be able to walk better so people do not have to ask her if she is ok as much.     Patient Stated Goals  Walk straight without limping.     Currently in Pain?  Yes    Pain Score  3     Pain Onset  More than a month ago                               PT Education - 09/18/17 1926    Education provided  Yes    Education Details  ther-ex    [employer]) Educated  Patient    Methods  Explanation;Demonstration;Tactile cues;Verbal cues    Comprehension  Verbalized understanding;Returned demonstration         Objectives  Pt states leaving Sep 22, 2017 to [place] for a  week, then going to [place] for a conference June 2 until Friday.    Medbridge Access Code: [email or web address]  Manual therapy   Supine inferior, inferiorlateral, lateral glide R hip (hip in hooklying position) with use of gait belt grade 3+ to promote joint mobility  Supine posterior glide R hip in90 degrees flexion with lateral glide using strap grade 3+  Supine R hip flexion with inferior glide R hip using strap 10x3  Supine R hip IR with inferior glide R hip using strap 3x10   Therapeutic Exercise:    SLS R LE 2x 10 seconds. R hip joint pressure felt  Then 5x10 seconds after manual therapy. Decreased R hip discomfort after manual therapy.  Then 5x10 seconds with L hip flexion   Then with L LE swing phase 5x10 seconds    Occasional L UE assist as needed   Improved exercise technique, movement at target joints, use of target muscles after mod verbal, visual, tactile cues.   Imporved ability to perform R LE SLS with practice. Perfomed to promote ability to step over obstacles safely. Continued working on R hip joint mobility to promtoe ability to support herself on her R LE more comfortably when performing standing tasks such as stepping over obstacles.         PT Long Term Goals - 08/30/17 1323      PT LONG TERM GOAL #1   Title  Pt will have a decrease in R hip pain to 2/10 or less at worst to promote ability to ambulate, negotiate stairs, pick up items from the floor.     Baseline  4/10 R hip pain at worst for the past 2 months (03/28/2017); 5/10 for the past 7 days (05/10/17) and (05/29/17); 5/10 R hip pain at most for the past 7 days (06/28/2017); 2/10 R hip pain at most for the past 7 days.  Potentially acheived goal, 3/10 at most for the past 7 days due to sitting 1.5 hours at a Bible study, other than that, 1/10 pain at most for the past 7 days (08/23/2017)    Time  1    Period  Weeks    Status  Achieved      PT LONG TERM GOAL #2   Title  Pt will  improve R hip abduction and extension strength by at least  1/2 MMT grade to promote ability to ambulate with less R hip pain.    Baseline  On eval: R hip E: 4+/5, R hip Abd 4-/5; On 05/10/17 R hip E: 4+/5, R hip Abd 4-/5 and on (05/29/17); 4+/5 R hip extension, 4/5 R hip abduction (06/28/2017), (07/24/2017); Not tested today secondary to being only 1 week post op from R eye cataract surgery (08/21/2017)    Time  8    Period  Weeks    Status  Partially Met    Target Date  10/25/17      PT LONG TERM GOAL #3   Title  Pt will improve her LEFS score by at least 9 points as a demonstration of improved function.     Baseline  37/80 (03/28/2017); 38/80 on 05/10/17; 51/80    Time  3    Period  Weeks    Status  Achieved      PT LONG TERM GOAL #4   Title  Pt will report being able to tolerate walking at least 10 minutes to promote mobility.     Baseline  Pt reports walking 5 minutes bothers her R hip 03/28/2017; can walk 10-15 (05/10/17)     Time  6    Period  Weeks    Status  Achieved      PT LONG TERM GOAL #5   Title  Pt will be able to don and doff socks and shoes with 4/10 pain or less for improved QOL    Baseline  7/10 pain; 3/10    Time  3    Period  Weeks    Status  Achieved      Additional Long Term Goals   Additional Long Term Goals  Yes      PT LONG TERM GOAL #6   Title  Pt will improve her DGI score to at least 20/24 as a demonstration of decreased fall risk.     Baseline  18/24 (07/24/2017); 22/24 (08/21/2017)    Time  4    Period  Weeks    Status  Achieved      PT LONG TERM GOAL #7   Title  Pt will report improved ability to perform R hip flexion to step onto a step or over obstacles with less pain to help decrease fall risk and improve ability to perform standing tasks.     Baseline  Difficulty with R hip flexion to step onto a step or over obstacles due to pain (08/30/2017)    Time  8    Period  Weeks    Status  New    Target Date  10/25/17      PT LONG TERM GOAL #8   Title   Pt will report decreased occurences of R LE buckling while walking to promote mobility and decrease fall risk.     Baseline  Pt states R LE buckling (no falls) while walking (08/30/2017).     Time  8    Period  Weeks    Status  New    Target Date  10/25/17            Plan - 09/18/17 1927    Clinical Impression Statement  Imporved ability to perform R LE SLS with practice. Perfomed to promote ability to step over obstacles safely. Continued working on R hip joint mobility to promtoe ability to support herself on her R LE more comfortably when performing standing tasks such as stepping over obstacles.  Rehab Potential  Fair    Clinical Impairments Affecting Rehab Potential  Chronicity of condition    PT Frequency  2x / week    PT Duration  8 weeks    PT Treatment/Interventions  Therapeutic activities;Therapeutic exercise;Neuromuscular re-education;Aquatic Therapy;Electrical Stimulation;Iontophoresis 29m/ml Dexamethasone;Ultrasound;Gait training;Stair training;Patient/family education;Manual techniques;Dry needling;Traction traction if appropriate    PT Next Visit Plan  Balance related activities, hip ROM, glute strengthening    Consulted and Agree with Plan of Care  Patient       Patient will benefit from skilled therapeutic intervention in order to improve the following deficits and impairments:  Pain, Improper body mechanics, Difficulty walking, Decreased strength  Visit Diagnosis: Pain in right hip  Difficulty in walking, not elsewhere classified  History of falling     Problem List Patient Active Problem List   Diagnosis Date Noted  . Osteopenia of hip 09/04/2016  . History of bunionectomy of left great toe 07/15/2015  . H/O hammer toe correction 03/15/2015  . Arthritis of lumbar spine 01/19/2015  . Benign essential HTN 11/05/2014  . Carpal tunnel syndrome 11/05/2014  . Osteoarthritis 11/05/2014  . Dermatitis, eczematoid 11/05/2014  . Depression, major, recurrent,  mild ([facility] 11/05/2014  . Dyslipidemia 11/05/2014  . Gastro-esophageal reflux disease without esophagitis 11/05/2014  . Morbid obesity due to excess calories ([facility] 11/05/2014  . Dysmetabolic syndrome 030/17/2091 . Menopausal and perimenopausal disorder 11/05/2014  . Perennial allergic rhinitis with seasonal variation 11/05/2014  . Seborrhea capitis 11/05/2014  . Acquired trigger finger 11/05/2014  . Urinary incontinence in female 11/05/2014  . Vitamin D deficiency 11/05/2014  . Blood glucose elevated 08/31/2009   MJoneen BoersPT, DPT   09/18/2017, 7:30 PM  [facility] [address] [place] [address]Phone: [phone]  Fax:  [phone] Name: Monique BERKERYMRN: [id number] of Birth: 112/09/1950

## 2017-09-19 ENCOUNTER — Ambulatory Visit: Payer: Medicare Other

## 2017-09-19 DIAGNOSIS — Z9181 History of falling: Secondary | ICD-10-CM

## 2017-09-19 DIAGNOSIS — R262 Difficulty in walking, not elsewhere classified: Secondary | ICD-10-CM

## 2017-09-19 DIAGNOSIS — M25551 Pain in right hip: Secondary | ICD-10-CM | POA: Diagnosis not present

## 2017-09-19 NOTE — Therapy (Signed)
[facility] [address], [place], [address] Phone: [phone]   Fax:  [phone]  Physical Therapy Treatment  Patient Details  Name: Monique Baker MRN: [id number] Date of Birth: 03/28/1951 Referring Provider: Steele Sizer, MD   Encounter Date: 09/19/2017  PT End of Session - 09/19/17 1438    Visit Number  33    Number of Visits  60    Date for PT Re-Evaluation  10/25/17    PT Start Time  5397    PT Stop Time  1524    PT Time Calculation (min)  46 min    Activity Tolerance  Patient tolerated treatment well    Behavior During Therapy  [facility] for tasks assessed/performed       Past Medical History:  Diagnosis Date  . Allergy   . Depression   . GERD (gastroesophageal reflux disease)   . Hyperlipidemia   . Hypertension   . Internal hemorrhoids   . Menopausal disorder   . Osteoarthritis    hands  . Prediabetes   . Wears dentures    partial upper    Past Surgical History:  Procedure Laterality Date  . ABDOMINAL HYSTERECTOMY    . CESAREAN SECTION    . COLONOSCOPY WITH PROPOFOL N/A 03/15/2016   Procedure: COLONOSCOPY WITH PROPOFOL;  Surgeon: Christene Lye, MD;  Location: [facility];  Service: Endoscopy;  Laterality: N/A;  . HALLUX VALGUS LAPIDUS Left 06/16/2015   Procedure: HALLUX VALGUS LAPIDUS;  Surgeon: Samara Deist, DPM;  Location: [facility];  Service: Podiatry;  Laterality: Left;  WITH POPLITEAL  . HAMMER TOE SURGERY Left 06/16/2015   Procedure: HAMMER TOE CORRECTION SECOND TOE LEFT;  Surgeon: Samara Deist, DPM;  Location: [facility];  Service: Podiatry;  Laterality: Left;  prediabetic - on oral meds  . MYOMECTOMY    . trigger finger Right 12/15/2014  . Vulva cyst  11/2006    There were no vitals filed for this visit.  Subjective Assessment - 09/19/17 1439    Subjective  R hip is fairly well. No feelings of R LE buckling.  Was able to do stuff after last  session. Pt states she feels like she is walking a little better    Pertinent History  R hip osteoarthritis. A couple of years ago, pt would have soreness in her R posterior hip. Had x-rays and was told that she has a hairline fracture which is healing itself. Pain stopped hurting afterwards. Pain however started from her R posterior hip to anterior hip at the joint area. Went to another doctor and was told that she has arthritis. Sometimes feels pain R medial thigh but not past her knee. Feels like her R leg would give way at times but does not fall. Her doctor does not think that she needs surgery for her R hip.  Did a lot of fixing up around her house yesterday and was on her hip a lot which bothered her hip.   Denies back pain.  Denies unexplained changes in weight.  Pain does not wake her up at night.   Pt assumes that her R hip hairline fracture is healed.   Pt husband is a Theme park manager and feels like she is in the "lime light." Wants to be able to walk better so people do not have to ask her if she is ok as much.     Patient Stated Goals  Walk straight without limping.  Currently in Pain?  No/denies    Pain Score  0-No pain    Pain Onset  More than a month ago                              Objectives  Pt states leaving Sep 22, 2017 to [place] for a week, then going to [place] for a conference June 2 until Friday.  Decreased lateral lean with gait observed  [employer] Access Code: [email or web address]  Manual therapy  Supine inferior, inferiorlateral, lateral glide R hip (hip in hooklying position) with use of gait belt grade 3+ to promote joint mobility  Supine posterior glide R hip in90 degrees flexionwith lateral glide using strap grade 3+  Supine R hip flexion with inferior glide R hip using strap 10x3  Supine R hip IR with inferior glide R hip using strap 3x10   Therapeutic Exercise:   SLS R LE 5x 10 seconds.              Then 5x10 seconds with L  hip flexion              Then with L LE swing phase 5x10 seconds               Occasional L UE assist as needed  standing R LE leg press resisting double blue band with bilateral UE assist 10x2, then 5x   Forward step up onto Air Ex pad with R LE 10x  Then forward step up onto and over Air Ex pad with R LE 10x2 without UE assist to promote balance   Forward step over 8 inch cones with L LE (R LE weight bearing) 10x3 without UE assist to promote balance     Improved exercise technique, movement at target joints, use of target muscles after min to mod verbal, visual, tactile cues.    Good carry over of decreased R hip pain from yesterday's session with pt also better able to ambulate with less lateral lean during stance phase compared to yesterday. Continued working on R glute med and max muscle strengthening in conjucntion to joint mobilization to promote ability to weightbear on R LE while picking up L LE to negotiate obstacles to help decrease fall risk.  Pt tolerated session well without aggravation of symptoms.        PT Education - 09/19/17 1533    Education provided  Yes    Education Details  ther-ex    [employer]) Educated  Patient    Methods  Explanation;Demonstration;Tactile cues;Verbal cues    Comprehension  Returned demonstration;Verbalized understanding          PT Long Term Goals - 08/30/17 1323      PT LONG TERM GOAL #1   Title  Pt will have a decrease in R hip pain to 2/10 or less at worst to promote ability to ambulate, negotiate stairs, pick up items from the floor.     Baseline  4/10 R hip pain at worst for the past 2 months (03/28/2017); 5/10 for the past 7 days (05/10/17) and (05/29/17); 5/10 R hip pain at most for the past 7 days (06/28/2017); 2/10 R hip pain at most for the past 7 days.  Potentially acheived goal, 3/10 at most for the past 7 days due to sitting 1.5 hours at a Bible study, other than that, 1/10 pain at most for the past 7 days (08/23/2017)     Time  1    Period  Weeks    Status  Achieved      PT LONG TERM GOAL #2   Title  Pt will improve R hip abduction and extension strength by at least 1/2 MMT grade to promote ability to ambulate with less R hip pain.    Baseline  On eval: R hip E: 4+/5, R hip Abd 4-/5; On 05/10/17 R hip E: 4+/5, R hip Abd 4-/5 and on (05/29/17); 4+/5 R hip extension, 4/5 R hip abduction (06/28/2017), (07/24/2017); Not tested today secondary to being only 1 week post op from R eye cataract surgery (08/21/2017)    Time  8    Period  Weeks    Status  Partially Met    Target Date  10/25/17      PT LONG TERM GOAL #3   Title  Pt will improve her LEFS score by at least 9 points as a demonstration of improved function.     Baseline  37/80 (03/28/2017); 38/80 on 05/10/17; 51/80    Time  3    Period  Weeks    Status  Achieved      PT LONG TERM GOAL #4   Title  Pt will report being able to tolerate walking at least 10 minutes to promote mobility.     Baseline  Pt reports walking 5 minutes bothers her R hip 03/28/2017; can walk 10-15 (05/10/17)     Time  6    Period  Weeks    Status  Achieved      PT LONG TERM GOAL #5   Title  Pt will be able to don and doff socks and shoes with 4/10 pain or less for improved QOL    Baseline  7/10 pain; 3/10    Time  3    Period  Weeks    Status  Achieved      Additional Long Term Goals   Additional Long Term Goals  Yes      PT LONG TERM GOAL #6   Title  Pt will improve her DGI score to at least 20/24 as a demonstration of decreased fall risk.     Baseline  18/24 (07/24/2017); 22/24 (08/21/2017)    Time  4    Period  Weeks    Status  Achieved      PT LONG TERM GOAL #7   Title  Pt will report improved ability to perform R hip flexion to step onto a step or over obstacles with less pain to help decrease fall risk and improve ability to perform standing tasks.     Baseline  Difficulty with R hip flexion to step onto a step or over obstacles due to pain (08/30/2017)    Time  8     Period  Weeks    Status  New    Target Date  10/25/17      PT LONG TERM GOAL #8   Title  Pt will report decreased occurences of R LE buckling while walking to promote mobility and decrease fall risk.     Baseline  Pt states R LE buckling (no falls) while walking (08/30/2017).     Time  8    Period  Weeks    Status  New    Target Date  10/25/17            Plan - 09/19/17 1434    Clinical Impression Statement  Good carry over of decreased R hip pain from yesterday's session  with pt also better able to ambulate with less lateral lean during stance phase compared to yesterday. Continued working on R glute med and max muscle strengthening in conjucntion to joint mobilization to promote ability to weightbear on R LE while picking up L LE to negotiate obstacles to help decrease fall risk.  Pt tolerated session well without aggravation of symptoms.     Rehab Potential  Fair    Clinical Impairments Affecting Rehab Potential  Chronicity of condition    PT Frequency  2x / week    PT Duration  8 weeks    PT Treatment/Interventions  Therapeutic activities;Therapeutic exercise;Neuromuscular re-education;Aquatic Therapy;Electrical Stimulation;Iontophoresis 1m/ml Dexamethasone;Ultrasound;Gait training;Stair training;Patient/family education;Manual techniques;Dry needling;Traction traction if appropriate    PT Next Visit Plan  Balance related activities, hip ROM, glute strengthening    Consulted and Agree with Plan of Care  Patient       Patient will benefit from skilled therapeutic intervention in order to improve the following deficits and impairments:  Pain, Improper body mechanics, Difficulty walking, Decreased strength  Visit Diagnosis: Pain in right hip  Difficulty in walking, not elsewhere classified  History of falling     Problem List Patient Active Problem List   Diagnosis Date Noted  . Osteopenia of hip 09/04/2016  . History of bunionectomy of left great toe 07/15/2015  . H/O  hammer toe correction 03/15/2015  . Arthritis of lumbar spine 01/19/2015  . Benign essential HTN 11/05/2014  . Carpal tunnel syndrome 11/05/2014  . Osteoarthritis 11/05/2014  . Dermatitis, eczematoid 11/05/2014  . Depression, major, recurrent, mild ([facility] 11/05/2014  . Dyslipidemia 11/05/2014  . Gastro-esophageal reflux disease without esophagitis 11/05/2014  . Morbid obesity due to excess calories ([facility] 11/05/2014  . Dysmetabolic syndrome 011/94/1740 . Menopausal and perimenopausal disorder 11/05/2014  . Perennial allergic rhinitis with seasonal variation 11/05/2014  . Seborrhea capitis 11/05/2014  . Acquired trigger finger 11/05/2014  . Urinary incontinence in female 11/05/2014  . Vitamin D deficiency 11/05/2014  . Blood glucose elevated 08/31/2009    MJoneen BoersPT, DPT   09/19/2017, 3:42 PM  [facility] [address] [place] [address]Phone: [phone]  Fax:  [phone] Name: Monique GUMBSMRN: [id number] of Birth: [birth year]

## 2017-10-09 ENCOUNTER — Ambulatory Visit: Payer: Medicare Other | Attending: Family Medicine

## 2017-10-09 DIAGNOSIS — R262 Difficulty in walking, not elsewhere classified: Secondary | ICD-10-CM | POA: Insufficient documentation

## 2017-10-09 DIAGNOSIS — M25551 Pain in right hip: Secondary | ICD-10-CM | POA: Diagnosis not present

## 2017-10-09 DIAGNOSIS — Z9181 History of falling: Secondary | ICD-10-CM | POA: Diagnosis present

## 2017-10-09 NOTE — Therapy (Signed)
[facility] [address] [place], [address] Phone: [phone]   Fax:  [phone]  Physical Therapy Treatment  Patient Details  Name: Monique Baker MRN: [id number] Date of Birth: 01/04/1951 Referring Provider: Steele Sizer, MD   Encounter Date: 10/09/2017  PT End of Session - 10/09/17 1519    Visit Number  34    Number of Visits  60    Date for PT Re-Evaluation  10/25/17    PT Start Time  5643    PT Stop Time  1607    PT Time Calculation (min)  46 min    Activity Tolerance  Patient tolerated treatment well    Behavior During Therapy  [facility] for tasks assessed/performed       Past Medical History:  Diagnosis Date  . Allergy   . Depression   . GERD (gastroesophageal reflux disease)   . Hyperlipidemia   . Hypertension   . Internal hemorrhoids   . Menopausal disorder   . Osteoarthritis    hands  . Prediabetes   . Wears dentures    partial upper    Past Surgical History:  Procedure Laterality Date  . ABDOMINAL HYSTERECTOMY    . CESAREAN SECTION    . COLONOSCOPY WITH PROPOFOL N/A 03/15/2016   Procedure: COLONOSCOPY WITH PROPOFOL;  Surgeon: Christene Lye, MD;  Location: [facility];  Service: Endoscopy;  Laterality: N/A;  . HALLUX VALGUS LAPIDUS Left 06/16/2015   Procedure: HALLUX VALGUS LAPIDUS;  Surgeon: Samara Deist, DPM;  Location: [place];  Service: Podiatry;  Laterality: Left;  WITH POPLITEAL  . HAMMER TOE SURGERY Left 06/16/2015   Procedure: HAMMER TOE CORRECTION SECOND TOE LEFT;  Surgeon: Samara Deist, DPM;  Location: [place];  Service: Podiatry;  Laterality: Left;  prediabetic - on oral meds  . MYOMECTOMY    . trigger finger Right 12/15/2014  . Vulva cyst  11/2006    There were no vitals filed for this visit.  Subjective Assessment - 10/09/17 1522    Subjective  R hip is doing pretty good. Getting there. Does not have the pinch as much. Did very  well during her trip. Did a lot of walking. R hip bothered her when she was riding in the car for long periods during her road trip.  R hip did not feel like it was going to buckle as much. Only a few times. No R hip pain currently. Mostly stiff.  1/10 R hip pain at most for the past 7 days (also took meloxicam).     Pertinent History  R hip osteoarthritis. A couple of years ago, pt would have soreness in her R posterior hip. Had x-rays and was told that she has a hairline fracture which is healing itself. Pain stopped hurting afterwards. Pain however started from her R posterior hip to anterior hip at the joint area. Went to another doctor and was told that she has arthritis. Sometimes feels pain R medial thigh but not past her knee. Feels like her R leg would give way at times but does not fall. Her doctor does not think that she needs surgery for her R hip.  Did a lot of fixing up around her house yesterday and was on her hip a lot which bothered her hip.   Denies back pain.  Denies unexplained changes in weight.  Pain does not wake her up at night.   Pt assumes that her R hip hairline fracture  is healed.   Pt husband is a Theme park manager and feels like she is in the "lime light." Wants to be able to walk better so people do not have to ask her if she is ok as much.     Patient Stated Goals  Walk straight without limping.     Currently in Pain?  No/denies    Pain Score  0-No pain    Pain Onset  More than a month ago                               PT Education - 10/09/17 1550    Education provided  Yes    Education Details  ther-ex    [employer]) Educated  Patient    Methods  Explanation;Demonstration;Tactile cues;Verbal cues    Comprehension  Returned demonstration;Verbalized understanding         Objectives   Medbridge Access Code: [email or web address]   Manual therapy  Supine inferior, inferiorlateral, lateral glide R hip (hip in hooklying position) with use of gait belt  grade 3+ to promote joint mobility  Supine posterior glide R hip in90 degrees flexionwith lateral glide using strap grade 3+    Therapeutic Exercise: Supine R hip flexion with inferior glide R hip using strap 10x3  Supine R hip IR with inferior lateral glide R hip using strap 3x10  S/L R hip reverse clamshells 10x3  SLS R LE 5x 10 seconds.   Then with L LE swing phase 5x10 seconds for 2 sets  Occasional L UE assist as needed  standing R LE leg press resisting double blue band with bilateral UE assist 10x2, then 5x to promote rear end muscle strengthening  Forward step over 8 inch cones with L LE (R LE weight bearing) 10x3 without UE assist to promote balance   Improved exercise technique, movement at target joints, use of target muscles after min to mod verbal, visual, tactile cues.    Pt seems to be making progress with decreased R hip pain and decreased buckling sensation based on subjective report. Pt able to tolerate her trip with 1/10 R hip pain at most (with help of meloxicam from MD). Continued working on improving R hip mobility to decrease pinching sensation and R LE closed chain activities to promote hip strength, balance and decrease fall risk. Pt tolerated session well without aggravation of symptoms.          PT Long Term Goals - 08/30/17 1323      PT LONG TERM GOAL #1   Title  Pt will have a decrease in R hip pain to 2/10 or less at worst to promote ability to ambulate, negotiate stairs, pick up items from the floor.     Baseline  4/10 R hip pain at worst for the past 2 months (03/28/2017); 5/10 for the past 7 days (05/10/17) and (05/29/17); 5/10 R hip pain at most for the past 7 days (06/28/2017); 2/10 R hip pain at most for the past 7 days.  Potentially acheived goal, 3/10 at most for the past 7 days due to sitting 1.5 hours at a Bible study, other than that, 1/10 pain at most for the past 7 days (08/23/2017)    Time  1     Period  Weeks    Status  Achieved      PT LONG TERM GOAL #2   Title  Pt will improve R hip abduction and extension strength by at  least 1/2 MMT grade to promote ability to ambulate with less R hip pain.    Baseline  On eval: R hip E: 4+/5, R hip Abd 4-/5; On 05/10/17 R hip E: 4+/5, R hip Abd 4-/5 and on (05/29/17); 4+/5 R hip extension, 4/5 R hip abduction (06/28/2017), (07/24/2017); Not tested today secondary to being only 1 week post op from R eye cataract surgery (08/21/2017)    Time  8    Period  Weeks    Status  Partially Met    Target Date  10/25/17      PT LONG TERM GOAL #3   Title  Pt will improve her LEFS score by at least 9 points as a demonstration of improved function.     Baseline  37/80 (03/28/2017); 38/80 on 05/10/17; 51/80    Time  3    Period  Weeks    Status  Achieved      PT LONG TERM GOAL #4   Title  Pt will report being able to tolerate walking at least 10 minutes to promote mobility.     Baseline  Pt reports walking 5 minutes bothers her R hip 03/28/2017; can walk 10-15 (05/10/17)     Time  6    Period  Weeks    Status  Achieved      PT LONG TERM GOAL #5   Title  Pt will be able to don and doff socks and shoes with 4/10 pain or less for improved QOL    Baseline  7/10 pain; 3/10    Time  3    Period  Weeks    Status  Achieved      Additional Long Term Goals   Additional Long Term Goals  Yes      PT LONG TERM GOAL #6   Title  Pt will improve her DGI score to at least 20/24 as a demonstration of decreased fall risk.     Baseline  18/24 (07/24/2017); 22/24 (08/21/2017)    Time  4    Period  Weeks    Status  Achieved      PT LONG TERM GOAL #7   Title  Pt will report improved ability to perform R hip flexion to step onto a step or over obstacles with less pain to help decrease fall risk and improve ability to perform standing tasks.     Baseline  Difficulty with R hip flexion to step onto a step or over obstacles due to pain (08/30/2017)    Time  8    Period   Weeks    Status  New    Target Date  10/25/17      PT LONG TERM GOAL #8   Title  Pt will report decreased occurences of R LE buckling while walking to promote mobility and decrease fall risk.     Baseline  Pt states R LE buckling (no falls) while walking (08/30/2017).     Time  8    Period  Weeks    Status  New    Target Date  10/25/17            Plan - 10/09/17 1551    Clinical Impression Statement  Pt seems to be making progress with decreased R hip pain and decreased buckling sensation based on subjective report. Pt able to tolerate her trip with 1/10 R hip pain at most (with help of meloxicam from MD). Continued working on improving R hip mobility to decrease pinching sensation  and R LE closed chain activities to promote hip strength, balance and decrease fall risk. Pt tolerated session well without aggravation of symptoms.      Rehab Potential  Fair    Clinical Impairments Affecting Rehab Potential  Chronicity of condition    PT Frequency  2x / week    PT Duration  8 weeks    PT Treatment/Interventions  Therapeutic activities;Therapeutic exercise;Neuromuscular re-education;Aquatic Therapy;Electrical Stimulation;Iontophoresis 48m/ml Dexamethasone;Ultrasound;Gait training;Stair training;Patient/family education;Manual techniques;Dry needling;Traction traction if appropriate    PT Next Visit Plan  Balance related activities, hip ROM, glute strengthening    Consulted and Agree with Plan of Care  Patient       Patient will benefit from skilled therapeutic intervention in order to improve the following deficits and impairments:  Pain, Improper body mechanics, Difficulty walking, Decreased strength  Visit Diagnosis: Pain in right hip  Difficulty in walking, not elsewhere classified  History of falling     Problem List Patient Active Problem List   Diagnosis Date Noted  . Osteopenia of hip 09/04/2016  . History of bunionectomy of left great toe 07/15/2015  . H/O hammer toe  correction 03/15/2015  . Arthritis of lumbar spine 01/19/2015  . Benign essential HTN 11/05/2014  . Carpal tunnel syndrome 11/05/2014  . Osteoarthritis 11/05/2014  . Dermatitis, eczematoid 11/05/2014  . Depression, major, recurrent, mild ([place] 11/05/2014  . Dyslipidemia 11/05/2014  . Gastro-esophageal reflux disease without esophagitis 11/05/2014  . Morbid obesity due to excess calories ([place] 11/05/2014  . Dysmetabolic syndrome 032/41/9914 . Menopausal and perimenopausal disorder 11/05/2014  . Perennial allergic rhinitis with seasonal variation 11/05/2014  . Seborrhea capitis 11/05/2014  . Acquired trigger finger 11/05/2014  . Urinary incontinence in female 11/05/2014  . Vitamin D deficiency 11/05/2014  . Blood glucose elevated 08/31/2009    MJoneen BoersPT, DPT   10/09/2017, 4:17 PM  [facility] [address] [place] [address]Phone: [phone]  Fax:  [phone] Name: LSHAWNTEL FARNWORTHMRN: [id number] of Birth: 1Nov 09, 1952

## 2017-10-11 ENCOUNTER — Ambulatory Visit: Payer: Medicare Other

## 2017-10-11 DIAGNOSIS — M25551 Pain in right hip: Secondary | ICD-10-CM

## 2017-10-11 DIAGNOSIS — Z9181 History of falling: Secondary | ICD-10-CM

## 2017-10-11 DIAGNOSIS — R262 Difficulty in walking, not elsewhere classified: Secondary | ICD-10-CM

## 2017-10-11 NOTE — Therapy (Signed)
[facility] [address], [place], [address] Phone: [phone]   Fax:  [phone]  Physical Therapy Treatment  Patient Details  Name: Monique Baker MRN: [id number] Date of Birth: March 23, 1951 Referring Provider: Steele Sizer, MD   Encounter Date: 10/11/2017  PT End of Session - 10/11/17 0906    Visit Number  35    Number of Visits  60    Date for PT Re-Evaluation  10/25/17    PT Start Time  0906    PT Stop Time  0946    PT Time Calculation (min)  40 min    Activity Tolerance  Patient tolerated treatment well    Behavior During Therapy  [facility] for tasks assessed/performed       Past Medical History:  Diagnosis Date  . Allergy   . Depression   . GERD (gastroesophageal reflux disease)   . Hyperlipidemia   . Hypertension   . Internal hemorrhoids   . Menopausal disorder   . Osteoarthritis    hands  . Prediabetes   . Wears dentures    partial upper    Past Surgical History:  Procedure Laterality Date  . ABDOMINAL HYSTERECTOMY    . CESAREAN SECTION    . COLONOSCOPY WITH PROPOFOL N/A 03/15/2016   Procedure: COLONOSCOPY WITH PROPOFOL;  Surgeon: Christene Lye, MD;  Location: [facility];  Service: Endoscopy;  Laterality: N/A;  . HALLUX VALGUS LAPIDUS Left 06/16/2015   Procedure: HALLUX VALGUS LAPIDUS;  Surgeon: Samara Deist, DPM;  Location: [place];  Service: Podiatry;  Laterality: Left;  WITH POPLITEAL  . HAMMER TOE SURGERY Left 06/16/2015   Procedure: HAMMER TOE CORRECTION SECOND TOE LEFT;  Surgeon: Samara Deist, DPM;  Location: [place];  Service: Podiatry;  Laterality: Left;  prediabetic - on oral meds  . MYOMECTOMY    . trigger finger Right 12/15/2014  . Vulva cyst  11/2006    There were no vitals filed for this visit.  Subjective Assessment - 10/11/17 0907    Subjective  R hip is fairly well. A little disomfort when she walks early in the morning, once she  gets it going its ok. 1/10 discomfort when walking.     Pertinent History  R hip osteoarthritis. A couple of years ago, pt would have soreness in her R posterior hip. Had x-rays and was told that she has a hairline fracture which is healing itself. Pain stopped hurting afterwards. Pain however started from her R posterior hip to anterior hip at the joint area. Went to another doctor and was told that she has arthritis. Sometimes feels pain R medial thigh but not past her knee. Feels like her R leg would give way at times but does not fall. Her doctor does not think that she needs surgery for her R hip.  Did a lot of fixing up around her house yesterday and was on her hip a lot which bothered her hip.   Denies back pain.  Denies unexplained changes in weight.  Pain does not wake her up at night.   Pt assumes that her R hip hairline fracture is healed.   Pt husband is a Theme park manager and feels like she is in the "lime light." Wants to be able to walk better so people do not have to ask her if she is ok as much.     Patient Stated Goals  Walk straight without limping.     Currently in Pain?  Yes    Pain Score  1  discomfort when walking    Pain Onset  More than a month ago                               PT Education - 10/11/17 0916    Education provided  Yes    Education Details  ther-ex    [employer]) Educated  Patient    Methods  Demonstration;Explanation;Tactile cues;Verbal cues    Comprehension  Returned demonstration;Verbalized understanding         Objectives   [employer] Access Code: [email or web address]       Therapeutic Exercise:   standing R LE leg press resisting double blue band with bilateral UE assist 10x3 to promote rear end muscle strengthening  Side stepping red band around distal thighs 32 ft to the R and 32 ft to the L, then 15 ft each direction to promote glute med muscle strengthening   Forward step over 8 inch cones with L LE (R LE weight bearing)  10x2 without UE assist to promote balance  Then 10x while holding onto 5 lbs weight each hand (10 lbs total)  SLS on R LE with L foot toe taps onto 8 inch cones (3 to the side) 5x2  Pt states R hip feels stronger  Forward step up onto Dyna disc with R LE with one UE to no UE assist 10x3  Lateral step up onto dyna disc with R LE and one UE assist, L foot does not touch dyna disc to promote more R hip muscle use  Standing low rows to promote trunk strengthening, lumbopelvic control and to decrease lateral lean while walking  Green band 10x2 with 5 second holds   Straight pallof press resisting double green band 10x   Improved exercise technique, movement at target joints, use of target muscles after min to mod verbal, visual, tactile cues.   Continued working on glute med and max strengthening as well as stabilizing muscles of the R hip to promote balance and help decrease pain. Pt tolerated session well without aggravation of symptoms.      PT Long Term Goals - 08/30/17 1323      PT LONG TERM GOAL #1   Title  Pt will have a decrease in R hip pain to 2/10 or less at worst to promote ability to ambulate, negotiate stairs, pick up items from the floor.     Baseline  4/10 R hip pain at worst for the past 2 months (03/28/2017); 5/10 for the past 7 days (05/10/17) and (05/29/17); 5/10 R hip pain at most for the past 7 days (06/28/2017); 2/10 R hip pain at most for the past 7 days.  Potentially acheived goal, 3/10 at most for the past 7 days due to sitting 1.5 hours at a Bible study, other than that, 1/10 pain at most for the past 7 days (08/23/2017)    Time  1    Period  Weeks    Status  Achieved      PT LONG TERM GOAL #2   Title  Pt will improve R hip abduction and extension strength by at least 1/2 MMT grade to promote ability to ambulate with less R hip pain.    Baseline  On eval: R hip E: 4+/5, R hip Abd 4-/5; On 05/10/17 R hip E: 4+/5, R hip Abd 4-/5 and on (05/29/17); 4+/5 R hip extension,  4/5 R hip  abduction (06/28/2017), (07/24/2017); Not tested today secondary to being only 1 week post op from R eye cataract surgery (08/21/2017)    Time  8    Period  Weeks    Status  Partially Met    Target Date  10/25/17      PT LONG TERM GOAL #3   Title  Pt will improve her LEFS score by at least 9 points as a demonstration of improved function.     Baseline  37/80 (03/28/2017); 38/80 on 05/10/17; 51/80    Time  3    Period  Weeks    Status  Achieved      PT LONG TERM GOAL #4   Title  Pt will report being able to tolerate walking at least 10 minutes to promote mobility.     Baseline  Pt reports walking 5 minutes bothers her R hip 03/28/2017; can walk 10-15 (05/10/17)     Time  6    Period  Weeks    Status  Achieved      PT LONG TERM GOAL #5   Title  Pt will be able to don and doff socks and shoes with 4/10 pain or less for improved QOL    Baseline  7/10 pain; 3/10    Time  3    Period  Weeks    Status  Achieved      Additional Long Term Goals   Additional Long Term Goals  Yes      PT LONG TERM GOAL #6   Title  Pt will improve her DGI score to at least 20/24 as a demonstration of decreased fall risk.     Baseline  18/24 (07/24/2017); 22/24 (08/21/2017)    Time  4    Period  Weeks    Status  Achieved      PT LONG TERM GOAL #7   Title  Pt will report improved ability to perform R hip flexion to step onto a step or over obstacles with less pain to help decrease fall risk and improve ability to perform standing tasks.     Baseline  Difficulty with R hip flexion to step onto a step or over obstacles due to pain (08/30/2017)    Time  8    Period  Weeks    Status  New    Target Date  10/25/17      PT LONG TERM GOAL #8   Title  Pt will report decreased occurences of R LE buckling while walking to promote mobility and decrease fall risk.     Baseline  Pt states R LE buckling (no falls) while walking (08/30/2017).     Time  8    Period  Weeks    Status  New    Target Date  10/25/17             Plan - 10/11/17 0916    Clinical Impression Statement  Continued working on glute med and max strengthening as well as stabilizing muscles of the R hip to promote balance and help decrease pain. Pt tolerated session well without aggravation of symptoms.     Rehab Potential  Fair    Clinical Impairments Affecting Rehab Potential  Chronicity of condition    PT Frequency  2x / week    PT Duration  8 weeks    PT Treatment/Interventions  Therapeutic activities;Therapeutic exercise;Neuromuscular re-education;Aquatic Therapy;Electrical Stimulation;Iontophoresis 10m/ml Dexamethasone;Ultrasound;Gait training;Stair training;Patient/family education;Manual techniques;Dry needling;Traction traction if appropriate    PT Next Visit Plan  Balance related activities, hip ROM, glute strengthening    Consulted and Agree with Plan of Care  Patient       Patient will benefit from skilled therapeutic intervention in order to improve the following deficits and impairments:  Pain, Improper body mechanics, Difficulty walking, Decreased strength  Visit Diagnosis: Pain in right hip  Difficulty in walking, not elsewhere classified  History of falling     Problem List Patient Active Problem List   Diagnosis Date Noted  . Osteopenia of hip 09/04/2016  . History of bunionectomy of left great toe 07/15/2015  . H/O hammer toe correction 03/15/2015  . Arthritis of lumbar spine 01/19/2015  . Benign essential HTN 11/05/2014  . Carpal tunnel syndrome 11/05/2014  . Osteoarthritis 11/05/2014  . Dermatitis, eczematoid 11/05/2014  . Depression, major, recurrent, mild ([place]) 11/05/2014  . Dyslipidemia 11/05/2014  . Gastro-esophageal reflux disease without esophagitis 11/05/2014  . Morbid obesity due to excess calories ([place]) 11/05/2014  . Dysmetabolic syndrome 29/56/2130  . Menopausal and perimenopausal disorder 11/05/2014  . Perennial allergic rhinitis with seasonal variation 11/05/2014  .  Seborrhea capitis 11/05/2014  . Acquired trigger finger 11/05/2014  . Urinary incontinence in female 11/05/2014  . Vitamin D deficiency 11/05/2014  . Blood glucose elevated 08/31/2009   Joneen Boers PT, DPT   10/11/2017, 7:40 PM  [facility] [address] [place], [address] Phone: [phone]   Fax:  [phone]  Name: Monique Baker MRN: [id number] Date of Birth: [birth year]

## 2017-10-12 ENCOUNTER — Ambulatory Visit (INDEPENDENT_AMBULATORY_CARE_PROVIDER_SITE_OTHER): Payer: Medicare Other

## 2017-10-12 VITALS — BP 124/70 | HR 61 | Temp 98.3°F | Resp 12 | Ht 62.0 in | Wt 197.3 lb

## 2017-10-12 DIAGNOSIS — Z0000 Encounter for general adult medical examination without abnormal findings: Secondary | ICD-10-CM | POA: Diagnosis not present

## 2017-10-12 NOTE — Patient Instructions (Signed)
Ms. Monique Baker , Thank you for taking time to come for your Medicare Wellness Visit. I appreciate your ongoing commitment to your health goals. Please review the following plan we discussed and let me know if I can assist you in the future.   Screening recommendations/referrals: Colorectal Screening: Up to date Mammogram: Up to date Bone Density: Up to date Lung Cancer Screening: You do not qualify for this screening Hepatitis C Screening: Up to date  Vision and Dental Exams: Recommended annual ophthalmology exams for early detection of glaucoma and other disorders of the eye Recommended annual dental exams for proper oral hygiene  Vaccinations: Influenza vaccine: Up to date Pneumococcal vaccine: Up to date Tdap vaccine: Up to date Shingles vaccine: Please call your insurance company to determine your out of pocket expense for the Shingrix vaccine. You may also receive this vaccine at your local pharmacy or Health Dept.  Advanced directives: Advance directive discussed with you today. I have provided a copy for you to complete at home and have notarized. Once this is complete please bring a copy in to our office so we can scan it into your chart.  Conditions/risks identified: Recommend to drink at least 6-8 8oz glasses of water per day.  Next appointment: Please schedule your Annual Wellness Visit with your Nurse Health Advisor in one year.  Preventive Care 6365 Years and Older, Female Preventive care refers to lifestyle choices and visits with your health care provider that can promote health and wellness. What does preventive care include?  A yearly physical exam. This is also called an annual well check.  Dental exams once or twice a year.  Routine eye exams. Ask your health care provider how often you should have your eyes checked.  Personal lifestyle choices, including:  Daily care of your teeth and gums.  Regular physical activity.  Eating a healthy diet.  Avoiding tobacco  and drug use.  Limiting alcohol use.  Practicing safe sex.  Taking low-dose aspirin every day.  Taking vitamin and mineral supplements as recommended by your health care provider. What happens during an annual well check? The services and screenings done by your health care provider during your annual well check will depend on your age, overall health, lifestyle risk factors, and family history of disease. Counseling  Your health care provider may ask you questions about your:  Alcohol use.  Tobacco use.  Drug use.  Emotional well-being.  Home and relationship well-being.  Sexual activity.  Eating habits.  History of falls.  Memory and ability to understand (cognition).  Work and work Astronomerenvironment.  Reproductive health. Screening  You may have the following tests or measurements:  Height, weight, and BMI.  Blood pressure.  Lipid and cholesterol levels. These may be checked every 5 years, or more frequently if you are over 67 years old.  Skin check.  Lung cancer screening. You may have this screening every year starting at age 67 if you have a 30-pack-year history of smoking and currently smoke or have quit within the past 15 years.  Fecal occult blood test (FOBT) of the stool. You may have this test every year starting at age 67.  Flexible sigmoidoscopy or colonoscopy. You may have a sigmoidoscopy every 5 years or a colonoscopy every 10 years starting at age 67.  Hepatitis C blood test.  Hepatitis B blood test.  Sexually transmitted disease (STD) testing.  Diabetes screening. This is done by checking your blood sugar (glucose) after you have not eaten for a  while (fasting). You may have this done every 1-3 years.  Bone density scan. This is done to screen for osteoporosis. You may have this done starting at age 25.  Mammogram. This may be done every 1-2 years. Talk to your health care provider about how often you should have regular mammograms. Talk with  your health care provider about your test results, treatment options, and if necessary, the need for more tests. Vaccines  Your health care provider may recommend certain vaccines, such as:  Influenza vaccine. This is recommended every year.  Tetanus, diphtheria, and acellular pertussis (Tdap, Td) vaccine. You may need a Td booster every 10 years.  Zoster vaccine. You may need this after age 62.  Pneumococcal 13-valent conjugate (PCV13) vaccine. One dose is recommended after age 67.  Pneumococcal polysaccharide (PPSV23) vaccine. One dose is recommended after age 67. Talk to your health care provider about which screenings and vaccines you need and how often you need them. This information is not intended to replace advice given to you by your health care provider. Make sure you discuss any questions you have with your health care provider. Document Released: 05/14/2015 Document Revised: 01/05/2016 Document Reviewed: 02/16/2015 Elsevier Interactive Patient Education  2017 [employer].  Fall Prevention in the Home Falls can cause injuries. They can happen to people of all ages. There are many things you can do to make your home safe and to help prevent falls. What can I do on the outside of my home?  Regularly fix the edges of walkways and driveways and fix any cracks.  Remove anything that might make you trip as you walk through a door, such as a raised step or threshold.  Trim any bushes or trees on the path to your home.  Use bright outdoor lighting.  Clear any walking paths of anything that might make someone trip, such as rocks or tools.  Regularly check to see if handrails are loose or broken. Make sure that both sides of any steps have handrails.  Any raised decks and porches should have guardrails on the edges.  Have any leaves, snow, or ice cleared regularly.  Use sand or salt on walking paths during winter.  Clean up any spills in your garage right away. This  includes oil or grease spills. What can I do in the bathroom?  Use night lights.  Install grab bars by the toilet and in the tub and shower. Do not use towel bars as grab bars.  Use non-skid mats or decals in the tub or shower.  If you need to sit down in the shower, use a plastic, non-slip stool.  Keep the floor dry. Clean up any water that spills on the floor as soon as it happens.  Remove soap buildup in the tub or shower regularly.  Attach bath mats securely with double-sided non-slip rug tape.  Do not have throw rugs and other things on the floor that can make you trip. What can I do in the bedroom?  Use night lights.  Make sure that you have a light by your bed that is easy to reach.  Do not use any sheets or blankets that are too big for your bed. They should not hang down onto the floor.  Have a firm chair that has side arms. You can use this for support while you get dressed.  Do not have throw rugs and other things on the floor that can make you trip. What can I do in the  kitchen?  Clean up any spills right away.  Avoid walking on wet floors.  Keep items that you use a lot in easy-to-reach places.  If you need to reach something above you, use a strong step stool that has a grab bar.  Keep electrical cords out of the way.  Do not use floor polish or wax that makes floors slippery. If you must use wax, use non-skid floor wax.  Do not have throw rugs and other things on the floor that can make you trip. What can I do with my stairs?  Do not leave any items on the stairs.  Make sure that there are handrails on both sides of the stairs and use them. Fix handrails that are broken or loose. Make sure that handrails are as long as the stairways.  Check any carpeting to make sure that it is firmly attached to the stairs. Fix any carpet that is loose or worn.  Avoid having throw rugs at the top or bottom of the stairs. If you do have throw rugs, attach them to the  floor with carpet tape.  Make sure that you have a light switch at the top of the stairs and the bottom of the stairs. If you do not have them, ask someone to add them for you. What else can I do to help prevent falls?  Wear shoes that:  Do not have high heels.  Have rubber bottoms.  Are comfortable and fit you well.  Are closed at the toe. Do not wear sandals.  If you use a stepladder:  Make sure that it is fully opened. Do not climb a closed stepladder.  Make sure that both sides of the stepladder are locked into place.  Ask someone to hold it for you, if possible.  Clearly mark and make sure that you can see:  Any grab bars or handrails.  First and last steps.  Where the edge of each step is.  Use tools that help you move around (mobility aids) if they are needed. These include:  Canes.  Walkers.  Scooters.  Crutches.  Turn on the lights when you go into a dark area. Replace any light bulbs as soon as they burn out.  Set up your furniture so you have a clear path. Avoid moving your furniture around.  If any of your floors are uneven, fix them.  If there are any pets around you, be aware of where they are.  Review your medicines with your doctor. Some medicines can make you feel dizzy. This can increase your chance of falling. Ask your doctor what other things that you can do to help prevent falls. This information is not intended to replace advice given to you by your health care provider. Make sure you discuss any questions you have with your health care provider. Document Released: 02/11/2009 Document Revised: 09/23/2015 Document Reviewed: 05/22/2014 Elsevier Interactive Patient Education  2017 [employer].

## 2017-10-12 NOTE — Progress Notes (Addendum)
Subjective:   Monique Baker is a 67 y.o. female who presents for an Initial Medicare Annual Wellness Visit.  Review of Systems    N/A  Cardiac Risk Factors include: advanced age (>38men, >39 women);dyslipidemia;hypertension;obesity (BMI >30kg/m2);sedentary lifestyle     Objective:    Today's Vitals   10/12/17 0950  BP: 124/70  Pulse: 61  Resp: 12  Temp: 98.3 F (36.8 C)  TempSrc: Oral  SpO2: 97%  Weight: 197 lb 4.8 oz (89.5 kg)  Height: 5\' 2"  (1.575 m)   Body mass index is 36.09 kg/m.  Advanced Directives 10/12/2017 03/28/2017 01/18/2017 11/14/2016 08/10/2016 06/08/2016 03/15/2016  Does Patient Have a Medical Advance Directive? No No No No No No No  Would patient like information on creating a medical advance directive? Yes (MAU/Ambulatory/Procedural Areas - Information given) No - Patient declined - - - - No - patient declined information    Current Medications (verified) Outpatient Encounter Medications as of 10/12/2017  Medication Sig  . acetaminophen (TYLENOL) 500 MG tablet Take by mouth. Reported on 11/15/2015  . atorvastatin (LIPITOR) 40 MG tablet Take 1 tablet (40 mg total) by mouth every evening. for cholesterol  . buPROPion (WELLBUTRIN XL) 150 MG 24 hr tablet Take 1 tablet (150 mg total) by mouth daily before breakfast.  . Cholecalciferol (VITAMIN D) 2000 units CAPS Take 1 capsule by mouth 2 (two) times a week.   . citalopram (CELEXA) 20 MG tablet Take 1 tablet (20 mg total) by mouth daily.  . Cyanocobalamin (B-12) 1000 MCG SUBL Place 1 each under the tongue once a week.   . fluticasone (FLONASE) 50 MCG/ACT nasal spray Place 2 sprays into both nostrils daily.  Marland Kitchen glucose blood (ONETOUCH VERIO) test strip Use as instructed  . loratadine (CLARITIN) 10 MG tablet TAKE 1 TABLET (10 MG TOTAL) BY MOUTH DAILY.  . meloxicam (MOBIC) 15 MG tablet Take 1 tablet (15 mg total) by mouth daily.  . metFORMIN (GLUCOPHAGE) 500 MG tablet Take 1 tablet (500 mg total) by mouth 2 (two) times  daily.  . nadolol (CORGARD) 20 MG tablet Take 1 tablet (20 mg total) by mouth every evening.  . Olmesartan-amLODIPine-HCTZ 20-5-12.5 MG TABS Take 1 tablet by mouth daily.  Marland Kitchen triamcinolone cream (KENALOG) 0.1 % Reported on 07/15/2015  . silver sulfADIAZINE (SILVADENE) 1 % cream Apply 1 application topically daily. (Patient not taking: Reported on 10/12/2017)   No facility-administered encounter medications on file as of 10/12/2017.     Allergies (verified) Duloxetine   History: Past Medical History:  Diagnosis Date  . Allergy   . Depression   . GERD (gastroesophageal reflux disease)   . Hyperlipidemia   . Hypertension   . Internal hemorrhoids   . Menopausal disorder   . Osteoarthritis    hands  . Prediabetes   . Wears dentures    partial upper   Past Surgical History:  Procedure Laterality Date  . ABDOMINAL HYSTERECTOMY    . CATARACT EXTRACTION, BILATERAL    . CESAREAN SECTION    . COLONOSCOPY WITH PROPOFOL N/A 03/15/2016   Procedure: COLONOSCOPY WITH PROPOFOL;  Surgeon: Kieth Brightly, MD;  Location: [facility] END[facility]COPY;  Service: Endoscopy;  Laterality: N/A;  . HALLUX VALGUS LAPIDUS Left 06/16/2015   Procedure: HALLUX VALGUS LAPIDUS;  Surgeon: Gwyneth Revels, DPM;  Location[facility];  Service: Podiatry;  Laterality: Left;  WITH POPLITEAL  . HAMMER TOE SURGERY Left 06/16/2015   Procedure: HAMMER TOE CORRECTION SECOND TOE LEFT;  Surgeon: Gwyneth Revels, DPM;  Location: [facility];  Service: Podiatry;  Laterality: Left;  prediabetic - on oral meds  . MYOMECTOMY    . trigger finger Right 12/15/2014  . Vulva cyst  11/2006   Family History  Problem Relation Age of Onset  . Dementia Mother   . Diabetes Father   . Stroke Father   . Cancer Daughter        Fibroid Tumors  . Diabetes Brother   . Breast cancer Neg Hx    Social History   Socioeconomic History  . Marital status: Married    Spouse name: Adela Glimpse  . Number of children: 1  . Years of  education: Not on file  . Highest education level: Bachelor's degree (e.g., BA, AB, BS)  Occupational History  . Occupation: retired Runner, broadcasting/film/video    Comment: Hospital doctor  . Financial resource strain: Not hard at all  . Food insecurity:    Worry: Never true    Inability: Never true  . Transportation needs:    Medical: No    Non-medical: No  Tobacco Use  . Smoking status: Never Smoker  . Smokeless tobacco: Never Used  . Tobacco comment: smoking cessation materials not required  Substance and Sexual Activity  . Alcohol use: No    Alcohol/week: 0.0 oz  . Drug use: No  . Sexual activity: Not Currently    Partners: Male  Lifestyle  . Physical activity:    Days per week: 0 days    Minutes per session: 0 min  . Stress: Rather much  Relationships  . Social connections:    Talks on phone: Patient refused    Gets together: Patient refused    Attends religious service: Patient refused    Active member of club or organization: Patient refused    Attends meetings of clubs or organizations: Patient refused    Relationship status: Married  Other Topics Concern  . Not on file  Social History Narrative   Married to a Education officer, environmental and he is a professor at [employer] Armed forces training and education officer )     Tobacco Counseling Counseling given: No Comment: smoking cessation materials not required  Clinical Intake:  Pre-visit preparation completed: Yes  Pain : No/denies pain   BMI - recorded: 36.09 Nutritional Status: BMI > 30  Obese Nutritional Risks: None Diabetes: No  How often do you need to have someone help you when you read instructions, pamphlets, or other written materials from your doctor or pharmacy?: 1 - Never  Interpreter Needed?: No  Information entered by :: AEversole, LPN   Activities of Daily Living In your present state of health, do you have any difficulty performing the following activities: 10/12/2017 07/23/2017  Hearing? N N  Comment denies hearing aids -  Vision? N N    Comment wears eyeglasses -  Difficulty concentrating or making decisions? N N  Walking or climbing stairs? Y N  Comment joint pain -  Dressing or bathing? N N  Doing errands, shopping? N N  Preparing Food and eating ? N -  Comment upper partial dentures -  Using the Toilet? N -  In the past six months, have you accidently leaked urine? N -  Do you have problems with loss of bowel control? N -  Managing your Medications? N -  Managing your Finances? N -  Housekeeping or managing your Housekeeping? N -  Some recent data might be hidden     Immunizations and Health Maintenance Immunization History  Administered Date(s) Administered  .  Influenza, High Dose Seasonal PF 01/18/2017  . Influenza,inj,Quad PF,6+ Mos 01/11/2015, 01/17/2016  . Influenza-Unspecified 01/06/2014  . Pneumococcal Conjugate-13 07/23/2017  . Pneumococcal Polysaccharide-23 08/31/2009  . Tdap 05/10/2010, 07/23/2017  . Zoster 12/28/2011   There are no preventive care reminders to display for this patient.  Patient Care Team: Alba Cory, MD as PCP - General (Family Medicine) Kieth Brightly, MD (General Surgery) Deeann Saint, MD as Consulting Physician (Orthopedic Surgery) Alwyn Pea, MD as Consulting Physician (Cardiology)  Indicate any recent Medical Services you may have received from other than [facility] providers in the past year (date may be approximate).     Assessment:   This is a routine wellness examination for [place].  Hearing/Vision screen Vision Screening Comments: Sees Dr. Ames Dura for annual eye exams  Dietary issues and exercise activities discussed: Current Exercise Habits: The patient does not participate in regular exercise at present, Exercise limited by: orthopedic condition(s)(underwent recent R hip replacement)  Goals    . DIET     Recommend to decrease portion sizes by eating 3 small healthy meals and at least 2 healthy snacks per day.    Marland Kitchen DIET - INCREASE  WATER INTAKE     Recommend to drink at least 6-8 8oz glasses of water per day.      Depression Screen PHQ 2/9 Scores 10/12/2017 07/23/2017 03/19/2017 01/18/2017 11/14/2016 08/10/2016 06/08/2016  PHQ - 2 Score 6 3 0 0 0 0 0  PHQ- 9 Score 10 5 - - - - -    Fall Risk Fall Risk  10/12/2017 07/23/2017 07/23/2017 03/19/2017 01/18/2017  Falls in the past year? Yes - Yes Yes Yes  Comment - - - - -  Number falls in past yr: 1 - 2 or more 1 1  Injury with Fall? No - Yes Yes Yes  Comment - - - - pt hit head  Risk for fall due to : Impaired vision;Medication side effect;Impaired balance/gait History of fall(s);Impaired balance/gait - - Other (Comment)  Risk for fall due to: Comment wears eyeglasses; recent R hip replacement - - - patient fell in tub at hotel while out of town   Follow up Falls evaluation completed;Education provided;Falls prevention discussed Education provided;Falls prevention discussed Education provided - Falls evaluation completed    FALL RISK PREVENTION PERTAINING TO HOME: Is your home free of loose throw rugs in walkways, pet beds, electrical cords, etc? Yes Is there adequate lighting in your home to reduce risk of falls?  Yes Are there stairs in or around your home WITH handrails? Yes  ASSISTIVE DEVICES UTILIZED TO PREVENT FALLS: Use of a cane, walker or w/c? No Grab bars in the bathroom? Yes  Shower chair or a place to sit while bathing? Yes An elevated toilet seat or a handicapped toilet? Yes  Timed Get Up and Go Performed: Yes. Pt ambulated 10 feet within 29 sec. Gait slow, steady and without the use of an assistive device. No intervention required at this time. Fall risk prevention has been discussed.  Community Resource Referral:  [employer] Referral not required at this time.  Cognitive Function:     6CIT Screen 10/12/2017  What Year? 0 points  What month? 0 points  What time? 0 points  Count back from 20 0 points  Months in reverse 0 points  Repeat  phrase 0 points  Total Score 0    Screening Tests Health Maintenance  Topic Date Due  . INFLUENZA VACCINE  11/29/2017  . MAMMOGRAM  03/30/2018  .  COLONOSCOPY  03/15/2026  . TETANUS/TDAP  07/24/2027  . DEXA SCAN  Completed  . Hepatitis C Screening  Completed  . PNA vac Low Risk Adult  Completed    Qualifies for Shingles Vaccine? Yes. Zostavax completed 12/28/11. Due for Shingrix. Education has been provided regarding the importance of this vaccine. Pt has been advised to call her insurance company to determine her out of pocket expense. Advised she may also receive this vaccine at her local pharmacy or Health Dept. Verbalized acceptance and understanding.  Cancer Screenings: Lung: Low Dose CT Chest recommended if Age 45-80 years, 30 pack-year currently smoking OR have quit w/in 15years. Patient does not qualify. Breast: Up to date on Mammogram? Yes. Completed 03/30/17. Repeat every year   Up to date of Bone Density/Dexa? Yes. Completed 09/04/16. Osteoporotic screenings no longer required Colorectal: Completed 03/15/16. Repeat every 10 years  Additional Screenings: Hepatitis C Screening: Completed 12/28/11   Plan:  I have personally reviewed and addressed the Medicare Annual Wellness questionnaire and have noted the following in the patient's chart:  A. Medical and social history B. Use of alcohol, tobacco or illicit drugs  C. Current medications and supplements D. Functional ability and status E.  Nutritional status F.  Physical activity G. Advance directives H. List of other physicians I.  Hospitalizations, surgeries, and ER visits in previous 12 months J.  Vitals K. Screenings such as hearing and vision if needed, cognitive and depression L. Referrals and appointments  In addition, I have reviewed and discussed with patient certain preventive protocols, quality metrics, and best practice recommendations. A written personalized care plan for preventive services as well as  general preventive health recommendations were provided to patient.  See attached scanned questionnaire for additional information.   Signed,  Deon PillingAmmie Jaydah Stahle, LPN Nurse Health Advisor   I have reviewed this encounter including the documentation in this note and/or discussed this patient with the provider, Deon PillingAmmie Detrell Umscheid, LPN. I am certifying that I agree with the content of this note as supervising physician.  Alba CoryKrichna Sowles, MD [facility] 10/12/2017, 10:42 AM

## 2017-10-14 ENCOUNTER — Other Ambulatory Visit: Payer: Self-pay | Admitting: Family Medicine

## 2017-10-15 ENCOUNTER — Ambulatory Visit: Payer: Medicare Other

## 2017-10-16 ENCOUNTER — Ambulatory Visit: Payer: Medicare Other

## 2017-10-16 DIAGNOSIS — M25551 Pain in right hip: Secondary | ICD-10-CM

## 2017-10-16 DIAGNOSIS — Z9181 History of falling: Secondary | ICD-10-CM

## 2017-10-16 DIAGNOSIS — R262 Difficulty in walking, not elsewhere classified: Secondary | ICD-10-CM

## 2017-10-16 NOTE — Therapy (Signed)
[facility] [address] [place], [address] Phone: [phone]   Fax:  [phone]  Physical Therapy Treatment  Patient Details  Name: Monique Baker MRN: [id number] Date of Birth: [birth year] Referring Provider: Steele Sizer, MD   Encounter Date: 10/16/2017  PT End of Session - 10/16/17 1623    Visit Number  36    Number of Visits  60    Date for PT Re-Evaluation  10/25/17    PT Start Time  1624 pt arrived late and used restroom    PT Stop Time  1705    PT Time Calculation (min)  41 min    Activity Tolerance  Patient tolerated treatment well    Behavior During Therapy  [facility] for tasks assessed/performed       Past Medical History:  Diagnosis Date  . Allergy   . Depression   . GERD (gastroesophageal reflux disease)   . Hyperlipidemia   . Hypertension   . Internal hemorrhoids   . Menopausal disorder   . Osteoarthritis    hands  . Prediabetes   . Wears dentures    partial upper    Past Surgical History:  Procedure Laterality Date  . ABDOMINAL HYSTERECTOMY    . CATARACT EXTRACTION, BILATERAL    . CESAREAN SECTION    . COLONOSCOPY WITH PROPOFOL N/A 03/15/2016   Procedure: COLONOSCOPY WITH PROPOFOL;  Surgeon: Christene Lye, MD;  Location: [facility];  Service: Endoscopy;  Laterality: N/A;  . HALLUX VALGUS LAPIDUS Left 06/16/2015   Procedure: HALLUX VALGUS LAPIDUS;  Surgeon: Samara Deist, DPM;  Location: [place];  Service: Podiatry;  Laterality: Left;  WITH POPLITEAL  . HAMMER TOE SURGERY Left 06/16/2015   Procedure: HAMMER TOE CORRECTION SECOND TOE LEFT;  Surgeon: Samara Deist, DPM;  Location: [place];  Service: Podiatry;  Laterality: Left;  prediabetic - on oral meds  . MYOMECTOMY    . trigger finger Right 12/15/2014  . Vulva cyst  11/2006    There were no vitals filed for this visit.  Subjective Assessment - 10/16/17 1624    Subjective  R hip is not too  good today. I think the rain got to it. 1/10 before today. 3/10 today.     Pertinent History  R hip osteoarthritis. A couple of years ago, pt would have soreness in her R posterior hip. Had x-rays and was told that she has a hairline fracture which is healing itself. Pain stopped hurting afterwards. Pain however started from her R posterior hip to anterior hip at the joint area. Went to another doctor and was told that she has arthritis. Sometimes feels pain R medial thigh but not past her knee. Feels like her R leg would give way at times but does not fall. Her doctor does not think that she needs surgery for her R hip.  Did a lot of fixing up around her house yesterday and was on her hip a lot which bothered her hip.   Denies back pain.  Denies unexplained changes in weight.  Pain does not wake her up at night.   Pt assumes that her R hip hairline fracture is healed.   Pt husband is a Theme park manager and feels like she is in the "lime light." Wants to be able to walk better so people do not have to ask her if she is ok as much.     Patient Stated Goals  Walk straight without limping.  Currently in Pain?  Yes    Pain Score  3     Pain Onset  More than a month ago                               PT Education - 10/16/17 1632    Education provided  Yes    Education Details  ther-ex    [employer]) Educated  Patient    Methods  Explanation;Demonstration;Tactile cues;Verbal cues    Comprehension  Returned demonstration;Verbalized understanding          Objectives   Medbridge Access Code: [email or web address]       Therapeutic Exercise:   Standing low rows to promote trunk strengthening, lumbopelvic control and to decrease lateral lean while walking             Green band 10x2 with 5 second holds   standing R LE leg press resisting double blue band with bilateral UE assist 10x3 to promote rear end muscle strengthening  Side stepping red band around distal thighs 32 ft  to the R and 32 ft to the L, then 15 ft each direction to promote glute med muscle strengthening  standing R hip extension resisting yellow band with bilateral UE assist 10x2  Forward step up onto and over Air Ex pad with one UE assist 10x3 with R LE  No  R hip pain after aforementioned exercises.   SLS on R LE with L foot toe taps onto 8 inch cones (3 to the side) 5x2            Forward step up onto and over Dyna disc with R LE with one UE to no UE assist 10x2    Improved exercise technique, movement at target joints, use of target muscles after min to mod verbal, visual, tactile cues.   Increased rest breaks secondary to pt fatigue from a long day of errands. Continued working on glute strengthening and movement to promote lumbopelvic control during gait and to decrease pain. Decreased R hip pain with exercises.      PT Long Term Goals - 08/30/17 1323      PT LONG TERM GOAL #1   Title  Pt will have a decrease in R hip pain to 2/10 or less at worst to promote ability to ambulate, negotiate stairs, pick up items from the floor.     Baseline  4/10 R hip pain at worst for the past 2 months (03/28/2017); 5/10 for the past 7 days (05/10/17) and (05/29/17); 5/10 R hip pain at most for the past 7 days (06/28/2017); 2/10 R hip pain at most for the past 7 days.  Potentially acheived goal, 3/10 at most for the past 7 days due to sitting 1.5 hours at a Bible study, other than that, 1/10 pain at most for the past 7 days (08/23/2017)    Time  1    Period  Weeks    Status  Achieved      PT LONG TERM GOAL #2   Title  Pt will improve R hip abduction and extension strength by at least 1/2 MMT grade to promote ability to ambulate with less R hip pain.    Baseline  On eval: R hip E: 4+/5, R hip Abd 4-/5; On 05/10/17 R hip E: 4+/5, R hip Abd 4-/5 and on (05/29/17); 4+/5 R hip extension, 4/5 R hip abduction (06/28/2017), (07/24/2017); Not tested today secondary to being only  1 week post op from R eye  cataract surgery (08/21/2017)    Time  8    Period  Weeks    Status  Partially Met    Target Date  10/25/17      PT LONG TERM GOAL #3   Title  Pt will improve her LEFS score by at least 9 points as a demonstration of improved function.     Baseline  37/80 (03/28/2017); 38/80 on 05/10/17; 51/80    Time  3    Period  Weeks    Status  Achieved      PT LONG TERM GOAL #4   Title  Pt will report being able to tolerate walking at least 10 minutes to promote mobility.     Baseline  Pt reports walking 5 minutes bothers her R hip 03/28/2017; can walk 10-15 (05/10/17)     Time  6    Period  Weeks    Status  Achieved      PT LONG TERM GOAL #5   Title  Pt will be able to don and doff socks and shoes with 4/10 pain or less for improved QOL    Baseline  7/10 pain; 3/10    Time  3    Period  Weeks    Status  Achieved      Additional Long Term Goals   Additional Long Term Goals  Yes      PT LONG TERM GOAL #6   Title  Pt will improve her DGI score to at least 20/24 as a demonstration of decreased fall risk.     Baseline  18/24 (07/24/2017); 22/24 (08/21/2017)    Time  4    Period  Weeks    Status  Achieved      PT LONG TERM GOAL #7   Title  Pt will report improved ability to perform R hip flexion to step onto a step or over obstacles with less pain to help decrease fall risk and improve ability to perform standing tasks.     Baseline  Difficulty with R hip flexion to step onto a step or over obstacles due to pain (08/30/2017)    Time  8    Period  Weeks    Status  New    Target Date  10/25/17      PT LONG TERM GOAL #8   Title  Pt will report decreased occurences of R LE buckling while walking to promote mobility and decrease fall risk.     Baseline  Pt states R LE buckling (no falls) while walking (08/30/2017).     Time  8    Period  Weeks    Status  New    Target Date  10/25/17            Plan - 10/16/17 1622    Clinical Impression Statement  Increased rest breaks secondary to pt  fatigue from a long day of errands. Continued working on glute strengthening and movement to promote lumbopelvic control during gait and to decrease pain. Decreased R hip pain with exercises.    Rehab Potential  Fair    Clinical Impairments Affecting Rehab Potential  Chronicity of condition    PT Frequency  2x / week    PT Duration  8 weeks    PT Treatment/Interventions  Therapeutic activities;Therapeutic exercise;Neuromuscular re-education;Aquatic Therapy;Electrical Stimulation;Iontophoresis 23m/ml Dexamethasone;Ultrasound;Gait training;Stair training;Patient/family education;Manual techniques;Dry needling;Traction traction if appropriate    PT Next Visit Plan  Balance related activities, hip ROM,  glute strengthening    Consulted and Agree with Plan of Care  Patient       Patient will benefit from skilled therapeutic intervention in order to improve the following deficits and impairments:  Pain, Improper body mechanics, Difficulty walking, Decreased strength  Visit Diagnosis: Pain in right hip  Difficulty in walking, not elsewhere classified  History of falling     Problem List Patient Active Problem List   Diagnosis Date Noted  . Osteopenia of hip 09/04/2016  . History of bunionectomy of left great toe 07/15/2015  . H/O hammer toe correction 03/15/2015  . Arthritis of lumbar spine 01/19/2015  . Benign essential HTN 11/05/2014  . Carpal tunnel syndrome 11/05/2014  . Osteoarthritis 11/05/2014  . Dermatitis, eczematoid 11/05/2014  . Depression, major, recurrent, mild ([place]) 11/05/2014  . Dyslipidemia 11/05/2014  . Gastro-esophageal reflux disease without esophagitis 11/05/2014  . Morbid obesity due to excess calories ([place]) 11/05/2014  . Dysmetabolic syndrome 49/70/2637  . Menopausal and perimenopausal disorder 11/05/2014  . Perennial allergic rhinitis with seasonal variation 11/05/2014  . Seborrhea capitis 11/05/2014  . Acquired trigger finger 11/05/2014  . Urinary  incontinence in female 11/05/2014  . Vitamin D deficiency 11/05/2014  . Blood glucose elevated 08/31/2009     Joneen Boers PT, DPT  10/16/2017, 6:01 PM   [facility] [address] [place], [address] Phone: [phone]   Fax:  [phone]  Name: Monique Baker MRN: [id number] Date of Birth: [birth year]

## 2017-10-18 ENCOUNTER — Ambulatory Visit: Payer: Medicare Other

## 2017-10-18 DIAGNOSIS — R262 Difficulty in walking, not elsewhere classified: Secondary | ICD-10-CM

## 2017-10-18 DIAGNOSIS — Z9181 History of falling: Secondary | ICD-10-CM

## 2017-10-18 DIAGNOSIS — M25551 Pain in right hip: Secondary | ICD-10-CM | POA: Diagnosis not present

## 2017-10-18 NOTE — Therapy (Signed)
[facility] [address] [place], [address] Phone: [phone]   Fax:  [phone]  Physical Therapy Treatment  Patient Details  Name: Monique Baker MRN: [id number] Date of Birth: 1951-01-12 Referring Provider: Steele Sizer, MD   Encounter Date: 10/18/2017  PT End of Session - 10/18/17 1306    Visit Number  37    Number of Visits  60    Date for PT Re-Evaluation  10/25/17    PT Start Time  1306    PT Stop Time  2563    PT Time Calculation (min)  41 min    Activity Tolerance  Patient tolerated treatment well    Behavior During Therapy  [facility] for tasks assessed/performed       Past Medical History:  Diagnosis Date  . Allergy   . Depression   . GERD (gastroesophageal reflux disease)   . Hyperlipidemia   . Hypertension   . Internal hemorrhoids   . Menopausal disorder   . Osteoarthritis    hands  . Prediabetes   . Wears dentures    partial upper    Past Surgical History:  Procedure Laterality Date  . ABDOMINAL HYSTERECTOMY    . CATARACT EXTRACTION, BILATERAL    . CESAREAN SECTION    . COLONOSCOPY WITH PROPOFOL N/A 03/15/2016   Procedure: COLONOSCOPY WITH PROPOFOL;  Surgeon: Christene Lye, MD;  Location: [facility];  Service: Endoscopy;  Laterality: N/A;  . HALLUX VALGUS LAPIDUS Left 06/16/2015   Procedure: HALLUX VALGUS LAPIDUS;  Surgeon: Samara Deist, DPM;  Location: [place];  Service: Podiatry;  Laterality: Left;  WITH POPLITEAL  . HAMMER TOE SURGERY Left 06/16/2015   Procedure: HAMMER TOE CORRECTION SECOND TOE LEFT;  Surgeon: Samara Deist, DPM;  Location: [place];  Service: Podiatry;  Laterality: Left;  prediabetic - on oral meds  . MYOMECTOMY    . trigger finger Right 12/15/2014  . Vulva cyst  11/2006    There were no vitals filed for this visit.  Subjective Assessment - 10/18/17 1307    Subjective  R hip is fairly well. Exercised this morning. No  pain currently. Just feels sore when she walks. Other than that, pt would not really call it pain. Having difficulty putting on her R shoe again which started back again sometime this week.  Has not had sensations of R LE buckling in a while.     Pertinent History  R hip osteoarthritis. A couple of years ago, pt would have soreness in her R posterior hip. Had x-rays and was told that she has a hairline fracture which is healing itself. Pain stopped hurting afterwards. Pain however started from her R posterior hip to anterior hip at the joint area. Went to another doctor and was told that she has arthritis. Sometimes feels pain R medial thigh but not past her knee. Feels like her R leg would give way at times but does not fall. Her doctor does not think that she needs surgery for her R hip.  Did a lot of fixing up around her house yesterday and was on her hip a lot which bothered her hip.   Denies back pain.  Denies unexplained changes in weight.  Pain does not wake her up at night.   Pt assumes that her R hip hairline fracture is healed.   Pt husband is a Theme park manager and feels like she is in the "lime light." Wants to be able to  walk better so people do not have to ask her if she is ok as much.     Patient Stated Goals  Walk straight without limping.     Currently in Pain?  No/denies    Pain Score  0-No pain    Pain Onset  More than a month ago                               PT Education - 10/18/17 1311    Education provided  Yes    Education Details  ther-ex    [employer]) Educated  Patient    Methods  Explanation;Demonstration;Tactile cues    Comprehension  Returned demonstration;Verbalized understanding          Objectives   Medbridge Access Code: [id number]       Therapeutic Exercise:  Seated hip adduction ball and glute max squeeze 10x10 seconds for 2 sets  Seated R hip IR 10x5 seconds for 2 sets   standing R IT band stretch 15 seconds x 5    Standing mini squats 5x3 with glute max squeeze   S/L reverse clamshells 10x3  S/L R hip abduction 10x2  Supine R piriformis stretch 15 seconds x 5  Reviewed and given as part of her HEP. Pt demonstrated and verbalized understanding.   standing R LE leg press resisting double blue band with bilateral UE assist 10x3to promote rear end muscle strengthening    Improved exercise technique, movement at target joints, use of target muscles after min to mod verbal, visual, tactile cues.   Decreased difficulty putting on her R shoe (R hip flexion and ER) following exercises that promote IR of R hip and decreasing piriformis muscle activation. Pt also demonstrates improved R LE strength and decreased fall risk, especially with pt reports of no sensation of R LE buckling in a while.      PT Long Term Goals - 08/30/17 1323      PT LONG TERM GOAL #1   Title  Pt will have a decrease in R hip pain to 2/10 or less at worst to promote ability to ambulate, negotiate stairs, pick up items from the floor.     Baseline  4/10 R hip pain at worst for the past 2 months (03/28/2017); 5/10 for the past 7 days (05/10/17) and (05/29/17); 5/10 R hip pain at most for the past 7 days (06/28/2017); 2/10 R hip pain at most for the past 7 days.  Potentially acheived goal, 3/10 at most for the past 7 days due to sitting 1.5 hours at a Bible study, other than that, 1/10 pain at most for the past 7 days (08/23/2017)    Time  1    Period  Weeks    Status  Achieved      PT LONG TERM GOAL #2   Title  Pt will improve R hip abduction and extension strength by at least 1/2 MMT grade to promote ability to ambulate with less R hip pain.    Baseline  On eval: R hip E: 4+/5, R hip Abd 4-/5; On 05/10/17 R hip E: 4+/5, R hip Abd 4-/5 and on (05/29/17); 4+/5 R hip extension, 4/5 R hip abduction (06/28/2017), (07/24/2017); Not tested today secondary to being only 1 week post op from R eye cataract surgery (08/21/2017)    Time  8     Period  Weeks    Status  Partially Met  Target Date  10/25/17      PT LONG TERM GOAL #3   Title  Pt will improve her LEFS score by at least 9 points as a demonstration of improved function.     Baseline  37/80 (03/28/2017); 38/80 on 05/10/17; 51/80    Time  3    Period  Weeks    Status  Achieved      PT LONG TERM GOAL #4   Title  Pt will report being able to tolerate walking at least 10 minutes to promote mobility.     Baseline  Pt reports walking 5 minutes bothers her R hip 03/28/2017; can walk 10-15 (05/10/17)     Time  6    Period  Weeks    Status  Achieved      PT LONG TERM GOAL #5   Title  Pt will be able to don and doff socks and shoes with 4/10 pain or less for improved QOL    Baseline  7/10 pain; 3/10    Time  3    Period  Weeks    Status  Achieved      Additional Long Term Goals   Additional Long Term Goals  Yes      PT LONG TERM GOAL #6   Title  Pt will improve her DGI score to at least 20/24 as a demonstration of decreased fall risk.     Baseline  18/24 (07/24/2017); 22/24 (08/21/2017)    Time  4    Period  Weeks    Status  Achieved      PT LONG TERM GOAL #7   Title  Pt will report improved ability to perform R hip flexion to step onto a step or over obstacles with less pain to help decrease fall risk and improve ability to perform standing tasks.     Baseline  Difficulty with R hip flexion to step onto a step or over obstacles due to pain (08/30/2017)    Time  8    Period  Weeks    Status  New    Target Date  10/25/17      PT LONG TERM GOAL #8   Title  Pt will report decreased occurences of R LE buckling while walking to promote mobility and decrease fall risk.     Baseline  Pt states R LE buckling (no falls) while walking (08/30/2017).     Time  8    Period  Weeks    Status  New    Target Date  10/25/17            Plan - 10/18/17 1312    Clinical Impression Statement  Decreased difficulty putting on her R shoe (R hip flexion and ER) following  exercises that promote IR of R hip and decreasing piriformis muscle activation. Pt also demonstrates improved R LE strength and decreased fall risk, especially with pt reports of no sensation of R LE buckling in a while.     Rehab Potential  Fair    Clinical Impairments Affecting Rehab Potential  Chronicity of condition    PT Frequency  2x / week    PT Duration  8 weeks    PT Treatment/Interventions  Therapeutic activities;Therapeutic exercise;Neuromuscular re-education;Aquatic Therapy;Electrical Stimulation;Iontophoresis 52m/ml Dexamethasone;Ultrasound;Gait training;Stair training;Patient/family education;Manual techniques;Dry needling;Traction traction if appropriate    PT Next Visit Plan  Balance related activities, hip ROM, glute strengthening    Consulted and Agree with Plan of Care  Patient  Patient will benefit from skilled therapeutic intervention in order to improve the following deficits and impairments:  Pain, Improper body mechanics, Difficulty walking, Decreased strength  Visit Diagnosis: Pain in right hip  Difficulty in walking, not elsewhere classified  History of falling     Problem List Patient Active Problem List   Diagnosis Date Noted  . Osteopenia of hip 09/04/2016  . History of bunionectomy of left great toe 07/15/2015  . H/O hammer toe correction 03/15/2015  . Arthritis of lumbar spine 01/19/2015  . Benign essential HTN 11/05/2014  . Carpal tunnel syndrome 11/05/2014  . Osteoarthritis 11/05/2014  . Dermatitis, eczematoid 11/05/2014  . Depression, major, recurrent, mild ([place]) 11/05/2014  . Dyslipidemia 11/05/2014  . Gastro-esophageal reflux disease without esophagitis 11/05/2014  . Morbid obesity due to excess calories ([place]) 11/05/2014  . Dysmetabolic syndrome 43/56/8616  . Menopausal and perimenopausal disorder 11/05/2014  . Perennial allergic rhinitis with seasonal variation 11/05/2014  . Seborrhea capitis 11/05/2014  . Acquired trigger finger  11/05/2014  . Urinary incontinence in female 11/05/2014  . Vitamin D deficiency 11/05/2014  . Blood glucose elevated 08/31/2009    Joneen Boers PT, DPT   10/18/2017, 6:46 PM  [facility] [address], [place], [address] Phone: [phone]   Fax:  [phone]  Name: ANAROSA KUBISIAK MRN: [id number] Date of Birth: 07-25-1950

## 2017-10-18 NOTE — Patient Instructions (Signed)
Medbridge  Supine Piriformis Stretch with Towel  5x15 seconds for 2 times daily

## 2017-10-22 ENCOUNTER — Ambulatory Visit: Payer: Medicare Other

## 2017-10-23 ENCOUNTER — Ambulatory Visit (INDEPENDENT_AMBULATORY_CARE_PROVIDER_SITE_OTHER): Payer: Medicare Other | Admitting: Family Medicine

## 2017-10-23 ENCOUNTER — Ambulatory Visit: Payer: Medicare Other

## 2017-10-23 ENCOUNTER — Encounter: Payer: Self-pay | Admitting: Family Medicine

## 2017-10-23 VITALS — BP 134/76 | HR 71 | Temp 98.0°F | Resp 16 | Ht 62.0 in | Wt 197.4 lb

## 2017-10-23 DIAGNOSIS — E785 Hyperlipidemia, unspecified: Secondary | ICD-10-CM

## 2017-10-23 DIAGNOSIS — M1611 Unilateral primary osteoarthritis, right hip: Secondary | ICD-10-CM

## 2017-10-23 DIAGNOSIS — F331 Major depressive disorder, recurrent, moderate: Secondary | ICD-10-CM | POA: Diagnosis not present

## 2017-10-23 DIAGNOSIS — F411 Generalized anxiety disorder: Secondary | ICD-10-CM

## 2017-10-23 DIAGNOSIS — I10 Essential (primary) hypertension: Secondary | ICD-10-CM

## 2017-10-23 DIAGNOSIS — R739 Hyperglycemia, unspecified: Secondary | ICD-10-CM

## 2017-10-23 DIAGNOSIS — E8881 Metabolic syndrome: Secondary | ICD-10-CM | POA: Diagnosis not present

## 2017-10-23 LAB — POCT GLYCOSYLATED HEMOGLOBIN (HGB A1C): HBA1C, POC (PREDIABETIC RANGE): 5.8 % (ref 5.7–6.4)

## 2017-10-23 NOTE — Progress Notes (Signed)
Name: Monique Baker   MRN: [id number]    DOB: [birth year]   Date:10/23/2017       Progress Note  Subjective  Chief Complaint  Chief Complaint  Patient presents with  . Medication Refill    3 month F/U  . Depression  . Hypertension    Denies any symptoms  . Pre diabetes/metabolic syndrome  . Obesity    Has loss 4 pounds since last visit-trying to eat healthier  . Hip Pain    Still doing PT and progressing but still has pain when sitting for long periods of time and then trying to get up    HPI  Pre diabetes/metabolic syndrome: she is taking Metformin, denies side effects, hgbA1C was 5.8%, no polyphagia, polydipsia or polyuria. Also going to weight watchers, weight has been stable.   Right hip pain/OA: aggravated by activity, such as standing for a long time, or sitting for a prolonged period of time,  walking helps with pain, PT also helps. . She takes nsaid's occasionally, but advised Tylenol instead . seen by Dr. Hyacinth Meeker in the past. She is getting PT and states it helps with her pain. She still has daily pain and decrease in rom , advised to go back to Dr. Hyacinth Meeker for further evaluation   Extreme obesity:she had lost 17 lbs since she joined weight watchers 05/2016 and is doing well. Not drinking sweet beverages on a regular  basis, counting points, eating more fruit and vegetables, taking some protein shakes.Weight has been unchanged since last visit.   HTN: taking bp medication, denies side effects, no chest pain, no palpitation. She is complaint with medication. BP is at goal. Unchanged   Depression Major: she is married to a Education officer, environmental, he also works as a Airline pilot at [employer]. She is only baby-sitting grandchildren prn and that is not as stressful now. We started her on Cymbalta 10/2015 and is feeling better no longer has anhedonia, motivation is back, no longer having crying spells, and not feeling so hungry, however has hot flashes and hair loss and is concerned about  possible side effects, we switched to Celexa back in Fall 2018  and hot flashes resolved and hair is growing again. We also added March 2019 but continues to feel down, phq 9 is high, discussed importance of therapy, she will check with insurance.  Recurrent Fall: fell Fall 2018 and again in 05/2017 , hit head on the pavement and had some bleeding, did not go to [facility], daughter helped her out. She has been doing PT since Spring and no falls since.     Patient Active Problem List   Diagnosis Date Noted  . Osteopenia of hip 09/04/2016  . History of bunionectomy of left great toe 07/15/2015  . H/O hammer toe correction 03/15/2015  . Arthritis of lumbar spine 01/19/2015  . Benign essential HTN 11/05/2014  . Carpal tunnel syndrome 11/05/2014  . Osteoarthritis 11/05/2014  . Dermatitis, eczematoid 11/05/2014  . Depression, major, recurrent, mild (HCC) 11/05/2014  . Dyslipidemia 11/05/2014  . Gastro-esophageal reflux disease without esophagitis 11/05/2014  . Morbid obesity due to excess calories (HCC) 11/05/2014  . Dysmetabolic syndrome 11/05/2014  . Menopausal and perimenopausal disorder 11/05/2014  . Perennial allergic rhinitis with seasonal variation 11/05/2014  . Seborrhea capitis 11/05/2014  . Acquired trigger finger 11/05/2014  . Urinary incontinence in female 11/05/2014  . Vitamin D deficiency 11/05/2014  . Blood glucose elevated 08/31/2009    Past Surgical History:  Procedure Laterality Date  . ABDOMINAL  HYSTERECTOMY    . CATARACT EXTRACTION, BILATERAL    . CESAREAN SECTION    . COLONOSCOPY WITH PROPOFOL N/A 03/15/2016   Procedure: COLONOSCOPY WITH PROPOFOL;  Surgeon: Kieth BrightlySeeplaputhur G Sankar, MD;  Location: [facility] ENDOSCOPY;  Service: Endoscopy;  Laterality: N/A;  . HALLUX VALGUS LAPIDUS Left 06/16/2015   Procedure: HALLUX VALGUS LAPIDUS;  Surgeon: Gwyneth RevelsJustin Fowler, DPM;  [facility];  Service: Podiatry;  Laterality: Left;  WITH POPLITEAL  . HAMMER TOE SURGERY Left  06/16/2015   Procedure: HAMMER TOE CORRECTION SECOND TOE LEFT;  Surgeon: Gwyneth RevelsJustin Fowler, DPM;  [facility];  Service: Podiatry;  Laterality: Left;  prediabetic - on oral meds  . MYOMECTOMY    . trigger finger Right 12/15/2014  . Vulva cyst  11/2006    Family History  Problem Relation Age of Onset  . Dementia Mother   . Diabetes Father   . Stroke Father   . Cancer Daughter        Fibroid Tumors  . Diabetes Brother   . Breast cancer Neg Hx     Social History   Socioeconomic History  . Marital status: Married    Spouse name: Adela GlimpseBernard  . Number of children: 1  . Years of education: Not on file  . Highest education level: Bachelor's degree (e.g., BA, AB, BS)  Occupational History  . Occupation: retired Runner, broadcasting/film/videoteacher    [place]  Social Needs  . Financial resource strain: Not hard at all  . Food insecurity:    Worry: Never true    Inability: Never true  . Transportation needs:    Medical: No    Non-medical: No  Tobacco Use  . Smoking status: Never Smoker  . Smokeless tobacco: Never Used  . Tobacco comment: smoking cessation materials not required  Substance and Sexual Activity  . Alcohol use: No    Alcohol/week: 0.0 oz  . Drug use: No  . Sexual activity: Not Currently    Partners: Male  Lifestyle  . Physical activity:    Days per week: 0 days    Minutes per session: 0 min  . Stress: Rather much  Relationships  . Social connections:    Talks on phone: Patient refused    Gets together: Patient refused    Attends religious service: Patient refused    Active member of club or organization: Patient refused    Attends meetings of clubs or organizations: Patient refused    Relationship status: Married  . Intimate partner violence:    Fear of current or ex partner: No    Emotionally abused: No    Physically abused: No    Forced sexual activity: No  Other Topics Concern  . Not on file  Social History Narrative   Married to a Education officer, environmentalpastor and he  is a professor at [employer] and education officer( Sociology )      Current Outpatient Medications:  .  acetaminophen (TYLENOL) 500 MG tablet, Take by mouth. Reported on 11/15/2015, Disp: , Rfl:  .  aspirin EC 81 MG tablet, Take 81 mg by mouth daily., Disp: , Rfl:  .  atorvastatin (LIPITOR) 40 MG tablet, Take 1 tablet (40 mg total) by mouth every evening. for cholesterol, Disp: 90 tablet, Rfl: 1 .  buPROPion (WELLBUTRIN XL) 150 MG 24 hr tablet, Take 1 tablet (150 mg total) by mouth daily before breakfast., Disp: 90 tablet, Rfl: 1 .  Cholecalciferol (VITAMIN D-3) 5000 units TABS, Take 1 tablet by mouth daily., Disp: , Rfl:  .  citalopram (CELEXA) 20 MG tablet, Take 1 tablet (20 mg total) by mouth daily., Disp: 90 tablet, Rfl: 1 .  Cyanocobalamin (B-12) 1000 MCG SUBL, Place 1 each under the tongue once a week. , Disp: , Rfl:  .  Flaxseed, Linseed, (FLAXSEED OIL) 1000 MG CAPS, Take 1 each by mouth 3 (three) times daily., Disp: , Rfl:  .  fluticasone (FLONASE) 50 MCG/ACT nasal spray, Place 2 sprays into both nostrils daily., Disp: 48 g, Rfl: 0 .  glucose blood (ONETOUCH VERIO) test strip, Use as instructed, Disp: 100 each, Rfl: 12 .  loratadine (CLARITIN) 10 MG tablet, TAKE 1 TABLET (10 MG TOTAL) BY MOUTH DAILY., Disp: 90 tablet, Rfl: 1 .  meloxicam (MOBIC) 15 MG tablet, TAKE 1 TABLET BY MOUTH EVERY DAY, Disp: 30 tablet, Rfl: 0 .  metFORMIN (GLUCOPHAGE) 500 MG tablet, Take 1 tablet (500 mg total) by mouth 2 (two) times daily., Disp: 180 tablet, Rfl: 1 .  nadolol (CORGARD) 20 MG tablet, Take 1 tablet (20 mg total) by mouth every evening., Disp: 90 tablet, Rfl: 1 .  Olmesartan-amLODIPine-HCTZ 20-5-12.5 MG TABS, Take 1 tablet by mouth daily., Disp: 90 tablet, Rfl: 1 .  triamcinolone cream (KENALOG) 0.1 %, Reported on 07/15/2015, Disp: , Rfl:  .  Cholecalciferol (VITAMIN D PO), Take 1 capsule by mouth daily. , Disp: , Rfl:  .  silver sulfADIAZINE (SILVADENE) 1 % cream, Apply 1 application topically daily. (Patient not taking:  Reported on 10/12/2017), Disp: 50 g, Rfl: 0  Allergies  Allergen Reactions  . Duloxetine     sweat     ROS  Constitutional: Negative for fever or weight change.  Respiratory: Negative for cough and shortness of breath.   Cardiovascular: Negative for chest pain or palpitations.  Gastrointestinal: Negative for abdominal pain, no bowel changes.  Musculoskeletal: Positive for gait problem but no  joint swelling.  Skin: Negative for rash.  Neurological: Negative for dizziness or headache.  No other specific complaints in a complete review of systems (except as listed in HPI above).  Objective  Vitals:   10/23/17 0853  BP: 134/76  Pulse: 71  Resp: 16  Temp: 98 F (36.7 C)  TempSrc: Oral  SpO2: 97%  Weight: 197 lb 6.4 oz (89.5 kg)  Height: 5\' 2"  (1.575 m)    Body mass index is 36.1 kg/m.  Physical Exam  Constitutional: Patient appears well-developed and well-nourished. Obese No distress.  HEENT: head atraumatic, normocephalic, pupils equal and reactive to light,  neck supple, throat within normal limits Cardiovascular: Normal rate, regular rhythm and normal heart sounds.  No murmur heard. No BLE edema. Pulmonary/Chest: Effort normal and breath sounds normal. No respiratory distress. Abdominal: Soft.  There is no tenderness. Muscular skeletal: limping a little from right hip  Psychiatric: Patient has a normal mood and affect. behavior is normal. Judgment and thought content normal.   Recent Results (from the past 2160 hour(s))  POCT HgB A1C     Status: Normal   Collection Time: 10/23/17  9:15 AM  Result Value Ref Range   Hemoglobin A1C  4.0 - 5.6 %   HbA1c POC (<> result, manual entry)  4.0 - 5.6 %   HbA1c, POC (prediabetic range) 5.8 5.7 - 6.4 %   HbA1c, POC (controlled diabetic range)  0.0 - 7.0 %     PHQ2/9: Depression screen [facility] 2/9 10/23/2017 10/12/2017 07/23/2017 03/19/2017 01/18/2017  Decreased Interest 0 3 0 0 0  Down, Depressed, Hopeless 2 3 3  0 0  PHQ - 2  Score 2 6 3  0 0  Altered sleeping 0 1 0 - -  Tired, decreased energy 2 3 2  - -  Change in appetite 0 0 0 - -  Feeling bad or failure about yourself  1 0 0 - -  Trouble concentrating 0 0 0 - -  Moving slowly or fidgety/restless 0 0 0 - -  Suicidal thoughts 0 0 0 - -  PHQ-9 Score 5 10 5  - -  Difficult doing work/chores Not difficult at all Not difficult at all Somewhat difficult - -  Some recent data might be hidden    Fall Risk: Fall Risk  10/23/2017 10/12/2017 07/23/2017 07/23/2017 03/19/2017  Falls in the past year? Yes Yes - Yes Yes  Comment - - - - -  Number falls in past yr: 1 1 - 2 or more 1  Injury with Fall? Yes No - Yes Yes  Comment - - - - -  Risk for fall due to : - Impaired vision;Medication side effect;Impaired balance/gait History of fall(s);Impaired balance/gait - -  Risk for fall due to: Comment - wears eyeglasses; recent R hip replacement - - -  Follow up - Falls evaluation completed;Education provided;Falls prevention discussed Education provided;Falls prevention discussed Education provided -     Functional Status Survey: Is the patient deaf or have difficulty hearing?: No Does the patient have difficulty seeing, even when wearing glasses/contacts?: Yes Does the patient have difficulty concentrating, remembering, or making decisions?: No Does the patient have difficulty walking or climbing stairs?: Yes(Right Hip Pain makes it difficult for her) Does the patient have difficulty dressing or bathing?: No Does the patient have difficulty doing errands alone such as visiting a doctor's office or shopping?: Yes   Assessment & Plan   1. Dysmetabolic syndrome  - POCT HgB R6E  2. Depression, major, recurrent, moderate  (HCC)  A little worse, discussed counseling again, already on medications  3. Hyperglycemia  - POCT HgB A1C  4. Benign essential HTN  At goal   5. Morbid obesity due to excess calories [facility])  Discussed with the patient the risk posed by an  increased BMI. Discussed importance of portion control, calorie counting and at least 150 minutes of physical activity weekly. Avoid sweet beverages and drink more water. Eat at least 6 servings of fruit and vegetables daily   6. Primary osteoarthritis of right hip  She has done over 3 months of PT and still has decrease rom of pain on right groin, advised to go aback to Dr. Hyacinth Meeker   7. Dyslipidemia  Continue statin therapy   8. GAD (generalized anxiety disorder)  Discussed counseling again

## 2017-10-25 ENCOUNTER — Ambulatory Visit: Payer: Medicare Other

## 2017-10-29 ENCOUNTER — Ambulatory Visit: Payer: Medicare Other | Attending: Family Medicine

## 2017-10-29 DIAGNOSIS — Z9181 History of falling: Secondary | ICD-10-CM | POA: Insufficient documentation

## 2017-10-29 DIAGNOSIS — M25551 Pain in right hip: Secondary | ICD-10-CM | POA: Insufficient documentation

## 2017-10-29 DIAGNOSIS — R262 Difficulty in walking, not elsewhere classified: Secondary | ICD-10-CM | POA: Insufficient documentation

## 2017-10-29 NOTE — Patient Instructions (Addendum)
Medbridge Access Code: [email or web address]  Sidelying Reverse Clamshell 10x3 daily   Sidelying Hip Abduction 10x2 daily    SLS 1-2 sets of 5x10 second holds R LE    Reviewed long axis distraction for R hip in supine with husband per pt request. 10 seconds at a time for 5-10 minutes 2x daily so long as it does not bother her knee. Pt husband demonstrated and verbalized understanding.

## 2017-10-29 NOTE — Therapy (Signed)
[facility] [address] [place], [address] Phone: [phone]   Fax:  [phone]  Physical Therapy Treatment And Discharge Summary  Patient Details  Name: Monique Baker MRN: [id number] Date of Birth: 1951/03/13 Referring Provider: Steele Sizer, MD   Encounter Date: 10/29/2017   PT End of Session - 10/29/17 1647    Visit Number  38    Number of Visits  60    Date for PT Re-Evaluation  10/29/17    PT Start Time  7517    PT Stop Time  1756    PT Time Calculation (min)  69 min    Activity Tolerance  Patient tolerated treatment well    Behavior During Therapy  [facility] for tasks assessed/performed       Past Medical History:  Diagnosis Date  . Allergy   . Depression   . GERD (gastroesophageal reflux disease)   . Hyperlipidemia   . Hypertension   . Internal hemorrhoids   . Menopausal disorder   . Osteoarthritis    hands  . Prediabetes   . Wears dentures    partial upper    Past Surgical History:  Procedure Laterality Date  . ABDOMINAL HYSTERECTOMY    . CATARACT EXTRACTION, BILATERAL    . CESAREAN SECTION    . COLONOSCOPY WITH PROPOFOL N/A 03/15/2016   Procedure: COLONOSCOPY WITH PROPOFOL;  Surgeon: Christene Lye, MD;  Location: [facility];  Service: Endoscopy;  Laterality: N/A;  . HALLUX VALGUS LAPIDUS Left 06/16/2015   Procedure: HALLUX VALGUS LAPIDUS;  Surgeon: Samara Deist, DPM;  Location: [facility];  Service: Podiatry;  Laterality: Left;  WITH POPLITEAL  . HAMMER TOE SURGERY Left 06/16/2015   Procedure: HAMMER TOE CORRECTION SECOND TOE LEFT;  Surgeon: Samara Deist, DPM;  Location: [facility];  Service: Podiatry;  Laterality: Left;  prediabetic - on oral meds  . MYOMECTOMY    . trigger finger Right 12/15/2014  . Vulva cyst  11/2006    There were no vitals filed for this visit.  Subjective Assessment - 10/29/17 1649    Subjective  Just got back from a 2.5  hour drive.  Had a rough 2 weeks. Had to go to 2 funerals last week. Did not have many problems with her R hip last week. Today however has been the worst day.  Also could not get up and use her R LE yesterday when trying to get her slipper from under the bed. Also going to see Dr. Sabra Heck tomorrow about her R hip.  Pt told her PCP that its getting there.  Pt also plans to walk more.  Has not been doing the self R hip distraction with the ankle weight.  3/10 at most for the past 7 days.  Only felt buckling sensation in R hip 1x for the past several days.   Able to step over obstacles with R LE, only feels a little bit of symptoms, feels a little better.   Wants to try to do her exercises at home and see if she can make it on her own.     Pt states that she feels like her balance got better. She feels a whole lot better when she first started.      Pertinent History  R hip osteoarthritis. A couple of years ago, pt would have soreness in her R posterior hip. Had x-rays and was told that she has a hairline fracture which is healing itself.  Pain stopped hurting afterwards. Pain however started from her R posterior hip to anterior hip at the joint area. Went to another doctor and was told that she has arthritis. Sometimes feels pain R medial thigh but not past her knee. Feels like her R leg would give way at times but does not fall. Her doctor does not think that she needs surgery for her R hip.  Did a lot of fixing up around her house yesterday and was on her hip a lot which bothered her hip.   Denies back pain.  Denies unexplained changes in weight.  Pain does not wake her up at night.   Pt assumes that her R hip hairline fracture is healed.   Pt husband is a Theme park manager and feels like she is in the "lime light." Wants to be able to walk better so people do not have to ask her if she is ok as much.     Patient Stated Goals  Walk straight without limping.     Currently in Pain?  No/denies    Pain Score  0-No pain     Pain Onset  More than a month ago         [facility] PT Assessment - 10/29/17 1714      Strength   Right Hip Extension  4+/5 seated manually resisted leg press    Right Hip ABduction  4/5                           PT Education - 10/29/17 1733    Education provided  Yes    Education Details  ther-ex, HEP, plan of care    Person(s) Educated  Patient    Methods  Explanation;Demonstration;Tactile cues;Verbal cues;Handout    Comprehension  Verbalized understanding;Returned demonstration         Objectives  Pt states that she wants to try to do her exercises at home and see if she can make it on her own.   Pt states that she feels like her balance got better. She feels a whole lot better when she first started.   Medbridge Access Code: [email or web address]     Therapeutic Exercise:  Reviewed plan of care: continue with HEP for the next 3 months after PT graduation. Pt can return if she needs more formal PT  Sitting with R foot on L knee (cross leg) 1 minute. Decreased R anterior hip discomfort   Seated R hip IR  10x then 10x5 seconds for 2 sets   Less discomfort with tying shoe motion (R hip flexion and ER)  Seated manually resisted R leg press, S/L R hip abduction 1-2x each way  Reviewed progress/current status with hip strength  S/L R hip IR 10x3   S/L R hip abduction 10x 2  Standing R hip extension with bilateral UE assist resisting red band 10x3  SLS R LE with light touch assist 10 seconds x 5  Reviewed HEP. Pt demonstrated and verbalized understanding.   Reviewed progress/current status with PT towards goals.    Improved exercise technique, movement at target joints, use of target muscles after min to mod verbal, visual, tactile cues.      Manual therapy  Reviewed long axis distraction for R hip in supine with husband per pt request. 10 seconds at a time for 5-10 minutes 2x daily so long as it does not bother her knee. Pt husband  demonstrated and verbalized understanding.    Patient demonstrates  overall decreased R hip pain, improved R glute med muscle strength, improved balance, ability to negotiate obstacles, and improved ability to perform functional tasks such as donning and doffing her R sock and shoe since initial evaluation. Pt also demonstrates independence with her HEP. Skilled physical therapy services discharged with patient continuing progress with her exercises at home after recert for today's session.           PT Long Term Goals - 10/29/17 1739      PT LONG TERM GOAL #1   Title  Pt will have a decrease in R hip pain to 2/10 or less at worst to promote ability to ambulate, negotiate stairs, pick up items from the floor.     Baseline  4/10 R hip pain at worst for the past 2 months (03/28/2017); 5/10 for the past 7 days (05/10/17) and (05/29/17); 5/10 R hip pain at most for the past 7 days (06/28/2017); 2/10 R hip pain at most for the past 7 days.  Potentially acheived goal, 3/10 at most for the past 7 days due to sitting 1.5 hours at a Bible study, other than that, 1/10 pain at most for the past 7 days (08/23/2017);  3/10 at worst for the past 7 days (10/29/2017)    Time  1    Period  Weeks    Status  On-going    Target Date  10/29/17      PT LONG TERM GOAL #2   Title  Pt will improve R hip abduction and extension strength by at least 1/2 MMT grade to promote ability to ambulate with less R hip pain.    Baseline  On eval: R hip E: 4+/5, R hip Abd 4-/5; On 05/10/17 R hip E: 4+/5, R hip Abd 4-/5 and on (05/29/17); 4+/5 R hip extension, 4/5 R hip abduction (06/28/2017), (07/24/2017); Not tested today secondary to being only 1 week post op from R eye cataract surgery (08/21/2017); 4+/5 R hip extension, 4/5 R hip abduction (10/29/2017)    Time  8    Period  Weeks    Status  Partially Met    Target Date  10/29/17      PT LONG TERM GOAL #3   Title  Pt will improve her LEFS score by at least 9 points as a  demonstration of improved function.     Baseline  37/80 (03/28/2017); 38/80 on 05/10/17; 51/80    Time  3    Period  Weeks    Status  Achieved      PT LONG TERM GOAL #4   Title  Pt will report being able to tolerate walking at least 10 minutes to promote mobility.     Baseline  Pt reports walking 5 minutes bothers her R hip 03/28/2017; can walk 10-15 (05/10/17)     Time  6    Period  Weeks    Status  Achieved      PT LONG TERM GOAL #5   Title  Pt will be able to don and doff socks and shoes with 4/10 pain or less for improved QOL    Baseline  7/10 pain; 3/10; 3/10 (10/29/2017)    Time  3    Period  Weeks    Status  Achieved      PT LONG TERM GOAL #6   Title  Pt will improve her DGI score to at least 20/24 as a demonstration of decreased fall risk.     Baseline  18/24 (07/24/2017);  22/24 (08/21/2017)    Time  4    Period  Weeks    Status  Achieved      PT LONG TERM GOAL #7   Title  Pt will report improved ability to perform R hip flexion to step onto a step or over obstacles with less pain to help decrease fall risk and improve ability to perform standing tasks.     Baseline  Difficulty with R hip flexion to step onto a step or over obstacles due to pain (08/30/2017); less difficulty (10/29/2017)    Time  8    Period  Weeks    Status  Achieved    Target Date  10/29/17      PT LONG TERM GOAL #8   Title  Pt will report decreased occurences of R LE buckling while walking to promote mobility and decrease fall risk.     Baseline  Pt states R LE buckling (no falls) while walking (08/30/2017). Only 1x in several weeks (10/29/2017)    Time  8    Period  Weeks    Status  Achieved    Target Date  10/29/17            Plan - 10/29/17 1820    Clinical Impression Statement  Patient demonstrates overall decreased R hip pain, improved R glute med muscle strength, improved balance, ability to negotiate obstacles, and improved ability to perform functional tasks such as donning and doffing her R  sock and shoe since initial evaluation. Pt also demonstrates independence with her HEP. Skilled physical therapy services discharged with patient continuing progress with her exercises at home after recert for today's session.     History and Personal Factors relevant to plan of care:  Chronicity of condition    Clinical Presentation  Stable    Clinical Presentation due to:  Pt making progress towards goals    Clinical Decision Making  Low    Rehab Potential  Fair    Clinical Impairments Affecting Rehab Potential  Chronicity of condition    PT Frequency  One time visit for today's recert    PT Treatment/Interventions  Therapeutic activities;Therapeutic exercise;Neuromuscular re-education;Gait training;Patient/family education;Manual techniques;Traction traction if appropriate    PT Next Visit Plan  Continue progress with her HEP    Consulted and Agree with Plan of Care  Patient       Patient will benefit from skilled therapeutic intervention in order to improve the following deficits and impairments:  Pain, Improper body mechanics, Difficulty walking, Decreased strength  Visit Diagnosis: Pain in right hip - Plan: PT plan of care cert/re-cert  Difficulty in walking, not elsewhere classified - Plan: PT plan of care cert/re-cert  History of falling - Plan: PT plan of care cert/re-cert     Problem List Patient Active Problem List   Diagnosis Date Noted  . Osteopenia of hip 09/04/2016  . History of bunionectomy of left great toe 07/15/2015  . H/O hammer toe correction 03/15/2015  . Arthritis of lumbar spine 01/19/2015  . Benign essential HTN 11/05/2014  . Carpal tunnel syndrome 11/05/2014  . Osteoarthritis 11/05/2014  . Dermatitis, eczematoid 11/05/2014  . Depression, major, recurrent, mild ([place]) 11/05/2014  . Dyslipidemia 11/05/2014  . Gastro-esophageal reflux disease without esophagitis 11/05/2014  . Morbid obesity due to excess calories ([place]) 11/05/2014  . Dysmetabolic syndrome  54/56/2563  . Menopausal and perimenopausal disorder 11/05/2014  . Perennial allergic rhinitis with seasonal variation 11/05/2014  . Seborrhea capitis 11/05/2014  . Acquired trigger finger  11/05/2014  . Urinary incontinence in female 11/05/2014  . Vitamin D deficiency 11/05/2014  . Blood glucose elevated 08/31/2009    Thank you for your referral.  Joneen Boers PT, DPT   10/29/2017, 6:29 PM  [facility] [address] [place], [address] Phone: [phone]   Fax:  [phone]  Name: Monique Baker MRN: [id number] Date of Birth: 01/25/1951

## 2017-10-31 ENCOUNTER — Ambulatory Visit: Payer: Medicare Other

## 2017-11-05 ENCOUNTER — Ambulatory Visit: Payer: Medicare Other

## 2017-11-23 ENCOUNTER — Other Ambulatory Visit: Payer: Self-pay | Admitting: Family Medicine

## 2017-11-23 DIAGNOSIS — R05 Cough: Secondary | ICD-10-CM

## 2017-11-23 DIAGNOSIS — R059 Cough, unspecified: Secondary | ICD-10-CM

## 2018-01-14 ENCOUNTER — Encounter: Payer: Self-pay | Admitting: Family Medicine

## 2018-01-14 ENCOUNTER — Ambulatory Visit: Payer: Medicare Other | Admitting: Family Medicine

## 2018-01-14 VITALS — BP 126/72 | HR 85 | Temp 98.8°F | Resp 16 | Ht 62.0 in | Wt 198.4 lb

## 2018-01-14 DIAGNOSIS — R300 Dysuria: Secondary | ICD-10-CM

## 2018-01-14 DIAGNOSIS — Z23 Encounter for immunization: Secondary | ICD-10-CM | POA: Diagnosis not present

## 2018-01-14 LAB — POCT URINALYSIS DIPSTICK
BILIRUBIN UA: NEGATIVE
Blood, UA: NEGATIVE
Glucose, UA: NEGATIVE
KETONES UA: NEGATIVE
NITRITE UA: NEGATIVE
PROTEIN UA: NEGATIVE
Spec Grav, UA: 1.025 (ref 1.010–1.025)
Urobilinogen, UA: 0.2 E.U./dL
pH, UA: 5 (ref 5.0–8.0)

## 2018-01-14 MED ORDER — NITROFURANTOIN MONOHYD MACRO 100 MG PO CAPS: 100.0000 mg | ORAL_CAPSULE | Freq: Two times a day (BID) | ORAL | 0 refills | Status: DC

## 2018-01-14 NOTE — Addendum Note (Signed)
Addended by: Cynda FamiliaJOHNSON, Kailin Leu L on: 01/14/2018 04:45 PM   Modules accepted: Orders

## 2018-01-14 NOTE — Progress Notes (Signed)
Name: Monique Baker   MRN: [id number]    DOB: 05-05-1950   Date:01/14/2018       Progress Note  Subjective  Chief Complaint  Chief Complaint  Patient presents with  . Urinary Tract Infection    Onset-since last week, Burning during urination  . Immunizations    High dose    HPI  Dysuria: she states that last week she noticed dysuria at the end of micturition, no blood in urine, fever, chills or back pain. No history of kidney stones. She states symptoms improved with cranberry juice but returned yesterday , a little better today. No change in bowel movements, denies constipation.    Patient Active Problem List   Diagnosis Date Noted  . Osteopenia of hip 09/04/2016  . History of bunionectomy of left great toe 07/15/2015  . H/O hammer toe correction 03/15/2015  . Arthritis of lumbar spine 01/19/2015  . Benign essential HTN 11/05/2014  . Carpal tunnel syndrome 11/05/2014  . Osteoarthritis 11/05/2014  . Dermatitis, eczematoid 11/05/2014  . Depression, major, recurrent, mild ([facility]) 11/05/2014  . Dyslipidemia 11/05/2014  . Gastro-esophageal reflux disease without esophagitis 11/05/2014  . Morbid obesity due to excess calories ([facility]) 11/05/2014  . Dysmetabolic syndrome 11/05/2014  . Menopausal and perimenopausal disorder 11/05/2014  . Perennial allergic rhinitis with seasonal variation 11/05/2014  . Seborrhea capitis 11/05/2014  . Acquired trigger finger 11/05/2014  . Urinary incontinence in female 11/05/2014  . Vitamin D deficiency 11/05/2014  . Blood glucose elevated 08/31/2009    Past Surgical History:  Procedure Laterality Date  . ABDOMINAL HYSTERECTOMY    . CATARACT EXTRACTION, BILATERAL    . CESAREAN SECTION    . COLONOSCOPY WITH PROPOFOL N/A 03/15/2016   Procedure: COLONOSCOPY WITH PROPOFOL;  Surgeon: Kieth Brightly, MD;  Location: [facility];  Service: Endoscopy;  Laterality: N/A;  . HALLUX VALGUS LAPIDUS Left 06/16/2015   Procedure: HALLUX VALGUS LAPIDUS;   Surgeon: Gwyneth Revels, DPM;  Location[facility];  Service: Podiatry;  Laterality: Left;  WITH POPLITEAL  . HAMMER TOE SURGERY Left 06/16/2015   Procedure: HAMMER TOE CORRECTION SECOND TOE LEFT;  Surgeon: Gwyneth Revels, DPM;  Location: [facility];  Service: Podiatry;  Laterality: Left;  prediabetic - on oral meds  . MYOMECTOMY    . trigger finger Right 12/15/2014  . Vulva cyst  11/2006    Family History  Problem Relation Age of Onset  . Dementia Mother   . Diabetes Father   . Stroke Father   . Cancer Daughter        Fibroid Tumors  . Diabetes Brother   . Breast cancer Neg Hx     Social History   Socioeconomic History  . Marital status: Married    Spouse name: Adela Glimpse  . Number of children: 1  . Years of education: Not on file  . Highest education level: Bachelor's degree (e.g., BA, AB, BS)  Occupational History  . Occupation: retired Runner, broadcasting/film/video    Comment: Hospital doctor  . Financial resource strain: Not hard at all  . Food insecurity:    Worry: Never true    Inability: Never true  . Transportation needs:    Medical: No    Non-medical: No  Tobacco Use  . Smoking status: Never Smoker  . Smokeless tobacco: Never Used  . Tobacco comment: smoking cessation materials not required  Substance and Sexual Activity  . Alcohol use: No    Alcohol/week: 0.0 standard drinks  . Drug use:  No  . Sexual activity: Not Currently    Partners: Male  Lifestyle  . Physical activity:    Days per week: 0 days    Minutes per session: 0 min  . Stress: Rather much  Relationships  . Social connections:    Talks on phone: Patient refused    Gets together: Patient refused    Attends religious service: Patient refused    Active member of club or organization: Patient refused    Attends meetings of clubs or organizations: Patient refused    Relationship status: Married  . Intimate partner violence:    Fear of current or ex partner: No    Emotionally abused:  No    Physically abused: No    Forced sexual activity: No  Other Topics Concern  . 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  acetaminophen (TYLENOL) 500 MG tablet, Take by mouth. Reported on 11/15/2015, Disp: , Rfl:  .  aspirin EC 81 MG tablet, Take 81 mg by mouth daily., Disp: , Rfl:  .  atorvastatin (LIPITOR) 40 MG tablet, Take 1 tablet (40 mg total) by mouth every evening. for cholesterol, Disp: 90 tablet, Rfl: 1 .  buPROPion (WELLBUTRIN XL) 150 MG 24 hr tablet, Take 1 tablet (150 mg total) by mouth daily before breakfast., Disp: 90 tablet, Rfl: 1 .  Cholecalciferol (VITAMIN D PO), Take 1 capsule by mouth daily. , Disp: , Rfl:  .  citalopram (CELEXA) 20 MG tablet, Take 1 tablet (20 mg total) by mouth daily., Disp: 90 tablet, Rfl: 1 .  Cyanocobalamin (B-12) 1000 MCG SUBL, Place 1 each under the tongue once a week. , Disp: , Rfl:  .  fluticasone (FLONASE) 50 MCG/ACT nasal spray, Place 2 sprays into both nostrils daily., Disp: 48 g, Rfl: 0 .  glucose blood (ONETOUCH VERIO) test strip, Use as instructed, Disp: 100 each, Rfl: 12 .  loratadine (CLARITIN) 10 MG tablet, TAKE 1 TABLET (10 MG TOTAL) BY MOUTH DAILY., Disp: 90 tablet, Rfl: 1 .  meloxicam (MOBIC) 15 MG tablet, TAKE 1 TABLET BY MOUTH EVERY DAY, Disp: 30 tablet, Rfl: 0 .  metFORMIN (GLUCOPHAGE) 500 MG tablet, Take 1 tablet (500 mg total) by mouth 2 (two) times daily., Disp: 180 tablet, Rfl: 1 .  nadolol (CORGARD) 20 MG tablet, Take 1 tablet (20 mg total) by mouth every evening., Disp: 90 tablet, Rfl: 1 .  Olmesartan-amLODIPine-HCTZ 20-5-12.5 MG TABS, Take 1 tablet by mouth daily., Disp: 90 tablet, Rfl: 1 .  silver sulfADIAZINE (SILVADENE) 1 % cream, Apply 1 application topically daily., Disp: 50 g, Rfl: 0 .  triamcinolone cream (KENALOG) 0.1 %, Reported on 07/15/2015, Disp: , Rfl:  .  Tdap (BOOSTRIX) 5-2.5-18.5 LF-MCG/0.5 injection,  Boostrix Tdap 2.5 Lf unit-8 mcg-5 Lf/0.5 mL intramuscular syringe, Disp: , Rfl:   Allergies  Allergen Reactions  . Duloxetine     sweat    I personally reviewed active problem list, medication list, allergies, family history, social history with the patient/caregiver today.   ROS  Ten systems reviewed and is negative except as mentioned in HPI   Objective  Vitals:   01/14/18 1607  BP: 126/72  Pulse: 85  Resp: 16  Temp: 98.8 F (37.1 C)  TempSrc: Oral  SpO2: 99%  Weight: 198 lb 6.4 oz (90 kg)  Height: 5\' 2"  (1.575 m)  Body mass index is 36.29 kg/m.  Physical Exam  Constitutional: Patient appears well-developed and well-nourished. Obese  No distress.  HEENT: head atraumatic, normocephalic, pupils equal and reactive to light,  neck supple, throat within normal limits Cardiovascular: Normal rate, regular rhythm and normal heart sounds.  No murmur heard. No BLE edema. Pulmonary/Chest: Effort normal and breath sounds normal. No respiratory distress. Abdominal: Soft.  There is no tenderness. Negative CVA tenderness  Psychiatric: Patient has a normal mood and affect. behavior is normal. Judgment and thought content normal.  Recent Results (from the past 2160 hour(s))  POCT HgB A1C     Status: Normal   Collection Time: 10/23/17  9:15 AM  Result Value Ref Range   Hemoglobin A1C  4.0 - 5.6 %   HbA1c POC (<> result, manual entry)  4.0 - 5.6 %   HbA1c, POC (prediabetic range) 5.8 5.7 - 6.4 %   HbA1c, POC (controlled diabetic range)  0.0 - 7.0 %  POCT urinalysis dipstick     Status: Abnormal   Collection Time: 01/14/18  4:06 PM  Result Value Ref Range   Color, UA amber    Clarity, UA clear    Glucose, UA Negative Negative   Bilirubin, UA negative    Ketones, UA negative    Spec Grav, UA 1.025 1.010 - 1.025   Blood, UA negative    pH, UA 5.0 5.0 - 8.0   Protein, UA Negative Negative   Urobilinogen, UA 0.2 0.2 or 1.0 E.U./dL   Nitrite, UA neg    Leukocytes, UA  Moderate (2+) (A) Negative   Appearance clear    Odor strong      PHQ2/9: Depression screen [facility] 2/9 01/14/2018 10/23/2017 10/12/2017 07/23/2017 03/19/2017  Decreased Interest 0 0 3 0 0  Down, Depressed, Hopeless 0 2 3 3  0  PHQ - 2 Score 0 2 6 3  0  Altered sleeping 0 0 1 0 -  Tired, decreased energy 3 2 3 2  -  Change in appetite 0 0 0 0 -  Feeling bad or failure about yourself  0 1 0 0 -  Trouble concentrating 0 0 0 0 -  Moving slowly or fidgety/restless 0 0 0 0 -  Suicidal thoughts 0 0 0 0 -  PHQ-9 Score 3 5 10 5  -  Difficult doing work/chores Not difficult at all Not difficult at all Not difficult at all Somewhat difficult -  Some recent data might be hidden     Fall Risk: Fall Risk  01/14/2018 10/23/2017 10/12/2017 07/23/2017 07/23/2017  Falls in the past year? No Yes Yes - Yes  Comment - - - - -  Number falls in past yr: - 1 1 - 2 or more  Injury with Fall? - Yes No - Yes  Comment - - - - -  Risk for fall due to : - - Impaired vision;Medication side effect;Impaired balance/gait History of fall(s);Impaired balance/gait -  Risk for fall due to: Comment - - wears eyeglasses; recent R hip replacement - -  Follow up - - Falls evaluation completed;Education provided;Falls prevention discussed Education provided;Falls prevention discussed Education provided      Functional Status Survey: Is the patient deaf or have difficulty hearing?: No Does the patient have difficulty seeing, even when wearing glasses/contacts?: Yes Does the patient have difficulty concentrating, remembering, or making decisions?: No Does the patient have difficulty walking or climbing stairs?: No Does the patient have difficulty dressing or bathing?: No Does the patient have difficulty  doing errands alone such as visiting a doctor's office or shopping?: No    Assessment & Plan  1. Burning with urination  - POCT urinalysis dipstick - CULTURE, URINE COMPREHENSIVE - nitrofurantoin, macrocrystal-monohydrate,  (MACROBID) 100 MG capsule; Take 1 capsule (100 mg total) by mouth 2 (two) times daily.  Dispense: 10 capsule; Refill: 0  2. Need for influenza vaccination  - Flu vaccine HIGH DOSE PF

## 2018-01-15 ENCOUNTER — Other Ambulatory Visit: Payer: Self-pay | Admitting: Family Medicine

## 2018-01-15 DIAGNOSIS — F330 Major depressive disorder, recurrent, mild: Secondary | ICD-10-CM

## 2018-01-15 LAB — URINE CULTURE
MICRO NUMBER: 91109470
SPECIMEN QUALITY: ADEQUATE

## 2018-01-23 ENCOUNTER — Encounter: Payer: Self-pay | Admitting: Family Medicine

## 2018-01-23 ENCOUNTER — Ambulatory Visit: Payer: Medicare Other | Admitting: Family Medicine

## 2018-01-23 VITALS — BP 120/80 | HR 96 | Temp 98.1°F | Ht 62.0 in | Wt 199.8 lb

## 2018-01-23 DIAGNOSIS — R739 Hyperglycemia, unspecified: Secondary | ICD-10-CM | POA: Diagnosis not present

## 2018-01-23 DIAGNOSIS — E785 Hyperlipidemia, unspecified: Secondary | ICD-10-CM

## 2018-01-23 DIAGNOSIS — I10 Essential (primary) hypertension: Secondary | ICD-10-CM

## 2018-01-23 DIAGNOSIS — E8881 Metabolic syndrome: Secondary | ICD-10-CM

## 2018-01-23 DIAGNOSIS — F330 Major depressive disorder, recurrent, mild: Secondary | ICD-10-CM

## 2018-01-23 DIAGNOSIS — E559 Vitamin D deficiency, unspecified: Secondary | ICD-10-CM

## 2018-01-23 DIAGNOSIS — E538 Deficiency of other specified B group vitamins: Secondary | ICD-10-CM

## 2018-01-23 DIAGNOSIS — F411 Generalized anxiety disorder: Secondary | ICD-10-CM

## 2018-01-23 DIAGNOSIS — M1611 Unilateral primary osteoarthritis, right hip: Secondary | ICD-10-CM

## 2018-01-23 MED ORDER — ATORVASTATIN CALCIUM 40 MG PO TABS: 40.0000 mg | ORAL_TABLET | Freq: Every evening | ORAL | 1 refills | Status: DC

## 2018-01-23 MED ORDER — NADOLOL 20 MG PO TABS: 20.0000 mg | ORAL_TABLET | Freq: Every evening | ORAL | 1 refills | Status: DC

## 2018-01-23 MED ORDER — OLMESARTAN-AMLODIPINE-HCTZ 20-5-12.5 MG PO TABS: 1.0000 | ORAL_TABLET | Freq: Every day | ORAL | 1 refills | Status: DC

## 2018-01-23 MED ORDER — CITALOPRAM HYDROBROMIDE 20 MG PO TABS: 20.0000 mg | ORAL_TABLET | Freq: Every day | ORAL | 1 refills | Status: DC

## 2018-01-23 MED ORDER — BUPROPION HCL ER (XL) 150 MG PO TB24: 150.0000 mg | ORAL_TABLET | Freq: Every day | ORAL | 1 refills | Status: DC

## 2018-01-23 NOTE — Progress Notes (Signed)
Name: Monique Baker   MRN: [id number]    DOB: 1951/03/18   Date:01/23/2018       Progress Note  Subjective  Chief Complaint  Chief Complaint  Patient presents with  . Form Completion    for surgical clearance    HPI  Pre diabetes/metabolic syndrome: she is taking Metformin, denies side effects, hgbA1C was 5.9%, no polyphagia, polydipsia or polyuria. Also going to weight watchers,weight has been stable.   Right hip pain/OA: aggravated by activity, such as standing for a long time, or sitting for a prolonged period of time,  walking helps with pain, PT also helped. She is back on Meloxicam given by Dr. Odis Luster and will have right hip surgery in Nov   Extreme obesity:she had lost 16 lbs since she joined weight watchers 05/2016 and is doing well. Not drinking sweet beverages on a regular basis, counting points, eating more fruit and vegetables, taking some protein shakes.Weight has been unchanged since last visit.    HTN: taking bp medication, denies side effects, no chest pain, no palpitation. She is complaint with medication. BP when she checks at local pharmacies always normal. Today also within normal limits  Depression Major: she is married to a Education officer, environmental, he also works as a Airline pilot at [employer]. She is only baby-sitting grandchildren prn and that is not as stressful now. We started her on Cymbalta 10/2015 and is feeling better no longer has anhedonia, motivation is back, no longer having crying spells, and not feeling so hungry, however has hot flashes and hair loss and is concerned about possible side effects, we switched to Celexaback in Fall 2018and hot flashes resolved and hair is growing again. We also added Wellbutrin  March 2019 but continues to feel down, phq 9 is a little better but she still feels tired, wants to escape her problems, thinking about going back to work so she does not need to help everyone. Discussed importance of therapy again    Patient Active  Problem List   Diagnosis Date Noted  . Osteopenia of hip 09/04/2016  . History of bunionectomy of left great toe 07/15/2015  . H/O hammer toe correction 03/15/2015  . Arthritis of lumbar spine 01/19/2015  . Benign essential HTN 11/05/2014  . Carpal tunnel syndrome 11/05/2014  . Osteoarthritis 11/05/2014  . Dermatitis, eczematoid 11/05/2014  . Depression, major, recurrent, mild (HCC) 11/05/2014  . Dyslipidemia 11/05/2014  . Gastro-esophageal reflux disease without esophagitis 11/05/2014  . Morbid obesity due to excess calories (HCC) 11/05/2014  . Dysmetabolic syndrome 11/05/2014  . Menopausal and perimenopausal disorder 11/05/2014  . Perennial allergic rhinitis with seasonal variation 11/05/2014  . Seborrhea capitis 11/05/2014  . Acquired trigger finger 11/05/2014  . Urinary incontinence in female 11/05/2014  . Vitamin D deficiency 11/05/2014  . Blood glucose elevated 08/31/2009    Past Surgical History:  Procedure Laterality Date  . ABDOMINAL HYSTERECTOMY    . CATARACT EXTRACTION, BILATERAL    . CESAREAN SECTION    . COLONOSCOPY WITH PROPOFOL N/A 03/15/2016   Procedure: COLONOSCOPY WITH PROPOFOL;  Surgeon: Kieth Brightly, MD;  Location: [facility];  Service: Endoscopy;  Laterality: N/A;  . HALLUX VALGUS LAPIDUS Left 06/16/2015   Procedure: HALLUX VALGUS LAPIDUS;  Surgeon: Gwyneth Revels, DPM;  Location: [facility];  Service: Podiatry;  Laterality: Left;  WITH POPLITEAL  . HAMMER TOE SURGERY Left 06/16/2015   Procedure: HAMMER TOE CORRECTION SECOND TOE LEFT;  Surgeon: Gwyneth Revels, DPM;  Location: [facility];  Service: Podiatry;  Laterality: Left;  prediabetic - on oral meds  . MYOMECTOMY    . trigger finger Right 12/15/2014  . Vulva cyst  11/2006    Family History  Problem Relation Age of Onset  . Dementia Mother   . Diabetes Father   . Stroke Father   . Cancer Daughter        Fibroid Tumors  . Diabetes Brother   . Breast cancer Neg Hx      Social History   Socioeconomic History  . Marital status: Married    Spouse name: Adela Glimpse  . Number of children: 1  . Years of education: Not on file  . Highest education level: Bachelor's degree (e.g., BA, AB, BS)  Occupational History  . Occupation: retired Runner, broadcasting/film/video    Comment: Hospital doctor  . Financial resource strain: Not hard at all  . Food insecurity:    Worry: Never true    Inability: Never true  . Transportation needs:    Medical: No    Non-medical: No  Tobacco Use  . Smoking status: Never Smoker  . Smokeless tobacco: Never Used  . Tobacco comment: smoking cessation materials not required  Substance and Sexual Activity  . Alcohol use: No    Alcohol/week: 0.0 standard drinks  . Drug use: No  . Sexual activity: Not Currently    Partners: Male  Lifestyle  . Physical activity:    Days per week: 0 days    Minutes per session: 0 min  . Stress: Rather much  Relationships  . Social connections:    Talks on phone: Patient refused    Gets together: Patient refused    Attends religious service: Patient refused    Active member of club or organization: Patient refused    Attends meetings of clubs or organizations: Patient refused    Relationship status: Married  . Intimate partner violence:    Fear of current or ex partner: No    Emotionally abused: No    Physically abused: No    Forced sexual activity: No  Other Topics Concern  . Not on file  Social History Narrative   Married to a Education officer, environmental and he is a professor at [employer] Armed forces training and education officer )      Current Outpatient Medications:  .  acetaminophen (TYLENOL) 500 MG tablet, Take by mouth. Reported on 11/15/2015, Disp: , Rfl:  .  aspirin EC 81 MG tablet, Take 81 mg by mouth daily., Disp: , Rfl:  .  atorvastatin (LIPITOR) 40 MG tablet, Take 1 tablet (40 mg total) by mouth every evening. for cholesterol, Disp: 90 tablet, Rfl: 1 .  buPROPion (WELLBUTRIN XL) 150 MG 24 hr tablet, Take 1 tablet (150 mg total) by  mouth daily before breakfast., Disp: 90 tablet, Rfl: 1 .  Cholecalciferol (VITAMIN D PO), Take 1 capsule by mouth daily. , Disp: , Rfl:  .  citalopram (CELEXA) 20 MG tablet, Take 1 tablet (20 mg total) by mouth daily., Disp: 90 tablet, Rfl: 1 .  Cyanocobalamin (B-12) 1000 MCG SUBL, Place 1 each under the tongue once a week. , Disp: , Rfl:  .  fluticasone (FLONASE) 50 MCG/ACT nasal spray, Place 2 sprays into both nostrils daily., Disp: 48 g, Rfl: 0 .  glucose blood (ONETOUCH VERIO) test strip, Use as instructed, Disp: 100 each, Rfl: 12 .  loratadine (CLARITIN) 10 MG tablet, TAKE 1 TABLET (10 MG TOTAL) BY MOUTH DAILY., Disp: 90 tablet, Rfl: 1 .  meloxicam (MOBIC) 15 MG tablet, TAKE  1 TABLET BY MOUTH EVERY DAY, Disp: 30 tablet, Rfl: 0 .  metFORMIN (GLUCOPHAGE) 500 MG tablet, Take 1 tablet (500 mg total) by mouth 2 (two) times daily., Disp: 180 tablet, Rfl: 1 .  nadolol (CORGARD) 20 MG tablet, Take 1 tablet (20 mg total) by mouth every evening., Disp: 90 tablet, Rfl: 1 .  Olmesartan-amLODIPine-HCTZ 20-5-12.5 MG TABS, Take 1 tablet by mouth daily., Disp: 90 tablet, Rfl: 1 .  Tdap (BOOSTRIX) 5-2.5-18.5 LF-MCG/0.5 injection, Boostrix Tdap 2.5 Lf unit-8 mcg-5 Lf/0.5 mL intramuscular syringe, Disp: , Rfl:  .  triamcinolone cream (KENALOG) 0.1 %, Reported on 07/15/2015, Disp: , Rfl:   Allergies  Allergen Reactions  . Duloxetine     sweat    I personally reviewed active problem list, medication list, allergies, family history, social history with the patient/caregiver today.   ROS  Constitutional: Negative for fever or weight change.  Respiratory: Negative for cough and shortness of breath.   Cardiovascular: Negative for chest pain or palpitations.  Gastrointestinal: Negative for abdominal pain, no bowel changes.  Musculoskeletal: Positive for intermittent gait problem but no  joint swelling.  Skin: Negative for rash.  Neurological: Negative for dizziness or headache.  No other specific  complaints in a complete review of systems (except as listed in HPI above).  Objective  Vitals:   01/23/18 0915  BP: 120/80  Pulse: 96  Temp: 98.1 F (36.7 C)  TempSrc: Oral  SpO2: 99%  Weight: 199 lb 12.8 oz (90.6 kg)  Height: 5\' 2"  (1.575 m)    Body mass index is 36.54 kg/m.  Physical Exam  Constitutional: Patient appears well-developed and well-nourished. Obese No distress.  HEENT: head atraumatic, normocephalic, pupils equal and reactive to light,  neck supple, throat within normal limits Cardiovascular: Normal rate, regular rhythm and normal heart sounds.  No murmur heard. No BLE edema. Pulmonary/Chest: Effort normal and breath sounds normal. No respiratory distress. Abdominal: Soft.  There is no tenderness. Muscular skeletal: decrease rom of right hip Psychiatric: Patient has a normal mood and affect. behavior is normal. Judgment and thought content normal.  Recent Results (from the past 2160 hour(s))  POCT urinalysis dipstick     Status: Abnormal   Collection Time: 01/14/18  4:06 PM  Result Value Ref Range   Color, UA amber    Clarity, UA clear    Glucose, UA Negative Negative   Bilirubin, UA negative    Ketones, UA negative    Spec Grav, UA 1.025 1.010 - 1.025   Blood, UA negative    pH, UA 5.0 5.0 - 8.0   Protein, UA Negative Negative   Urobilinogen, UA 0.2 0.2 or 1.0 E.U./dL   Nitrite, UA neg    Leukocytes, UA Moderate (2+) (A) Negative   Appearance clear    Odor strong   Urine Culture     Status: None   Collection Time: 01/14/18  4:52 PM  Result Value Ref Range   MICRO NUMBER: [id number]    SPECIMEN QUALITY: ADEQUATE    Sample Source URINE    STATUS: FINAL    ISOLATE 1:      Multiple organisms present, each less than 10,000 CFU/mL. These organisms, commonly found on external and internal genitalia, are considered to be colonizers. No further testing performed.      PHQ2/9: [facility] 01/14/2018 10/23/2017 10/12/2017 07/23/2017  03/19/2017  Decreased Interest 0 0 3 0 0  Down, Depressed, Hopeless 0 2 3 3  0  PHQ - 2 Score 0  2 6 3  0  Altered sleeping 0 0 1 0 -  Tired, decreased energy 3 2 3 2  -  Change in appetite 0 0 0 0 -  Feeling bad or failure about yourself  0 1 0 0 -  Trouble concentrating 0 0 0 0 -  Moving slowly or fidgety/restless 0 0 0 0 -  Suicidal thoughts 0 0 0 0 -  PHQ-9 Score 3 5 10 5  -  Difficult doing work/chores Not difficult at all Not difficult at all Not difficult at all Somewhat difficult -  Some recent data might be hidden     Fall Risk: Fall Risk  01/23/2018 01/14/2018 10/23/2017 10/12/2017 07/23/2017  Falls in the past year? No No Yes Yes -  Comment - - - - -  Number falls in past yr: - - 1 1 -  Injury with Fall? - - Yes No -  Comment - - - - -  Risk for fall due to : - - - Impaired vision;Medication side effect;Impaired balance/gait History of fall(s);Impaired balance/gait  Risk for fall due to: Comment - - - wears eyeglasses; recent R hip replacement -  Follow up - - - Falls evaluation completed;Education provided;Falls prevention discussed Education provided;Falls prevention discussed     Functional Status Survey: Is the patient deaf or have difficulty hearing?: No Does the patient have difficulty seeing, even when wearing glasses/contacts?: No Does the patient have difficulty concentrating, remembering, or making decisions?: No Does the patient have difficulty walking or climbing stairs?: No Does the patient have difficulty dressing or bathing?: No Does the patient have difficulty doing errands alone such as visiting a doctor's office or shopping?: No    Assessment & Plan  1. Dysmetabolic syndrome  Recheck hgbA1C next visit, too soon today   2. Hyperglycemia  - Lipid panel  3. Benign essential HTN  - COMPLETE METABOLIC PANEL WITH GFR - CBC with Differential/Platelet - nadolol (CORGARD) 20 MG tablet; Take 1 tablet (20 mg total) by mouth every evening.  Dispense: 90  tablet; Refill: 1 - Olmesartan-amLODIPine-HCTZ 20-5-12.5 MG TABS; Take 1 tablet by mouth daily.  Dispense: 90 tablet; Refill: 1 - EKG 12-Lead  4. Morbid obesity due to excess calories [facility])  Discussed with the patient the risk posed by an increased BMI. Discussed importance of portion control, calorie counting and at least 150 minutes of physical activity weekly. Avoid sweet beverages and drink more water. Eat at least 6 servings of fruit and vegetables daily   5. Dyslipidemia  - atorvastatin (LIPITOR) 40 MG tablet; Take 1 tablet (40 mg total) by mouth every evening. for cholesterol  Dispense: 90 tablet; Refill: 1  6. Primary osteoarthritis of right hip  Going to have surgery Nov 2019 by Dr. Odis Luster  7. Depression, major, recurrent, mild (HCC)  - citalopram (CELEXA) 20 MG tablet; Take 1 tablet (20 mg total) by mouth daily.  Dispense: 90 tablet; Refill: 1 - buPROPion (WELLBUTRIN XL) 150 MG 24 hr tablet; Take 1 tablet (150 mg total) by mouth daily before breakfast.  Dispense: 90 tablet; Refill: 1  8. B12 deficiency  - Vitamin B12  9. Vitamin D deficiency  Continue otc supplementation  10. GAD (generalized anxiety disorder)  - citalopram (CELEXA) 20 MG tablet; Take 1 tablet (20 mg total) by mouth daily.  Dispense: 90 tablet; Refill: 1

## 2018-01-23 NOTE — Patient Instructions (Signed)
Call [facility] and see if you can see them with your insurance   [employer] book or website

## 2018-01-24 LAB — CBC WITH DIFFERENTIAL/PLATELET
BASOS ABS: 45 {cells}/uL (ref 0–200)
Basophils Relative: 0.5 %
Eosinophils Absolute: 223 cells/uL (ref 15–500)
Eosinophils Relative: 2.5 %
HCT: 38.7 % (ref 35.0–45.0)
HEMOGLOBIN: 12.8 g/dL (ref 11.7–15.5)
LYMPHS ABS: 1344 {cells}/uL (ref 850–3900)
MCH: 27.1 pg (ref 27.0–33.0)
MCHC: 33.1 g/dL (ref 32.0–36.0)
MCV: 81.8 fL (ref 80.0–100.0)
MPV: 9.8 fL (ref 7.5–12.5)
Monocytes Relative: 9.6 %
NEUTROS PCT: 72.3 %
Neutro Abs: 6435 cells/uL (ref 1500–7800)
PLATELETS: 390 10*3/uL (ref 140–400)
RBC: 4.73 10*6/uL (ref 3.80–5.10)
RDW: 12.7 % (ref 11.0–15.0)
TOTAL LYMPHOCYTE: 15.1 %
WBC: 8.9 10*3/uL (ref 3.8–10.8)
WBCMIX: 854 {cells}/uL (ref 200–950)

## 2018-01-24 LAB — LIPID PANEL
CHOL/HDL RATIO: 2.1 (calc) (ref <5.0)
Cholesterol: 124 mg/dL (ref <200)
HDL: 59 mg/dL (ref >50)
LDL CHOLESTEROL (CALC): 47 mg/dL
Non-HDL Cholesterol (Calc): 65 mg/dL (calc) (ref <130)
Triglycerides: 96 mg/dL (ref <150)

## 2018-01-24 LAB — COMPLETE METABOLIC PANEL WITH GFR
AG Ratio: 1.4 (calc) (ref 1.0–2.5)
ALBUMIN MSPROF: 4.2 g/dL (ref 3.6–5.1)
ALT: 12 U/L (ref 6–29)
AST: 17 U/L (ref 10–35)
Alkaline phosphatase (APISO): 77 U/L (ref 33–130)
BUN: 14 mg/dL (ref 7–25)
CO2: 31 mmol/L (ref 20–32)
CREATININE: 0.71 mg/dL (ref 0.50–0.99)
Calcium: 10.1 mg/dL (ref 8.6–10.4)
Chloride: 101 mmol/L (ref 98–110)
GFR, EST NON AFRICAN AMERICAN: 89 mL/min/{1.73_m2} (ref >=60)
GFR, Est African American: 103 mL/min/{1.73_m2} (ref >=60)
GLUCOSE: 82 mg/dL (ref 65–99)
Globulin: 2.9 g/dL (calc) (ref 1.9–3.7)
Potassium: 4.2 mmol/L (ref 3.5–5.3)
SODIUM: 141 mmol/L (ref 135–146)
Total Bilirubin: 0.9 mg/dL (ref 0.2–1.2)
Total Protein: 7.1 g/dL (ref 6.1–8.1)

## 2018-01-24 LAB — VITAMIN B12: VITAMIN B 12: 1133 pg/mL — AB (ref 200–1100)

## 2018-02-06 ENCOUNTER — Encounter: Payer: Self-pay | Admitting: Family Medicine

## 2018-02-06 ENCOUNTER — Ambulatory Visit (INDEPENDENT_AMBULATORY_CARE_PROVIDER_SITE_OTHER): Payer: Medicare Other | Admitting: Family Medicine

## 2018-02-06 VITALS — BP 118/64 | HR 87 | Temp 98.5°F | Resp 16 | Ht 62.0 in | Wt 196.4 lb

## 2018-02-06 DIAGNOSIS — Z1231 Encounter for screening mammogram for malignant neoplasm of breast: Secondary | ICD-10-CM | POA: Diagnosis not present

## 2018-02-06 DIAGNOSIS — Z01419 Encounter for gynecological examination (general) (routine) without abnormal findings: Secondary | ICD-10-CM | POA: Diagnosis not present

## 2018-02-06 NOTE — Progress Notes (Signed)
Name: Monique Baker   MRN: [id number]    DOB: 1951-02-03   Date:02/06/2018       Progress Note  Subjective  Chief Complaint  Chief Complaint  Patient presents with  . Annual Exam    HPI   Patient presents for annual CPE   Exercise: she has been parking further away, taking stairs, and looking forwards to having hip surgery so she can be more active     Office Visit from 01/14/2018 in [facility]  AUDIT-C Score  0     Depression:  Depression screen [facility] 2/9 02/06/2018 01/14/2018 10/23/2017 10/12/2017 07/23/2017  Decreased Interest 0 0 0 3 0  Down, Depressed, Hopeless 0 0 '2 3 3  ' PHQ - 2 Score 0 0 '2 6 3  ' Altered sleeping 0 0 0 1 0  Tired, decreased energy '2 3 2 3 2  ' Change in appetite 0 0 0 0 0  Feeling bad or failure about yourself  0 0 1 0 0  Trouble concentrating 0 0 0 0 0  Moving slowly or fidgety/restless 0 0 0 0 0  Suicidal thoughts 0 0 0 0 0  PHQ-9 Score '2 3 5 10 5  ' Difficult doing work/chores Not difficult at all Not difficult at all Not difficult at all Not difficult at all Somewhat difficult  Some recent data might be hidden   Hypertension: BP Readings from Last 3 Encounters:  02/06/18 118/64  01/23/18 120/80  01/14/18 126/72   Obesity: Wt Readings from Last 3 Encounters:  02/06/18 196 lb 6.4 oz (89.1 kg)  01/23/18 199 lb 12.8 oz (90.6 kg)  01/14/18 198 lb 6.4 oz (90 kg)   BMI Readings from Last 3 Encounters:  02/06/18 35.92 kg/m  01/23/18 36.54 kg/m  01/14/18 36.29 kg/m    Hep C Screening: up to date STD testing and prevention (HIV/chl/gon/syphilis): N/A Intimate partner violence: negative  Sexual History/Pain during Intercourse:not sexually active in over one year Menstrual History/LMP/Abnormal Bleeding: discussed importance of being seen if any vaginal or rectal bleeding Incontinence Symptoms:  None   Advanced Care Planning: A voluntary discussion about advance care planning including the explanation and discussion of advance  directives.  Discussed health care proxy and Living will, and the patient was able to identify a health care proxy as husband   Patient does not have a living will at present time. Breast cancer:  Due in Nov BRCA gene screening: N/A Cervical cancer screening: s/p hysterectomy   Osteoporosis Screening:  HM Dexa Scan  Date Value Ref Range Status  12/21/2008 Normal  Final    Lipids:  Lab Results  Component Value Date   CHOL 124 01/23/2018   CHOL 98 06/08/2016   CHOL 112 (L) 11/15/2015   Lab Results  Component Value Date   HDL 59 01/23/2018   HDL 47 (L) 06/08/2016   HDL 58 11/15/2015   Lab Results  Component Value Date   LDLCALC 47 01/23/2018   LDLCALC 33 06/08/2016   LDLCALC 35 11/15/2015   Lab Results  Component Value Date   TRIG 96 01/23/2018   TRIG 91 06/08/2016   TRIG 94 11/15/2015   Lab Results  Component Value Date   CHOLHDL 2.1 01/23/2018   CHOLHDL 2.1 06/08/2016   CHOLHDL 1.9 11/15/2015   No results found for: LDLDIRECT  Glucose:  Glucose, Bld  Date Value Ref Range Status  01/23/2018 82 65 - 99 mg/dL Final    Comment:    .  Fasting reference interval .   03/19/2017 97 65 - 99 mg/dL Final    Comment:    .            Fasting reference interval .   11/14/2016 89 65 - 99 mg/dL Final   Glucose-Capillary  Date Value Ref Range Status  03/15/2016 110 (H) 65 - 99 mg/dL Final  06/16/2015 80 65 - 99 mg/dL Final  06/16/2015 93 65 - 99 mg/dL Final    Skin cancer: discussed atypical lesions Colorectal cancer: repeat in 2027 Lung cancer:   Low Dose CT Chest recommended if Age 55-80 years, 30 pack-year currently smoking OR have quit w/in 15years. Patient does not qualify.   ECG: up to date  Patient Active Problem List   Diagnosis Date Noted  . Osteopenia of hip 09/04/2016  . History of bunionectomy of left great toe 07/15/2015  . H/O hammer toe correction 03/15/2015  . Arthritis of lumbar spine 01/19/2015  . Benign essential HTN 11/05/2014   . Carpal tunnel syndrome 11/05/2014  . Osteoarthritis 11/05/2014  . Dermatitis, eczematoid 11/05/2014  . Depression, major, recurrent, mild ([place]) 11/05/2014  . Dyslipidemia 11/05/2014  . Gastro-esophageal reflux disease without esophagitis 11/05/2014  . Morbid obesity due to excess calories ([place]) 11/05/2014  . Dysmetabolic syndrome 11/94/1740  . Menopausal and perimenopausal disorder 11/05/2014  . Perennial allergic rhinitis with seasonal variation 11/05/2014  . Seborrhea capitis 11/05/2014  . Acquired trigger finger 11/05/2014  . Urinary incontinence in female 11/05/2014  . Vitamin D deficiency 11/05/2014  . Blood glucose elevated 08/31/2009    Past Surgical History:  Procedure Laterality Date  . ABDOMINAL HYSTERECTOMY    . CATARACT EXTRACTION, BILATERAL    . CESAREAN SECTION    . COLONOSCOPY WITH PROPOFOL N/A 03/15/2016   Procedure: COLONOSCOPY WITH PROPOFOL;  Surgeon: Christene Lye, MD;  Location: [facility] ENDOSCOPY;  Service: Endoscopy;  Laterality: N/A;  . HALLUX VALGUS LAPIDUS Left 06/16/2015   Procedure: HALLUX VALGUS LAPIDUS;  Surgeon: Samara Deist, DPM;  Location: [place];  Service: Podiatry;  Laterality: Left;  WITH POPLITEAL  . HAMMER TOE SURGERY Left 06/16/2015   Procedure: HAMMER TOE CORRECTION SECOND TOE LEFT;  Surgeon: Samara Deist, DPM;  Location: [place];  Service: Podiatry;  Laterality: Left;  prediabetic - on oral meds  . MYOMECTOMY    . trigger finger Right 12/15/2014  . Vulva cyst  11/2006    Family History  Problem Relation Age of Onset  . Dementia Mother   . Diabetes Father   . Stroke Father   . Cancer Daughter        Fibroid Tumors  . Diabetes Brother   . Breast cancer Neg Hx     Social History   Socioeconomic History  . Marital status: Married    Spouse name: Ilona Sorrel  . Number of children: 1  . Years of education: Not on file  . Highest education level: Bachelor's degree (e.g., BA, AB, BS)  Occupational History   . Occupation: retired Pharmacist, hospital    Comment: [employer]  . Financial resource strain: Not hard at all  . Food insecurity:    Worry: Never true    Inability: Never true  . Transportation needs:    Medical: No    Non-medical: No  Tobacco Use  . Smoking status: Never Smoker  . Smokeless tobacco: Never Used  . Tobacco comment: smoking cessation materials not required  Substance and Sexual Activity  . Alcohol use: No  Alcohol/week: 0.0 standard drinks  . Drug use: No  . Sexual activity: Not Currently    Partners: Male  Lifestyle  . Physical activity:    Days per week: 0 days    Minutes per session: 0 min  . Stress: Rather much  Relationships  . Social connections:    Talks on phone: Patient refused    Gets together: Patient refused    Attends religious service: Patient refused    Active member of club or organization: Patient refused    Attends meetings of clubs or organizations: Patient refused    Relationship status: Married  . Intimate partner violence:    Fear of current or ex partner: No    Emotionally abused: No    Physically abused: No    Forced sexual activity: No  Other Topics Concern  . 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  acetaminophen (TYLENOL) 500 MG tablet, Take by mouth. Reported on 11/15/2015, Disp: , Rfl:  .  aspirin EC 81 MG tablet, Take 81 mg by mouth daily., Disp: , Rfl:  .  atorvastatin (LIPITOR) 40 MG tablet, Take 1 tablet (40 mg total) by mouth every evening. for cholesterol, Disp: 90 tablet, Rfl: 1 .  buPROPion (WELLBUTRIN XL) 150 MG 24 hr tablet, Take 1 tablet (150 mg total) by mouth daily before breakfast., Disp: 90 tablet, Rfl: 1 .  Cholecalciferol (VITAMIN D PO), Take 1 capsule by mouth daily. , Disp: , Rfl:  .  citalopram (CELEXA) 20 MG tablet, Take 1 tablet (20 mg total) by mouth daily., Disp: 90 tablet, Rfl: 1 .   Cyanocobalamin (B-12) 1000 MCG SUBL, Place 1 each under the tongue once a week. , Disp: , Rfl:  .  fluticasone (FLONASE) 50 MCG/ACT nasal spray, Place 2 sprays into both nostrils daily., Disp: 48 g, Rfl: 0 .  glucose blood (ONETOUCH VERIO) test strip, Use as instructed, Disp: 100 each, Rfl: 12 .  loratadine (CLARITIN) 10 MG tablet, TAKE 1 TABLET (10 MG TOTAL) BY MOUTH DAILY., Disp: 90 tablet, Rfl: 1 .  meloxicam (MOBIC) 15 MG tablet, TAKE 1 TABLET BY MOUTH EVERY DAY, Disp: 30 tablet, Rfl: 0 .  metFORMIN (GLUCOPHAGE) 500 MG tablet, Take 1 tablet (500 mg total) by mouth 2 (two) times daily., Disp: 180 tablet, Rfl: 1 .  nadolol (CORGARD) 20 MG tablet, Take 1 tablet (20 mg total) by mouth every evening., Disp: 90 tablet, Rfl: 1 .  Olmesartan-amLODIPine-HCTZ 20-5-12.5 MG TABS, Take 1 tablet by mouth daily., Disp: 90 tablet, Rfl: 1 .  Tdap (BOOSTRIX) 5-2.5-18.5 LF-MCG/0.5 injection, Boostrix Tdap 2.5 Lf unit-8 mcg-5 Lf/0.5 mL intramuscular syringe, Disp: , Rfl:  .  triamcinolone cream (KENALOG) 0.1 %, Reported on 07/15/2015, Disp: , Rfl:   Allergies  Allergen Reactions  . Duloxetine     sweat     ROS  Constitutional: Negative for fever or weight change.  Respiratory: Negative for cough and shortness of breath.   Cardiovascular: Negative for chest pain or palpitations.  Gastrointestinal: Negative for abdominal pain, no bowel changes.  Musculoskeletal: Negative for gait problem or joint swelling.  Skin: Negative for rash.  Neurological: Negative for dizziness or headache.  No other specific complaints in a complete review of systems (except as listed in HPI above).  Objective  Vitals:   02/06/18 1344  BP: 118/64  Pulse: 87  Resp: 16  Temp: 98.5 F (36.9 C)  TempSrc: Oral  SpO2: 99%  Weight: 196 lb 6.4 oz (89.1 kg)  Height: '5\' 2"'  (1.575 m)    Body mass index is 35.92 kg/m.  Physical Exam  Constitutional: Patient appears well-developed and obese. No distress.  HENT: Head:  Normocephalic and atraumatic. Ears: B TMs ok, no erythema or effusion; Nose: Nose normal. Mouth/Throat: Oropharynx is clear and moist. No oropharyngeal exudate.  Eyes: Conjunctivae and EOM are normal. Pupils are equal, round, and reactive to light. No scleral icterus.  Neck: Normal range of motion. Neck supple. No JVD present. No thyromegaly present.  Cardiovascular: Normal rate, regular rhythm and normal heart sounds.  No murmur heard. No BLE edema. Pulmonary/Chest: Effort normal and breath sounds normal. No respiratory distress. Abdominal: Soft. Bowel sounds are normal, no distension. There is no tenderness. no masses Breast: no lumps or masses, no nipple discharge or rashes FEMALE GENITALIA:  External genitalia normal External urethra normal Pelvic not done RECTAL: normal external exam  Musculoskeletal: Normal range of motion, no joint effusions. No gross deformities Neurological: he is alert and oriented to person, place, and time. No cranial nerve deficit. Coordination, balance, strength, speech and gait are normal.  Skin: Skin is warm and dry. No rash noted. No erythema.  Psychiatric: Patient has a normal mood and affect. behavior is normal. Judgment and thought content normal.   Recent Results (from the past 2160 hour(s))  POCT urinalysis dipstick     Status: Abnormal   Collection Time: 01/14/18  4:06 PM  Result Value Ref Range   Color, UA amber    Clarity, UA clear    Glucose, UA Negative Negative   Bilirubin, UA negative    Ketones, UA negative    Spec Grav, UA 1.025 1.010 - 1.025   Blood, UA negative    pH, UA 5.0 5.0 - 8.0   Protein, UA Negative Negative   Urobilinogen, UA 0.2 0.2 or 1.0 E.U./dL   Nitrite, UA neg    Leukocytes, UA Moderate (2+) (A) Negative   Appearance clear    Odor strong   Urine Culture     Status: None   Collection Time: 01/14/18  4:52 PM  Result Value Ref Range   MICRO NUMBER: [id number]    SPECIMEN QUALITY: ADEQUATE    Sample Source URINE     STATUS: FINAL    ISOLATE 1:      Multiple organisms present, each less than 10,000 CFU/mL. These organisms, commonly found on external and internal genitalia, are considered to be colonizers. No further testing performed.  COMPLETE METABOLIC PANEL WITH GFR     Status: None   Collection Time: 01/23/18 10:13 AM  Result Value Ref Range   Glucose, Bld 82 65 - 99 mg/dL    Comment: .            Fasting reference interval .    BUN 14 7 - 25 mg/dL   Creat 0.71 0.50 - 0.99 mg/dL    Comment: For patients >57 years of age, the reference limit for Creatinine is approximately 13% higher for people identified as African-American. .    GFR, Est Non African American 89 > OR = 60 mL/min/1.75m   GFR, Est African American 103 > OR = 60 mL/min/1.758m  BUN/Creatinine Ratio NOT APPLICABLE 6 - 22 (calc)   Sodium 141 135 - 146 mmol/L   Potassium 4.2 3.5 - 5.3 mmol/L   Chloride 101 98 - 110 mmol/L  CO2 31 20 - 32 mmol/L   Calcium 10.1 8.6 - 10.4 mg/dL   Total Protein 7.1 6.1 - 8.1 g/dL   Albumin 4.2 3.6 - 5.1 g/dL   Globulin 2.9 1.9 - 3.7 g/dL (calc)   AG Ratio 1.4 1.0 - 2.5 (calc)   Total Bilirubin 0.9 0.2 - 1.2 mg/dL   Alkaline phosphatase (APISO) 77 33 - 130 U/L   AST 17 10 - 35 U/L   ALT 12 6 - 29 U/L  CBC with Differential/Platelet     Status: None   Collection Time: 01/23/18 10:13 AM  Result Value Ref Range   WBC 8.9 3.8 - 10.8 Thousand/uL   RBC 4.73 3.80 - 5.10 Million/uL   Hemoglobin 12.8 11.7 - 15.5 g/dL   HCT 38.7 35.0 - 45.0 %   MCV 81.8 80.0 - 100.0 fL   MCH 27.1 27.0 - 33.0 pg   MCHC 33.1 32.0 - 36.0 g/dL   RDW 12.7 11.0 - 15.0 %   Platelets 390 140 - 400 Thousand/uL   MPV 9.8 7.5 - 12.5 fL   Neutro Abs 6,435 1,500 - 7,800 cells/uL   Lymphs Abs 1,344 850 - 3,900 cells/uL   WBC mixed population 854 200 - 950 cells/uL   Eosinophils Absolute 223 15 - 500 cells/uL   Basophils Absolute 45 0 - 200 cells/uL   Neutrophils Relative % 72.3 %   Total Lymphocyte 15.1 %   Monocytes  Relative 9.6 %   Eosinophils Relative 2.5 %   Basophils Relative 0.5 %  Lipid panel     Status: None   Collection Time: 01/23/18 10:13 AM  Result Value Ref Range   Cholesterol 124 <200 mg/dL   HDL 59 >50 mg/dL   Triglycerides 96 <150 mg/dL   LDL Cholesterol (Calc) 47 mg/dL (calc)    Comment: Reference range: <100 . Desirable range <100 mg/dL for primary prevention;   <70 mg/dL for patients with CHD or diabetic patients  with > or = 2 CHD risk factors. Marland Kitchen LDL-C is now calculated using the Martin-Hopkins  calculation, which is a validated novel method providing  better accuracy than the Friedewald equation in the  estimation of LDL-C.  Cresenciano Genre et al. Annamaria Helling. 7893;810(17): 2061-2068  (http://education.[facility].com/faq/FAQ164)    Total CHOL/HDL Ratio 2.1 <5.0 (calc)   Non-HDL Cholesterol (Calc) 65 <130 mg/dL (calc)    Comment: For patients with diabetes plus 1 major ASCVD risk  factor, treating to a non-HDL-C goal of <100 mg/dL  (LDL-C of <70 mg/dL) is considered a therapeutic  option.   Vitamin B12     Status: Abnormal   Collection Time: 01/23/18 10:13 AM  Result Value Ref Range   Vitamin B-12 1,133 (H) 200 - 1,100 pg/mL      PHQ2/9: Depression screen [facility] 2/9 02/06/2018 01/14/2018 10/23/2017 10/12/2017 07/23/2017  Decreased Interest 0 0 0 3 0  Down, Depressed, Hopeless 0 0 '2 3 3  ' PHQ - 2 Score 0 0 '2 6 3  ' Altered sleeping 0 0 0 1 0  Tired, decreased energy '2 3 2 3 2  ' Change in appetite 0 0 0 0 0  Feeling bad or failure about yourself  0 0 1 0 0  Trouble concentrating 0 0 0 0 0  Moving slowly or fidgety/restless 0 0 0 0 0  Suicidal thoughts 0 0 0 0 0  PHQ-9 Score '2 3 5 10 5  ' Difficult doing work/chores Not difficult at all Not difficult at all Not difficult at  all Not difficult at all Somewhat difficult  Some recent data might be hidden     Fall Risk: Fall Risk  02/06/2018 01/23/2018 01/14/2018 10/23/2017 10/12/2017  Falls in the past year? Yes No No Yes Yes   Comment - - - - -  Number falls in past yr: 1 - - 1 1  Injury with Fall? Yes - - Yes No  Comment broke glasses 06/2017 - - - -  Risk Factor Category  High Fall Risk - - - -  Risk for fall due to : History of fall(s) - - - Impaired vision;Medication side effect;Impaired balance/gait  Risk for fall due to: Comment - - - - wears eyeglasses; recent R hip replacement  Follow up Education provided - - - Falls evaluation completed;Education provided;Falls prevention discussed     Functional Status Survey: Is the patient deaf or have difficulty hearing?: No Does the patient have difficulty seeing, even when wearing glasses/contacts?: Yes(reading glasses) Does the patient have difficulty concentrating, remembering, or making decisions?: No Does the patient have difficulty walking or climbing stairs?: No Does the patient have difficulty dressing or bathing?: No  Assessment & Plan  1. Well woman exam   2. Encounter for screening mammogram for breast cancer  - MM 3D SCREEN BREAST BILATERAL; Future  -USPSTF grade A and B recommendations reviewed with patient; age-appropriate recommendations, preventive care, screening tests, etc discussed and encouraged; healthy living encouraged; see AVS for patient education given to patient -Discussed importance of 150 minutes of physical activity weekly, eat two servings of fish weekly, eat one serving of tree nuts ( cashews, pistachios, pecans, almonds.Marland Kitchen) every other day, eat 6 servings of fruit/vegetables daily and drink plenty of water and avoid sweet beverages.

## 2018-02-06 NOTE — Patient Instructions (Signed)
Preventive Care 65 Years and Older, Female Preventive care refers to lifestyle choices and visits with your health care provider that can promote health and wellness. What does preventive care include?  A yearly physical exam. This is also called an annual well check.  Dental exams once or twice a year.  Routine eye exams. Ask your health care provider how often you should have your eyes checked.  Personal lifestyle choices, including: ? Daily care of your teeth and gums. ? Regular physical activity. ? Eating a healthy diet. ? Avoiding tobacco and drug use. ? Limiting alcohol use. ? Practicing safe sex. ? Taking low-dose aspirin every day. ? Taking vitamin and mineral supplements as recommended by your health care provider. What happens during an annual well check? The services and screenings done by your health care provider during your annual well check will depend on your age, overall health, lifestyle risk factors, and family history of disease. Counseling Your health care provider may ask you questions about your:  Alcohol use.  Tobacco use.  Drug use.  Emotional well-being.  Home and relationship well-being.  Sexual activity.  Eating habits.  History of falls.  Memory and ability to understand (cognition).  Work and work environment.  Reproductive health.  Screening You may have the following tests or measurements:  Height, weight, and BMI.  Blood pressure.  Lipid and cholesterol levels. These may be checked every 5 years, or more frequently if you are over 50 years old.  Skin check.  Lung cancer screening. You may have this screening every year starting at age 55 if you have a 30-pack-year history of smoking and currently smoke or have quit within the past 15 years.  Fecal occult blood test (FOBT) of the stool. You may have this test every year starting at age 50.  Flexible sigmoidoscopy or colonoscopy. You may have a sigmoidoscopy every 5 years or  a colonoscopy every 10 years starting at age 50.  Hepatitis C blood test.  Hepatitis B blood test.  Sexually transmitted disease (STD) testing.  Diabetes screening. This is done by checking your blood sugar (glucose) after you have not eaten for a while (fasting). You may have this done every 1-3 years.  Bone density scan. This is done to screen for osteoporosis. You may have this done starting at age 65.  Mammogram. This may be done every 1-2 years. Talk to your health care provider about how often you should have regular mammograms.  Talk with your health care provider about your test results, treatment options, and if necessary, the need for more tests. Vaccines Your health care provider may recommend certain vaccines, such as:  Influenza vaccine. This is recommended every year.  Tetanus, diphtheria, and acellular pertussis (Tdap, Td) vaccine. You may need a Td booster every 10 years.  Varicella vaccine. You may need this if you have not been vaccinated.  Zoster vaccine. You may need this after age 60.  Measles, mumps, and rubella (MMR) vaccine. You may need at least one dose of MMR if you were born in 1957 or later. You may also need a second dose.  Pneumococcal 13-valent conjugate (PCV13) vaccine. One dose is recommended after age 65.  Pneumococcal polysaccharide (PPSV23) vaccine. One dose is recommended after age 65.  Meningococcal vaccine. You may need this if you have certain conditions.  Hepatitis A vaccine. You may need this if you have certain conditions or if you travel or work in places where you may be exposed to hepatitis   A.  Hepatitis B vaccine. You may need this if you have certain conditions or if you travel or work in places where you may be exposed to hepatitis B.  Haemophilus influenzae type b (Hib) vaccine. You may need this if you have certain conditions.  Talk to your health care provider about which screenings and vaccines you need and how often you  need them. This information is not intended to replace advice given to you by your health care provider. Make sure you discuss any questions you have with your health care provider. Document Released: 05/14/2015 Document Revised: 01/05/2016 Document Reviewed: 02/16/2015 Elsevier Interactive Patient Education  2018 [employer]  

## 2018-02-19 DIAGNOSIS — M169 Osteoarthritis of hip, unspecified: Secondary | ICD-10-CM | POA: Insufficient documentation

## 2018-03-15 HISTORY — PX: OTHER SURGICAL HISTORY: SHX169

## 2018-03-18 ENCOUNTER — Other Ambulatory Visit: Payer: Self-pay | Admitting: Family Medicine

## 2018-03-18 NOTE — Telephone Encounter (Signed)
Refill request for general medication: Meloxicam 15 mg  Last office visit: 02/06/2018  Last physical exam: 02/06/2018  Follow-ups on file. 05/27/2018

## 2018-04-14 ENCOUNTER — Other Ambulatory Visit: Payer: Self-pay | Admitting: Family Medicine

## 2018-04-14 DIAGNOSIS — J3089 Other allergic rhinitis: Principal | ICD-10-CM

## 2018-04-14 DIAGNOSIS — J302 Other seasonal allergic rhinitis: Secondary | ICD-10-CM

## 2018-04-25 ENCOUNTER — Ambulatory Visit
Admission: RE | Admit: 2018-04-25 | Discharge: 2018-04-25 | Disposition: A | Discharge status: Home / Self Care | Payer: Medicare Other | Source: Ambulatory Visit | Attending: Family Medicine | Admitting: Family Medicine

## 2018-04-25 DIAGNOSIS — Z1231 Encounter for screening mammogram for malignant neoplasm of breast: Secondary | ICD-10-CM | POA: Diagnosis present

## 2018-05-27 ENCOUNTER — Encounter: Payer: Self-pay | Admitting: Family Medicine

## 2018-05-27 ENCOUNTER — Ambulatory Visit: Payer: Medicare Other | Admitting: Family Medicine

## 2018-05-27 VITALS — BP 138/74 | HR 78 | Temp 98.4°F | Resp 16 | Ht 62.0 in | Wt 193.8 lb

## 2018-05-27 DIAGNOSIS — E8881 Metabolic syndrome: Secondary | ICD-10-CM | POA: Diagnosis not present

## 2018-05-27 DIAGNOSIS — E785 Hyperlipidemia, unspecified: Secondary | ICD-10-CM

## 2018-05-27 DIAGNOSIS — J302 Other seasonal allergic rhinitis: Secondary | ICD-10-CM

## 2018-05-27 DIAGNOSIS — I10 Essential (primary) hypertension: Secondary | ICD-10-CM

## 2018-05-27 DIAGNOSIS — E538 Deficiency of other specified B group vitamins: Secondary | ICD-10-CM

## 2018-05-27 DIAGNOSIS — F330 Major depressive disorder, recurrent, mild: Secondary | ICD-10-CM

## 2018-05-27 DIAGNOSIS — J3089 Other allergic rhinitis: Secondary | ICD-10-CM

## 2018-05-27 DIAGNOSIS — F411 Generalized anxiety disorder: Secondary | ICD-10-CM

## 2018-05-27 NOTE — Progress Notes (Signed)
Name: Monique Baker   MRN: [id number]    DOB: March 20, 1951   Date:05/27/2018       Progress Note  Subjective  Chief Complaint  Chief Complaint  Patient presents with  . Medication Refill  . Hypertension    Denies any symptoms  . Depression  . Gastroesophageal Reflux  . Osteoarthritis    HPI  Pre diabetes/metabolic syndrome: she is taking Metformin, denies side effects, hgbA1C was 5.9%, no polyphagia, polydipsia or polyuria. She is still going to weight watchers and lost 3 lbs since last visit   Right hip pain/OA: she had right hip replacement back in Nov 2019 by Dr. Odis Luster, currently using a cane and having PT . She has a walker that she only uses when walking for a long time. Still pain, 2/10, she is was off pain medication, but last week she knelt down to look under her bed and has aggravated the pain, but doing better now.   Extreme obesity:she had lost 19 lbs since she joined weight watchers 05/2016 and is doing well. Not drinking sweet beverages on a regular basis, still counting points, eating more fruit and vegetables, taking some protein shakes.  HTN: taking bp medication, denies side effects, no chest pain, no palpitation. BP is at goal today   Depression Major: she is married to a Education officer, environmental, he also works as a Airline pilot at [employer]. She is only baby-sitting grandchildren prn and that is not as stressful now. We started her on Cymbalta 10/2015 and is feeling better no longer has anhedonia, motivation is back, no longer having crying spells, and not feeling so hungry, however has hot flashes and hair loss and is concerned about possible side effects, we switched to Celexaback in Fall 2018and hot flashes resolved and hair started to grown again.We also added Wellbutrin  March 2019 , she states she is stable, had surgery but seems like stress level went down when nobody expected her to help over the holidays   Patient Active Problem List   Diagnosis Date Noted  .  Osteopenia of hip 09/04/2016  . History of bunionectomy of left great toe 07/15/2015  . H/O hammer toe correction 03/15/2015  . Arthritis of lumbar spine 01/19/2015  . Benign essential HTN 11/05/2014  . Carpal tunnel syndrome 11/05/2014  . Osteoarthritis 11/05/2014  . Dermatitis, eczematoid 11/05/2014  . Depression, major, recurrent, mild (HCC) 11/05/2014  . Dyslipidemia 11/05/2014  . Gastro-esophageal reflux disease without esophagitis 11/05/2014  . Morbid obesity due to excess calories (HCC) 11/05/2014  . Dysmetabolic syndrome 11/05/2014  . Menopausal and perimenopausal disorder 11/05/2014  . Perennial allergic rhinitis with seasonal variation 11/05/2014  . Seborrhea capitis 11/05/2014  . Acquired trigger finger 11/05/2014  . Urinary incontinence in female 11/05/2014  . Vitamin D deficiency 11/05/2014  . Blood glucose elevated 08/31/2009    Past Surgical History:  Procedure Laterality Date  . ABDOMINAL HYSTERECTOMY    . CATARACT EXTRACTION, BILATERAL    . CESAREAN SECTION    . COLONOSCOPY WITH PROPOFOL N/A 03/15/2016   Procedure: COLONOSCOPY WITH PROPOFOL;  Surgeon: Kieth Brightly, MD;  Location: [facility];  Service: Endoscopy;  Laterality: N/A;  . HALLUX VALGUS LAPIDUS Left 06/16/2015   Procedure: HALLUX VALGUS LAPIDUS;  Surgeon: Gwyneth Revels, DPM;  Location: [facility] [place] [facility];  Service: Podiatry;  Laterality: Left;  WITH POPLITEAL  . HAMMER TOE SURGERY Left 06/16/2015   Procedure: HAMMER TOE CORRECTION SECOND TOE LEFT;  Surgeon: Gwyneth Revels, DPM;  Location: [facility];  Service: Podiatry;  Laterality: Left;  prediabetic - on oral meds  . hip replacemanet Right 03/15/2018   Alcus Dad  . MYOMECTOMY    . OOPHORECTOMY    . trigger finger Right 12/15/2014  . Vulva cyst  11/2006    Family History  Problem Relation Age of Onset  . Dementia Mother   . Diabetes Father   . Stroke Father   . Cancer Daughter        Fibroid Tumors  .  Diabetes Brother   . Breast cancer Neg Hx     Social History   Socioeconomic History  . Marital status: Married    Spouse name: Adela Glimpse  . Number of children: 1  . Years of education: Not on file  . Highest education level: Bachelor's degree (e.g., BA, AB, BS)  Occupational History  . Occupation: retired Runner, broadcasting/film/video    Comment: Hospital doctor  . Financial resource strain: Not hard at all  . Food insecurity:    Worry: Never true    Inability: Never true  . Transportation needs:    Medical: No    Non-medical: No  Tobacco Use  . Smoking status: Never Smoker  . Smokeless tobacco: Never Used  . Tobacco comment: smoking cessation materials not required  Substance and Sexual Activity  . Alcohol use: No    Alcohol/week: 0.0 standard drinks  . Drug use: No  . Sexual activity: Not Currently    Partners: Male  Lifestyle  . Physical activity:    Days per week: 0 days    Minutes per session: 0 min  . Stress: Rather much  Relationships  . Social connections:    Talks on phone: Patient refused    Gets together: Patient refused    Attends religious service: Patient refused    Active member of club or organization: Patient refused    Attends meetings of clubs or organizations: Patient refused    Relationship status: Married  . Intimate partner violence:    Fear of current or ex partner: No    Emotionally abused: No    Physically abused: No    Forced sexual activity: No  Other Topics Concern  . Not on file  Social History Narrative   Married to a Education officer, environmental and he is a professor at [employer] Armed forces training and education officer )      Current Outpatient Medications:  .  acetaminophen (TYLENOL) 500 MG tablet, Take by mouth. Reported on 11/15/2015, Disp: , Rfl:  .  aspirin EC 81 MG tablet, Take 81 mg by mouth daily., Disp: , Rfl:  .  atorvastatin (LIPITOR) 40 MG tablet, Take 1 tablet (40 mg total) by mouth every evening. for cholesterol, Disp: 90 tablet, Rfl: 1 .  buPROPion (WELLBUTRIN XL) 150 MG  24 hr tablet, Take 1 tablet (150 mg total) by mouth daily before breakfast., Disp: 90 tablet, Rfl: 1 .  Cholecalciferol (VITAMIN D PO), Take 1 capsule by mouth daily. , Disp: , Rfl:  .  citalopram (CELEXA) 20 MG tablet, Take 1 tablet (20 mg total) by mouth daily., Disp: 90 tablet, Rfl: 1 .  Cyanocobalamin (B-12) 1000 MCG SUBL, Place 1 each under the tongue once a week. , Disp: , Rfl:  .  docusate sodium (COLACE) 100 MG capsule, Take by mouth daily., Disp: , Rfl:  .  fluticasone (FLONASE) 50 MCG/ACT nasal spray, Place 2 sprays into both nostrils daily., Disp: 48 g, Rfl: 0 .  glucose blood (ONETOUCH VERIO) test strip, Use as instructed,  Disp: 100 each, Rfl: 12 .  loratadine (CLARITIN) 10 MG tablet, TAKE 1 TABLET BY MOUTH EVERY DAY, Disp: 90 tablet, Rfl: 1 .  meloxicam (MOBIC) 15 MG tablet, TAKE 1 TABLET BY MOUTH EVERY DAY, Disp: 30 tablet, Rfl: 0 .  metFORMIN (GLUCOPHAGE) 500 MG tablet, Take 1 tablet (500 mg total) by mouth 2 (two) times daily., Disp: 180 tablet, Rfl: 1 .  nadolol (CORGARD) 20 MG tablet, Take 1 tablet (20 mg total) by mouth every evening., Disp: 90 tablet, Rfl: 1 .  Olmesartan-amLODIPine-HCTZ 20-5-12.5 MG TABS, Take 1 tablet by mouth daily., Disp: 90 tablet, Rfl: 1 .  oxyCODONE (OXY IR/ROXICODONE) 5 MG immediate release tablet, TAKE 1 TABLET(S) EVERY 4 HOURS BY ORAL ROUTE AS NEEDED FOR 5 DAYS., Disp: , Rfl:  .  triamcinolone cream (KENALOG) 0.1 %, Reported on 07/15/2015, Disp: , Rfl:   Allergies  Allergen Reactions  . Duloxetine     sweat    I personally reviewed active problem list, medication list, allergies, family history, social history with the patient/caregiver today.   ROS  Constitutional: Negative for fever or weight change.  Respiratory: positive for mild cough ( since recent bronchitis )  and shortness of breath.   Cardiovascular: Negative for chest pain or palpitations.  Gastrointestinal: Negative for abdominal pain, no bowel changes.  Musculoskeletal: Positive   for gait problem but no  joint swelling.  Skin: Negative for rash.  Neurological: Negative for dizziness or headache.  No other specific complaints in a complete review of systems (except as listed in HPI above).  Objective  Vitals:   05/27/18 0936  BP: 138/74  Pulse: 78  Resp: 16  Temp: 98.4 F (36.9 C)  TempSrc: Oral  SpO2: 96%  Weight: 193 lb 12.8 oz (87.9 kg)  Height: 5\' 2"  (1.575 m)    Body mass index is 35.45 kg/m.  Physical Exam  Constitutional: Patient appears well-developed and well-nourished. Obese No distress.  HEENT: head atraumatic, normocephalic, pupils equal and reactive to light, neck supple, throat within normal limits Cardiovascular: Normal rate, regular rhythm and normal heart sounds.  No murmur heard. No BLE edema. Pulmonary/Chest: Effort normal and breath sounds normal. No respiratory distress. Muscular Skeletal: she has decrease rom of right hip, still getting PT  Abdominal: Soft.  There is no tenderness. Psychiatric: Patient has a normal mood and affect. behavior is normal. Judgment and thought content normal.  PHQ2/9: Depression screen [facility] 05/27/2018 02/06/2018 01/14/2018 10/23/2017 10/12/2017  Decreased Interest 2 0 0 0 3  Down, Depressed, Hopeless 1 0 0 2 3  PHQ - 2 Score 3 0 0 2 6  Altered sleeping 0 0 0 0 1  Tired, decreased energy 1 2 3 2 3   Change in appetite 0 0 0 0 0  Feeling bad or failure about yourself  0 0 0 1 0  Trouble concentrating 0 0 0 0 0  Moving slowly or fidgety/restless 0 0 0 0 0  Suicidal thoughts 0 0 0 0 0  PHQ-9 Score 4 2 3 5 10   Difficult doing work/chores Not difficult at all Not difficult at all Not difficult at all Not difficult at all Not difficult at all  Some recent data might be hidden    Fall Risk: Fall Risk  05/27/2018 02/06/2018 01/23/2018 01/14/2018 10/23/2017  Falls in the past year? 0 Yes No No Yes  Comment - - - - -  Number falls in past yr: - 1 - - 1  Injury with Fall? -  Yes - - Yes  Comment - broke  glasses 06/2017 - - -  Risk Factor Category  - High Fall Risk - - -  Risk for fall due to : - History of fall(s) - - -  Risk for fall due to: Comment - - - - -  Follow up - Education provided - - -    Functional Status Survey: Is the patient deaf or have difficulty hearing?: No Does the patient have difficulty seeing, even when wearing glasses/contacts?: Yes Does the patient have difficulty concentrating, remembering, or making decisions?: No Does the patient have difficulty walking or climbing stairs?: Yes Does the patient have difficulty dressing or bathing?: No Does the patient have difficulty doing errands alone such as visiting a doctor's office or shopping?: No    Assessment & Plan  1. Benign essential HTN  Under control, continue medications  2. Morbid obesity due to excess calories (HCC)  Continue Weight watchers   3. Dysmetabolic syndrome  Continue life style modification, last A1C was 5.8%  4. B12 deficiency  Still taking otc supplementations twice a week   5. Depression, major, recurrent, mild (HCC)  Stable, not in remission , but she does not want to change medication , discussed therapy. She will think about it.   6. Dyslipidemia  On statin therapy   7. Perennial allergic rhinitis with seasonal variation  Stable

## 2018-05-27 NOTE — Patient Instructions (Signed)
[facility]  [phone]

## 2018-07-02 ENCOUNTER — Ambulatory Visit (INDEPENDENT_AMBULATORY_CARE_PROVIDER_SITE_OTHER): Payer: Medicare Other | Admitting: Nurse Practitioner

## 2018-07-02 ENCOUNTER — Other Ambulatory Visit: Payer: Self-pay

## 2018-07-02 ENCOUNTER — Encounter: Payer: Self-pay | Admitting: Nurse Practitioner

## 2018-07-02 VITALS — BP 130/78 | HR 83 | Temp 99.0°F | Resp 18 | Ht 62.0 in | Wt 195.1 lb

## 2018-07-02 DIAGNOSIS — J029 Acute pharyngitis, unspecified: Secondary | ICD-10-CM

## 2018-07-02 DIAGNOSIS — J069 Acute upper respiratory infection, unspecified: Secondary | ICD-10-CM | POA: Diagnosis not present

## 2018-07-02 DIAGNOSIS — J040 Acute laryngitis: Secondary | ICD-10-CM | POA: Diagnosis not present

## 2018-07-02 MED ORDER — BENZONATATE 200 MG PO CAPS: 200.0000 mg | ORAL_CAPSULE | Freq: Three times a day (TID) | ORAL | 1 refills | Status: DC | PRN

## 2018-07-02 MED ORDER — MAGIC MOUTHWASH W/LIDOCAINE: 5.0000 mL | Freq: Three times a day (TID) | ORAL | 0 refills | Status: DC | PRN

## 2018-07-02 NOTE — Progress Notes (Signed)
Name: Monique Baker   MRN: [id number]    DOB: 08/08/1950   Date:07/02/2018       Progress Note  Subjective  Chief Complaint  Chief Complaint  Patient presents with  . Cough    Onset-Thursday, coughing, Nasal Drainage, Sore Throat  . Laryngitis    Worst at night and the drainage will make her throat up.  . Nasal Congestion    Has been taking Robitussin with Honey, Mucinex DM    HPI  Patient presents to clinic with illness ongoing for 5 days. First symptom was sore throat then rhinorrhea. Onset was progressive.  Endorses laryngitis, post nasal drip, dry cough.  Has been taking robitussin with honey and mucinex DM.   Denies Ear pain, discharge, muffled sounds, shortness of breath, fever, myalgias.      Patient Active Problem List   Diagnosis Date Noted  . Osteopenia of hip 09/04/2016  . History of bunionectomy of left great toe 07/15/2015  . H/O hammer toe correction 03/15/2015  . Arthritis of lumbar spine 01/19/2015  . Benign essential HTN 11/05/2014  . Carpal tunnel syndrome 11/05/2014  . Osteoarthritis 11/05/2014  . Dermatitis, eczematoid 11/05/2014  . Depression, major, recurrent, mild (HCC) 11/05/2014  . Dyslipidemia 11/05/2014  . Gastro-esophageal reflux disease without esophagitis 11/05/2014  . Morbid obesity due to excess calories (HCC) 11/05/2014  . Dysmetabolic syndrome 11/05/2014  . Menopausal and perimenopausal disorder 11/05/2014  . Perennial allergic rhinitis with seasonal variation 11/05/2014  . Seborrhea capitis 11/05/2014  . Acquired trigger finger 11/05/2014  . Urinary incontinence in female 11/05/2014  . Vitamin D deficiency 11/05/2014  . Blood glucose elevated 08/31/2009    Past Medical History:  Diagnosis Date  . Allergy   . Depression   . GERD (gastroesophageal reflux disease)   . Hyperlipidemia   . Hypertension   . Internal hemorrhoids   . Menopausal disorder   . Osteoarthritis    hands  . Prediabetes   . Wears dentures    partial  upper    Past Surgical History:  Procedure Laterality Date  . ABDOMINAL HYSTERECTOMY    . CATARACT EXTRACTION, BILATERAL    . CESAREAN SECTION    . COLONOSCOPY WITH PROPOFOL N/A 03/15/2016   Procedure: COLONOSCOPY WITH PROPOFOL;  Surgeon: Kieth Brightly, MD;  Location: [facility];  Service: Endoscopy;  Laterality: N/A;  . HALLUX VALGUS LAPIDUS Left 06/16/2015   Procedure: HALLUX VALGUS LAPIDUS;  Surgeon: Gwyneth Revels, DPM;  Location: [facility];  Service: Podiatry;  Laterality: Left;  WITH POPLITEAL  . HAMMER TOE SURGERY Left 06/16/2015   Procedure: HAMMER TOE CORRECTION SECOND TOE LEFT;  Surgeon: Gwyneth Revels, DPM;  Location: [facility];  Service: Podiatry;  Laterality: Left;  prediabetic - on oral meds  . hip replacemanet Right 03/15/2018   Alcus Dad  . MYOMECTOMY    . OOPHORECTOMY    . trigger finger Right 12/15/2014  . Vulva cyst  11/2006    Social History   Tobacco Use  . Smoking status: Never Smoker  . Smokeless tobacco: Never Used  . Tobacco comment: smoking cessation materials not required  Substance Use Topics  . Alcohol use: No    Alcohol/week: 0.0 standard drinks     Current Outpatient Medications:  .  acetaminophen (TYLENOL) 500 MG tablet, Take by mouth. Reported on 11/15/2015, Disp: , Rfl:  .  aspirin EC 81 MG tablet, Take 81 mg by mouth daily., Disp: , Rfl:  .  atorvastatin (LIPITOR) 40 MG tablet,  Take 1 tablet (40 mg total) by mouth every evening. for cholesterol, Disp: 90 tablet, Rfl: 1 .  buPROPion (WELLBUTRIN XL) 150 MG 24 hr tablet, Take 1 tablet (150 mg total) by mouth daily before breakfast., Disp: 90 tablet, Rfl: 1 .  Cholecalciferol (VITAMIN D PO), Take 1 capsule by mouth daily. , Disp: , Rfl:  .  citalopram (CELEXA) 20 MG tablet, Take 1 tablet (20 mg total) by mouth daily., Disp: 90 tablet, Rfl: 1 .  Cyanocobalamin (B-12) 1000 MCG SUBL, Place 1 each under the tongue once a week. , Disp: , Rfl:  .  docusate  sodium (COLACE) 100 MG capsule, Take by mouth daily., Disp: , Rfl:  .  fluticasone (FLONASE) 50 MCG/ACT nasal spray, Place 2 sprays into both nostrils daily., Disp: 48 g, Rfl: 0 .  glucose blood (ONETOUCH VERIO) test strip, Use as instructed, Disp: 100 each, Rfl: 12 .  loratadine (CLARITIN) 10 MG tablet, TAKE 1 TABLET BY MOUTH EVERY DAY, Disp: 90 tablet, Rfl: 1 .  metFORMIN (GLUCOPHAGE) 500 MG tablet, Take 1 tablet (500 mg total) by mouth 2 (two) times daily., Disp: 180 tablet, Rfl: 1 .  nadolol (CORGARD) 20 MG tablet, Take 1 tablet (20 mg total) by mouth every evening., Disp: 90 tablet, Rfl: 1 .  Olmesartan-amLODIPine-HCTZ 20-5-12.5 MG TABS, Take 1 tablet by mouth daily., Disp: 90 tablet, Rfl: 1 .  triamcinolone cream (KENALOG) 0.1 %, Reported on 07/15/2015, Disp: , Rfl:   Allergies  Allergen Reactions  . Duloxetine     sweat    ROS   No other specific complaints in a complete review of systems (except as listed in HPI above).  Objective  Vitals:   07/02/18 1401  BP: 130/78  Pulse: 83  Resp: 18  Temp: 99 F (37.2 C)  TempSrc: Oral  SpO2: 97%  Weight: 195 lb 1.6 oz (88.5 kg)  Height: 5\' 2"  (1.575 m)    Body mass index is 35.68 kg/m.  Nursing Note and Vital Signs reviewed.  Physical Exam HENT:     Head: Normocephalic and atraumatic.     Right Ear: Hearing, tympanic membrane, ear canal and external ear normal.     Left Ear: Hearing, tympanic membrane, ear canal and external ear normal.     Nose: Congestion and rhinorrhea present.     Right Sinus: No maxillary sinus tenderness or frontal sinus tenderness.     Left Sinus: No maxillary sinus tenderness or frontal sinus tenderness.     Mouth/Throat:     Mouth: Mucous membranes are moist.     Pharynx: Uvula midline. No oropharyngeal exudate or posterior oropharyngeal erythema.  Eyes:     General:        Right eye: No discharge.        Left eye: No discharge.     Conjunctiva/sclera: Conjunctivae normal.  Neck:      Musculoskeletal: Normal range of motion.  Cardiovascular:     Rate and Rhythm: Normal rate.  Pulmonary:     Effort: Pulmonary effort is normal.     Breath sounds: Normal breath sounds.  Lymphadenopathy:     Cervical: No cervical adenopathy.  Skin:    General: Skin is warm and dry.     Findings: No rash.  Neurological:     Mental Status: She is alert.  Psychiatric:        Judgment: Judgment normal.      No results found for this or any previous visit (from the past 48  hour(s)).  Assessment & Plan  1. Viral upper respiratory tract infection See AVS - benzonatate (TESSALON) 200 MG capsule; Take 1 capsule (200 mg total) by mouth 3 (three) times daily as needed for cough.  Dispense: 30 capsule; Refill: 1  2. Laryngitis - magic mouthwash w/lidocaine SOLN; Take 5 mLs by mouth 3 (three) times daily as needed for mouth pain.  Dispense: 50 mL; Refill: 0  3. Sore throat - magic mouthwash w/lidocaine SOLN; Take 5 mLs by mouth 3 (three) times daily as needed for mouth pain.  Dispense: 50 mL; Refill: 0

## 2018-07-02 NOTE — Patient Instructions (Addendum)
You likely have a viral upper respiratory infection (URI). Antibiotics will not reduce the number of days you are ill or prevent you from getting bacterial rhinosinusitis. A URI can take up to 14 days to resolve, but typically last between 7-11 days. Your body is so smart and strong that it will be fighting this illness off for you but it is important that you drink plenty of fluids, rest. Cover your nose/mouth when you cough or sneeze and wash your hands well and often. Here are some helpful things you can use or pick up over the counter from the pharmacy to help with your symptoms:   For Fever/Pain: Acetaminophen every 6 hours as needed (maximum of 3000mg  a day). If you are still uncomfortable you can add ibuprofen OR naproxen  For coughing: try dextromethorphan for a cough suppressant, and/or a cool mist humidifier, lozenges  For sore throat: saline gargles, honey herbal tea, lozenges, throat spray  To dry out your nose: try an antihistamine like loratadine (non-sedating) or diphenhydramine (sedating) or others To relieve a stuffy nose: try flonase,  neti pot To make blowing your nose easier and relieve chest congestion: guaifenesin 400mg  every 4-6 hours of guaifenesin ER [phone] mg every 12 hours. Do not take more than 2,400mg  a day.   Throat- cepacol can put it in some hot water and let it melt and use it as a tea.  - If your symptoms continue to worsen at any time or do not improve in the next 3 days or resolve in the next week please let us know as soon as possible.    Laryngitis Laryngitis is inflammation of the vocal cords that causes symptoms such as hoarseness or loss of voice. The vocal cords are two bands of muscles in your throat. When you speak, these cords come together and vibrate. The vibrations come out through your mouth as sound. When your vocal cords are inflamed, your voice sounds different. Laryngitis can be temporary (acute) or long-term (chronic). Most cases of acute  laryngitis improve with time. Chronic laryngitis is laryngitis that lasts for more than 3 weeks. What are the causes? Acute laryngitis may be caused by:  A viral infection.  Lots of talking, yelling, or singing. This is also called vocal strain.  A bacterial infection. Chronic laryngitis may be caused by:  Vocal strain.  Injury to your vocal cords.  Acid reflux (gastroesophageal reflux disease, or GERD).  Allergies.  A sinus infection.  Smoking.  Alcohol abuse.  Breathing in chemicals or dust.  Growths on the vocal cords. What increases the risk? The following factors may make you more likely to develop this condition:  Smoking.  Alcohol abuse.  Having allergies.  Chronic irritants in the workplace, such as toxic fumes. What are the signs or symptoms? Symptoms of this condition may include:  Low, hoarse voice.  Loss of voice.  Dry cough.  Sore or dry throat.  Stuffy nose. How is this diagnosed? This condition may be diagnosed based on:  Your symptoms and a physical exam.  Throat culture.  Blood test.  A procedure in which your health care provider looks at your vocal cords with a mirror or viewing tube (laryngoscopy). How is this treated? Treatment for laryngitis depends on what is causing it.  Usually, treatment involves resting your voice and using medicines to soothe your throat.  If your laryngitis is caused by a bacterial infection, you may need to take antibiotic medicine.  If your laryngitis is caused by  a growth, you may need to have a procedure to remove it. Follow these instructions at home: Medicines  Take over-the-counter and prescription medicines only as told by your health care provider.  If you were prescribed an antibiotic medicine, take it as told by your health care provider. Do not stop taking the antibiotic even if you start to feel better. General instructions  Talk as little as possible. Also avoid whispering, which  can cause vocal strain.  Write instead of talking. Do this until your voice is back to normal.  Drink enough fluid to keep your urine pale yellow.  Breathe in moist air. Use a humidifier if you live in a dry climate.  Do not use any products that contain nicotine or tobacco, such as cigarettes and e-cigarettes. If you need help quitting, ask your health care provider. Contact a health care provider if:  You have a fever.  You have increasing pain.  Your symptoms do not get better in 2 weeks. Get help right away if:  You cough up blood.  You have difficulty swallowing.  You have trouble breathing. Summary  Laryngitis is inflammation of the vocal cords that causes symptoms such as hoarseness or loss of voice.  Laryngitis can be temporary (acute) or long-term (chronic).  Treatment for laryngitis depends on the cause. It often involves resting your voice and using medicine to soothe your throat. This information is not intended to replace advice given to you by your health care provider. Make sure you discuss any questions you have with your health care provider. Document Released: 04/17/2005 Document Revised: 04/04/2017 Document Reviewed: 04/04/2017 Elsevier Interactive Patient Education  2019 [employer].

## 2018-08-13 ENCOUNTER — Other Ambulatory Visit: Payer: Self-pay | Admitting: Family Medicine

## 2018-08-13 DIAGNOSIS — I10 Essential (primary) hypertension: Secondary | ICD-10-CM

## 2018-08-22 ENCOUNTER — Other Ambulatory Visit: Payer: Self-pay | Admitting: Family Medicine

## 2018-08-22 DIAGNOSIS — E8881 Metabolic syndrome: Secondary | ICD-10-CM

## 2018-09-30 ENCOUNTER — Ambulatory Visit: Payer: Medicare Other | Admitting: Family Medicine

## 2018-10-05 ENCOUNTER — Other Ambulatory Visit: Payer: Self-pay | Admitting: *Deleted

## 2018-10-05 DIAGNOSIS — Z20822 Contact with and (suspected) exposure to covid-19: Secondary | ICD-10-CM

## 2018-10-07 LAB — NOVEL CORONAVIRUS, NAA: SARS-CoV-2, NAA: NOT DETECTED

## 2018-10-08 ENCOUNTER — Other Ambulatory Visit: Payer: Self-pay | Admitting: Family Medicine

## 2018-10-08 DIAGNOSIS — F411 Generalized anxiety disorder: Secondary | ICD-10-CM

## 2018-10-08 DIAGNOSIS — F330 Major depressive disorder, recurrent, mild: Secondary | ICD-10-CM

## 2018-10-13 ENCOUNTER — Other Ambulatory Visit: Payer: Self-pay | Admitting: Family Medicine

## 2018-10-13 DIAGNOSIS — E785 Hyperlipidemia, unspecified: Secondary | ICD-10-CM

## 2018-10-13 DIAGNOSIS — I10 Essential (primary) hypertension: Secondary | ICD-10-CM

## 2018-10-13 DIAGNOSIS — F330 Major depressive disorder, recurrent, mild: Secondary | ICD-10-CM

## 2018-10-22 ENCOUNTER — Ambulatory Visit (INDEPENDENT_AMBULATORY_CARE_PROVIDER_SITE_OTHER): Payer: Medicare Other

## 2018-10-22 ENCOUNTER — Other Ambulatory Visit: Payer: Self-pay

## 2018-10-22 VITALS — BP 122/80 | HR 65 | Temp 98.2°F | Resp 16 | Ht 62.0 in | Wt 192.1 lb

## 2018-10-22 DIAGNOSIS — Z0000 Encounter for general adult medical examination without abnormal findings: Secondary | ICD-10-CM | POA: Diagnosis not present

## 2018-10-22 NOTE — Patient Instructions (Signed)
Monique Baker , Thank you for taking time to come for your Medicare Wellness Visit. I appreciate your ongoing commitment to your health goals. Please review the following plan we discussed and let me know if I can assist you in the future.   Screening recommendations/referrals: Colonoscopy: done 03/15/16. Repeat in 2027. Mammogram: done 04/25/18. Bone Density: done 09/04/16 Recommended yearly ophthalmology/optometry visit for glaucoma screening and checkup Recommended yearly dental visit for hygiene and checkup  Vaccinations: Influenza vaccine: done 01/14/18 Pneumococcal vaccine: done 07/23/17 Tdap vaccine: done 07/23/17 Shingles vaccine: Shingrix discussed. Please contact your pharmacy for coverage information.   Advanced directives: Advance directive discussed with you today. I have provided a copy for you to complete at home and have notarized. Once this is complete please bring a copy in to our office so we can scan it into your chart.  Conditions/risks identified: Continue healthy eating and increase physical activity.  Next appointment: Please follow up in one year for your Medicare Annual Wellness visit.     Preventive Care 68 Years and Older, Female Preventive care refers to lifestyle choices and visits with your health care provider that can promote health and wellness. What does preventive care include?  A yearly physical exam. This is also called an annual well check.  Dental exams once or twice a year.  Routine eye exams. Ask your health care provider how often you should have your eyes checked.  Personal lifestyle choices, including:  Daily care of your teeth and gums.  Regular physical activity.  Eating a healthy diet.  Avoiding tobacco and drug use.  Limiting alcohol use.  Practicing safe sex.  Taking low-dose aspirin every day.  Taking vitamin and mineral supplements as recommended by your health care provider. What happens during an annual well check? The  services and screenings done by your health care provider during your annual well check will depend on your age, overall health, lifestyle risk factors, and family history of disease. Counseling  Your health care provider may ask you questions about your:  Alcohol use.  Tobacco use.  Drug use.  Emotional well-being.  Home and relationship well-being.  Sexual activity.  Eating habits.  History of falls.  Memory and ability to understand (cognition).  Work and work Statistician.  Reproductive health. Screening  You may have the following tests or measurements:  Height, weight, and BMI.  Blood pressure.  Lipid and cholesterol levels. These may be checked every 5 years, or more frequently if you are over 68 years old.  Skin check.  Lung cancer screening. You may have this screening every year starting at age 68 if you have a 30-pack-year history of smoking and currently smoke or have quit within the past 15 years.  Fecal occult blood test (FOBT) of the stool. You may have this test every year starting at age 688.  Flexible sigmoidoscopy or colonoscopy. You may have a sigmoidoscopy every 5 years or a colonoscopy every 10 years starting at age 68.  Hepatitis C blood test.  Hepatitis B blood test.  Sexually transmitted disease (STD) testing.  Diabetes screening. This is done by checking your blood sugar (glucose) after you have not eaten for a while (fasting). You may have this done every 1-3 years.  Bone density scan. This is done to screen for osteoporosis. You may have this done starting at age 68.  Mammogram. This may be done every 1-2 years. Talk to your health care provider about how often you should have regular mammograms. Talk  with your health care provider about your test results, treatment options, and if necessary, the need for more tests. Vaccines  Your health care provider may recommend certain vaccines, such as: (after age 68).  Influenza vaccine. This is recommended  every year.  Tetanus, diphtheria, and acellular pertussis (Tdap, Td) vaccine. You may need a Td booster every 10 years.  Zoster vaccine. You may need this after age 68.  Pneumococcal 13-valent conjugate (PCV13) vaccine. One dose is recommended after age 68.  Pneumococcal polysaccharide (PPSV23) vaccine. One dose is recommended after age 68. Talk to your health care provider about which screenings and vaccines you need and how often you need them. This information is not intended to replace advice given to you by your health care provider. Make sure you discuss any questions you have with your health care provider. Document Released: 05/14/2015 Document Revised: 01/05/2016 Document Reviewed: 02/16/2015 Elsevier Interactive Patient Education  2017 [place] Prevention in the Home Falls can cause injuries. They can happen to people of all ages. There are many things you can do to make your home safe and to help prevent falls. What can I do on the outside of my home?  Regularly fix the edges of walkways and driveways and fix any cracks.  Remove anything that might make you trip as you walk through a door, such as a raised step or threshold.  Trim any bushes or trees on the path to your home.  Use bright outdoor lighting.  Clear any walking paths of anything that might make someone trip, such as rocks or tools.  Regularly check to see if handrails are loose or broken. Make sure that both sides of any steps have handrails.  Any raised decks and porches should have guardrails on the edges.  Have any leaves, snow, or ice cleared regularly.  Use sand or salt on walking paths during winter.  Clean up any spills in your garage right away. This includes oil or grease spills. What can I do in the bathroom?  Use night lights.  Install grab bars by the toilet and in the tub and shower. Do not use towel bars as grab bars.  Use non-skid mats or decals in the tub or shower.  If  you need to sit down in the shower, use a plastic, non-slip stool.  Keep the floor dry. Clean up any water that spills on the floor as soon as it happens.  Remove soap buildup in the tub or shower regularly.  Attach bath mats securely with double-sided non-slip rug tape.  Do not have throw rugs and other things on the floor that can make you trip. What can I do in the bedroom?  Use night lights.  Make sure that you have a light by your bed that is easy to reach.  Do not use any sheets or blankets that are too big for your bed. They should not hang down onto the floor.  Have a firm chair that has side arms. You can use this for support while you get dressed.  Do not have throw rugs and other things on the floor that can make you trip. What can I do in the kitchen?  Clean up any spills right away.  Avoid walking on wet floors.  Keep items that you use a lot in easy-to-reach places.  If you need to reach something above you, use a strong step stool that has a grab bar.  Keep electrical cords out of the way.  Do  not use floor polish or wax that makes floors slippery. If you must use wax, use non-skid floor wax.  Do not have throw rugs and other things on the floor that can make you trip. What can I do with my stairs?  Do not leave any items on the stairs.  Make sure that there are handrails on both sides of the stairs and use them. Fix handrails that are broken or loose. Make sure that handrails are as long as the stairways.  Check any carpeting to make sure that it is firmly attached to the stairs. Fix any carpet that is loose or worn.  Avoid having throw rugs at the top or bottom of the stairs. If you do have throw rugs, attach them to the floor with carpet tape.  Make sure that you have a light switch at the top of the stairs and the bottom of the stairs. If you do not have them, ask someone to add them for you. What else can I do to help prevent falls?  Wear shoes  that:  Do not have high heels.  Have rubber bottoms.  Are comfortable and fit you well.  Are closed at the toe. Do not wear sandals.  If you use a stepladder:  Make sure that it is fully opened. Do not climb a closed stepladder.  Make sure that both sides of the stepladder are locked into place.  Ask someone to hold it for you, if possible.  Clearly mark and make sure that you can see:  Any grab bars or handrails.  First and last steps.  Where the edge of each step is.  Use tools that help you move around (mobility aids) if they are needed. These include:  Canes.  Walkers.  Scooters.  Crutches.  Turn on the lights when you go into a dark area. Replace any light bulbs as soon as they burn out.  Set up your furniture so you have a clear path. Avoid moving your furniture around.  If any of your floors are uneven, fix them.  If there are any pets around you, be aware of where they are.  Review your medicines with your doctor. Some medicines can make you feel dizzy. This can increase your chance of falling. Ask your doctor what other things that you can do to help prevent falls. This information is not intended to replace advice given to you by your health care provider. Make sure you discuss any questions you have with your health care provider. Document Released: 02/11/2009 Document Revised: 09/23/2015 Document Reviewed: 05/22/2014 Elsevier Interactive Patient Education  2017 [employer].

## 2018-10-22 NOTE — Progress Notes (Signed)
Subjective:   Monique Baker is a 68 y.o. female who presents for Medicare Annual (Subsequent) preventive examination.  Review of Systems:   Cardiac Risk Factors include: advanced age (>5155men, 20>65 women);diabetes mellitus;obesity (BMI >30kg/m2);hypertension      Objective:     Vitals: BP 122/80 (BP Location: Right Arm, Patient Position: Sitting, Cuff Size: Large)   Pulse 65   Temp 98.2 F (36.8 C) (Oral)   Resp 16   Ht 5\' 2"  (1.575 m)   Wt 192 lb 1.6 oz (87.1 kg)   SpO2 99%   BMI 35.14 kg/m   Body mass index is 35.14 kg/m.  Advanced Directives 10/22/2018 10/12/2017 03/28/2017 01/18/2017 11/14/2016 08/10/2016 06/08/2016  Does Patient Have a Medical Advance Directive? No No No No No No No  Would patient like information on creating a medical advance directive? Yes (MAU/Ambulatory/Procedural Areas - Information given) Yes (MAU/Ambulatory/Procedural Areas - Information given) No - Patient declined - - - -    Tobacco Social History   Tobacco Use  Smoking Status Never Smoker  Smokeless Tobacco Never Used  Tobacco Comment   smoking cessation materials not required     Counseling given: Not Answered Comment: smoking cessation materials not required   Clinical Intake:  Pre-visit preparation completed: Yes  Pain : No/denies pain     BMI - recorded: 35.14 Nutritional Status: BMI > 30  Obese Nutritional Risks: None Diabetes: Yes CBG done?: No Did pt. bring in CBG monitor from home?: No  How often do you need to have someone help you when you read instructions, pamphlets, or other written materials from your doctor or pharmacy?: 1 - Never  Interpreter Needed?: No  Information entered by :: Reather LittlerKasey Williard Keller LPN  Past Medical History:  Diagnosis Date  . Allergy   . Depression   . GERD (gastroesophageal reflux disease)   . Hyperlipidemia   . Hypertension   . Internal hemorrhoids   . Menopausal disorder   . Osteoarthritis    hands  . Prediabetes   . Wears dentures    partial upper   Past Surgical History:  Procedure Laterality Date  . ABDOMINAL HYSTERECTOMY    . CATARACT EXTRACTION, BILATERAL    . CESAREAN SECTION    . COLONOSCOPY WITH PROPOFOL N/A 03/15/2016   Procedure: COLONOSCOPY WITH PROPOFOL;  Surgeon: Kieth BrightlySeeplaputhur G Sankar, MD;  Location: [facility] ENDOSCOPY;  Service: Endoscopy;  Laterality: N/A;  . HALLUX VALGUS LAPIDUS Left 06/16/2015   Procedure: HALLUX VALGUS LAPIDUS;  Surgeon: Gwyneth RevelsJustin Fowler, DPM;  [facility];  Service: Podiatry;  Laterality: Left;  WITH POPLITEAL  . HAMMER TOE SURGERY Left 06/16/2015   Procedure: HAMMER TOE CORRECTION SECOND TOE LEFT;  Surgeon: Gwyneth RevelsJustin Fowler, DPM;  [facility];  Service: Podiatry;  Laterality: Left;  prediabetic - on oral meds  . hip replacemanet Right 03/15/2018   Alcus DadJames Bowers-Emergeortho  . MYOMECTOMY    . OOPHORECTOMY    . trigger finger Right 12/15/2014  . Vulva cyst  11/2006   Family History  Problem Relation Age of Onset  . Dementia Mother   . Diabetes Father   . Stroke Father   . Cancer Daughter        Fibroid Tumors  . Diabetes Brother   . Breast cancer Neg Hx    Social History   Socioeconomic History  . Marital status: Married    Spouse name: Adela GlimpseBernard  . Number of children: 1  . Years of education: Not on file  .  Highest education level: Bachelor's degree (e.g., BA, AB, BS)  Occupational History  . Occupation: retired Runner, broadcasting/film/videoteacher    [place]  Social Needs  . Financial resource strain: Not hard at all  . Food insecurity    Worry: Never true    Inability: Never true  . Transportation needs    Medical: No    Non-medical: No  Tobacco Use  . Smoking status: Never Smoker  . Smokeless tobacco: Never Used  . Tobacco comment: smoking cessation materials not required  Substance and Sexual Activity  . Alcohol use: No    Alcohol/week: 0.0 standard drinks  . Drug use: No  . Sexual activity: Not Currently    Partners: Male  Lifestyle  .  Physical activity    Days per week: 0 days    Minutes per session: 0 min  . Stress: Only a little  Relationships  . Social connections    Talks on phone: More than three times a week    Gets together: Three times a week    Attends religious service: More than 4 times per year    Active member of club or organization: No    Attends meetings of clubs or organizations: Never    Relationship status: Married  Other Topics Concern  . Not on file  Social History Narrative   Married to a Education officer, environmentalpastor and he is a professor at [employer] and education officer( Sociology )     Outpatient Encounter Medications as of 10/22/2018  Medication Sig  . acetaminophen (TYLENOL) 500 MG tablet Take by mouth. Reported on 11/15/2015  . aspirin EC 81 MG tablet Take 81 mg by mouth daily.  Marland Kitchen. atorvastatin (LIPITOR) 40 MG tablet TAKE 1 TABLET (40 MG TOTAL) BY MOUTH EVERY EVENING. FOR CHOLESTEROL  . benzonatate (TESSALON) 200 MG capsule Take 1 capsule (200 mg total) by mouth 3 (three) times daily as needed for cough.  Marland Kitchen. buPROPion (WELLBUTRIN XL) 150 MG 24 hr tablet TAKE 1 TABLET (150 MG TOTAL) BY MOUTH DAILY BEFORE BREAKFAST.  Marland Kitchen. Cholecalciferol (VITAMIN D PO) Take 1 capsule by mouth daily.   . citalopram (CELEXA) 20 MG tablet TAKE 1 TABLET BY MOUTH EVERY DAY  . Cyanocobalamin (B-12) 1000 MCG SUBL Place 1 each under the tongue once a week.   . docusate sodium (COLACE) 100 MG capsule Take by mouth daily. PRN only  . fluticasone (FLONASE) 50 MCG/ACT nasal spray Place 2 sprays into both nostrils daily.  Marland Kitchen. glucose blood (ONETOUCH VERIO) test strip Use as instructed  . loratadine (CLARITIN) 10 MG tablet TAKE 1 TABLET BY MOUTH EVERY DAY  . metFORMIN (GLUCOPHAGE) 500 MG tablet TAKE 1 TABLET BY MOUTH TWICE A DAY  . nadolol (CORGARD) 20 MG tablet TAKE 1 TABLET BY MOUTH EVERY DAY IN THE EVENING  . Olmesartan-amLODIPine-HCTZ 20-5-12.5 MG TABS TAKE 1 TABLET BY MOUTH EVERY DAY  . triamcinolone cream (KENALOG) 0.1 % Reported on 07/15/2015  . [DISCONTINUED]  magic mouthwash w/lidocaine SOLN Take 5 mLs by mouth 3 (three) times daily as needed for mouth pain.   No facility-administered encounter medications on file as of 10/22/2018.     Activities of Daily Living In your present state of health, do you have any difficulty performing the following activities: 10/22/2018 05/27/2018  Hearing? N N  Comment declines hearing aids -  Vision? N Y  Comment wears glasses -  Difficulty concentrating or making decisions? N N  Walking or climbing stairs? N Y  Comment - -  Dressing or bathing?  N N  Doing errands, shopping? N N  Preparing Food and eating ? N -  Using the Toilet? N -  In the past six months, have you accidently leaked urine? N -  Do you have problems with loss of bowel control? N -  Managing your Medications? N -  Managing your Finances? N -  Housekeeping or managing your Housekeeping? N -  Some recent data might be hidden    Patient Care Team: Steele Sizer, MD as PCP - General (Family Medicine) Christene Lye, MD (General Surgery) Earnestine Leys, MD as Consulting Physician (Orthopedic Surgery) Yolonda Kida, MD as Consulting Physician (Cardiology)    Assessment:   This is a routine wellness examination for [place].  Exercise Activities and Dietary recommendations Current Exercise Habits: The patient does not participate in regular exercise at present, Exercise limited by: orthopedic condition(s)  Goals    . DIET     Recommend to decrease portion sizes by eating 3 small healthy meals and at least 2 healthy snacks per day.    Marland Kitchen DIET - INCREASE WATER INTAKE     Recommend to drink at least 6-8 8oz glasses of water per day.       Fall Risk Fall Risk  10/22/2018 05/27/2018 02/06/2018 01/23/2018 01/14/2018  Falls in the past year? 0 0 Yes No No  Comment - - - - -  Number falls in past yr: 0 - 1 - -  Injury with Fall? 0 - Yes - -  Comment - - broke glasses 06/2017 - -  Risk Factor Category  - - High Fall Risk - -   Risk for fall due to : - - History of fall(s) - -  Risk for fall due to: Comment - - - - -  Follow up Falls prevention discussed - Education provided - -   FALL RISK PREVENTION PERTAINING TO THE HOME:  Any stairs in or around the home? Yes  If so, do they handrails? Yes   Home free of loose throw rugs in walkways, pet beds, electrical cords, etc? Yes  Adequate lighting in your home to reduce risk of falls? Yes   ASSISTIVE DEVICES UTILIZED TO PREVENT FALLS:  Life alert? No  Use of a cane, walker or w/c? No  Grab bars in the bathroom? Yes  Shower chair or bench in shower? Yes  Elevated toilet seat or a handicapped toilet? Yes   DME ORDERS:  DME order needed?  No   TIMED UP AND GO:  Was the test performed? Yes .  Length of time to ambulate 10 feet: 5 sec.   GAIT:  Appearance of gait: Gait stead-fast and without the use of an assistive device.   Education: Fall risk prevention has been discussed.  Intervention(s) required? No   Depression Screen PHQ 2/9 Scores 10/22/2018 05/27/2018 02/06/2018 01/14/2018  PHQ - 2 Score 2 3 0 0  PHQ- 9 Score 3 4 2 3      Cognitive Function     6CIT Screen 10/22/2018 10/12/2017  What Year? 0 points 0 points  What month? 0 points 0 points  What time? 0 points 0 points  Count back from 20 0 points 0 points  Months in reverse 0 points 0 points  Repeat phrase 2 points 0 points  Total Score 2 0    Immunization History  Administered Date(s) Administered  . Influenza, High Dose Seasonal PF 01/18/2017, 01/14/2018  . Influenza,inj,Quad PF,6+ Mos 01/11/2015, 01/17/2016  . Influenza-Unspecified 01/06/2014  .  Pneumococcal Conjugate-13 07/23/2017  . Pneumococcal Polysaccharide-23 08/31/2009  . Tdap 05/10/2010, 07/23/2017  . Zoster 12/28/2011    Qualifies for Shingles Vaccine? Yes  Zostavax completed 2013. Due for Shingrix. Education has been provided regarding the importance of this vaccine. Pt has been advised to call insurance company to  determine out of pocket expense. Advised may also receive vaccine at local pharmacy or Health Dept. Verbalized acceptance and understanding.  Tdap: Up to date  Flu Vaccine: Up to date  Pneumococcal Vaccine: Up to date   Screening Tests Health Maintenance  Topic Date Due  . INFLUENZA VACCINE  11/30/2018  . MAMMOGRAM  04/26/2019  . COLONOSCOPY  03/15/2026  . TETANUS/TDAP  07/24/2027  . DEXA SCAN  Completed  . Hepatitis C Screening  Completed  . PNA vac Low Risk Adult  Completed    Cancer Screenings:  Colorectal Screening: Completed 03/15/16. Repeat every 10 years.  Mammogram: Completed 04/25/18. Repeat every year.   Bone Density: Completed 09/04/16. Results reflect  OSTEOPENIA. Repeat every 2 years. Right hip replacement since last screening.   Lung Cancer Screening: (Low Dose CT Chest recommended if Age 88-80 years, 30 pack-year currently smoking OR have quit w/in 15years.) does not qualify.   Additional Screening:  Hepatitis C Screening: does qualify; Completed 12/28/11  Vision Screening: Recommended annual ophthalmology exams for early detection of glaucoma and other disorders of the eye. Is the patient up to date with their annual eye exam?  Yes  Who is the provider or what is the name of the office in which the pt attends annual eye exams? [facility] Dental Screening: Recommended annual dental exams for proper oral hygiene  Community Resource Referral:  CRR required this visit?  No      Plan:     I have personally reviewed and addressed the Medicare Annual Wellness questionnaire and have noted the following in the patient's chart:  A. Medical and social history B. Use of alcohol, tobacco or illicit drugs  C. Current medications and supplements D. Functional ability and status E.  Nutritional status F.  Physical activity G. Advance directives H. List of other physicians I.  Hospitalizations, surgeries, and ER visits in previous 12 months J.  Vitals  K. Screenings such as hearing and vision if needed, cognitive and depression L. Referrals and appointments   In addition, I have reviewed and discussed with patient certain preventive protocols, quality metrics, and best practice recommendations. A written personalized care plan for preventive services as well as general preventive health recommendations were provided to patient.   Signed,  Reather LittlerKasey Levelle Edelen, LPN Nurse Health Advisor   Nurse Notes: pt doing well and appreciative of visit today. Advised patient to reschedule follow up appt with Dr. Carlynn PurlSowles, due for yearly labs.

## 2018-11-05 ENCOUNTER — Other Ambulatory Visit: Payer: Self-pay | Admitting: Family Medicine

## 2018-11-05 DIAGNOSIS — I10 Essential (primary) hypertension: Secondary | ICD-10-CM

## 2018-11-09 ENCOUNTER — Other Ambulatory Visit: Payer: Self-pay

## 2018-11-09 DIAGNOSIS — Z20822 Contact with and (suspected) exposure to covid-19: Secondary | ICD-10-CM

## 2018-11-11 ENCOUNTER — Ambulatory Visit (INDEPENDENT_AMBULATORY_CARE_PROVIDER_SITE_OTHER): Payer: Medicare Other | Admitting: Family Medicine

## 2018-11-11 ENCOUNTER — Encounter: Payer: Self-pay | Admitting: Family Medicine

## 2018-11-11 ENCOUNTER — Other Ambulatory Visit: Payer: Self-pay

## 2018-11-11 VITALS — Temp 98.0°F | Ht 62.0 in | Wt 190.1 lb

## 2018-11-11 DIAGNOSIS — E8881 Metabolic syndrome: Secondary | ICD-10-CM

## 2018-11-11 DIAGNOSIS — F330 Major depressive disorder, recurrent, mild: Secondary | ICD-10-CM

## 2018-11-11 DIAGNOSIS — R739 Hyperglycemia, unspecified: Secondary | ICD-10-CM

## 2018-11-11 DIAGNOSIS — E538 Deficiency of other specified B group vitamins: Secondary | ICD-10-CM

## 2018-11-11 DIAGNOSIS — E785 Hyperlipidemia, unspecified: Secondary | ICD-10-CM

## 2018-11-11 DIAGNOSIS — I10 Essential (primary) hypertension: Secondary | ICD-10-CM

## 2018-11-11 DIAGNOSIS — E559 Vitamin D deficiency, unspecified: Secondary | ICD-10-CM

## 2018-11-11 MED ORDER — METFORMIN HCL 500 MG PO TABS: 500.0000 mg | ORAL_TABLET | Freq: Two times a day (BID) | ORAL | 1 refills | Status: DC

## 2018-11-11 NOTE — Progress Notes (Signed)
Name: Monique Baker   MRN: [id number]    DOB: 07-Jul-1950   Date:11/11/2018       Progress Note  Subjective  Chief Complaint  Chief Complaint  Patient presents with  . Medication Refill    4 month F/U  . Hypertension  . Depression  . Extreme Obesity  . Right hip pain/OA    Had hip surgery in November 2019 and feeling much better after therapy    I connected with  Monique Baker  on 11/11/18 at  9:00 AM EDT by a video enabled telemedicine application and verified that I am speaking with the correct person using two identifiers.  I discussed the limitations of evaluation and management by telemedicine and the availability of in person appointments. The patient expressed understanding and agreed to proceed. Staff also discussed with the patient that there may be a patient responsible charge related to this service. Patient Location: at home  [facility]   HPI   Pre diabetes/metabolic syndrome: she is taking Metformin, denies side effects, hgbA1C was 5.9%, no polyphagia, polydipsia or polyuria. She is still going to weight watchers and lost 2 lbs since last visit   Right hip pain/OA: she had right hip replacement back in Nov 2019 by Dr. Odis LusterBowers She is now pain free, no longer using a cane and only has one follow up in Nov for the yearly visit   Extreme obesity:she had lost 21lbs since she joined weight watchers 05/2016 and is doing well. Not drinking sweet beverages on a regular basis, still counting points, eating more fruit and vegetables, taking some protein shakes. Her weight at home was down to 191 lbs at home that is a loss of 2 lbs since last visit with me in January   HTN: taking bp medication, denies side effects, no chest pain, no palpitation. BP was recently checked by Reather LittlerKasey Uthus in our office and it was at goal. She does not have a monitor at home   Depression Major: she is married to a Education officer, environmentalpastor, he also works as a Airline pilotprofessor at [employer]  [employer]. We started her on Cymbalta 10/2015 was doing better but caused hair loss, so she is now on Citalopram and states doing well on medication   Patient Active Problem List   Diagnosis Date Noted  . Osteoarthritis of hip 02/19/2018  . Osteopenia of hip 09/04/2016  . History of bunionectomy of left great toe 07/15/2015  . H/O hammer toe correction 03/15/2015  . Arthritis of lumbar spine 01/19/2015  . Benign essential HTN 11/05/2014  . Carpal tunnel syndrome 11/05/2014  . Osteoarthritis 11/05/2014  . Dermatitis, eczematoid 11/05/2014  . Depression, major, recurrent, mild (HCC) 11/05/2014  . Dyslipidemia 11/05/2014  . Gastro-esophageal reflux disease without esophagitis 11/05/2014  . Morbid obesity due to excess calories (HCC) 11/05/2014  . Dysmetabolic syndrome 11/05/2014  . Menopausal and perimenopausal disorder 11/05/2014  . Perennial allergic rhinitis with seasonal variation 11/05/2014  . Seborrhea capitis 11/05/2014  . Acquired trigger finger 11/05/2014  . Urinary incontinence in female 11/05/2014  . Vitamin D deficiency 11/05/2014  . Blood glucose elevated 08/31/2009    Past Surgical History:  Procedure Laterality Date  . ABDOMINAL HYSTERECTOMY    . CATARACT EXTRACTION, BILATERAL    . CESAREAN SECTION    . COLONOSCOPY WITH PROPOFOL N/A 03/15/2016   Procedure: COLONOSCOPY WITH PROPOFOL;  Surgeon: Kieth BrightlySeeplaputhur G Sankar, MD;  Location: [facility];  Service: Endoscopy;  Laterality: N/A;  . HALLUX VALGUS LAPIDUS  Left 06/16/2015   Procedure: HALLUX VALGUS LAPIDUS;  Surgeon: Samara Deist, DPM;  Location: [place];  Service: Podiatry;  Laterality: Left;  WITH POPLITEAL  . HAMMER TOE SURGERY Left 06/16/2015   Procedure: HAMMER TOE CORRECTION SECOND TOE LEFT;  Surgeon: Samara Deist, DPM;  Location: [place];  Service: Podiatry;  Laterality: Left;  prediabetic - on oral meds  . hip replacemanet Right 03/15/2018   Osie Cheeks  .  MYOMECTOMY    . OOPHORECTOMY    . trigger finger Right 12/15/2014  . Vulva cyst  11/2006    Family History  Problem Relation Age of Onset  . Dementia Mother   . Diabetes Father   . Stroke Father   . Cancer Daughter        Fibroid Tumors  . Diabetes Brother   . Breast cancer Neg Hx     Social History   Socioeconomic History  . Marital status: Married    Spouse name: Ilona Sorrel  . Number of children: 1  . Years of education: Not on file  . Highest education level: Bachelor's degree (e.g., BA, AB, BS)  Occupational History  . Occupation: retired Pharmacist, hospital    Comment: [employer]  . Financial resource strain: Not hard at all  . Food insecurity    Worry: Never true    Inability: Never true  . Transportation needs    Medical: No    Non-medical: No  Tobacco Use  . Smoking status: Never Smoker  . Smokeless tobacco: Never Used  . Tobacco comment: smoking cessation materials not required  Substance and Sexual Activity  . Alcohol use: No    Alcohol/week: 0.0 standard drinks  . Drug use: No  . Sexual activity: Not Currently    Partners: Male  Lifestyle  . Physical activity    Days per week: 0 days    Minutes per session: 0 min  . Stress: Only a little  Relationships  . Social connections    Talks on phone: More than three times a week    Gets together: Three times a week    Attends religious service: More than 4 times per year    Active member of club or organization: No    Attends meetings of clubs or organizations: Never    Relationship status: Married  . Intimate partner violence    Fear of current or ex partner: No    Emotionally abused: No    Physically abused: No    Forced sexual activity: No  Other Topics Concern  . 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  acetaminophen (TYLENOL) 500 MG tablet, Take by mouth. Reported on 11/15/2015,  Disp: , Rfl:  .  aspirin EC 81 MG tablet, Take 81 mg by mouth daily., Disp: , Rfl:  .  atorvastatin (LIPITOR) 40 MG tablet, TAKE 1 TABLET (40 MG TOTAL) BY MOUTH EVERY EVENING. FOR CHOLESTEROL, Disp: 90 tablet, Rfl: 1 .  buPROPion (WELLBUTRIN XL) 150 MG 24 hr tablet, TAKE 1 TABLET (150 MG TOTAL) BY MOUTH DAILY BEFORE BREAKFAST., Disp: 90 tablet, Rfl: 1 .  Cholecalciferol (VITAMIN D PO), Take 1 capsule by mouth daily. , Disp: , Rfl:  .  citalopram (CELEXA) 20 MG tablet, TAKE 1 TABLET BY MOUTH EVERY DAY, Disp: 90 tablet, Rfl: 1 .  Cyanocobalamin (B-12) 1000 MCG SUBL,  Place 1 each under the tongue once a week. , Disp: , Rfl:  .  docusate sodium (COLACE) 100 MG capsule, Take by mouth daily. PRN only, Disp: , Rfl:  .  fluticasone (FLONASE) 50 MCG/ACT nasal spray, Place 2 sprays into both nostrils daily., Disp: 48 g, Rfl: 0 .  glucose blood (ONETOUCH VERIO) test strip, Use as instructed, Disp: 100 each, Rfl: 12 .  loratadine (CLARITIN) 10 MG tablet, TAKE 1 TABLET BY MOUTH EVERY DAY, Disp: 90 tablet, Rfl: 1 .  metFORMIN (GLUCOPHAGE) 500 MG tablet, TAKE 1 TABLET BY MOUTH TWICE A DAY, Disp: 180 tablet, Rfl: 0 .  nadolol (CORGARD) 20 MG tablet, TAKE 1 TABLET BY MOUTH EVERY DAY IN THE EVENING, Disp: 90 tablet, Rfl: 1 .  Olmesartan-amLODIPine-HCTZ 20-5-12.5 MG TABS, TAKE 1 TABLET BY MOUTH EVERY DAY, Disp: 90 tablet, Rfl: 0 .  triamcinolone cream (KENALOG) 0.1 %, Reported on 07/15/2015, Disp: , Rfl:  .  benzonatate (TESSALON) 200 MG capsule, Take 1 capsule (200 mg total) by mouth 3 (three) times daily as needed for cough. (Patient not taking: Reported on 11/11/2018), Disp: 30 capsule, Rfl: 1  Allergies  Allergen Reactions  . Duloxetine     sweat    I personally reviewed active problem list, medication list, allergies, family history, social history with the patient/caregiver today.   ROS  Ten systems reviewed and is negative except as mentioned in HPI   Objective  Virtual encounter, vitals not  obtained.  Body mass index is 34.77 kg/m.  Physical Exam  Awake, alert and oriented   PHQ2/9: [facility] screen [facility] 2/9 11/11/2018 10/22/2018 05/27/2018 02/06/2018 01/14/2018  Decreased Interest 0 1 2 0 0  Down, Depressed, Hopeless 1 1 1  0 0  PHQ - 2 Score 1 2 3  0 0  Altered sleeping 0 0 0 0 0  Tired, decreased energy 1 1 1 2 3   Change in appetite 1 0 0 0 0  Feeling bad or failure about yourself  0 0 0 0 0  Trouble concentrating 0 0 0 0 0  Moving slowly or fidgety/restless 0 0 0 0 0  Suicidal thoughts 0 0 0 0 0  PHQ-9 Score 3 3 4 2 3   Difficult doing work/chores Not difficult at all Not difficult at all Not difficult at all Not difficult at all Not difficult at all  Some recent data might be hidden   PHQ-2/9 Result is positive.    Fall Risk: Fall Risk  10/22/2018 05/27/2018 02/06/2018 01/23/2018 01/14/2018  Falls in the past year? 0 0 Yes No No  Comment - - - - -  Number falls in past yr: 0 - 1 - -  Injury with Fall? 0 - Yes - -  Comment - - broke glasses 06/2017 - -  Risk Factor Category  - - High Fall Risk - -  Risk for fall due to : - - History of fall(s) - -  Risk for fall due to: Comment - - - - -  Follow up Falls prevention discussed - Education provided - -    Assessment & Plan  1. Benign essential HTN  - COMPLETE METABOLIC PANEL WITH GFR - CBC with Differential/Platelet  2. Morbid obesity due to excess calories [facility](HCC)  Discussed with the patient the risk posed by an increased BMI. Discussed importance of portion control, calorie counting and at least 150 minutes of physical activity weekly. Avoid sweet beverages and drink more water. Eat at least 6 servings of fruit and  vegetables daily    3. Dyslipidemia  - Lipid panel  4. B12 deficiency  - B12  5. Dysmetabolic syndrome  - metFORMIN (GLUCOPHAGE) 500 MG tablet; Take 1 tablet (500 mg total) by mouth 2 (two) times daily.  Dispense: 180 tablet; Refill: 1  6. Depression, major, recurrent, mild (HCC)   Continue medication, she does not want to add anything else at this time  7. Vitamin D deficiency  - VITAMIN D 25 Hydroxy (Vit-D Deficiency, Fractures)  8. Hyperglycemia  - Hemoglobin A1c  I discussed the assessment and treatment plan with the patient. The patient was provided an opportunity to ask questions and all were answered. The patient agreed with the plan and demonstrated an understanding of the instructions.  The patient was advised to call back or seek an in-person evaluation if the symptoms worsen or if the condition fails to improve as anticipated.  I provided 25 minutes of non-face-to-face time during this encounter.

## 2018-11-15 LAB — VITAMIN B12: Vitamin B-12: 1100 pg/mL (ref 200–1100)

## 2018-11-15 LAB — COMPLETE METABOLIC PANEL WITH GFR
AG Ratio: 1.6 (calc) (ref 1.0–2.5)
ALT: 13 U/L (ref 6–29)
AST: 14 U/L (ref 10–35)
Albumin: 4.1 g/dL (ref 3.6–5.1)
Alkaline phosphatase (APISO): 61 U/L (ref 37–153)
BUN: 14 mg/dL (ref 7–25)
CO2: 27 mmol/L (ref 20–32)
Calcium: 9.6 mg/dL (ref 8.6–10.4)
Chloride: 103 mmol/L (ref 98–110)
Creat: 0.81 mg/dL (ref 0.50–0.99)
GFR, Est African American: 87 mL/min/{1.73_m2} (ref >=60)
GFR, Est Non African American: 75 mL/min/{1.73_m2} (ref >=60)
Globulin: 2.6 g/dL (calc) (ref 1.9–3.7)
Glucose, Bld: 84 mg/dL (ref 65–99)
Potassium: 3.8 mmol/L (ref 3.5–5.3)
Sodium: 141 mmol/L (ref 135–146)
Total Bilirubin: 0.7 mg/dL (ref 0.2–1.2)
Total Protein: 6.7 g/dL (ref 6.1–8.1)

## 2018-11-15 LAB — CBC WITH DIFFERENTIAL/PLATELET
Absolute Monocytes: 826 cells/uL (ref 200–950)
Basophils Absolute: 52 cells/uL (ref 0–200)
Basophils Relative: 0.6 %
Eosinophils Absolute: 267 cells/uL (ref 15–500)
Eosinophils Relative: 3.1 %
HCT: 38.2 % (ref 35.0–45.0)
Hemoglobin: 12.4 g/dL (ref 11.7–15.5)
Lymphs Abs: 1505 cells/uL (ref 850–3900)
MCH: 27.5 pg (ref 27.0–33.0)
MCHC: 32.5 g/dL (ref 32.0–36.0)
MCV: 84.7 fL (ref 80.0–100.0)
MPV: 9.7 fL (ref 7.5–12.5)
Monocytes Relative: 9.6 %
Neutro Abs: 5951 cells/uL (ref 1500–7800)
Neutrophils Relative %: 69.2 %
Platelets: 368 10*3/uL (ref 140–400)
RBC: 4.51 10*6/uL (ref 3.80–5.10)
RDW: 13.2 % (ref 11.0–15.0)
Total Lymphocyte: 17.5 %
WBC: 8.6 10*3/uL (ref 3.8–10.8)

## 2018-11-15 LAB — LIPID PANEL
Cholesterol: 117 mg/dL (ref <200)
HDL: 55 mg/dL (ref >=50)
LDL Cholesterol (Calc): 46 mg/dL (calc)
Non-HDL Cholesterol (Calc): 62 mg/dL (calc) (ref <130)
Total CHOL/HDL Ratio: 2.1 (calc) (ref <5.0)
Triglycerides: 75 mg/dL (ref <150)

## 2018-11-15 LAB — HEMOGLOBIN A1C
Hgb A1c MFr Bld: 5.6 % of total Hgb (ref <5.7)
Mean Plasma Glucose: 114 (calc)
eAG (mmol/L): 6.3 (calc)

## 2018-11-15 LAB — NOVEL CORONAVIRUS, NAA: SARS-CoV-2, NAA: NOT DETECTED

## 2018-11-15 LAB — VITAMIN D 25 HYDROXY (VIT D DEFICIENCY, FRACTURES): Vit D, 25-Hydroxy: 74 ng/mL (ref 30–100)

## 2019-02-14 ENCOUNTER — Other Ambulatory Visit: Payer: Self-pay | Admitting: Family Medicine

## 2019-02-14 DIAGNOSIS — I10 Essential (primary) hypertension: Secondary | ICD-10-CM

## 2019-02-19 ENCOUNTER — Other Ambulatory Visit: Payer: Self-pay

## 2019-02-19 ENCOUNTER — Ambulatory Visit: Payer: Medicare Other | Admitting: Family Medicine

## 2019-02-19 ENCOUNTER — Encounter: Payer: Self-pay | Admitting: Family Medicine

## 2019-02-19 VITALS — BP 130/72 | HR 81 | Temp 96.6°F | Resp 16 | Ht 62.0 in | Wt 195.5 lb

## 2019-02-19 DIAGNOSIS — Z23 Encounter for immunization: Secondary | ICD-10-CM

## 2019-02-19 DIAGNOSIS — R300 Dysuria: Secondary | ICD-10-CM

## 2019-02-19 LAB — POCT URINALYSIS DIPSTICK
Bilirubin, UA: NEGATIVE
Blood, UA: NEGATIVE
Glucose, UA: NEGATIVE
Ketones, UA: NEGATIVE
Nitrite, UA: NEGATIVE
Protein, UA: POSITIVE — AB
Spec Grav, UA: 1.025 (ref 1.010–1.025)
Urobilinogen, UA: NEGATIVE E.U./dL — AB
pH, UA: 5 (ref 5.0–8.0)

## 2019-02-19 MED ORDER — NITROFURANTOIN MONOHYD MACRO 100 MG PO CAPS: 100.0000 mg | ORAL_CAPSULE | Freq: Two times a day (BID) | ORAL | 0 refills | Status: DC

## 2019-02-19 NOTE — Addendum Note (Signed)
Addended by: Steele Sizer F on: 02/19/2019 09:53 AM   Modules accepted: Orders

## 2019-02-19 NOTE — Progress Notes (Addendum)
Name: Monique Baker   MRN: [id number]    DOB: 1951-04-21   Date:02/19/2019       Progress Note  Subjective  Chief Complaint  Chief Complaint  Patient presents with  . Dysuria    Onset-Sunday, burning during her urination, has drank cranberry juice and helped a little     HPI  Dysuria: she states she noticed dysuria and the end of micturition over the past 3 days. She states only change is that she has been drinking sodas again for the past couple of weeks. She has been drinking cranberry juice and is feeling a little better but still having symptoms. Denies fever, chills, back pain, nausea or vomiting. No hematuria or change in bowel movements   Patient Active Problem List   Diagnosis Date Noted  . Osteoarthritis of hip 02/19/2018  . Osteopenia of hip 09/04/2016  . History of bunionectomy of left great toe 07/15/2015  . H/O hammer toe correction 03/15/2015  . Arthritis of lumbar spine 01/19/2015  . Benign essential HTN 11/05/2014  . Carpal tunnel syndrome 11/05/2014  . Osteoarthritis 11/05/2014  . Dermatitis, eczematoid 11/05/2014  . Depression, major, recurrent, mild (HCC) 11/05/2014  . Dyslipidemia 11/05/2014  . Gastro-esophageal reflux disease without esophagitis 11/05/2014  . Morbid obesity due to excess calories (HCC) 11/05/2014  . Dysmetabolic syndrome 11/05/2014  . Menopausal and perimenopausal disorder 11/05/2014  . Perennial allergic rhinitis with seasonal variation 11/05/2014  . Seborrhea capitis 11/05/2014  . Acquired trigger finger 11/05/2014  . Urinary incontinence in female 11/05/2014  . Vitamin D deficiency 11/05/2014  . Blood glucose elevated 08/31/2009    Past Surgical History:  Procedure Laterality Date  . ABDOMINAL HYSTERECTOMY    . CATARACT EXTRACTION, BILATERAL    . CESAREAN SECTION    . COLONOSCOPY WITH PROPOFOL N/A 03/15/2016   Procedure: COLONOSCOPY WITH PROPOFOL;  Surgeon: Kieth Brightly, MD;  Location: [facility];  Service:  Endoscopy;  Laterality: N/A;  . HALLUX VALGUS LAPIDUS Left 06/16/2015   Procedure: HALLUX VALGUS LAPIDUS;  Surgeon: Gwyneth Revels, DPM;  Location[facility];  Service: Podiatry;  Laterality: Left;  WITH POPLITEAL  . HAMMER TOE SURGERY Left 06/16/2015   Procedure: HAMMER TOE CORRECTION SECOND TOE LEFT;  Surgeon: Gwyneth Revels, DPM;  Location: [facility];  Service: Podiatry;  Laterality: Left;  prediabetic - on oral meds  . hip replacemanet Right 03/15/2018   Alcus Dad  . MYOMECTOMY    . OOPHORECTOMY    . trigger finger Right 12/15/2014  . Vulva cyst  11/2006    Family History  Problem Relation Age of Onset  . Dementia Mother   . Diabetes Father   . Stroke Father   . Cancer Daughter        Fibroid Tumors  . Diabetes Brother   . Breast cancer Neg Hx     Social History   Socioeconomic History  . Marital status: Married    Spouse name: Adela Glimpse  . Number of children: 1  . Years of education: Not on file  . Highest education level: Bachelor's degree (e.g., BA, AB, BS)  Occupational History  . Occupation: retired Runner, broadcasting/film/video    Comment: Hospital doctor  . Financial resource strain: Not hard at all  . Food insecurity    Worry: Never true    Inability: Never true  . Transportation needs    Medical: No    Non-medical: No  Tobacco Use  . Smoking status: Never Smoker  . Smokeless  tobacco: Never Used  . Tobacco comment: smoking cessation materials not required  Substance and Sexual Activity  . Alcohol use: No    Alcohol/week: 0.0 standard drinks  . Drug use: No  . Sexual activity: Not Currently    Partners: Male  Lifestyle  . Physical activity    Days per week: 0 days    Minutes per session: 0 min  . Stress: Only a little  Relationships  . Social connections    Talks on phone: More than three times a week    Gets together: Three times a week    Attends religious service: More than 4 times per year    Active member of club or  organization: No    Attends meetings of clubs or organizations: Never    Relationship status: Married  . Intimate partner violence    Fear of current or ex partner: No    Emotionally abused: No    Physically abused: No    Forced sexual activity: No  Other Topics Concern  . 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  acetaminophen (TYLENOL) 500 MG tablet, Take by mouth. Reported on 11/15/2015, Disp: , Rfl:  .  aspirin EC 81 MG tablet, Take 81 mg by mouth daily., Disp: , Rfl:  .  atorvastatin (LIPITOR) 40 MG tablet, TAKE 1 TABLET (40 MG TOTAL) BY MOUTH EVERY EVENING. FOR CHOLESTEROL, Disp: 90 tablet, Rfl: 1 .  buPROPion (WELLBUTRIN XL) 150 MG 24 hr tablet, TAKE 1 TABLET (150 MG TOTAL) BY MOUTH DAILY BEFORE BREAKFAST., Disp: 90 tablet, Rfl: 1 .  Cholecalciferol (VITAMIN D PO), Take 1 capsule by mouth daily. , Disp: , Rfl:  .  citalopram (CELEXA) 20 MG tablet, TAKE 1 TABLET BY MOUTH EVERY DAY, Disp: 90 tablet, Rfl: 1 .  Cyanocobalamin (B-12) 1000 MCG SUBL, Place 1 each under the tongue once a week. , Disp: , Rfl:  .  docusate sodium (COLACE) 100 MG capsule, Take by mouth daily. PRN only, Disp: , Rfl:  .  fluticasone (FLONASE) 50 MCG/ACT nasal spray, Place 2 sprays into both nostrils daily., Disp: 48 g, Rfl: 0 .  glucose blood (ONETOUCH VERIO) test strip, Use as instructed, Disp: 100 each, Rfl: 12 .  loratadine (CLARITIN) 10 MG tablet, TAKE 1 TABLET BY MOUTH EVERY DAY, Disp: 90 tablet, Rfl: 1 .  metFORMIN (GLUCOPHAGE) 500 MG tablet, Take 1 tablet (500 mg total) by mouth 2 (two) times daily., Disp: 180 tablet, Rfl: 1 .  nadolol (CORGARD) 20 MG tablet, TAKE 1 TABLET BY MOUTH EVERY DAY IN THE EVENING, Disp: 90 tablet, Rfl: 1 .  Olmesartan-amLODIPine-HCTZ 20-5-12.5 MG TABS, TAKE 1 TABLET BY MOUTH EVERY DAY, Disp: 90 tablet, Rfl: 0 .  triamcinolone cream (KENALOG) 0.1 %, Reported on 07/15/2015,  Disp: , Rfl:   Allergies  Allergen Reactions  . Duloxetine     sweat    I personally reviewed active problem list, medication list, allergies, family history, social history, health maintenance with the patient/caregiver today.   ROS  Ten systems reviewed and is negative except as mentioned in HPI   Objective  Vitals:   02/19/19 0914  BP: 130/72  Pulse: 81  Resp: 16  Temp: (!) 96.6 F (35.9 C)  TempSrc: Temporal  SpO2: 99%  Weight: 195 lb 8 oz (88.7 kg)  Height: 5\' 2"  (1.575 m)  Body mass index is 35.76 kg/m.  Physical Exam  Constitutional: Patient appears well-developed and well-nourished. Obese  No distress.  HEENT: head atraumatic, normocephalic, pupils equal and reactive to light Cardiovascular: Normal rate, regular rhythm and normal heart sounds.  No murmur heard. No BLE edema. Pulmonary/Chest: Effort normal and breath sounds normal. No respiratory distress. Abdominal: Soft.  There is no tenderness. Negative CVA tenderness  Psychiatric: Patient has a normal mood and affect. behavior is normal. Judgment and thought content normal.  Recent Results (from the past 2160 hour(s))  POCT urinalysis dipstick     Status: Abnormal   Collection Time: 02/19/19  9:22 AM  Result Value Ref Range   Color, UA dark yellow    Clarity, UA clear    Glucose, UA Negative Negative   Bilirubin, UA neg    Ketones, UA neg    Spec Grav, UA 1.025 1.010 - 1.025   Blood, UA neg    pH, UA 5.0 5.0 - 8.0   Protein, UA Positive (A) Negative    Comment: Trace 30+   Urobilinogen, UA negative (A) 0.2 or 1.0 E.U./dL   Nitrite, UA neg    Leukocytes, UA Moderate (2+) (A) Negative   Appearance     Odor       PHQ2/9: Depression screen [facility] 2/9 02/19/2019 11/11/2018 10/22/2018 05/27/2018 02/06/2018  Decreased Interest 0 0 1 2 0  Down, Depressed, Hopeless 0  PHQ - 2 Score 0  Altered sleeping 0 0 0 0 0  Tired, decreased energy Change in appetite 1 1 0 0 0   Feeling bad or failure about yourself  0 0 0 0 0  Trouble concentrating 0 0 0 0 0  Moving slowly or fidgety/restless 0 0 0 0 0  Suicidal thoughts 0 0 0 0 0  PHQ-9 Score Difficult doing work/chores Not difficult at all Not difficult at all Not difficult at all Not difficult at all Not difficult at all  Some recent data might be hidden    phq 9 is positive   Fall Risk: Fall Risk  02/19/2019 10/22/2018 05/27/2018 02/06/2018 01/23/2018  Falls in the past year? 0 0 0 Yes No  Comment - - - - -  Number falls in past yr: 0 0 - 1 -  Injury with Fall? 0 0 - Yes -  Comment - - - broke glasses 06/2017 -  Risk Factor Category  - - - High Fall Risk -  Risk for fall due to : - - - History of fall(s) -  Risk for fall due to: Comment - - - - -  Follow up - Falls prevention discussed - Education provided -     Functional Status Survey: Is the patient deaf or have difficulty hearing?: No Does the patient have difficulty seeing, even when wearing glasses/contacts?: Yes Does the patient have difficulty concentrating, remembering, or making decisions?: No Does the patient have difficulty walking or climbing stairs?: No Does the patient have difficulty dressing or bathing?: No Does the patient have difficulty doing errands alone such as visiting a doctor's office or shopping?: No    Assessment & Plan  1. Dysuria  - POCT urinalysis dipstick - CULTURE, URINE COMPREHENSIVE  - nitrofurantoin, macrocrystal-monohydrate, (MACROBID) 100 MG capsule; Take 1 capsule (100 mg total) by mouth 2 (two) times daily.  Dispense: 10 capsule; Refill: 0   2. Need for  immunization against influenza  - Flu Vaccine QUAD High Dose(Fluad)

## 2019-02-21 LAB — CULTURE, URINE COMPREHENSIVE
MICRO NUMBER:: 1016845
SPECIMEN QUALITY:: ADEQUATE

## 2019-02-24 ENCOUNTER — Other Ambulatory Visit: Payer: Self-pay

## 2019-02-24 ENCOUNTER — Encounter: Payer: Self-pay | Admitting: Family Medicine

## 2019-02-24 ENCOUNTER — Ambulatory Visit (INDEPENDENT_AMBULATORY_CARE_PROVIDER_SITE_OTHER): Payer: Medicare Other | Admitting: Family Medicine

## 2019-02-24 VITALS — BP 112/68 | HR 64 | Temp 97.1°F | Resp 16 | Ht 62.0 in | Wt 195.1 lb

## 2019-02-24 DIAGNOSIS — F330 Major depressive disorder, recurrent, mild: Secondary | ICD-10-CM

## 2019-02-24 DIAGNOSIS — Z131 Encounter for screening for diabetes mellitus: Secondary | ICD-10-CM

## 2019-02-24 DIAGNOSIS — E8881 Metabolic syndrome: Secondary | ICD-10-CM

## 2019-02-24 DIAGNOSIS — R739 Hyperglycemia, unspecified: Secondary | ICD-10-CM

## 2019-02-24 DIAGNOSIS — R05 Cough: Secondary | ICD-10-CM

## 2019-02-24 DIAGNOSIS — R059 Cough, unspecified: Secondary | ICD-10-CM

## 2019-02-24 DIAGNOSIS — I10 Essential (primary) hypertension: Secondary | ICD-10-CM | POA: Diagnosis not present

## 2019-02-24 DIAGNOSIS — E785 Hyperlipidemia, unspecified: Secondary | ICD-10-CM

## 2019-02-24 DIAGNOSIS — Z01419 Encounter for gynecological examination (general) (routine) without abnormal findings: Secondary | ICD-10-CM

## 2019-02-24 LAB — POCT GLYCOSYLATED HEMOGLOBIN (HGB A1C): Hemoglobin A1C: 5 % (ref 4.0–5.6)

## 2019-02-24 MED ORDER — ATORVASTATIN CALCIUM 40 MG PO TABS: 40.0000 mg | ORAL_TABLET | Freq: Every evening | ORAL | 1 refills | Status: DC

## 2019-02-24 MED ORDER — BUPROPION HCL ER (XL) 150 MG PO TB24: 150.0000 mg | ORAL_TABLET | Freq: Every day | ORAL | 1 refills | Status: DC

## 2019-02-24 MED ORDER — BENZONATATE 100 MG PO CAPS: 100.0000 mg | ORAL_CAPSULE | Freq: Two times a day (BID) | ORAL | 0 refills | Status: DC | PRN

## 2019-02-24 MED ORDER — NADOLOL 20 MG PO TABS: 10.0000 mg | ORAL_TABLET | Freq: Every day | ORAL | 0 refills | Status: DC

## 2019-02-24 MED ORDER — METFORMIN HCL 500 MG PO TABS: 500.0000 mg | ORAL_TABLET | Freq: Two times a day (BID) | ORAL | 1 refills | Status: DC

## 2019-02-24 NOTE — Progress Notes (Signed)
Name: Monique Baker   MRN: [id number]    DOB: Jan 11, 1951   Date:02/24/2019       Progress Note  Subjective  Chief Complaint  Chief Complaint  Patient presents with  . Annual Exam    HPI  Patient presents for annual CPE and follow up  Pre diabetes/metabolic syndrome: she is taking Metformin, denies side effects, hgbA1C was 5.0%, it was 5.9% two years ago, she denies  polyphagia, polydipsia or polyuria.She is still using [employer] app but not having meetings.   Right hip pain/OA:she had right hip replacement back in Nov 2019 by Dr. Harlow Mares She is now pain free, no longer using a cane , on yearly follow ups now   Extreme obesity: she joined weight watchers 05/2016 at a weight of 224 lbs Not drinking sweet beverages on a regular basis,stillcounting points, eating more fruit and vegetables, taking some protein shakes. Weight today was 193 lbs, she is down 2 dress sizes  HTN: taking bp medication, denies side effects, no chest pain, no palpitation.BP is towards low end of normal, no dizziness, but we will cut down dose of Nadolol to half pill daily and if remains low without palpitation we will stop medication on her next visit   Depression Major: she is married to a Theme park manager, he also works as a Network engineer at [employer] We started her on Cymbalta 10/2015 was doing better but caused hair loss, so she is now on Citalopram and now on Wellbutrin XL, she states stress at church is less because the services are outside and activities is less frequent. She asked to go down on dose of medication but advised to wait until the Spring  Diet: she will try to use her exercise bike and walk again  Exercise: avoiding sweet teas and sodas   USPSTF grade A and B recommendations    Office Visit from 02/24/2019 in [facility]  AUDIT-C Score  0     Depression: Phq 9 is  negative Depression screen [facility] 2/9 02/24/2019 02/24/2019 02/19/2019 11/11/2018 10/22/2018  Decreased  Interest 0 0 0 0 1  Down, Depressed, Hopeless 1 0 '1 1 1  ' PHQ - 2 Score 1 0 '1 1 2  ' Altered sleeping 0 0 0 0 0  Tired, decreased energy 1 0 '1 1 1  ' Change in appetite 0 0 1 1 0  Feeling bad or failure about yourself  0 0 0 0 0  Trouble concentrating 0 0 0 0 0  Moving slowly or fidgety/restless 0 0 0 0 0  Suicidal thoughts 0 0 0 0 0  PHQ-9 Score 2 0 '3 3 3  ' Difficult doing work/chores Not difficult at all - Not difficult at all Not difficult at all Not difficult at all  Some recent data might be hidden   Hypertension: BP Readings from Last 3 Encounters:  02/24/19 112/68  02/19/19 130/72  10/22/18 122/80   Obesity: Wt Readings from Last 3 Encounters:  02/24/19 195 lb 1.6 oz (88.5 kg)  02/19/19 195 lb 8 oz (88.7 kg)  11/11/18 190 lb 1.6 oz (86.2 kg)   BMI Readings from Last 3 Encounters:  02/24/19 35.68 kg/m  02/19/19 35.76 kg/m  11/11/18 34.77 kg/m     Hep C Screening: up to date STD testing and prevention (HIV/chl/gon/syphilis): N/A Intimate partner violence: negative screen  Sexual History/Pain during Intercourse: not current  Menstrual History/LMP/Abnormal Bleeding:s/p hysterectomy  Incontinence Symptoms: no problems   Breast cancer:  - Last Mammogram: repeat  Dec 2020  - BRCA gene screening: N/A  Osteoporosis Screening: 08/2016  Cervical cancer screening: not applicable   Skin cancer: discussed atypical lesions  Colorectal cancer: repeat in 2027  Lung cancer:  Low Dose CT Chest recommended if Age 19-80 years, 30 pack-year currently smoking OR have quit w/in 15years. Patient does not qualify.   ECG:12/2017   Advanced Care Planning: A voluntary discussion about advance care planning including the explanation and discussion of advance directives.  Discussed health care proxy and Living will, and the patient was able to identify a health care proxy as husband   Patient does not have a living will at present time. If patient does have living will, I have requested they  bring this to the clinic to be scanned in to their chart.  Lipids: Lab Results  Component Value Date   CHOL 117 11/14/2018   CHOL 124 01/23/2018   CHOL 98 06/08/2016   Lab Results  Component Value Date   HDL 55 11/14/2018   HDL 59 01/23/2018   HDL 47 (L) 06/08/2016   Lab Results  Component Value Date   LDLCALC 46 11/14/2018   LDLCALC 47 01/23/2018   LDLCALC 33 06/08/2016   Lab Results  Component Value Date   TRIG 75 11/14/2018   TRIG 96 01/23/2018   TRIG 91 06/08/2016   Lab Results  Component Value Date   CHOLHDL 2.1 11/14/2018   CHOLHDL 2.1 01/23/2018   CHOLHDL 2.1 06/08/2016   No results found for: LDLDIRECT  Glucose: Glucose, Bld  Date Value Ref Range Status  11/14/2018 84 65 - 99 mg/dL Final    Comment:    .            Fasting reference interval .   01/23/2018 82 65 - 99 mg/dL Final    Comment:    .            Fasting reference interval .   03/19/2017 97 65 - 99 mg/dL Final    Comment:    .            Fasting reference interval .    Glucose-Capillary  Date Value Ref Range Status  03/15/2016 110 (H) 65 - 99 mg/dL Final  06/16/2015 80 65 - 99 mg/dL Final  06/16/2015 93 65 - 99 mg/dL Final    Patient Active Problem List   Diagnosis Date Noted  . Osteoarthritis of hip 02/19/2018  . Osteopenia of hip 09/04/2016  . History of bunionectomy of left great toe 07/15/2015  . H/O hammer toe correction 03/15/2015  . Arthritis of lumbar spine 01/19/2015  . Benign essential HTN 11/05/2014  . Carpal tunnel syndrome 11/05/2014  . Osteoarthritis 11/05/2014  . Dermatitis, eczematoid 11/05/2014  . Depression, major, recurrent, mild ([place]) 11/05/2014  . Dyslipidemia 11/05/2014  . Gastro-esophageal reflux disease without esophagitis 11/05/2014  . Morbid obesity due to excess calories ([place]) 11/05/2014  . Dysmetabolic syndrome 74/03/8785  . Menopausal and perimenopausal disorder 11/05/2014  . Perennial allergic rhinitis with seasonal variation 11/05/2014   . Seborrhea capitis 11/05/2014  . Acquired trigger finger 11/05/2014  . Urinary incontinence in female 11/05/2014  . Vitamin D deficiency 11/05/2014  . Blood glucose elevated 08/31/2009    Past Surgical History:  Procedure Laterality Date  . ABDOMINAL HYSTERECTOMY    . CATARACT EXTRACTION, BILATERAL    . CESAREAN SECTION    . COLONOSCOPY WITH PROPOFOL N/A 03/15/2016   Procedure: COLONOSCOPY WITH PROPOFOL;  Surgeon: Christene Lye, MD;  Location:  [facility];  Service: Endoscopy;  Laterality: N/A;  . HALLUX VALGUS LAPIDUS Left 06/16/2015   Procedure: HALLUX VALGUS LAPIDUS;  Surgeon: Samara Deist, DPM;  Location: [facility];  Service: Podiatry;  Laterality: Left;  WITH POPLITEAL  . HAMMER TOE SURGERY Left 06/16/2015   Procedure: HAMMER TOE CORRECTION SECOND TOE LEFT;  Surgeon: Samara Deist, DPM;  Location: [facility];  Service: Podiatry;  Laterality: Left;  prediabetic - on oral meds  . hip replacemanet Right 03/15/2018   Osie Cheeks  . MYOMECTOMY    . OOPHORECTOMY    . trigger finger Right 12/15/2014  . Vulva cyst  11/2006    Family History  Problem Relation Age of Onset  . Dementia Mother   . Diabetes Father   . Stroke Father   . Cancer Daughter        Fibroid Tumors  . Diabetes Brother   . Breast cancer Neg Hx     Social History   Socioeconomic History  . Marital status: Married    Spouse name: Ilona Sorrel  . Number of children: 1  . Years of education: Not on file  . Highest education level: Bachelor's degree (e.g., BA, AB, BS)  Occupational History  . Occupation: retired Pharmacist, hospital    Comment: [employer]  . Financial resource strain: Not hard at all  . Food insecurity    Worry: Never true    Inability: Never true  . Transportation needs    Medical: No    Non-medical: No  Tobacco Use  . Smoking status: Never Smoker  . Smokeless tobacco: Never Used  . Tobacco comment: smoking cessation materials not  required  Substance and Sexual Activity  . Alcohol use: No    Alcohol/week: 0.0 standard drinks  . Drug use: No  . Sexual activity: Not Currently    Partners: Male  Lifestyle  . Physical activity    Days per week: 0 days    Minutes per session: 0 min  . Stress: Only a little  Relationships  . Social connections    Talks on phone: More than three times a week    Gets together: Three times a week    Attends religious service: More than 4 times per year    Active member of club or organization: No    Attends meetings of clubs or organizations: Never    Relationship status: Married  . Intimate partner violence    Fear of current or ex partner: No    Emotionally abused: No    Physically abused: No    Forced sexual activity: No  Other Topics Concern  . 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  acetaminophen (TYLENOL) 500 MG tablet, Take by mouth. Reported on 11/15/2015, Disp: , Rfl:  .  aspirin EC 81 MG tablet, Take 81 mg by mouth daily., Disp: , Rfl:  .  atorvastatin (LIPITOR) 40 MG tablet, TAKE 1 TABLET (40 MG TOTAL) BY MOUTH EVERY EVENING. FOR CHOLESTEROL, Disp: 90 tablet, Rfl: 1 .  buPROPion (WELLBUTRIN XL) 150 MG 24 hr tablet, TAKE 1 TABLET (150 MG TOTAL) BY MOUTH DAILY BEFORE BREAKFAST., Disp: 90 tablet, Rfl: 1 .  Cholecalciferol (VITAMIN D PO), Take 1 capsule by mouth daily. , Disp: , Rfl:  .  citalopram (CELEXA) 20 MG tablet, TAKE 1 TABLET BY MOUTH EVERY DAY,  Disp: 90 tablet, Rfl: 1 .  Cyanocobalamin (B-12) 1000 MCG SUBL, Place 1 each under the tongue once a week. , Disp: , Rfl:  .  docusate sodium (COLACE) 100 MG capsule, Take by mouth daily. PRN only, Disp: , Rfl:  .  fluticasone (FLONASE) 50 MCG/ACT nasal spray, Place 2 sprays into both nostrils daily., Disp: 48 g, Rfl: 0 .  glucose blood (ONETOUCH VERIO) test strip, Use as instructed, Disp: 100 each, Rfl: 12 .   loratadine (CLARITIN) 10 MG tablet, TAKE 1 TABLET BY MOUTH EVERY DAY, Disp: 90 tablet, Rfl: 1 .  metFORMIN (GLUCOPHAGE) 500 MG tablet, Take 1 tablet (500 mg total) by mouth 2 (two) times daily., Disp: 180 tablet, Rfl: 1 .  nadolol (CORGARD) 20 MG tablet, TAKE 1 TABLET BY MOUTH EVERY DAY IN THE EVENING, Disp: 90 tablet, Rfl: 1 .  Olmesartan-amLODIPine-HCTZ 20-5-12.5 MG TABS, TAKE 1 TABLET BY MOUTH EVERY DAY, Disp: 90 tablet, Rfl: 0 .  triamcinolone cream (KENALOG) 0.1 %, Reported on 07/15/2015, Disp: , Rfl:  .  nitrofurantoin, macrocrystal-monohydrate, (MACROBID) 100 MG capsule, Take 1 capsule (100 mg total) by mouth 2 (two) times daily. (Patient not taking: Reported on 02/24/2019), Disp: 10 capsule, Rfl: 0  Allergies  Allergen Reactions  . Duloxetine     sweat     ROS  Constitutional: Negative for fever or weight change.  Respiratory: positive  for cough when she goes to bed from post-nasal drainage, but no  shortness of breath.   Cardiovascular: Negative for chest pain or palpitations.  Gastrointestinal: Negative for abdominal pain, no bowel changes.  Musculoskeletal: Negative for gait problem or joint swelling.  Skin: Negative for rash.  Neurological: Negative for dizziness or headache.  No other specific complaints in a complete review of systems (except as listed in HPI above).  Objective  Vitals:   02/24/19 1028  BP: 112/68  Pulse: 64  Resp: 16  Temp: (!) 97.1 F (36.2 C)  TempSrc: Temporal  SpO2: 99%  Weight: 195 lb 1.6 oz (88.5 kg)  Height: '5\' 2"'  (1.575 m)    Body mass index is 35.68 kg/m.  Physical Exam  Constitutional: Patient appears well-developed and well-nourished. Obesity. No distress.  HENT: Head: Normocephalic and atraumatic. Ears: B TMs ok, no erythema or effusion; Nose: Nose normal. Mouth/Throat: Oropharynx is clear and moist. No oropharyngeal exudate.  Eyes: Conjunctivae and EOM are normal. Pupils are equal, round, and reactive to light. No scleral  icterus.  Neck: Normal range of motion. Neck supple. No JVD present. No thyromegaly present.  Cardiovascular: Normal rate, regular rhythm and normal heart sounds.  No murmur heard. No BLE edema. Pulmonary/Chest: Effort normal and breath sounds normal. No respiratory distress. Abdominal: Soft. Bowel sounds are normal, no distension. There is no tenderness. no masses Breast: no lumps or masses, no nipple discharge or rashes FEMALE GENITALIA:  Not done RECTAL:not done  Musculoskeletal: Normal range of motion, no joint effusions. No gross deformities Neurological: he is alert and oriented to person, place, and time. No cranial nerve deficit. Coordination, balance, strength, speech and gait are normal.  Skin: Skin is warm and dry. No rash noted. No erythema.  Psychiatric: Patient has a normal mood and affect. behavior is normal. Judgment and thought content normal.  Recent Results (from the past 2160 hour(s))  CULTURE, URINE COMPREHENSIVE     Status: Abnormal   Collection Time: 02/19/19 12:00 AM   Specimen: Urine  Result Value Ref Range   MICRO NUMBER: [id number]  SPECIMEN QUALITY: Adequate    Source OTHER (SPECIFY)    STATUS: FINAL    ISOLATE 1: Escherichia coli (A)     Comment: Greater than 100,000 CFU/mL of Escherichia coli      Susceptibility   Escherichia coli - CULT, URN, SPECIAL NEGATIVE 1    AMOX/CLAVULANIC 4 Sensitive     AMPICILLIN >=32 Resistant     AMPICILLIN/SULBACTAM 8 Sensitive     CEFAZOLIN* <=4 Not Reportable      * For infections other than uncomplicated UTIcaused by E. coli, K. pneumoniae or P. mirabilis:Cefazolin is resistant if MIC > or = 8 mcg/mL.(Distinguishing susceptible versus intermediatefor isolates with MIC < or = 4 mcg/mL requiresadditional testing.)For uncomplicated UTI caused by E. coli,K. pneumoniae or P. mirabilis: Cefazolin issusceptible if MIC <32 mcg/mL and predictssusceptible to the oral agents cefaclor, cefdinir,cefpodoxime, cefprozil, cefuroxime,  cephalexinand loracarbef.    CEFEPIME <=1 Sensitive     CEFTRIAXONE <=1 Sensitive     CIPROFLOXACIN >=4 Resistant     LEVOFLOXACIN >=8 Resistant     ERTAPENEM <=0.5 Sensitive     GENTAMICIN <=1 Sensitive     IMIPENEM <=0.25 Sensitive     NITROFURANTOIN <=16 Sensitive     PIP/TAZO <=4 Sensitive     TOBRAMYCIN <=1 Sensitive     TRIMETH/SULFA* <=20 Sensitive      * For infections other than uncomplicated UTIcaused by E. coli, K. pneumoniae or P. mirabilis:Cefazolin is resistant if MIC > or = 8 mcg/mL.(Distinguishing susceptible versus intermediatefor isolates with MIC < or = 4 mcg/mL requiresadditional testing.)For uncomplicated UTI caused by E. coli,K. pneumoniae or P. mirabilis: Cefazolin issusceptible if MIC <32 mcg/mL and predictssusceptible to the oral agents cefaclor, cefdinir,cefpodoxime, cefprozil, cefuroxime, cephalexinand loracarbef.Legend:S = Susceptible  I = IntermediateR = Resistant  NS = Not susceptible* = Not tested  NR = Not reported**NN = See antimicrobic comments  POCT urinalysis dipstick     Status: Abnormal   Collection Time: 02/19/19  9:22 AM  Result Value Ref Range   Color, UA dark yellow    Clarity, UA clear    Glucose, UA Negative Negative   Bilirubin, UA neg    Ketones, UA neg    Spec Grav, UA 1.025 1.010 - 1.025   Blood, UA neg    pH, UA 5.0 5.0 - 8.0   Protein, UA Positive (A) Negative    Comment: Trace 30+   Urobilinogen, UA negative (A) 0.2 or 1.0 E.U./dL   Nitrite, UA neg    Leukocytes, UA Moderate (2+) (A) Negative   Appearance     Odor    POCT HgB A1C     Status: Normal   Collection Time: 02/24/19 10:34 AM  Result Value Ref Range   Hemoglobin A1C 5.0 4.0 - 5.6 %   HbA1c POC (<> result, manual entry)     HbA1c, POC (prediabetic range)     HbA1c, POC (controlled diabetic range)        Fall Risk: Fall Risk  02/24/2019 02/19/2019 10/22/2018 05/27/2018 02/06/2018  Falls in the past year? 0 0 0 0 Yes  Comment - - - - -  Number falls in past yr: 0 0 0  - 1  Injury with Fall? 0 0 0 - Yes  Comment - - - - broke glasses 06/2017  Risk Factor Category  - - - - High Fall Risk  Risk for fall due to : - - - - History of fall(s)  Risk for fall due to: Comment - - - - -  Follow up - - Falls prevention discussed - Education provided     Functional Status Survey: Is the patient deaf or have difficulty hearing?: No Does the patient have difficulty seeing, even when wearing glasses/contacts?: No Does the patient have difficulty concentrating, remembering, or making decisions?: No Does the patient have difficulty walking or climbing stairs?: No Does the patient have difficulty dressing or bathing?: No Does the patient have difficulty doing errands alone such as visiting a doctor's office or shopping?: No   Assessment & Plan  1. Well woman exam   2. Diabetes mellitus screening   3. Benign essential HTN  - nadolol (CORGARD) 20 MG tablet; Take 0.5 tablets (10 mg total) by mouth daily.  Dispense: 45 tablet; Refill: 0  4. Dysmetabolic syndrome  - metFORMIN (GLUCOPHAGE) 500 MG tablet; Take 1 tablet (500 mg total) by mouth 2 (two) times daily.  Dispense: 180 tablet; Refill: 1  5. Depression, major, recurrent, mild (HCC)  - buPROPion (WELLBUTRIN XL) 150 MG 24 hr tablet; Take 1 tablet (150 mg total) by mouth daily before breakfast.  Dispense: 90 tablet; Refill: 1  6. Dyslipidemia  - atorvastatin (LIPITOR) 40 MG tablet; Take 1 tablet (40 mg total) by mouth every evening. for cholesterol  Dispense: 90 tablet; Refill: 1  7. Hyperglycemia  - POCT HgB A1C - metFORMIN (GLUCOPHAGE) 500 MG tablet; Take 1 tablet (500 mg total) by mouth 2 (two) times daily.  Dispense: 180 tablet; Refill: 1  -USPSTF grade A and B recommendations reviewed with patient; age-appropriate recommendations, preventive care, screening tests, etc discussed and encouraged; healthy living encouraged; see AVS for patient education given to patient -Discussed importance of 150  minutes of physical activity weekly, eat two servings of fish weekly, eat one serving of tree nuts ( cashews, pistachios, pecans, almonds.Marland Kitchen) every other day, eat 6 servings of fruit/vegetables daily and drink plenty of water and avoid sweet beverages.

## 2019-02-24 NOTE — Patient Instructions (Signed)
Preventive Care 68 Years and Older, Female Preventive care refers to lifestyle choices and visits with your health care provider that can promote health and wellness. This includes:  A yearly physical exam. This is also called an annual well check.  Regular dental and eye exams.  Immunizations.  Screening for certain conditions.  Healthy lifestyle choices, such as diet and exercise. What can I expect for my preventive care visit? Physical exam Your health care provider will check:  Height and weight. These may be used to calculate body mass index (BMI), which is a measurement that tells if you are at a healthy weight.  Heart rate and blood pressure.  Your skin for abnormal spots. Counseling Your health care provider may ask you questions about:  Alcohol, tobacco, and drug use.  Emotional well-being.  Home and relationship well-being.  Sexual activity.  Eating habits.  History of falls.  Memory and ability to understand (cognition).  Work and work Statistician.  Pregnancy and menstrual history. What immunizations do I need?  Influenza (flu) vaccine  This is recommended every year. Tetanus, diphtheria, and pertussis (Tdap) vaccine  You may need a Td booster every 10 years. Varicella (chickenpox) vaccine  You may need this vaccine if you have not already been vaccinated. Zoster (shingles) vaccine  You may need this after age 33. Pneumococcal conjugate (PCV13) vaccine  One dose is recommended after age 33. Pneumococcal polysaccharide (PPSV23) vaccine  One dose is recommended after age 72. Measles, mumps, and rubella (MMR) vaccine  You may need at least one dose of MMR if you were born in 1957 or later. You may also need a second dose. Meningococcal conjugate (MenACWY) vaccine  You may need this if you have certain conditions. Hepatitis A vaccine  You may need this if you have certain conditions or if you travel or work in places where you may be exposed  to hepatitis A. Hepatitis B vaccine  You may need this if you have certain conditions or if you travel or work in places where you may be exposed to hepatitis B. Haemophilus influenzae type b (Hib) vaccine  You may need this if you have certain conditions. You may receive vaccines as individual doses or as more than one vaccine together in one shot (combination vaccines). Talk with your health care provider about the risks and benefits of combination vaccines. What tests do I need? Blood tests  Lipid and cholesterol levels. These may be checked every 5 years, or more frequently depending on your overall health.  Hepatitis C test.  Hepatitis B test. Screening  Lung cancer screening. You may have this screening every year starting at age 39 if you have a 30-pack-year history of smoking and currently smoke or have quit within the past 15 years.  Colorectal cancer screening. All adults should have this screening starting at age 36 and continuing until age 15. Your health care provider may recommend screening at age 23 if you are at increased risk. You will have tests every 1-10 years, depending on your results and the type of screening test.  Diabetes screening. This is done by checking your blood sugar (glucose) after you have not eaten for a while (fasting). You may have this done every 1-3 years.  Mammogram. This may be done every 1-2 years. Talk with your health care provider about how often you should have regular mammograms.  BRCA-related cancer screening. This may be done if you have a family history of breast, ovarian, tubal, or peritoneal cancers.  Other tests  Sexually transmitted disease (STD) testing.  Bone density scan. This is done to screen for osteoporosis. You may have this done starting at age 73. Follow these instructions at home: Eating and drinking  Eat a diet that includes fresh fruits and vegetables, whole grains, lean protein, and low-fat dairy products. Limit  your intake of foods with high amounts of sugar, saturated fats, and salt.  Take vitamin and mineral supplements as recommended by your health care provider.  Do not drink alcohol if your health care provider tells you not to drink.  If you drink alcohol: ? Limit how much you have to 0-1 drink a day. ? Be aware of how much alcohol is in your drink. In the [place] one drink equals one 12 oz bottle of beer (355 mL), one 5 oz glass of wine (148 mL), or one 1 oz glass of hard liquor (44 mL). Lifestyle  Take daily care of your teeth and gums.  Stay active. Exercise for at least 30 minutes on 5 or more days each week.  Do not use any products that contain nicotine or tobacco, such as cigarettes, e-cigarettes, and chewing tobacco. If you need help quitting, ask your health care provider.  If you are sexually active, practice safe sex. Use a condom or other form of protection in order to prevent STIs (sexually transmitted infections).  Talk with your health care provider about taking a low-dose aspirin or statin. What's next?  Go to your health care provider once a year for a well check visit.  Ask your health care provider how often you should have your eyes and teeth checked.  Stay up to date on all vaccines. This information is not intended to replace advice given to you by your health care provider. Make sure you discuss any questions you have with your health care provider. Document Released: 05/14/2015 Document Revised: 04/11/2018 Document Reviewed: 04/11/2018 Elsevier Patient Education  2020 [employer].

## 2019-03-03 IMAGING — MG MM DIGITAL SCREENING BILAT W/ CAD
5 series · 5 of 5 positions shown · non-contrast
Comparison: Previous exam(s).

ACR Breast Density Category a: The breast tissue is almost entirely
fatty.

CLINICAL DATA: Screening.

EXAM:
DIGITAL SCREENING BILATERAL MAMMOGRAM WITH CAD

[L MLO]
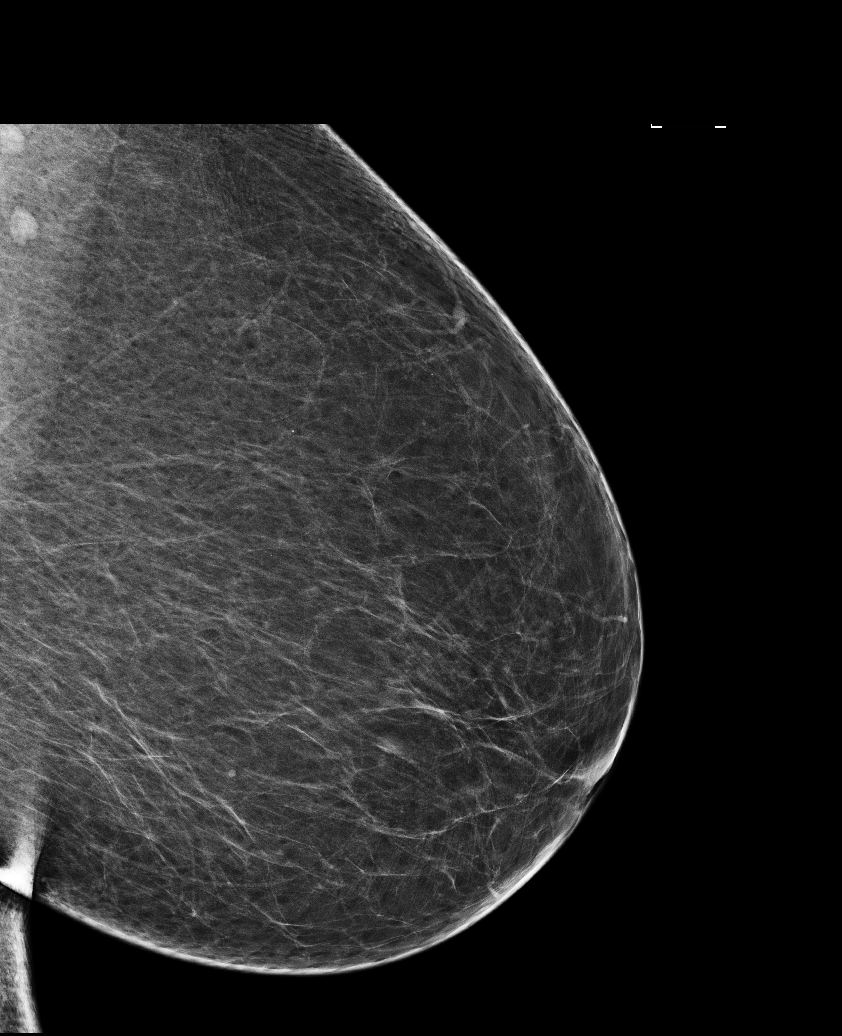

[R CC]
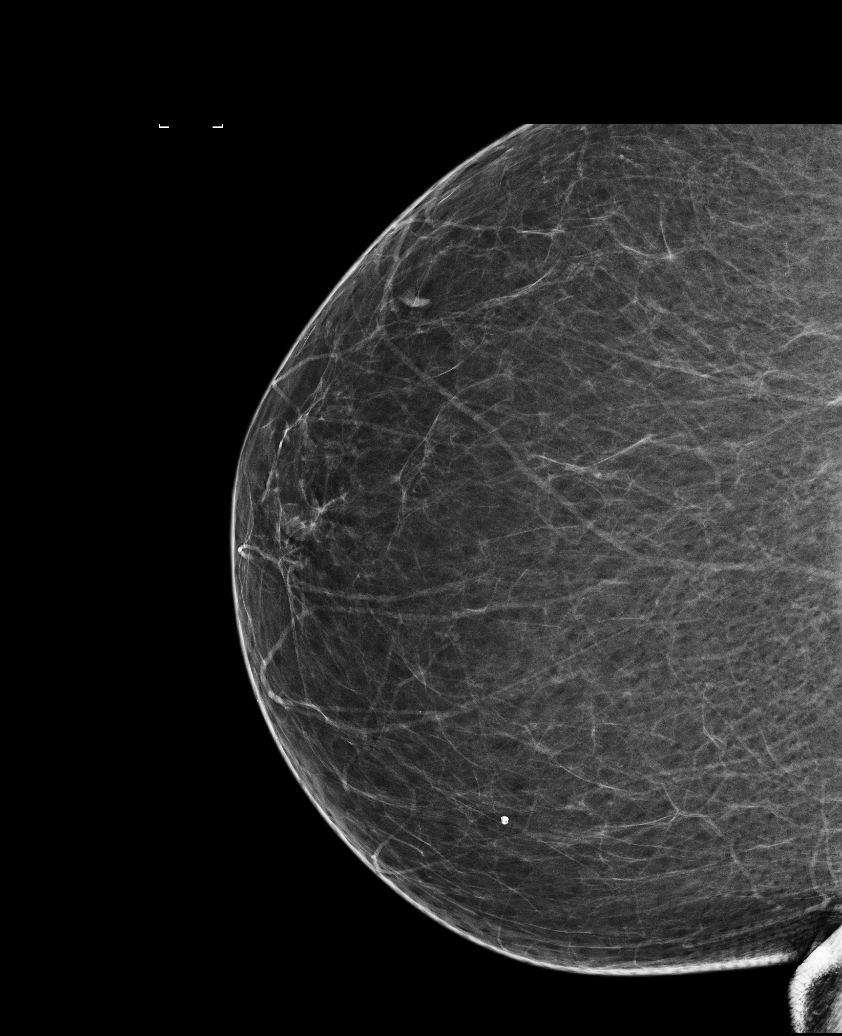

[R CV]
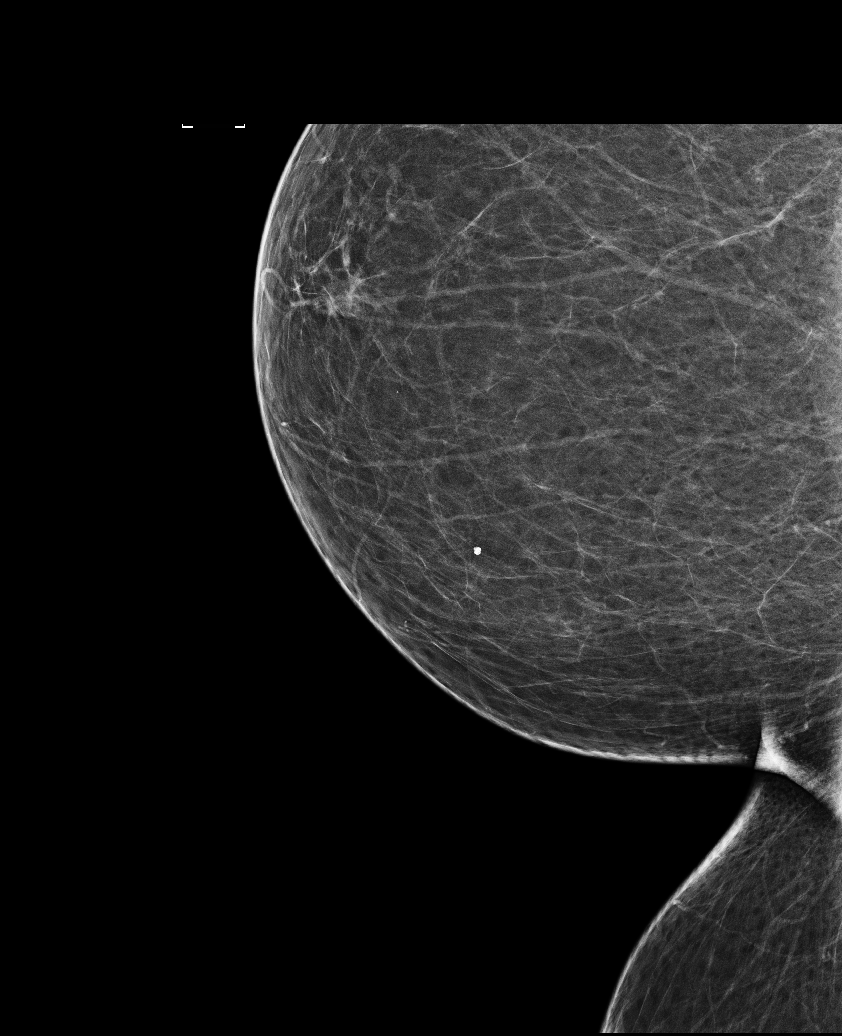

[L CC]
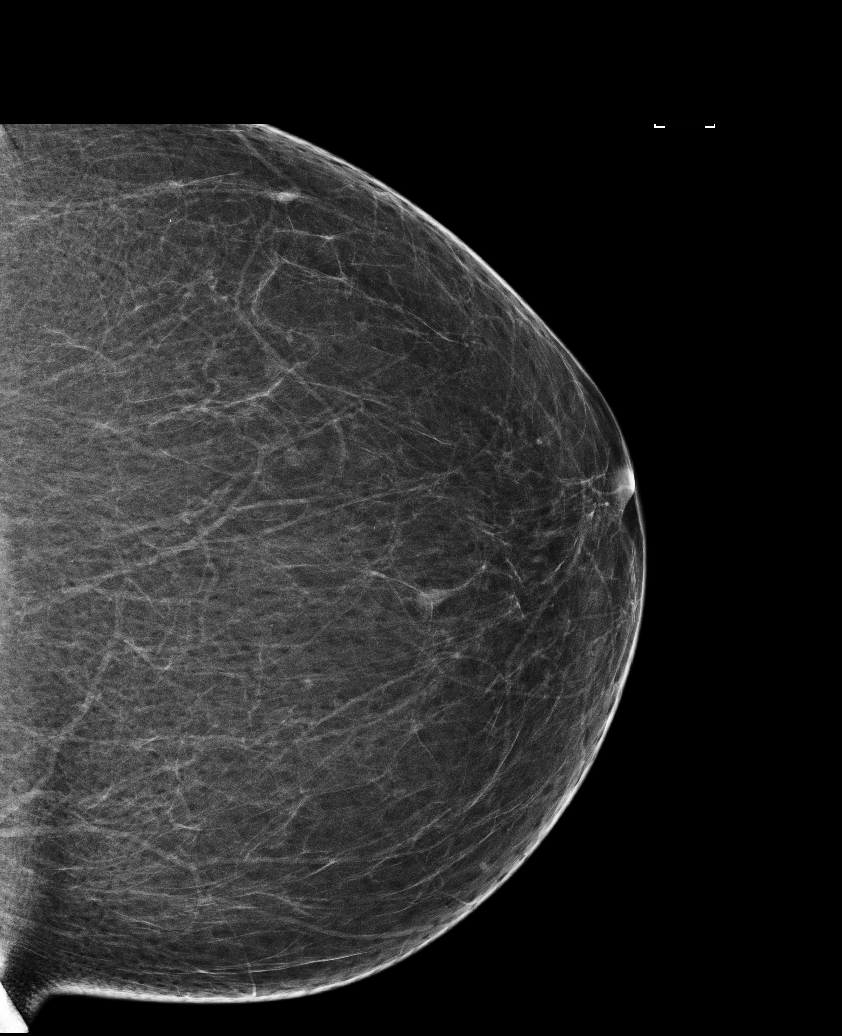

[R MLO]
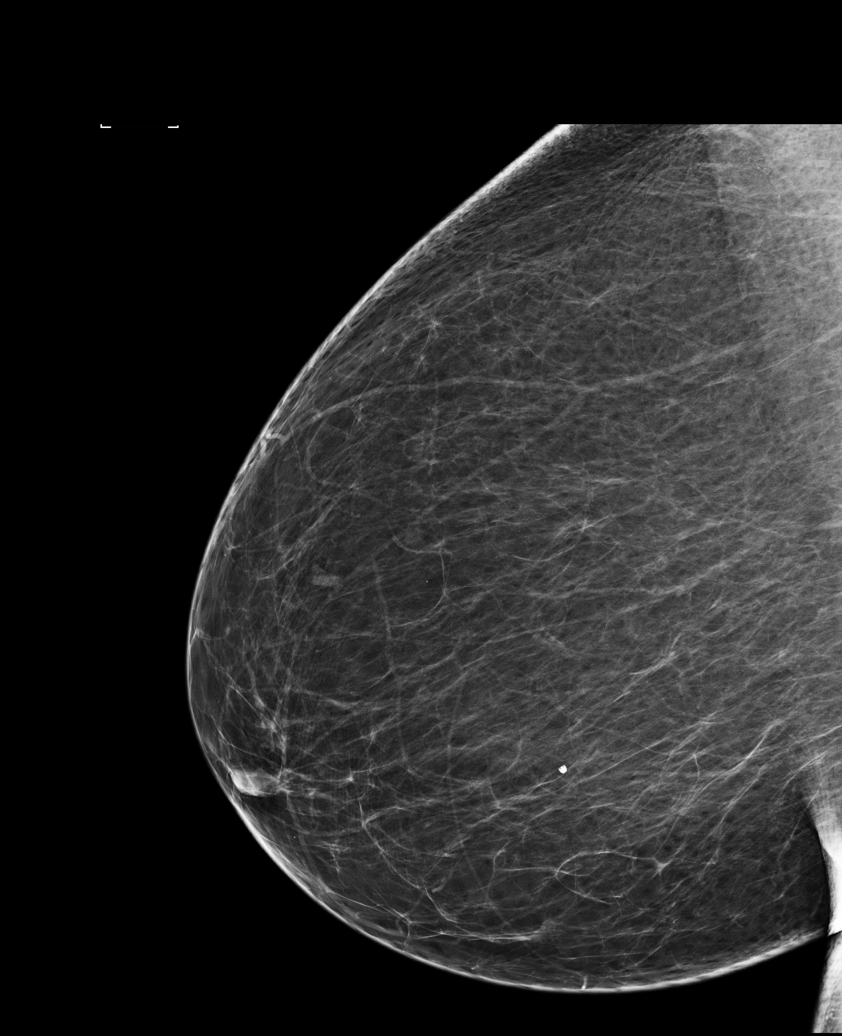

[5 of 5 positions shown; findings below may reference images not displayed]

FINDINGS: There are no findings suspicious for malignancy. Images were
processed with CAD.
IMPRESSION: No mammographic evidence of malignancy. A result letter of this
screening mammogram will be mailed directly to the patient.

RECOMMENDATION:
Screening mammogram in one year. (Code:[id number])

BI-RADS CATEGORY  1: Negative.

## 2019-03-08 ENCOUNTER — Other Ambulatory Visit: Payer: Self-pay

## 2019-03-08 DIAGNOSIS — Z20822 Contact with and (suspected) exposure to covid-19: Secondary | ICD-10-CM

## 2019-03-09 LAB — NOVEL CORONAVIRUS, NAA: SARS-CoV-2, NAA: NOT DETECTED

## 2019-04-17 ENCOUNTER — Other Ambulatory Visit: Payer: Self-pay | Admitting: Family Medicine

## 2019-04-17 DIAGNOSIS — F411 Generalized anxiety disorder: Secondary | ICD-10-CM

## 2019-04-17 DIAGNOSIS — F330 Major depressive disorder, recurrent, mild: Secondary | ICD-10-CM

## 2019-04-17 DIAGNOSIS — I10 Essential (primary) hypertension: Secondary | ICD-10-CM

## 2019-04-18 ENCOUNTER — Other Ambulatory Visit: Payer: Self-pay | Admitting: Family Medicine

## 2019-04-18 DIAGNOSIS — J302 Other seasonal allergic rhinitis: Secondary | ICD-10-CM

## 2019-04-18 DIAGNOSIS — J3089 Other allergic rhinitis: Secondary | ICD-10-CM

## 2019-05-13 ENCOUNTER — Other Ambulatory Visit: Payer: Self-pay | Admitting: Family Medicine

## 2019-05-13 DIAGNOSIS — I10 Essential (primary) hypertension: Secondary | ICD-10-CM

## 2019-06-12 ENCOUNTER — Other Ambulatory Visit: Payer: Self-pay | Admitting: Family Medicine

## 2019-06-12 DIAGNOSIS — Z1231 Encounter for screening mammogram for malignant neoplasm of breast: Secondary | ICD-10-CM

## 2019-07-01 ENCOUNTER — Ambulatory Visit (INDEPENDENT_AMBULATORY_CARE_PROVIDER_SITE_OTHER): Payer: Medicare PPO | Admitting: Family Medicine

## 2019-07-01 ENCOUNTER — Encounter: Payer: Self-pay | Admitting: Family Medicine

## 2019-07-01 ENCOUNTER — Other Ambulatory Visit: Payer: Self-pay

## 2019-07-01 DIAGNOSIS — R739 Hyperglycemia, unspecified: Secondary | ICD-10-CM

## 2019-07-01 DIAGNOSIS — E8881 Metabolic syndrome: Secondary | ICD-10-CM

## 2019-07-01 DIAGNOSIS — F330 Major depressive disorder, recurrent, mild: Secondary | ICD-10-CM | POA: Diagnosis not present

## 2019-07-01 DIAGNOSIS — I10 Essential (primary) hypertension: Secondary | ICD-10-CM | POA: Diagnosis not present

## 2019-07-01 DIAGNOSIS — F411 Generalized anxiety disorder: Secondary | ICD-10-CM

## 2019-07-01 MED ORDER — OLMESARTAN-AMLODIPINE-HCTZ 20-5-12.5 MG PO TABS: 1.0000 | ORAL_TABLET | Freq: Every day | ORAL | 1 refills | Status: DC

## 2019-07-01 MED ORDER — NADOLOL 20 MG PO TABS: 20.0000 mg | ORAL_TABLET | Freq: Every day | ORAL | 1 refills | Status: DC

## 2019-07-01 MED ORDER — METFORMIN HCL 500 MG PO TABS: 500.0000 mg | ORAL_TABLET | Freq: Two times a day (BID) | ORAL | 1 refills | Status: DC

## 2019-07-01 MED ORDER — BUPROPION HCL ER (XL) 150 MG PO TB24: 150.0000 mg | ORAL_TABLET | Freq: Every day | ORAL | 1 refills | Status: DC

## 2019-07-01 NOTE — Progress Notes (Signed)
Name: Monique Baker   MRN: [id number]    DOB: [birth year]   Date:07/01/2019       Progress Note  Subjective  Chief Complaint  Chief Complaint  Patient presents with  . Hyperlipidemia    4 month follow up  . Hypertension  . Depression    stopped Wellbutrin will discuss options    I connected with  Dayton Scrape  on 07/01/19 at  9:20 AM EST by a video enabled telemedicine application and verified that I am speaking with the correct person using two identifiers.  I discussed the limitations of evaluation and management by telemedicine and the availability of in person appointments. The patient expressed understanding and agreed to proceed. Staff also discussed with the patient that there may be a patient responsible charge related to this service. Patient Location: at home  Provider Location: [facility]    HPI  Pre diabetes/metabolic syndrome: she is taking Metformin, denies side effects, hgbA1C was 5.0%, it was 5.9% two years ago, she denies  polyphagia, polydipsia or polyuria.She stopped Weight Watchers for a period of time, she gained 5 lbs but resumed the program this week. 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 Vena Rua is now pain free, no longer using a cane , on yearly follow ups now   Extreme obesity: she joined weight watchers 05/2016 at a weight of 224 lbs  Weight has been stable for hte past year. She resumed program last week, she had been off the program for months. She states she has been using her stationary bike more often while watching   HTN: taking bp medication, denies side effects, no chest pain, no palpitation.BP was towards low end of normal, we tried to cut dose of Nadolol in half but unable to break the pill, so she is taking a whole pill daily .   Depression Major: she is married to a Education officer, environmental, he also works as a Airline pilot at [employer]. We started her on Cymbalta  07/2017was doing better but caused hair loss, so she is now on Citalopram and  Wellbutrin XL, she ran out of Wellbutrin because there was not a refill left at the pharmacy and she has noticed decrease in energy level.Marland Kitchen She would like to resume medication   Patient Active Problem List   Diagnosis Date Noted  . Osteoarthritis of hip 02/19/2018  . Osteopenia of hip 09/04/2016  . History of bunionectomy of left great toe 07/15/2015  . H/O hammer toe correction 03/15/2015  . Arthritis of lumbar spine 01/19/2015  . Benign essential HTN 11/05/2014  . Carpal tunnel syndrome 11/05/2014  . Osteoarthritis 11/05/2014  . Dermatitis, eczematoid 11/05/2014  . Depression, major, recurrent, mild (HCC) 11/05/2014  . Dyslipidemia 11/05/2014  . Gastro-esophageal reflux disease without esophagitis 11/05/2014  . Morbid obesity due to excess calories (HCC) 11/05/2014  . Dysmetabolic syndrome 11/05/2014  . Menopausal and perimenopausal disorder 11/05/2014  . Perennial allergic rhinitis with seasonal variation 11/05/2014  . Seborrhea capitis 11/05/2014  . Acquired trigger finger 11/05/2014  . Urinary incontinence in female 11/05/2014  . Vitamin D deficiency 11/05/2014  . Blood glucose elevated 08/31/2009    Past Surgical History:  Procedure Laterality Date  . ABDOMINAL HYSTERECTOMY    . CATARACT EXTRACTION, BILATERAL    . CESAREAN SECTION    . COLONOSCOPY WITH PROPOFOL N/A 03/15/2016   Procedure: COLONOSCOPY WITH PROPOFOL;  Surgeon: Kieth Brightly, MD;  Location: [facility] ENDOSCOPY;  Service: Endoscopy;  Laterality: N/A;  . HALLUX VALGUS LAPIDUS Left 06/16/2015   Procedure: HALLUX VALGUS LAPIDUS;  Surgeon: Gwyneth Revels, DPM;  Location[facility];  Service: Podiatry;  Laterality: Left;  WITH POPLITEAL  . HAMMER TOE SURGERY Left 06/16/2015   Procedure: HAMMER TOE CORRECTION SECOND TOE LEFT;  Surgeon: Gwyneth Revels, DPM;  Location: [facility];  Service: Podiatry;  Laterality: Left;   prediabetic - on oral meds  . hip replacemanet Right 03/15/2018   Alcus Dad  . MYOMECTOMY    . OOPHORECTOMY    . trigger finger Right 12/15/2014  . Vulva cyst  11/2006    Family History  Problem Relation Age of Onset  . Dementia Mother   . Diabetes Father   . Stroke Father   . Cancer Daughter        Fibroid Tumors  . Diabetes Brother   . Breast cancer Neg Hx     Social History   Tobacco Use  . Smoking status: Never Smoker  . Smokeless tobacco: Never Used  . Tobacco comment: smoking cessation materials not required  Substance Use Topics  . Alcohol use: No    Alcohol/week: 0.0 standard drinks    Current Outpatient Medications:  .  acetaminophen (TYLENOL) 500 MG tablet, Take by mouth. Reported on 11/15/2015, Disp: , Rfl:  .  aspirin EC 81 MG tablet, Take 81 mg by mouth daily., Disp: , Rfl:  .  atorvastatin (LIPITOR) 40 MG tablet, Take 1 tablet (40 mg total) by mouth every evening. for cholesterol, Disp: 90 tablet, Rfl: 1 .  Cholecalciferol (VITAMIN D PO), Take 1 capsule by mouth daily. , Disp: , Rfl:  .  citalopram (CELEXA) 20 MG tablet, TAKE 1 TABLET BY MOUTH EVERY DAY, Disp: 90 tablet, Rfl: 1 .  Cyanocobalamin (B-12) 1000 MCG SUBL, Place 1 each under the tongue once a week. , Disp: , Rfl:  .  docusate sodium (COLACE) 100 MG capsule, Take by mouth daily. PRN only, Disp: , Rfl:  .  fluticasone (FLONASE) 50 MCG/ACT nasal spray, SPRAY 2 SPRAYS INTO EACH NOSTRIL EVERY DAY, Disp: 48 mL, Rfl: 0 .  glucose blood (ONETOUCH VERIO) test strip, Use as instructed, Disp: 100 each, Rfl: 12 .  loratadine (CLARITIN) 10 MG tablet, TAKE 1 TABLET BY MOUTH EVERY DAY, Disp: 90 tablet, Rfl: 1 .  metFORMIN (GLUCOPHAGE) 500 MG tablet, Take 1 tablet (500 mg total) by mouth 2 (two) times daily., Disp: 180 tablet, Rfl: 1 .  nadolol (CORGARD) 20 MG tablet, TAKE 1 TABLET BY MOUTH EVERY DAY IN THE EVENING, Disp: 90 tablet, Rfl: 1 .  Olmesartan-amLODIPine-HCTZ 20-5-12.5 MG TABS, TAKE 1 TABLET  BY MOUTH EVERY DAY, Disp: 90 tablet, Rfl: 0 .  triamcinolone cream (KENALOG) 0.1 %, Reported on 07/15/2015, Disp: , Rfl:  .  benzonatate (TESSALON) 100 MG capsule, Take 1-2 capsules (100-200 mg total) by mouth 2 (two) times daily as needed. (Patient not taking: Reported on 07/01/2019), Disp: 40 capsule, Rfl: 0 .  buPROPion (WELLBUTRIN XL) 150 MG 24 hr tablet, Take 1 tablet (150 mg total) by mouth daily before breakfast. (Patient not taking: Reported on 07/01/2019), Disp: 90 tablet, Rfl: 1  Allergies  Allergen Reactions  . Duloxetine     sweat    I personally reviewed active problem list, medication list, allergies, family history, social history, health maintenance with the patient/caregiver today.   ROS  Ten systems reviewed and is negative except as mentioned in HPI  Objective  Virtual encounter, vitals obtained at home  Today's Vitals   07/01/19 1036  BP: 126/78  Weight: 195 lb (88.5 kg)   Body mass index is 35.67 kg/m.  Physical Exam  Awake, alert and oriented   PHQ2/9: Depression screen [facility] 2/9 07/01/2019 02/24/2019 02/24/2019 02/19/2019 11/11/2018  Decreased Interest 1 0 0 0 0  Down, Depressed, Hopeless 0 1 0 1 1  PHQ - 2 Score 1 1 0 1 1  Altered sleeping 0 0 0 0 0  Tired, decreased energy 1 1 0 1 1  Change in appetite 1 0 0 1 1  Feeling bad or failure about yourself  0 0 0 0 0  Trouble concentrating 0 0 0 0 0  Moving slowly or fidgety/restless 0 0 0 0 0  Suicidal thoughts 0 0 0 0 0  PHQ-9 Score 3 2 0 3 3  Difficult doing work/chores Not difficult at all Not difficult at all - Not difficult at all Not difficult at all  Some recent data might be hidden   PHQ-2/9 Result is positive.    Fall Risk: Fall Risk  07/01/2019 02/24/2019 02/19/2019 10/22/2018 05/27/2018  Falls in the past year? 0 0 0 0 0  Comment - - - - -  Number falls in past yr: 0 0 0 0 -  Injury with Fall? 0 0 0 0 -  Comment - - - - -  Risk Factor Category  - - - - -  Risk for fall due to : - - - - -    Risk for fall due to: Comment - - - - -  Follow up Falls evaluation completed - - Falls prevention discussed -    Assessment & Plan  1. Benign essential HTN  - Olmesartan-amLODIPine-HCTZ 20-5-12.5 MG TABS; Take 1 tablet by mouth daily.  Dispense: 90 tablet; Refill: 1 - nadolol (CORGARD) 20 MG tablet; Take 1 tablet (20 mg total) by mouth daily.  Dispense: 90 tablet; Refill: 1  2. Dysmetabolic syndrome  - metFORMIN (GLUCOPHAGE) 500 MG tablet; Take 1 tablet (500 mg total) by mouth 2 (two) times daily.  Dispense: 180 tablet; Refill: 1  3. Hyperglycemia  - metFORMIN (GLUCOPHAGE) 500 MG tablet; Take 1 tablet (500 mg total) by mouth 2 (two) times daily.  Dispense: 180 tablet; Refill: 1  4. Depression, major, recurrent, mild (HCC)  - buPROPion (WELLBUTRIN XL) 150 MG 24 hr tablet; Take 1 tablet (150 mg total) by mouth daily before breakfast.  Dispense: 90 tablet; Refill: 1  5. GAD - buPROPion (WELLBUTRIN XL) 150 MG 24 hr tablet; Take 1 tablet (150 mg total) by mouth daily before breakfast.  Dispense: 90 tablet; Refill: 1  I discussed the assessment and treatment plan with the patient. The patient was provided an opportunity to ask questions and all were answered. The patient agreed with the plan and demonstrated an understanding of the instructions.  The patient was advised to call back or seek an in-person evaluation if the symptoms worsen or if the condition fails to improve as anticipated.  I provided 25 minutes of non-face-to-face time during this encounter.

## 2019-07-07 ENCOUNTER — Ambulatory Visit
Admission: RE | Admit: 2019-07-07 | Discharge: 2019-07-07 | Disposition: A | Discharge status: Home / Self Care | Payer: Medicare PPO | Source: Ambulatory Visit | Attending: Family Medicine | Admitting: Family Medicine

## 2019-07-07 DIAGNOSIS — Z1231 Encounter for screening mammogram for malignant neoplasm of breast: Secondary | ICD-10-CM | POA: Diagnosis not present

## 2019-07-14 ENCOUNTER — Other Ambulatory Visit: Payer: Self-pay | Admitting: Family Medicine

## 2019-07-14 DIAGNOSIS — J302 Other seasonal allergic rhinitis: Secondary | ICD-10-CM

## 2019-10-15 ENCOUNTER — Other Ambulatory Visit: Payer: Self-pay

## 2019-10-15 DIAGNOSIS — E785 Hyperlipidemia, unspecified: Secondary | ICD-10-CM

## 2019-10-15 DIAGNOSIS — F411 Generalized anxiety disorder: Secondary | ICD-10-CM

## 2019-10-15 DIAGNOSIS — F330 Major depressive disorder, recurrent, mild: Secondary | ICD-10-CM

## 2019-10-15 MED ORDER — ATORVASTATIN CALCIUM 40 MG PO TABS: 40.0000 mg | ORAL_TABLET | Freq: Every evening | ORAL | 1 refills | Status: DC

## 2019-10-15 MED ORDER — CITALOPRAM HYDROBROMIDE 20 MG PO TABS: 20.0000 mg | ORAL_TABLET | Freq: Every day | ORAL | 0 refills | Status: DC

## 2019-10-28 ENCOUNTER — Ambulatory Visit (INDEPENDENT_AMBULATORY_CARE_PROVIDER_SITE_OTHER): Payer: Medicare PPO

## 2019-10-28 DIAGNOSIS — Z0000 Encounter for general adult medical examination without abnormal findings: Secondary | ICD-10-CM

## 2019-10-28 NOTE — Progress Notes (Signed)
Subjective:   Monique Baker is a 69 y.o. female who presents for Medicare Annual (Subsequent) preventive examination.  Virtual Visit via Telephone Note  I connected with  Monique Baker on 10/28/19 at  9:20 AM EDT by telephone and verified that I am speaking with the correct person using two identifiers.  Medicare Annual Wellness visit completed telephonically due to Covid-19 pandemic.   Location: Patient: home Provider: office   I discussed the limitations, risks, security and privacy concerns of performing an evaluation and management service by telephone and the availability of in person appointments. The patient expressed understanding and agreed to proceed.  Unable to perform video visit due to video visit attempted and failed and/or patient does not have video capability.   Some vital signs may be absent or patient reported.   Reather Littler, LPN    Review of Systems     Cardiac Risk Factors include: diabetes mellitus;hypertension;obesity (BMI >30kg/m2)     Objective:    There were no vitals filed for this visit. There is no height or weight on file to calculate BMI.  Advanced Directives 10/28/2019 10/22/2018 10/12/2017 03/28/2017 01/18/2017 11/14/2016 08/10/2016  Does Patient Have a Medical Advance Directive? No No No No No No No  Would patient like information on creating a medical advance directive? Yes (MAU/Ambulatory/Procedural Areas - Information given) Yes (MAU/Ambulatory/Procedural Areas - Information given) Yes (MAU/Ambulatory/Procedural Areas - Information given) No - Patient declined - - -    Current Medications (verified) Outpatient Encounter Medications as of 10/28/2019  Medication Sig  . acetaminophen (TYLENOL) 500 MG tablet Take by mouth. Reported on 11/15/2015  . aspirin EC 81 MG tablet Take 81 mg by mouth daily.  Marland Kitchen atorvastatin (LIPITOR) 40 MG tablet Take 1 tablet (40 mg total) by mouth every evening. for cholesterol  . buPROPion (WELLBUTRIN XL) 150 MG 24 hr  tablet Take 1 tablet (150 mg total) by mouth daily before breakfast.  . Cholecalciferol (VITAMIN D PO) Take 1 capsule by mouth daily. 3000 IU daily  . citalopram (CELEXA) 20 MG tablet Take 1 tablet (20 mg total) by mouth daily.  . Cyanocobalamin (B-12) 1000 MCG SUBL Place 1 each under the tongue 2 (two) times a week.   . docusate sodium (COLACE) 100 MG capsule Take by mouth daily. PRN only  . fluticasone (FLONASE) 50 MCG/ACT nasal spray SPRAY 2 SPRAYS INTO EACH NOSTRIL EVERY DAY  . glucose blood (ONETOUCH VERIO) test strip Use as instructed  . loratadine (CLARITIN) 10 MG tablet TAKE 1 TABLET BY MOUTH EVERY DAY (Patient taking differently: TAKE 1 TABLET BY MOUTH EVERY DAY PRN)  . metFORMIN (GLUCOPHAGE) 500 MG tablet Take 1 tablet (500 mg total) by mouth 2 (two) times daily.  . nadolol (CORGARD) 20 MG tablet Take 1 tablet (20 mg total) by mouth daily.  . naproxen sodium (ALEVE) 220 MG tablet Take 220 mg by mouth. PRN occasionally  . Olmesartan-amLODIPine-HCTZ 20-5-12.5 MG TABS Take 1 tablet by mouth daily.  Marland Kitchen triamcinolone cream (KENALOG) 0.1 % Reported on 07/15/2015   No facility-administered encounter medications on file as of 10/28/2019.    Allergies (verified) Duloxetine   History: Past Medical History:  Diagnosis Date  . Allergy   . Depression   . GERD (gastroesophageal reflux disease)   . Hyperlipidemia   . Hypertension   . Internal hemorrhoids   . Menopausal disorder   . Osteoarthritis    hands  . Prediabetes   . Wears dentures    partial upper  Past Surgical History:  Procedure Laterality Date  . ABDOMINAL HYSTERECTOMY    . CATARACT EXTRACTION, BILATERAL    . CESAREAN SECTION    . COLONOSCOPY WITH PROPOFOL N/A 03/15/2016   Procedure: COLONOSCOPY WITH PROPOFOL;  Surgeon: Kieth BrightlySeeplaputhur G Sankar, MD;  Location: [facility];  Service: Endoscopy;  Laterality: N/A;  . HALLUX VALGUS LAPIDUS Left 06/16/2015   Procedure: HALLUX VALGUS LAPIDUS;  Surgeon: Gwyneth RevelsJustin Fowler, DPM;   [facility];  Service: Podiatry;  Laterality: Left;  WITH POPLITEAL  . HAMMER TOE SURGERY Left 06/16/2015   Procedure: HAMMER TOE CORRECTION SECOND TOE LEFT;  Surgeon: Gwyneth RevelsJustin Fowler, DPM;  [facility];  Service: Podiatry;  Laterality: Left;  prediabetic - on oral meds  . hip replacemanet Right 03/15/2018   Alcus DadJames Bowers-Emergeortho  . MYOMECTOMY    . OOPHORECTOMY    . trigger finger Right 12/15/2014  . Vulva cyst  11/2006   Family History  Problem Relation Age of Onset  . Dementia Mother   . Diabetes Father   . Stroke Father   . Cancer Daughter        Fibroid Tumors  . Diabetes Brother   . Breast cancer Neg Hx    Social History   Socioeconomic History  . Marital status: Married    Spouse name: Adela GlimpseBernard  . Number of children: 1  . Years of education: Not on file  . Highest education level: Bachelor's degree (e.g., BA, AB, BS)  Occupational History  . Occupation: retired Runner, broadcasting/film/videoteacher    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  Tobacco Use  . Smoking status: Never Smoker  . Smokeless tobacco: Never Used  . Tobacco comment: smoking cessation materials not required  Vaping Use  . Vaping Use: Never used  Substance and Sexual Activity  . Alcohol use: No    Alcohol/week: 0.0 standard drinks  . Drug use: No  . Sexual activity: Not Currently    Partners: Male  Other Topics Concern  . Not on file  Social History Narrative   Married to a Education officer, environmentalpastor and he is a professor at [employer] forces training and education officer( Sociology )    Social Determinants of Corporate investment bankerHealth   Financial Resource Strain: Low Risk   . Difficulty of Paying Living Expenses: Not hard at all  Food Insecurity: No Food Insecurity  . Worried About Programme researcher, broadcasting/film/videounning Out of Food in the Last Year: Never true  . Ran Out of Food in the Last Year: Never true  Transportation Needs: No Transportation Needs  . Lack of Transportation (Medical): No  . Lack of Transportation (Non-Medical): No  Physical Activity: Insufficiently Active  . Days of Exercise per  Week: 3 days  . Minutes of Exercise per Session: 30 min  Stress: Stress Concern Present  . Feeling of Stress : To some extent  Social Connections: Moderately Integrated  . Frequency of Communication with Friends and Family: More than three times a week  . Frequency of Social Gatherings with Friends and Family: Three times a week  . Attends Religious Services: More than 4 times per year  . Active Member of Clubs or Organizations: No  . Attends BankerClub or Organization Meetings: Never  . Marital Status: Married    Tobacco Counseling Counseling given: Not Answered Comment: smoking cessation materials not required   Clinical Intake:  Pre-visit preparation completed: Yes  Pain : No/denies pain     Nutritional Risks: None Diabetes: No  How often do you need to have someone help you when you read instructions, pamphlets, or other  written materials from your doctor or pharmacy?: 1 - Never    Interpreter Needed?: No  Information entered by :: Reather Littler LPN   Activities of Daily Living In your present state of health, do you have any difficulty performing the following activities: 10/28/2019 07/01/2019  Hearing? N N  Comment declines hearing aids -  Vision? N N  Difficulty concentrating or making decisions? N N  Walking or climbing stairs? N N  Dressing or bathing? N N  Doing errands, shopping? N N  Preparing Food and eating ? N -  Using the Toilet? N -  In the past six months, have you accidently leaked urine? N -  Do you have problems with loss of bowel control? N -  Managing your Medications? N -  Managing your Finances? N -  Housekeeping or managing your Housekeeping? N -  Some recent data might be hidden    Patient Care Team: Alba Cory, MD as PCP - General (Family Medicine) Kieth Brightly, MD (General Surgery) Deeann Saint, MD as Consulting Physician (Orthopedic Surgery) Alwyn Pea, MD as Consulting Physician (Cardiology)  Indicate any  recent Medical Services you may have received from other than [facility] providers in the past year (date may be approximate).     Assessment:   This is a routine wellness examination for [place].  Hearing/Vision screen  Hearing Screening   125Hz  250Hz  500Hz  1000Hz  2000Hz  3000Hz  4000Hz  6000Hz  8000Hz   Right ear:           Left ear:           Comments: Pt denies hearing difficulty   Vision Screening Comments: [facility] vision screenings at [facility]  Dietary issues and exercise activities discussed: Current Exercise Habits: Home exercise routine, Type of exercise: calisthenics;stretching, Time (Minutes): 30, Frequency (Times/Week): 3, Weekly Exercise (Minutes/Week): 90, Intensity: Mild, Exercise limited by: orthopedic condition(s)  Goals    . DIET     Recommend to decrease portion sizes by eating 3 small healthy meals and at least 2 healthy snacks per day.    DIET - INCREASE WATER INTAKE     Recommend to drink at least 6-8 8oz glasses of water per day.    . Weight (lb) < 170 lb (77.1 kg)     Pt would like to lose weight over the next year with weight watchers and physical activity.      Depression Screen PHQ 2/9 Scores 10/28/2019 07/01/2019 02/24/2019 02/24/2019 02/19/2019 11/11/2018 10/22/2018  PHQ - 2 Score 0 1 1 0 1 1 2   PHQ- 9 Score - 3 2 0 3 3 3     Fall Risk Fall Risk  10/28/2019 07/01/2019 02/24/2019 02/19/2019 10/22/2018  Falls in the past year? 0 0 0 0 0  Comment - - - - -  Number falls in past yr: 0 0 0 0 0  Injury with Fall? 0 0 0 0 0  Comment - - - - -  Risk Factor Category  - - - - -  Risk for fall due to : Orthopedic patient - - - -  Risk for fall due to: Comment - - - - -  Follow up Falls prevention discussed Falls evaluation completed - - Falls prevention discussed    Any stairs in or around the home? Yes  If so, are there any without handrails? Yes  Home free of loose throw rugs in walkways, pet beds, electrical cords, etc? Yes  Adequate lighting in your home to  reduce  risk of falls? Yes   ASSISTIVE DEVICES UTILIZED TO PREVENT FALLS:  Life alert? No  Use of a cane, walker or w/c? No  Grab bars in the bathroom? Yes  Shower chair or bench in shower? Yes  Elevated toilet seat or a handicapped toilet? No   TIMED UP AND GO:  Was the test performed? No . Telephonic visit.   Cognitive Function:     6CIT Screen 10/28/2019 10/22/2018 10/12/2017  What Year? 0 points 0 points 0 points  What month? 0 points 0 points 0 points  What time? 0 points 0 points 0 points  Count back from 20 0 points 0 points 0 points  Months in reverse 0 points 0 points 0 points  Repeat phrase 0 points 2 points 0 points  Total Score 0 2 0    Immunizations Immunization History  Administered Date(s) Administered  . Fluad Quad(high Dose 65+) 02/19/2019  . Influenza, High Dose Seasonal PF 01/18/2017, 01/14/2018  . Influenza,inj,Quad PF,6+ Mos 01/11/2015, 01/17/2016  . Influenza-Unspecified 01/06/2014  . PFIZER SARS-COV-2 Vaccination 06/23/2019, 07/14/2019  . Pneumococcal Conjugate-13 07/23/2017  . Pneumococcal Polysaccharide-23 08/31/2009  . Tdap 05/10/2010, 07/23/2017  . Zoster 12/28/2011    TDAP status: Up to date   Flu Vaccine status: Up to date   Pneumococcal vaccine status: Up to date   Covid-19 vaccine status: Completed vaccines  Qualifies for Shingles Vaccine? Yes   Zostavax completed Yes   Shingrix Completed?: No.    Education has been provided regarding the importance of this vaccine. Patient has been advised to call insurance company to determine out of pocket expense if they have not yet received this vaccine. Advised may also receive vaccine at local pharmacy or Health Dept. Verbalized acceptance and understanding.  Screening Tests Health Maintenance  Topic Date Due  . INFLUENZA VACCINE  11/30/2019  . MAMMOGRAM  07/06/2020  . COLONOSCOPY  03/15/2026  . TETANUS/TDAP  07/24/2027  . DEXA SCAN  Completed  . COVID-19 Vaccine  Completed  . Hepatitis  C Screening  Completed  . PNA vac Low Risk Adult  Completed    Health Maintenance  There are no preventive care reminders to display for this patient.  Colorectal cancer screening: Completed 03/15/16. Repeat every 10 years   Mammogram status: Completed 07/07/19. Repeat every year   Bone Density status: Completed 09/04/16. Results reflect: Bone density results: OSTEOPENIA. Repeat every 2 years. Pt would like to discuss with Dr. Carlynn Purl at next visit due to hip replacement.   Lung Cancer Screening: (Low Dose CT Chest recommended if Age 20-80 years, 30 pack-year currently smoking OR have quit w/in 15years.) does not qualify.     Additional Screening:  Hepatitis C Screening: does qualify; Completed 12/28/11  Vision Screening: Recommended annual ophthalmology exams for early detection of glaucoma and other disorders of the eye. Is the patient up to date with their annual eye exam?  Yes  Who is the provider or what is the name of the office in which the patient attends annual eye exams? [facility]  Dental Screening: Recommended annual dental exams for proper oral hygiene  Community Resource Referral / Chronic Care Management: CRR required this visit?  No   CCM required this visit?  No      Plan:     I have personally reviewed and noted the following in the patient's chart:   . Medical and social history . Use of alcohol, tobacco or illicit drugs  . Current medications and supplements . Functional  ability and status . Nutritional status . Physical activity . Advanced directives . List of other physicians . Hospitalizations, surgeries, and ER visits in previous 12 months . Vitals . Screenings to include cognitive, depression, and falls . Referrals and appointments  In addition, I have reviewed and discussed with patient certain preventive protocols, quality metrics, and best practice recommendations. A written personalized care plan for preventive services as well as  general preventive health recommendations were provided to patient.     Reather Littler, LPN   [id number]   Nurse Notes: pt doing well and appreciative of visit today

## 2019-10-28 NOTE — Patient Instructions (Signed)
Monique Baker , Thank you for taking time to come for your Medicare Wellness Visit. I appreciate your ongoing commitment to your health goals. Please review the following plan we discussed and let me know if I can assist you in the future.   Screening recommendations/referrals: Colonoscopy: done 03/15/16. Repeat in 2027. Mammogram: done 07/07/19 Bone Density: done 09/04/16 Recommended yearly ophthalmology/optometry visit for glaucoma screening and checkup Recommended yearly dental visit for hygiene and checkup  Vaccinations: Influenza vaccine: done 02/19/19 Pneumococcal vaccine: done 07/23/17 Tdap vaccine: done 07/23/17 Shingles vaccine: Shingrix discussed. Please contact your pharmacy for coverage information.  Covid-19: done 06/23/19 & 07/14/19  Advanced directives: Advance directive discussed with you today. I have provided a copy for you to complete at home and have notarized. Once this is complete please bring a copy in to our office so we can scan it into your chart.  Conditions/risks identified: Recommend healthy eating and physical activity for desired weight loss.   Next appointment: Follow up in one year for your annual wellness visit    Preventive Care 65 Years and Older, Female Preventive care refers to lifestyle choices and visits with your health care provider that can promote health and wellness. What does preventive care include?  A yearly physical exam. This is also called an annual well check.  Dental exams once or twice a year.  Routine eye exams. Ask your health care provider how often you should have your eyes checked.  Personal lifestyle choices, including:  Daily care of your teeth and gums.  Regular physical activity.  Eating a healthy diet.  Avoiding tobacco and drug use.  Limiting alcohol use.  Practicing safe sex.  Taking low-dose aspirin every day.  Taking vitamin and mineral supplements as recommended by your health care provider. What happens  during an annual well check? The services and screenings done by your health care provider during your annual well check will depend on your age, overall health, lifestyle risk factors, and family history of disease. Counseling  Your health care provider may ask you questions about your:  Alcohol use.  Tobacco use.  Drug use.  Emotional well-being.  Home and relationship well-being.  Sexual activity.  Eating habits.  History of falls.  Memory and ability to understand (cognition).  Work and work Astronomer.  Reproductive health. Screening  You may have the following tests or measurements:  Height, weight, and BMI.  Blood pressure.  Lipid and cholesterol levels. These may be checked every 5 years, or more frequently if you are over 55 years old.  Skin check.  Lung cancer screening. You may have this screening every year starting at age 73 if you have a 30-pack-year history of smoking and currently smoke or have quit within the past 15 years.  Fecal occult blood test (FOBT) of the stool. You may have this test every year starting at age 60.  Flexible sigmoidoscopy or colonoscopy. You may have a sigmoidoscopy every 5 years or a colonoscopy every 10 years starting at age 28.  Hepatitis C blood test.  Hepatitis B blood test.  Sexually transmitted disease (STD) testing.  Diabetes screening. This is done by checking your blood sugar (glucose) after you have not eaten for a while (fasting). You may have this done every 1-3 years.  Bone density scan. This is done to screen for osteoporosis. You may have this done starting at age 59.  Mammogram. This may be done every 1-2 years. Talk to your health care provider about how often  you should have regular mammograms. Talk with your health care provider about your test results, treatment options, and if necessary, the need for more tests. Vaccines  Your health care provider may recommend certain vaccines, such  as:  Influenza vaccine. This is recommended every year.  Tetanus, diphtheria, and acellular pertussis (Tdap, Td) vaccine. You may need a Td booster every 10 years.  Zoster vaccine. You may need this after age 34.  Pneumococcal 13-valent conjugate (PCV13) vaccine. One dose is recommended after age 15.  Pneumococcal polysaccharide (PPSV23) vaccine. One dose is recommended after age 32. Talk to your health care provider about which screenings and vaccines you need and how often you need them. This information is not intended to replace advice given to you by your health care provider. Make sure you discuss any questions you have with your health care provider. Document Released: 05/14/2015 Document Revised: 01/05/2016 Document Reviewed: 02/16/2015 Elsevier Interactive Patient Education  2017 [employer].  Fall Prevention in the Home Falls can cause injuries. They can happen to people of all ages. There are many things you can do to make your home safe and to help prevent falls. What can I do on the outside of my home?  Regularly fix the edges of walkways and driveways and fix any cracks.  Remove anything that might make you trip as you walk through a door, such as a raised step or threshold.  Trim any bushes or trees on the path to your home.  Use bright outdoor lighting.  Clear any walking paths of anything that might make someone trip, such as rocks or tools.  Regularly check to see if handrails are loose or broken. Make sure that both sides of any steps have handrails.  Any raised decks and porches should have guardrails on the edges.  Have any leaves, snow, or ice cleared regularly.  Use sand or salt on walking paths during winter.  Clean up any spills in your garage right away. This includes oil or grease spills. What can I do in the bathroom?  Use night lights.  Install grab bars by the toilet and in the tub and shower. Do not use towel bars as grab bars.  Use  non-skid mats or decals in the tub or shower.  If you need to sit down in the shower, use a plastic, non-slip stool.  Keep the floor dry. Clean up any water that spills on the floor as soon as it happens.  Remove soap buildup in the tub or shower regularly.  Attach bath mats securely with double-sided non-slip rug tape.  Do not have throw rugs and other things on the floor that can make you trip. What can I do in the bedroom?  Use night lights.  Make sure that you have a light by your bed that is easy to reach.  Do not use any sheets or blankets that are too big for your bed. They should not hang down onto the floor.  Have a firm chair that has side arms. You can use this for support while you get dressed.  Do not have throw rugs and other things on the floor that can make you trip. What can I do in the kitchen?  Clean up any spills right away.  Avoid walking on wet floors.  Keep items that you use a lot in easy-to-reach places.  If you need to reach something above you, use a strong step stool that has a grab bar.  Keep electrical cords  out of the way.  Do not use floor polish or wax that makes floors slippery. If you must use wax, use non-skid floor wax.  Do not have throw rugs and other things on the floor that can make you trip. What can I do with my stairs?  Do not leave any items on the stairs.  Make sure that there are handrails on both sides of the stairs and use them. Fix handrails that are broken or loose. Make sure that handrails are as long as the stairways.  Check any carpeting to make sure that it is firmly attached to the stairs. Fix any carpet that is loose or worn.  Avoid having throw rugs at the top or bottom of the stairs. If you do have throw rugs, attach them to the floor with carpet tape.  Make sure that you have a light switch at the top of the stairs and the bottom of the stairs. If you do not have them, ask someone to add them for you. What  else can I do to help prevent falls?  Wear shoes that:  Do not have high heels.  Have rubber bottoms.  Are comfortable and fit you well.  Are closed at the toe. Do not wear sandals.  If you use a stepladder:  Make sure that it is fully opened. Do not climb a closed stepladder.  Make sure that both sides of the stepladder are locked into place.  Ask someone to hold it for you, if possible.  Clearly mark and make sure that you can see:  Any grab bars or handrails.  First and last steps.  Where the edge of each step is.  Use tools that help you move around (mobility aids) if they are needed. These include:  Canes.  Walkers.  Scooters.  Crutches.  Turn on the lights when you go into a dark area. Replace any light bulbs as soon as they burn out.  Set up your furniture so you have a clear path. Avoid moving your furniture around.  If any of your floors are uneven, fix them.  If there are any pets around you, be aware of where they are.  Review your medicines with your doctor. Some medicines can make you feel dizzy. This can increase your chance of falling. Ask your doctor what other things that you can do to help prevent falls. This information is not intended to replace advice given to you by your health care provider. Make sure you discuss any questions you have with your health care provider. Document Released: 02/11/2009 Document Revised: 09/23/2015 Document Reviewed: 05/22/2014 Elsevier Interactive Patient Education  2017 [employer].

## 2019-11-04 ENCOUNTER — Ambulatory Visit: Payer: Medicare PPO | Admitting: Family Medicine

## 2019-11-04 ENCOUNTER — Encounter: Payer: Self-pay | Admitting: Family Medicine

## 2019-11-04 ENCOUNTER — Other Ambulatory Visit: Payer: Self-pay

## 2019-11-04 VITALS — BP 130/76 | HR 71 | Temp 97.3°F | Resp 16 | Ht 62.0 in | Wt 197.2 lb

## 2019-11-04 DIAGNOSIS — E785 Hyperlipidemia, unspecified: Secondary | ICD-10-CM | POA: Diagnosis not present

## 2019-11-04 DIAGNOSIS — R739 Hyperglycemia, unspecified: Secondary | ICD-10-CM | POA: Diagnosis not present

## 2019-11-04 DIAGNOSIS — J302 Other seasonal allergic rhinitis: Secondary | ICD-10-CM

## 2019-11-04 DIAGNOSIS — F330 Major depressive disorder, recurrent, mild: Secondary | ICD-10-CM

## 2019-11-04 DIAGNOSIS — F411 Generalized anxiety disorder: Secondary | ICD-10-CM | POA: Diagnosis not present

## 2019-11-04 DIAGNOSIS — J3089 Other allergic rhinitis: Secondary | ICD-10-CM | POA: Diagnosis not present

## 2019-11-04 DIAGNOSIS — I10 Essential (primary) hypertension: Secondary | ICD-10-CM | POA: Diagnosis not present

## 2019-11-04 DIAGNOSIS — E538 Deficiency of other specified B group vitamins: Secondary | ICD-10-CM | POA: Diagnosis not present

## 2019-11-04 DIAGNOSIS — E559 Vitamin D deficiency, unspecified: Secondary | ICD-10-CM | POA: Diagnosis not present

## 2019-11-04 MED ORDER — NADOLOL 20 MG PO TABS: 20.0000 mg | ORAL_TABLET | Freq: Every day | ORAL | 1 refills | Status: DC

## 2019-11-04 MED ORDER — CITALOPRAM HYDROBROMIDE 20 MG PO TABS: 20.0000 mg | ORAL_TABLET | Freq: Every day | ORAL | 0 refills | Status: DC

## 2019-11-04 NOTE — Progress Notes (Signed)
Name: Monique Baker   MRN: [id number]    DOB: 1951-01-02   Date:11/04/2019       Progress Note  Subjective  Chief Complaint  Chief Complaint  Patient presents with   Hypertension   Depression   Prediabetes    HPI  Pre diabetes/metabolic syndrome: she is taking Metformin, denies side effects, hgbA1C was 5.0%,it was 5.9% two years ago, she deniespolyphagia, polydipsia or polyuria.She stopped Weight Watchers but is back again, she states not very consistent with her diet   Right hip pain/OA:she had right hip replacement back in Nov 2019 by Dr. Vena Rua is now pain free, no longer using a cane, on yearly follow ups now, unchanged   Morbid obesity:  she joined weight watchers 05/2016 at a weight of 224 lbs Weight has been stable for the  past couple of years.  Following Weight Watchers loosely lately, having difficulty planning her meals. Still below 200 lbs   HTN: taking bp medication, denies side effects, no chest painBP is at goal, she is compliant with medication. She has palpitation when she drinks caffeine  Depression Major: she is married to a Education officer, environmental, he also works as a Airline pilot at [employer]. We started her on Cymbalta 07/2017was doing better but caused hair loss, so she is now on Citalopramand  Wellbutrin XL, she is feeling down since her brother died, shortly after his 110's birthday, he had back surgery and died at the rehab facility . It was her only brother , discussed hospice counseling.     Patient Active Problem List   Diagnosis Date Noted   Osteoarthritis of hip 02/19/2018   Osteopenia of hip 09/04/2016   History of bunionectomy of left great toe 07/15/2015   H/O hammer toe correction 03/15/2015   Arthritis of lumbar spine 01/19/2015   Benign essential HTN 11/05/2014   Carpal tunnel syndrome 11/05/2014   Osteoarthritis 11/05/2014   Dermatitis, eczematoid 11/05/2014   Depression, major, recurrent, mild (HCC) 11/05/2014    Dyslipidemia 11/05/2014   Gastro-esophageal reflux disease without esophagitis 11/05/2014   Morbid obesity due to excess calories (HCC) 11/05/2014   Dysmetabolic syndrome 11/05/2014   Menopausal and perimenopausal disorder 11/05/2014   Perennial allergic rhinitis with seasonal variation 11/05/2014   Seborrhea capitis 11/05/2014   Acquired trigger finger 11/05/2014   Urinary incontinence in female 11/05/2014   Vitamin D deficiency 11/05/2014   Blood glucose elevated 08/31/2009    Past Surgical History:  Procedure Laterality Date   ABDOMINAL HYSTERECTOMY     CATARACT EXTRACTION, BILATERAL     CESAREAN SECTION     COLONOSCOPY WITH PROPOFOL N/A 03/15/2016   Procedure: COLONOSCOPY WITH PROPOFOL;  Surgeon: Kieth Brightly, MD;  Location: [facility] END[facility]COPY;  Service: Endoscopy;  Laterality: N/A;   HALLUX VALGUS LAPIDUS Left 06/16/2015   Procedure: HALLUX VALGUS LAPIDUS;  Surgeon: Gwyneth Revels, DPM;  Location: [facility];  Service: Podiatry;  Laterality: Left;  WITH POPLITEAL   HAMMER TOE SURGERY Left 06/16/2015   Procedure: HAMMER TOE CORRECTION SECOND TOE LEFT;  Surgeon: Gwyneth Revels, DPM;  Location: [facility];  Service: Podiatry;  Laterality: Left;  prediabetic - on oral meds   hip replacemanet Right 03/15/2018   Fayrene Fearing [facility]o   MYOMECTOMY     OOPHORECTOMY     trigger finger Right 12/15/2014   Vulva cyst  11/2006    Family History  Problem Relation Age of Onset   Dementia Mother    Diabetes Father    Stroke Father  Cancer Daughter        Fibroid Tumors   Diabetes Brother    Breast cancer Neg Hx     Social History   Tobacco Use   Smoking status: Never Smoker   Smokeless tobacco: Never Used   Tobacco comment: smoking cessation materials not required  Substance Use Topics   Alcohol use: No    Alcohol/week: 0.0 standard drinks     Current Outpatient Medications:    acetaminophen (TYLENOL) 500 MG tablet,  Take by mouth. Reported on 11/15/2015, Disp: , Rfl:    aspirin EC 81 MG tablet, Take 81 mg by mouth daily., Disp: , Rfl:    atorvastatin (LIPITOR) 40 MG tablet, Take 1 tablet (40 mg total) by mouth every evening. for cholesterol, Disp: 90 tablet, Rfl: 1   buPROPion (WELLBUTRIN XL) 150 MG 24 hr tablet, Take 1 tablet (150 mg total) by mouth daily before breakfast., Disp: 90 tablet, Rfl: 1   Cholecalciferol (VITAMIN D PO), Take 1 capsule by mouth daily. 3000 IU daily, Disp: , Rfl:    citalopram (CELEXA) 20 MG tablet, Take 1 tablet (20 mg total) by mouth daily., Disp: 90 tablet, Rfl: 0   Cyanocobalamin (B-12) 1000 MCG SUBL, Place 1 each under the tongue 2 (two) times a week. , Disp: , Rfl:    docusate sodium (COLACE) 100 MG capsule, Take by mouth daily. PRN only, Disp: , Rfl:    fluticasone (FLONASE) 50 MCG/ACT nasal spray, SPRAY 2 SPRAYS INTO EACH NOSTRIL EVERY DAY, Disp: 48 mL, Rfl: 1   glucose blood (ONETOUCH VERIO) test strip, Use as instructed, Disp: 100 each, Rfl: 12   loratadine (CLARITIN) 10 MG tablet, TAKE 1 TABLET BY MOUTH EVERY DAY (Patient taking differently: TAKE 1 TABLET BY MOUTH EVERY DAY PRN), Disp: 90 tablet, Rfl: 1   metFORMIN (GLUCOPHAGE) 500 MG tablet, Take 1 tablet (500 mg total) by mouth 2 (two) times daily., Disp: 180 tablet, Rfl: 1   nadolol (CORGARD) 20 MG tablet, Take 1 tablet (20 mg total) by mouth daily., Disp: 90 tablet, Rfl: 1   naproxen sodium (ALEVE) 220 MG tablet, Take 220 mg by mouth. PRN occasionally, Disp: , Rfl:    Olmesartan-amLODIPine-HCTZ 20-5-12.5 MG TABS, Take 1 tablet by mouth daily., Disp: 90 tablet, Rfl: 1   triamcinolone cream (KENALOG) 0.1 %, Reported on 07/15/2015, Disp: , Rfl:   Allergies  Allergen Reactions   Duloxetine     sweat    I personally reviewed active problem list, medication list, allergies, family history, social history with the patient/caregiver today.   ROS  Constitutional: Negative for fever or weight change.   Respiratory: Negative for cough and shortness of breath.   Cardiovascular: Negative for chest pain or palpitations.  Gastrointestinal: Negative for abdominal pain, no bowel changes.  Musculoskeletal: Negative for gait problem or joint swelling.  Skin: Negative for rash.  Neurological: Negative for dizziness or headache.  No other specific complaints in a complete review of systems (except as listed in HPI above).  Objective  Vitals:   11/04/19 1023  BP: 130/76  Pulse: 71  Resp: 16  Temp: (!) 97.3 F (36.3 C)  TempSrc: Temporal  SpO2: 96%  Weight: 197 lb 3.2 oz (89.4 kg)  Height: 5\' 2"  (1.575 m)    Body mass index is 36.07 kg/m.  Physical Exam  Constitutional: Patient appears well-developed and well-nourished. Obese  No distress.  HEENT: head atraumatic, normocephalic, pupils equal and reactive to light,  neck supple Cardiovascular: Normal rate, regular  rhythm and normal heart sounds.  No murmur heard. No BLE edema. Pulmonary/Chest: Effort normal and breath sounds normal. No respiratory distress. Abdominal: Soft.  There is no tenderness. Psychiatric: Patient has a normal mood and affect. behavior is normal. Judgment and thought content normal.  PHQ2/9: Depression screen [facility] 2/9 11/04/2019 10/28/2019 07/01/2019 02/24/2019 02/24/2019  Decreased Interest 1 0 1 0 0  Down, Depressed, Hopeless 3 0 0 1 0  PHQ - 2 Score 4 0 1 1 0  Altered sleeping 2 - 0 0 0  Tired, decreased energy 1 - 1 1 0  Change in appetite 0 - 1 0 0  Feeling bad or failure about yourself  0 - 0 0 0  Trouble concentrating 0 - 0 0 0  Moving slowly or fidgety/restless 0 - 0 0 0  Suicidal thoughts 0 - 0 0 0  PHQ-9 Score 7 - 3 2 0  Difficult doing work/chores Not difficult at all - Not difficult at all Not difficult at all -  Some recent data might be hidden    phq 9 is positive   Fall Risk: Fall Risk  11/04/2019 10/28/2019 07/01/2019 02/24/2019 02/19/2019  Falls in the past year? 0 0 0 0 0  Comment - - - - -   Number falls in past yr: 0 0 0 0 0  Injury with Fall? 0 0 0 0 0  Comment - - - - -  Risk Factor Category  - - - - -  Risk for fall due to : - Orthopedic patient - - -  Risk for fall due to: Comment - - - - -  Follow up - Falls prevention discussed Falls evaluation completed - -     Assessment & Plan  1. Benign essential HTN  - COMPLETE METABOLIC PANEL WITH GFR - CBC with Differential/Platelet - nadolol (CORGARD) 20 MG tablet; Take 1 tablet (20 mg total) by mouth daily.  Dispense: 90 tablet; Refill: 1  2. Dyslipidemia  - Lipid panel  3. Depression, major, recurrent, mild (HCC)  - citalopram (CELEXA) 20 MG tablet; Take 1 tablet (20 mg total) by mouth daily.  Dispense: 90 tablet; Refill: 0  4. Hyperglycemia  - Hemoglobin A1c  5. B12 deficiency  - Vitamin B12  6. Vitamin D deficiency  - VITAMIN D 25 Hydroxy (Vit-D Deficiency, Fractures)  7. Perennial allergic rhinitis with seasonal variation   8. GAD (generalized anxiety disorder)  - citalopram (CELEXA) 20 MG tablet; Take 1 tablet (20 mg total) by mouth daily.  Dispense: 90 tablet; Refill: 0

## 2019-11-05 LAB — LIPID PANEL
Cholesterol: 132 mg/dL (ref <200)
HDL: 60 mg/dL (ref >=50)
LDL Cholesterol (Calc): 56 mg/dL (calc)
Non-HDL Cholesterol (Calc): 72 mg/dL (calc) (ref <130)
Total CHOL/HDL Ratio: 2.2 (calc) (ref <5.0)
Triglycerides: 80 mg/dL (ref <150)

## 2019-11-05 LAB — COMPLETE METABOLIC PANEL WITH GFR
AG Ratio: 1.6 (calc) (ref 1.0–2.5)
ALT: 13 U/L (ref 6–29)
AST: 15 U/L (ref 10–35)
Albumin: 4.3 g/dL (ref 3.6–5.1)
Alkaline phosphatase (APISO): 62 U/L (ref 37–153)
BUN: 18 mg/dL (ref 7–25)
CO2: 31 mmol/L (ref 20–32)
Calcium: 10 mg/dL (ref 8.6–10.4)
Chloride: 102 mmol/L (ref 98–110)
Creat: 0.77 mg/dL (ref 0.50–0.99)
GFR, Est African American: 92 mL/min/{1.73_m2} (ref >=60)
GFR, Est Non African American: 79 mL/min/{1.73_m2} (ref >=60)
Globulin: 2.7 g/dL (calc) (ref 1.9–3.7)
Glucose, Bld: 84 mg/dL (ref 65–99)
Potassium: 4.2 mmol/L (ref 3.5–5.3)
Sodium: 141 mmol/L (ref 135–146)
Total Bilirubin: 0.9 mg/dL (ref 0.2–1.2)
Total Protein: 7 g/dL (ref 6.1–8.1)

## 2019-11-05 LAB — CBC WITH DIFFERENTIAL/PLATELET
Absolute Monocytes: 693 cells/uL (ref 200–950)
Basophils Absolute: 39 cells/uL (ref 0–200)
Basophils Relative: 0.5 %
Eosinophils Absolute: 177 cells/uL (ref 15–500)
Eosinophils Relative: 2.3 %
HCT: 39.6 % (ref 35.0–45.0)
Hemoglobin: 13.2 g/dL (ref 11.7–15.5)
Lymphs Abs: 1409 cells/uL (ref 850–3900)
MCH: 28.2 pg (ref 27.0–33.0)
MCHC: 33.3 g/dL (ref 32.0–36.0)
MCV: 84.6 fL (ref 80.0–100.0)
MPV: 9.9 fL (ref 7.5–12.5)
Monocytes Relative: 9 %
Neutro Abs: 5382 cells/uL (ref 1500–7800)
Neutrophils Relative %: 69.9 %
Platelets: 352 10*3/uL (ref 140–400)
RBC: 4.68 10*6/uL (ref 3.80–5.10)
RDW: 13.2 % (ref 11.0–15.0)
Total Lymphocyte: 18.3 %
WBC: 7.7 10*3/uL (ref 3.8–10.8)

## 2019-11-05 LAB — VITAMIN B12: Vitamin B-12: 612 pg/mL (ref 200–1100)

## 2019-11-05 LAB — VITAMIN D 25 HYDROXY (VIT D DEFICIENCY, FRACTURES): Vit D, 25-Hydroxy: 71 ng/mL (ref 30–100)

## 2019-11-05 LAB — HEMOGLOBIN A1C
Hgb A1c MFr Bld: 5.6 % of total Hgb (ref <5.7)
Mean Plasma Glucose: 114 (calc)
eAG (mmol/L): 6.3 (calc)

## 2020-01-20 DIAGNOSIS — H524 Presbyopia: Secondary | ICD-10-CM | POA: Diagnosis not present

## 2020-01-20 DIAGNOSIS — D3131 Benign neoplasm of right choroid: Secondary | ICD-10-CM | POA: Diagnosis not present

## 2020-01-20 DIAGNOSIS — H52223 Regular astigmatism, bilateral: Secondary | ICD-10-CM | POA: Diagnosis not present

## 2020-01-20 DIAGNOSIS — H5203 Hypermetropia, bilateral: Secondary | ICD-10-CM | POA: Diagnosis not present

## 2020-01-30 ENCOUNTER — Other Ambulatory Visit: Payer: Self-pay | Admitting: Family Medicine

## 2020-01-30 DIAGNOSIS — R739 Hyperglycemia, unspecified: Secondary | ICD-10-CM

## 2020-01-30 DIAGNOSIS — E8881 Metabolic syndrome: Secondary | ICD-10-CM

## 2020-02-25 ENCOUNTER — Other Ambulatory Visit: Payer: Self-pay | Admitting: Family Medicine

## 2020-02-25 DIAGNOSIS — I10 Essential (primary) hypertension: Secondary | ICD-10-CM

## 2020-03-05 ENCOUNTER — Ambulatory Visit: Payer: Medicare PPO | Admitting: Family Medicine

## 2020-03-09 ENCOUNTER — Encounter: Payer: Medicare PPO | Admitting: Family Medicine

## 2020-03-12 NOTE — Progress Notes (Signed)
Name: Monique Baker   MRN: [id number]    DOB: 1950/11/22   Date:03/15/2020       Progress Note  Subjective  Chief Complaint  Follow up   HPI   Pre diabetes/metabolic syndrome: she is taking Metformin, denies side effects, last  hgbA1C was 5.6%,it She deniespolyphagia, polydipsia or polyuria.She is following Weight [employer] again and going back to in person meetings.   Right hip pain/OA:she had right hip replacement back in Nov 2019 by Dr. Vena RuaBowersShe is now pain free, no longer using a cane, on yearly follow ups now. Doing well   Morbid obesity:  she joined weight watchers 05/2016 at a weight of 224 lbs Weight has been stable for the past couple of years, she is frustrated because her weight is almost back to 200 lbs. She signed up to go back for in person weight watchers again to help her lose some more weight.    HTN: taking bp medication, denies side effects, no chest pain, palpitation or dizziness.. BP is at goal, she is compliant with medication.   Depression Major: she is married to a Education officer, environmentalpastor, he also works as a Airline pilotprofessor at [employer]. We started her on Cymbalta 07/2017was doing better but caused hair loss, so she is now on Citalopramand Wellbutrin XL. She states she still misses her brother - he died in 2021 but is doing much better   Vitamin D and B12: taking supplementation daily   Patient Active Problem List   Diagnosis Date Noted  . Osteoarthritis of hip 02/19/2018  . Osteopenia of hip 09/04/2016  . History of bunionectomy of left great toe 07/15/2015  . H/O hammer toe correction 03/15/2015  . Arthritis of lumbar spine 01/19/2015  . Benign essential HTN 11/05/2014  . Carpal tunnel syndrome 11/05/2014  . Osteoarthritis 11/05/2014  . Dermatitis, eczematoid 11/05/2014  . Depression, major, recurrent, mild (HCC) 11/05/2014  . Dyslipidemia 11/05/2014  . Gastro-esophageal reflux disease without esophagitis 11/05/2014  . Morbid obesity (HCC) 11/05/2014  .  Dysmetabolic syndrome 11/05/2014  . Menopausal and perimenopausal disorder 11/05/2014  . Perennial allergic rhinitis with seasonal variation 11/05/2014  . Seborrhea capitis 11/05/2014  . Acquired trigger finger 11/05/2014  . Urinary incontinence in female 11/05/2014  . Vitamin D deficiency 11/05/2014  . Blood glucose elevated 08/31/2009    Past Surgical History:  Procedure Laterality Date  . ABDOMINAL HYSTERECTOMY    . CATARACT EXTRACTION, BILATERAL    . CESAREAN SECTION    . COLONOSCOPY WITH PROPOFOL N/A 03/15/2016   Procedure: COLONOSCOPY WITH PROPOFOL;  Surgeon: Kieth BrightlySeeplaputhur G Sankar, MD;  Location: [facility] END[facility];  Service: Endoscopy;  Laterality: N/A;  . HALLUX VALGUS LAPIDUS Left 06/16/2015   Procedure: HALLUX VALGUS LAPIDUS;  Surgeon: Gwyneth RevelsJustin Fowler, DPM;  [facility];  Service: Podiatry;  Laterality: Left;  WITH POPLITEAL  . HAMMER TOE SURGERY Left 06/16/2015   Procedure: HAMMER TOE CORRECTION SECOND TOE LEFT;  Surgeon: Gwyneth RevelsJustin Fowler, DPM;  [facility];  Service: Podiatry;  Laterality: Left;  prediabetic - on oral meds  . hip replacemanet Right 03/15/2018   Alcus DadJames Bowers-Emergeortho  . MYOMECTOMY    . OOPHORECTOMY    . trigger finger Right 12/15/2014  . Vulva cyst  11/2006    Family History  Problem Relation Age of Onset  . Dementia Mother   . Diabetes Father   . Stroke Father   . Cancer Daughter        Fibroid Tumors  . Diabetes Brother   .  Breast cancer Neg Hx     Social History   Tobacco Use  . Smoking status: Never Smoker  . Smokeless tobacco: Never Used  . Tobacco comment: smoking cessation materials not required  Substance Use Topics  . Alcohol use: No    Alcohol/week: 0.0 standard drinks     Current Outpatient Medications:  .  acetaminophen (TYLENOL) 500 MG tablet, Take by mouth. Reported on 11/15/2015, Disp: , Rfl:  .  aspirin EC 81 MG tablet, Take 81 mg by mouth daily., Disp: , Rfl:  .  atorvastatin (LIPITOR) 40 MG  tablet, Take 1 tablet (40 mg total) by mouth every evening. for cholesterol, Disp: 90 tablet, Rfl: 1 .  buPROPion (WELLBUTRIN XL) 150 MG 24 hr tablet, Take 1 tablet (150 mg total) by mouth daily before breakfast., Disp: 90 tablet, Rfl: 1 .  Cholecalciferol (VITAMIN D PO), Take 1 capsule by mouth daily. 3000 IU daily, Disp: , Rfl:  .  citalopram (CELEXA) 20 MG tablet, Take 1 tablet (20 mg total) by mouth daily., Disp: 90 tablet, Rfl: 1 .  Cyanocobalamin (B-12) 1000 MCG SUBL, Place 1 each under the tongue 2 (two) times a week. , Disp: , Rfl:  .  docusate sodium (COLACE) 100 MG capsule, Take by mouth daily. PRN only, Disp: , Rfl:  .  fluticasone (FLONASE) 50 MCG/ACT nasal spray, SPRAY 2 SPRAYS INTO EACH NOSTRIL EVERY DAY, Disp: 48 mL, Rfl: 1 .  glucose blood (ONETOUCH VERIO) test strip, Use as instructed, Disp: 100 each, Rfl: 12 .  loratadine (CLARITIN) 10 MG tablet, Take 1 tablet (10 mg total) by mouth daily., Disp: 90 tablet, Rfl: 1 .  metFORMIN (GLUCOPHAGE) 500 MG tablet, Take 1 tablet (500 mg total) by mouth 2 (two) times daily., Disp: 180 tablet, Rfl: 1 .  nadolol (CORGARD) 20 MG tablet, Take 1 tablet (20 mg total) by mouth daily., Disp: 90 tablet, Rfl: 1 .  naproxen sodium (ALEVE) 220 MG tablet, Take 220 mg by mouth. PRN occasionally, Disp: , Rfl:  .  Olmesartan-amLODIPine-HCTZ 20-5-12.5 MG TABS, TAKE 1 TABLET BY MOUTH EVERY DAY, Disp: 90 tablet, Rfl: 1 .  triamcinolone cream (KENALOG) 0.1 %, Reported on 07/15/2015, Disp: , Rfl:   Allergies  Allergen Reactions  . Duloxetine     sweat    I personally reviewed active problem list, medication list, allergies, family history, social history, health maintenance with the patient/caregiver today.   ROS  Constitutional: Negative for fever or weight change.  Respiratory: Negative for cough and shortness of breath.   Cardiovascular: Negative for chest pain or palpitations.  Gastrointestinal: Negative for abdominal pain, no bowel changes.   Musculoskeletal: Negative for gait problem or joint swelling.  Skin: Negative for rash.  Neurological: Negative for dizziness or headache.  No other specific complaints in a complete review of systems (except as listed in HPI above).  Objective  Vitals:   03/15/20 1028  BP: 132/80  Pulse: 72  Resp: 16  Temp: 98.1 F (36.7 C)  TempSrc: Oral  SpO2: 100%  Weight: 199 lb 4.8 oz (90.4 kg)  Height: 5\' 2"  (1.575 m)    Body mass index is 36.45 kg/m.  Physical Exam  Constitutional: Patient appears well-developed and well-nourished. Obese  No distress.  HEENT: head atraumatic, normocephalic, pupils equal and reactive to light, neck supple, throat within normal limits Cardiovascular: Normal rate, regular rhythm and normal heart sounds.  No murmur heard. No BLE edema. Pulmonary/Chest: Effort normal and breath sounds normal. No respiratory distress.  Abdominal: Soft.  There is no tenderness. Psychiatric: Patient has a normal mood and affect. behavior is normal. Judgment and thought content normal.  PHQ2/9: Depression screen [facility] 2/9 03/15/2020 11/04/2019 10/28/2019 07/01/2019 02/24/2019  Decreased Interest 0 1 0 1 0  Down, Depressed, Hopeless 0 3 0 0 1  PHQ - 2 Score 0 4 0 1 1  Altered sleeping - 2 - 0 0  Tired, decreased energy - 1 - 1 1  Change in appetite - 0 - 1 0  Feeling bad or failure about yourself  - 0 - 0 0  Trouble concentrating - 0 - 0 0  Moving slowly or fidgety/restless - 0 - 0 0  Suicidal thoughts - 0 - 0 0  PHQ-9 Score - 7 - 3 2  Difficult doing work/chores - Not difficult at all - Not difficult at all Not difficult at all  Some recent data might be hidden    phq 9 is negative   Fall Risk: Fall Risk  03/15/2020 11/04/2019 10/28/2019 07/01/2019 02/24/2019  Falls in the past year? 0 0 0 0 0  Comment - - - - -  Number falls in past yr: 0 0 0 0 0  Injury with Fall? 0 0 0 0 0  Comment - - - - -  Risk Factor Category  - - - - -  Risk for fall due to : - - Orthopedic  patient - -  Risk for fall due to: Comment - - - - -  Follow up - - Falls prevention discussed Falls evaluation completed -      Functional Status Survey: Is the patient deaf or have difficulty hearing?: No Does the patient have difficulty seeing, even when wearing glasses/contacts?: No Does the patient have difficulty concentrating, remembering, or making decisions?: No Does the patient have difficulty walking or climbing stairs?: No Does the patient have difficulty dressing or bathing?: No Does the patient have difficulty doing errands alone such as visiting a doctor's office or shopping?: No    Assessment & Plan  1. Morbid obesity (HCC)  Continue Weight Watchers   2. Benign essential HTN  - nadolol (CORGARD) 20 MG tablet; Take 1 tablet (20 mg total) by mouth daily.  Dispense: 90 tablet; Refill: 1  3. Need for immunization against influenza  - Flu Vaccine QUAD 36+ mos IM  4. Dyslipidemia  - atorvastatin (LIPITOR) 40 MG tablet; Take 1 tablet (40 mg total) by mouth every evening. for cholesterol  Dispense: 90 tablet; Refill: 1  5. B12 deficiency   6. Depression, major, recurrent, mild (HCC)  - citalopram (CELEXA) 20 MG tablet; Take 1 tablet (20 mg total) by mouth daily.  Dispense: 90 tablet; Refill: 1 - buPROPion (WELLBUTRIN XL) 150 MG 24 hr tablet; Take 1 tablet (150 mg total) by mouth daily before breakfast.  Dispense: 90 tablet; Refill: 1  7. Vitamin D deficiency  Continue Vitamin d   8. Hyperglycemia  - metFORMIN (GLUCOPHAGE) 500 MG tablet; Take 1 tablet (500 mg total) by mouth 2 (two) times daily.  Dispense: 180 tablet; Refill: 1  9. Dysmetabolic syndrome  - metFORMIN (GLUCOPHAGE) 500 MG tablet; Take 1 tablet (500 mg total) by mouth 2 (two) times daily.  Dispense: 180 tablet; Refill: 1  10. GAD (generalized anxiety disorder)  - citalopram (CELEXA) 20 MG tablet; Take 1 tablet (20 mg total) by mouth daily.  Dispense: 90 tablet; Refill: 1 - buPROPion  (WELLBUTRIN XL) 150 MG 24 hr tablet; Take 1 tablet (  150 mg total) by mouth daily before breakfast.  Dispense: 90 tablet; Refill: 1  11. Perennial allergic rhinitis with seasonal variation  - loratadine (CLARITIN) 10 MG tablet; Take 1 tablet (10 mg total) by mouth daily.  Dispense: 90 tablet; Refill: 1

## 2020-03-15 ENCOUNTER — Other Ambulatory Visit: Payer: Self-pay

## 2020-03-15 ENCOUNTER — Ambulatory Visit: Payer: Medicare PPO | Admitting: Family Medicine

## 2020-03-15 ENCOUNTER — Encounter: Payer: Self-pay | Admitting: Family Medicine

## 2020-03-15 DIAGNOSIS — E8881 Metabolic syndrome: Secondary | ICD-10-CM

## 2020-03-15 DIAGNOSIS — J302 Other seasonal allergic rhinitis: Secondary | ICD-10-CM

## 2020-03-15 DIAGNOSIS — I10 Essential (primary) hypertension: Secondary | ICD-10-CM

## 2020-03-15 DIAGNOSIS — Z23 Encounter for immunization: Secondary | ICD-10-CM

## 2020-03-15 DIAGNOSIS — R739 Hyperglycemia, unspecified: Secondary | ICD-10-CM

## 2020-03-15 DIAGNOSIS — E785 Hyperlipidemia, unspecified: Secondary | ICD-10-CM | POA: Diagnosis not present

## 2020-03-15 DIAGNOSIS — E559 Vitamin D deficiency, unspecified: Secondary | ICD-10-CM | POA: Diagnosis not present

## 2020-03-15 DIAGNOSIS — E538 Deficiency of other specified B group vitamins: Secondary | ICD-10-CM

## 2020-03-15 DIAGNOSIS — J3089 Other allergic rhinitis: Secondary | ICD-10-CM

## 2020-03-15 DIAGNOSIS — F411 Generalized anxiety disorder: Secondary | ICD-10-CM

## 2020-03-15 DIAGNOSIS — F330 Major depressive disorder, recurrent, mild: Secondary | ICD-10-CM

## 2020-03-15 MED ORDER — BUPROPION HCL ER (XL) 150 MG PO TB24: 150.0000 mg | ORAL_TABLET | Freq: Every day | ORAL | 1 refills | Status: DC

## 2020-03-15 MED ORDER — NADOLOL 20 MG PO TABS: 20.0000 mg | ORAL_TABLET | Freq: Every day | ORAL | 1 refills | Status: DC

## 2020-03-15 MED ORDER — ATORVASTATIN CALCIUM 40 MG PO TABS: 40.0000 mg | ORAL_TABLET | Freq: Every evening | ORAL | 1 refills | Status: DC

## 2020-03-15 MED ORDER — METFORMIN HCL 500 MG PO TABS: 500.0000 mg | ORAL_TABLET | Freq: Two times a day (BID) | ORAL | 1 refills | Status: DC

## 2020-03-15 MED ORDER — LORATADINE 10 MG PO TABS: 10.0000 mg | ORAL_TABLET | Freq: Every day | ORAL | 1 refills | Status: DC

## 2020-03-15 MED ORDER — CITALOPRAM HYDROBROMIDE 20 MG PO TABS: 20.0000 mg | ORAL_TABLET | Freq: Every day | ORAL | 1 refills | Status: DC

## 2020-06-09 ENCOUNTER — Other Ambulatory Visit: Payer: Self-pay | Admitting: Family Medicine

## 2020-06-09 DIAGNOSIS — Z1231 Encounter for screening mammogram for malignant neoplasm of breast: Secondary | ICD-10-CM

## 2020-06-18 NOTE — Progress Notes (Signed)
Name: Monique Baker   MRN: [id number]    DOB: 1950-05-15   Date:06/21/2020       Progress Note  Subjective  Chief Complaint  Annual Exam  HPI  Patient presents for annual CPE and follow up, she is aware may have extra cost   Depression Major: she is married to a Theme park manager, he also works as a Network engineer at [employer] We started her on Cymbalta 07/2017was doing better but caused hair loss, so she is now on Citalopramand Wellbutrin XL. She states she still misses her brother - he died in 06/18/19 She has noticed lack of motivation, her husband is always busy and still does not have time for her. She has family in [place] - and we discussed maybe going to church there once a month to see her cousins and go to the church she grew up at   Thick toenails: both feet but has more pain on right 2nd and 3 rd toes, she was seen by Dr. Vickki Muff in the past for bunion surgery and we will place referral again   Recurrent Falls: twice in the past 3 months, once riding a bike and once fell on the curb. Discussed fall prevention. Discussed PT or join a gym, she chose the later   Diet: she is trying to eat a balanced diet  Exercise: discussed 150 minutes per week   [place] Office Visit from 07/01/2019 in [facility]  AUDIT-C Score 0     Depression: Phq 9 is  Positive   Depression screen [facility] 2/9 06/21/2020 03/15/2020 11/04/2019 10/28/2019 07/01/2019  Decreased Interest 1 0 1 0 1  Down, Depressed, Hopeless 1 0 3 0 0  PHQ - 2 Score 2 0 4 0 1  Altered sleeping 0 - 2 - 0  Tired, decreased energy 1 - 1 - 1  Change in appetite 1 - 0 - 1  Feeling bad or failure about yourself  0 - 0 - 0  Trouble concentrating 0 - 0 - 0  Moving slowly or fidgety/restless 0 - 0 - 0  Suicidal thoughts 0 - 0 - 0  PHQ-9 Score 4 - 7 - 3  Difficult doing work/chores - - Not difficult at all - Not difficult at all  Some recent data might be hidden   Hypertension: BP Readings from Last 3 Encounters:  06/21/20  134/82  03/15/20 132/80  11/04/19 130/76   Obesity: Wt Readings from Last 3 Encounters:  06/21/20 200 lb (90.7 kg)  03/15/20 199 lb 4.8 oz (90.4 kg)  11/04/19 197 lb 3.2 oz (89.4 kg)   BMI Readings from Last 3 Encounters:  06/21/20 36.58 kg/m  03/15/20 36.45 kg/m  11/04/19 36.07 kg/m     Vaccines:   Shingrix: 76-64 yo and ask insurance if covered when patient above 24 yo Pneumonia: educated and discussed with patient. Flu: educated and discussed with patient.  Hep C Screening: 12/28/11 STD testing and prevention (HIV/chl/gon/syphilis): N/A Intimate partner violence: negative Sexual History : not sexually active, states husband is very busy.  Menstrual History/LMP/Abnormal Bleeding: discussed post-menopausal bleeding  Incontinence Symptoms: no problems   Breast cancer:  - Last Mammogram:  07/08/19, already scheduled  - BRCA gene screening: N/A  Osteoporosis: Discussed high calcium and vitamin D supplementation, weight bearing exercises  Cervical cancer screening: N/A  Skin cancer: Discussed monitoring for atypical lesions  Colorectal cancer: 03/15/16 Lung cancer: Low Dose CT Chest recommended if Age 73-80 years, 20 pack-year currently smoking OR have  quit w/in 15years. Patient does not qualify.   ECG: 01/23/18  Advanced Care Planning: A voluntary discussion about advance care planning including the explanation and discussion of advance directives.  Discussed health care proxy and Living will, and the patient was able to identify a health care proxy as husband .   Lipids: Lab Results  Component Value Date   CHOL 132 11/04/2019   CHOL 117 11/14/2018   CHOL 124 01/23/2018   Lab Results  Component Value Date   HDL 60 11/04/2019   HDL 55 11/14/2018   HDL 59 01/23/2018   Lab Results  Component Value Date   LDLCALC 56 11/04/2019   LDLCALC 46 11/14/2018   LDLCALC 47 01/23/2018   Lab Results  Component Value Date   TRIG 80 11/04/2019   TRIG 75 11/14/2018   TRIG  96 01/23/2018   Lab Results  Component Value Date   CHOLHDL 2.2 11/04/2019   CHOLHDL 2.1 11/14/2018   CHOLHDL 2.1 01/23/2018   No results found for: LDLDIRECT  Glucose: Glucose, Bld  Date Value Ref Range Status  11/04/2019 84 65 - 99 mg/dL Final    Comment:    .            Fasting reference interval .   11/14/2018 84 65 - 99 mg/dL Final    Comment:    .            Fasting reference interval .   01/23/2018 82 65 - 99 mg/dL Final    Comment:    .            Fasting reference interval .    Glucose-Capillary  Date Value Ref Range Status  03/15/2016 110 (H) 65 - 99 mg/dL Final  06/16/2015 80 65 - 99 mg/dL Final  06/16/2015 93 65 - 99 mg/dL Final    Patient Active Problem List   Diagnosis Date Noted  . Osteoarthritis of hip 02/19/2018  . Osteopenia of hip 09/04/2016  . History of bunionectomy of left great toe 07/15/2015  . H/O hammer toe correction 03/15/2015  . Arthritis of lumbar spine 01/19/2015  . Benign essential HTN 11/05/2014  . Carpal tunnel syndrome 11/05/2014  . Osteoarthritis 11/05/2014  . Dermatitis, eczematoid 11/05/2014  . Depression, major, recurrent, mild ([place]) 11/05/2014  . Dyslipidemia 11/05/2014  . Gastro-esophageal reflux disease without esophagitis 11/05/2014  . Morbid obesity ([place]) 11/05/2014  . Dysmetabolic syndrome 54/27/0623  . Menopausal and perimenopausal disorder 11/05/2014  . Perennial allergic rhinitis with seasonal variation 11/05/2014  . Seborrhea capitis 11/05/2014  . Acquired trigger finger 11/05/2014  . Urinary incontinence in female 11/05/2014  . Vitamin D deficiency 11/05/2014  . Blood glucose elevated 08/31/2009    Past Surgical History:  Procedure Laterality Date  . ABDOMINAL HYSTERECTOMY    . CATARACT EXTRACTION, BILATERAL    . CESAREAN SECTION    . COLONOSCOPY WITH PROPOFOL N/A 03/15/2016   Procedure: COLONOSCOPY WITH PROPOFOL;  Surgeon: Christene Lye, MD;  Location: [facility];  Service: Endoscopy;   Laterality: N/A;  . HALLUX VALGUS LAPIDUS Left 06/16/2015   Procedure: HALLUX VALGUS LAPIDUS;  Surgeon: Samara Deist, DPM;  Location: [place];  Service: Podiatry;  Laterality: Left;  WITH POPLITEAL  . HAMMER TOE SURGERY Left 06/16/2015   Procedure: HAMMER TOE CORRECTION SECOND TOE LEFT;  Surgeon: Samara Deist, DPM;  Location: [place];  Service: Podiatry;  Laterality: Left;  prediabetic - on oral meds  . hip replacemanet Right 03/15/2018   Jeneen Rinks  [place]  . MYOMECTOMY    . OOPHORECTOMY    . trigger finger Right 12/15/2014  . Vulva cyst  11/2006    Family History  Problem Relation Age of Onset  . Dementia Mother   . Diabetes Father   . Stroke Father   . Cancer Daughter        Fibroid Tumors  . Diabetes Brother   . Breast cancer Neg Hx     Social History   Socioeconomic History  . Marital status: Married    Spouse name: Ilona Sorrel  . Number of children: 1  . Years of education: Not on file  . Highest education level: Bachelor's degree (e.g., BA, AB, BS)  Occupational History  . Occupation: retired Pharmacist, hospital    Comment: [employer]  Tobacco Use  . Smoking status: Never Smoker  . Smokeless tobacco: Never Used  . Tobacco comment: smoking cessation materials not required  Vaping Use  . Vaping Use: Never used  Substance and Sexual Activity  . Alcohol use: No    Alcohol/week: 0.0 standard drinks  . Drug use: No  . Sexual activity: Not Currently    Partners: Male  Other Topics Concern  . Not on file  Social History Narrative   Married to a Theme park manager and 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, clinical (histocompatibility and immunogenetics) )    Social Determinants of Radio broadcast assistant Strain: [place]   . Difficulty of Paying Living Expenses: Not hard at all  Food Insecurity: No Food Insecurity  . Worried About Charity fundraiser in the Last Year: Never true  . Ran Out of Food in the Last Year: Never true  Transportation Needs: No Transportation Needs  . Lack of Transportation  (Medical): No  . Lack of Transportation (Non-Medical): No  Physical Activity: Inactive  . Days of Exercise per Week: 0 days  . Minutes of Exercise per Session: 10 min  Stress: No Stress Concern Present  . Feeling of Stress : Only a little  Social Connections: Socially Integrated  . Frequency of Communication with Friends and Family: More than three times a week  . Frequency of Social Gatherings with Friends and Family: Twice a week  . Attends Religious Services: More than 4 times per year  . Active Member of Clubs or Organizations: Yes  . Attends Archivist Meetings: More than 4 times per year  . Marital Status: Married  Human resources officer Violence: Not At Risk  . Fear of Current or Ex-Partner: No  . Emotionally Abused: No  . Physically Abused: No  . Sexually Abused: No     Current Outpatient Medications:  .  acetaminophen (TYLENOL) 500 MG tablet, Take by mouth. Reported on 11/15/2015, Disp: , Rfl:  .  aspirin EC 81 MG tablet, Take 81 mg by mouth daily., Disp: , Rfl:  .  atorvastatin (LIPITOR) 40 MG tablet, Take 1 tablet (40 mg total) by mouth every evening. for cholesterol, Disp: 90 tablet, Rfl: 1 .  buPROPion (WELLBUTRIN XL) 150 MG 24 hr tablet, Take 1 tablet (150 mg total) by mouth daily before breakfast., Disp: 90 tablet, Rfl: 1 .  Cholecalciferol (VITAMIN D PO), Take 1 capsule by mouth daily. 3000 IU daily, Disp: , Rfl:  .  citalopram (CELEXA) 20 MG tablet, Take 1 tablet (20 mg total) by mouth daily., Disp: 90 tablet, Rfl: 1 .  Cyanocobalamin (B-12) 1000 MCG SUBL, Place 1 each under the tongue 2 (two) times a week. , Disp: , Rfl:  .  fluticasone (FLONASE) 50 MCG/ACT nasal spray, SPRAY 2 SPRAYS INTO EACH NOSTRIL EVERY DAY, Disp: 48 mL, Rfl: 1 .  glucose blood (ONETOUCH VERIO) test strip, Use as instructed, Disp: 100 each, Rfl: 12 .  loratadine (CLARITIN) 10 MG tablet, Take 1 tablet (10 mg total) by mouth daily., Disp: 90 tablet, Rfl: 1 .  metFORMIN (GLUCOPHAGE) 500 MG  tablet, Take 1 tablet (500 mg total) by mouth 2 (two) times daily., Disp: 180 tablet, Rfl: 1 .  nadolol (CORGARD) 20 MG tablet, Take 1 tablet (20 mg total) by mouth daily., Disp: 90 tablet, Rfl: 1 .  naproxen sodium (ALEVE) 220 MG tablet, Take 220 mg by mouth. PRN occasionally, Disp: , Rfl:  .  Olmesartan-amLODIPine-HCTZ 20-5-12.5 MG TABS, TAKE 1 TABLET BY MOUTH EVERY DAY, Disp: 90 tablet, Rfl: 1 .  triamcinolone cream (KENALOG) 0.1 %, Reported on 07/15/2015, Disp: , Rfl:  .  docusate sodium (COLACE) 100 MG capsule, Take by mouth daily. PRN only (Patient not taking: Reported on 06/21/2020), Disp: , Rfl:   Allergies  Allergen Reactions  . Duloxetine     sweat     ROS  Constitutional: Negative for fever or weight change.  Respiratory: Negative for cough and shortness of breath.   Cardiovascular: Negative for chest pain or palpitations.  Gastrointestinal: Negative for abdominal pain, no bowel changes.  Musculoskeletal: Negative for gait problem or joint swelling.  Skin: Negative for rash.  Neurological: Negative for dizziness or headache.  No other specific complaints in a complete review of systems (except as listed in HPI above).  Objective  Vitals:   06/21/20 1109  BP: 134/82  Pulse: 77  Resp: 16  Temp: 99.2 F (37.3 C)  TempSrc: Oral  SpO2: 99%  Weight: 200 lb (90.7 kg)  Height: 5' 2" (1.575 m)    Body mass index is 36.58 kg/m.  Physical Exam  Constitutional: Patient appears well-developed and well-nourished. No distress.  HENT: Head: Normocephalic and atraumatic. Ears: B TMs ok, no erythema or effusion; Nose: Not done . Mouth/Throat: not done  Eyes: Conjunctivae and EOM are normal. Pupils are equal, round, and reactive to light. No scleral icterus.  Neck: Normal range of motion. Neck supple. No JVD present. No thyromegaly present.  Cardiovascular: Normal rate, regular rhythm and normal heart sounds.  No murmur heard. No BLE edema. Pulmonary/Chest: Effort normal and  breath sounds normal. No respiratory distress. Abdominal: Soft. Bowel sounds are normal, no distension. There is no tenderness. no masses Breast: no lumps or masses, no nipple discharge or rashes FEMALE GENITALIA:  Not done  RECTAL: not done  Musculoskeletal: Normal range of motion, no joint effusions. No gross deformities Neurological: he is alert and oriented to person, place, and time. No cranial nerve deficit. Coordination, balance, strength, speech and gait are normal.  Skin: Skin is warm and dry. No rash noted. No erythema.  Psychiatric: Patient has a normal mood and affect. behavior is normal. Judgment and thought content normal.  Fall Risk: Fall Risk  06/21/2020 03/15/2020 11/04/2019 10/28/2019 07/01/2019  Falls in the past year? 1 0 0 0 0  Comment - - - - -  Number falls in past yr: 1 0 0 0 0  Injury with Fall? 0 0 0 0 0  Comment - - - - -  Risk Factor Category  - - - - -  Risk for fall due to : - - - Orthopedic patient -  Risk for fall due to: Comment - - - - -  Follow up - - - Falls prevention discussed Falls evaluation completed     Functional Status Survey: Is the patient deaf or have difficulty hearing?: No Does the patient have difficulty seeing, even when wearing glasses/contacts?: No Does the patient have difficulty concentrating, remembering, or making decisions?: No Does the patient have difficulty walking or climbing stairs?: No Does the patient have difficulty dressing or bathing?: No Does the patient have difficulty doing errands alone such as visiting a doctor's office or shopping?: No   Assessment & Plan  1. Depression, major, recurrent, mild ([place])  Discussed counseling  2. Nail problem  - Ambulatory referral to Podiatry  3. Well adult exam   4. Morbid obesity ([place])  Discussed with the patient the risk posed by an increased BMI. Discussed importance of portion control, calorie counting and at least 150 minutes of physical activity weekly. Avoid sweet  beverages and drink more water. Eat at least 6 servings of fruit and vegetables daily   5. History of fall  Discussed increase in physical activity   -USPSTF grade A and B recommendations reviewed with patient; age-appropriate recommendations, preventive care, screening tests, etc discussed and encouraged; healthy living encouraged; see AVS for patient education given to patient -Discussed importance of 150 minutes of physical activity weekly, eat two servings of fish weekly, eat one serving of tree nuts ( cashews, pistachios, pecans, almonds.Marland Kitchen) every other day, eat 6 servings of fruit/vegetables daily and drink plenty of water and avoid sweet beverages.

## 2020-06-21 ENCOUNTER — Encounter: Payer: Self-pay | Admitting: Family Medicine

## 2020-06-21 ENCOUNTER — Other Ambulatory Visit: Payer: Self-pay

## 2020-06-21 ENCOUNTER — Ambulatory Visit (INDEPENDENT_AMBULATORY_CARE_PROVIDER_SITE_OTHER): Payer: Medicare PPO | Admitting: Family Medicine

## 2020-06-21 VITALS — BP 134/82 | HR 77 | Temp 99.2°F | Resp 16 | Ht 62.0 in | Wt 200.0 lb

## 2020-06-21 DIAGNOSIS — F330 Major depressive disorder, recurrent, mild: Secondary | ICD-10-CM | POA: Diagnosis not present

## 2020-06-21 DIAGNOSIS — Z9181 History of falling: Secondary | ICD-10-CM | POA: Diagnosis not present

## 2020-06-21 DIAGNOSIS — L609 Nail disorder, unspecified: Secondary | ICD-10-CM | POA: Diagnosis not present

## 2020-06-21 DIAGNOSIS — Z0000 Encounter for general adult medical examination without abnormal findings: Secondary | ICD-10-CM | POA: Diagnosis not present

## 2020-06-21 NOTE — Patient Instructions (Addendum)
Check coverage for shingrix vaccine   Preventive Care 70 Years and Older, Female Preventive care refers to lifestyle choices and visits with your health care provider that can promote health and wellness. This includes:  A yearly physical exam. This is also called an annual wellness visit.  Regular dental and eye exams.  Immunizations.  Screening for certain conditions.  Healthy lifestyle choices, such as: ? Eating a healthy diet. ? Getting regular exercise. ? Not using drugs or products that contain nicotine and tobacco. ? Limiting alcohol use. What can I expect for my preventive care visit? Physical exam Your health care provider will check your:  Height and weight. These may be used to calculate your BMI (body mass index). BMI is a measurement that tells if you are at a healthy weight.  Heart rate and blood pressure.  Body temperature.  Skin for abnormal spots. Counseling Your health care provider may ask you questions about your:  Past medical problems.  Family's medical history.  Alcohol, tobacco, and drug use.  Emotional well-being.  Home life and relationship well-being.  Sexual activity.  Diet, exercise, and sleep habits.  History of falls.  Memory and ability to understand (cognition).  Work and work Statistician.  Pregnancy and menstrual history.  Access to firearms. What immunizations do I need? Vaccines are usually given at various ages, according to a schedule. Your health care provider will recommend vaccines for you based on your age, medical history, and lifestyle or other factors, such as travel or where you work.   What tests do I need? Blood tests  Lipid and cholesterol levels. These may be checked every 5 years, or more often depending on your overall health.  Hepatitis C test.  Hepatitis B test. Screening  Lung cancer screening. You may have this screening every year starting at age 70 if you have a 30-pack-year history of smoking  and currently smoke or have quit within the past 15 years.  Colorectal cancer screening. ? All adults should have this screening starting at age 70 and continuing until age 70. ? Your health care provider may recommend screening at age 1 if you are at increased risk. ? You will have tests every 1-10 years, depending on your results and the type of screening test.  Diabetes screening. ? This is done by checking your blood sugar (glucose) after you have not eaten for a while (fasting). ? You may have this done every 1-3 years.  Mammogram. ? This may be done every 1-2 years. ? Talk with your health care provider about how often you should have regular mammograms.  Abdominal aortic aneurysm (AAA) screening. You may need this if you are a current or former smoker.  BRCA-related cancer screening. This may be done if you have a family history of breast, ovarian, tubal, or peritoneal cancers. Other tests  STD (sexually transmitted disease) testing, if you are at risk.  Bone density scan. This is done to screen for osteoporosis. You may have this done starting at age 70. Talk with your health care provider about your test results, treatment options, and if necessary, the need for more tests. Follow these instructions at home: Eating and drinking  Eat a diet that includes fresh fruits and vegetables, whole grains, lean protein, and low-fat dairy products. Limit your intake of foods with high amounts of sugar, saturated fats, and salt.  Take vitamin and mineral supplements as recommended by your health care provider.  Do not drink alcohol if your health care  provider tells you not to drink.  If you drink alcohol: ? Limit how much you have to 0-1 drink a day. ? Be aware of how much alcohol is in your drink. In the [place] one drink equals one 12 oz bottle of beer (355 mL), one 5 oz glass of wine (148 mL), or one 1 oz glass of hard liquor (44 mL).   Lifestyle  Take daily care of your teeth  and gums. Brush your teeth every morning and night with fluoride toothpaste. Floss one time each day.  Stay active. Exercise for at least 30 minutes 5 or more days each week.  Do not use any products that contain nicotine or tobacco, such as cigarettes, e-cigarettes, and chewing tobacco. If you need help quitting, ask your health care provider.  Do not use drugs.  If you are sexually active, practice safe sex. Use a condom or other form of protection in order to prevent STIs (sexually transmitted infections).  Talk with your health care provider about taking a low-dose aspirin or statin.  Find healthy ways to cope with stress, such as: ? Meditation, yoga, or listening to music. ? Journaling. ? Talking to a trusted person. ? Spending time with friends and family. Safety  Always wear your seat belt while driving or riding in a vehicle.  Do not drive: ? If you have been drinking alcohol. Do not ride with someone who has been drinking. ? When you are tired or distracted. ? While texting.  Wear a helmet and other protective equipment during sports activities.  If you have firearms in your house, make sure you follow all gun safety procedures. What's next?  Visit your health care provider once a year for an annual wellness visit.  Ask your health care provider how often you should have your eyes and teeth checked.  Stay up to date on all vaccines. This information is not intended to replace advice given to you by your health care provider. Make sure you discuss any questions you have with your health care provider. Document Revised: 04/07/2020 Document Reviewed: 04/11/2018 Elsevier Patient Education  2021 [employer].

## 2020-07-20 DIAGNOSIS — M79674 Pain in right toe(s): Secondary | ICD-10-CM | POA: Diagnosis not present

## 2020-07-20 DIAGNOSIS — M79675 Pain in left toe(s): Secondary | ICD-10-CM | POA: Diagnosis not present

## 2020-07-20 DIAGNOSIS — B351 Tinea unguium: Secondary | ICD-10-CM | POA: Diagnosis not present

## 2020-07-20 DIAGNOSIS — E119 Type 2 diabetes mellitus without complications: Secondary | ICD-10-CM | POA: Diagnosis not present

## 2020-07-26 ENCOUNTER — Other Ambulatory Visit: Payer: Self-pay

## 2020-07-26 ENCOUNTER — Ambulatory Visit
Admission: RE | Admit: 2020-07-26 | Discharge: 2020-07-26 | Disposition: A | Discharge status: Home / Self Care | Payer: Medicare PPO | Source: Ambulatory Visit | Attending: Family Medicine | Admitting: Family Medicine

## 2020-07-26 DIAGNOSIS — Z1231 Encounter for screening mammogram for malignant neoplasm of breast: Secondary | ICD-10-CM | POA: Insufficient documentation

## 2020-07-28 ENCOUNTER — Other Ambulatory Visit: Payer: Self-pay

## 2020-07-28 ENCOUNTER — Ambulatory Visit (INDEPENDENT_AMBULATORY_CARE_PROVIDER_SITE_OTHER): Payer: Medicare PPO | Admitting: Emergency Medicine

## 2020-07-28 VITALS — BP 128/82 | HR 77 | Resp 16

## 2020-07-28 DIAGNOSIS — I10 Essential (primary) hypertension: Secondary | ICD-10-CM

## 2020-07-28 NOTE — Progress Notes (Signed)
Patient came in for BP check. BP at home with wrist cuff was 160/96 Left and 148?91 right. She stated she checked at [facility] and got 135/73. Patient stated that she di buy a new BP machine. Per Dr. Carlynn Purl patient to bring her new machine by office at next visit to compare the readings. Patient verbalize understanding. No medication changes at this visit

## 2020-08-26 ENCOUNTER — Other Ambulatory Visit: Payer: Self-pay | Admitting: Family Medicine

## 2020-08-26 DIAGNOSIS — I10 Essential (primary) hypertension: Secondary | ICD-10-CM

## 2020-08-26 NOTE — Telephone Encounter (Signed)
Requested Prescriptions  Pending Prescriptions Disp Refills  . Olmesartan-amLODIPine-HCTZ 20-5-12.5 MG TABS [Pharmacy Med Name: OLMSRTN-AMLDPN-HCTZ 20-5-12.5] 90 tablet 0    Sig: TAKE 1 TABLET BY MOUTH EVERY DAY     Cardiovascular: CCB + ARB + Diuretic Combos Failed - 08/26/2020  1:37 AM      Failed - K in normal range and within 180 days    Potassium  Date Value Ref Range Status  11/04/2019 4.2 3.5 - 5.3 mmol/L Final         Failed - Na in normal range and within 180 days    Sodium  Date Value Ref Range Status  11/04/2019 141 135 - 146 mmol/L Final  11/09/2014 142 134 - 144 mmol/L Final         Failed - Cr in normal range and within 180 days    Creat  Date Value Ref Range Status  11/04/2019 0.77 0.50 - 0.99 mg/dL Final    Comment:    For patients >45 years of age, the reference limit for Creatinine is approximately 13% higher for people identified as African-American. .          Failed - Ca in normal range and within 180 days    Calcium  Date Value Ref Range Status  11/04/2019 10.0 8.6 - 10.4 mg/dL Final         Passed - Patient is not pregnant      Passed - Last BP in normal range    BP Readings from Last 1 Encounters:  07/28/20 128/82         Passed - Last Heart Rate in normal range    Pulse Readings from Last 1 Encounters:  07/28/20 77         Passed - Valid encounter within last 6 months    Recent Outpatient Visits          2 months ago Depression, major, recurrent, mild (H[facility])   [facility] Alba Cory, MD   5 months ago Morbid obesity [facility])   [facility] Alba Cory, MD   9 months ago Benign essential HTN   [facility] Alba Cory, MD   1 year ago Benign essential HTN   [facility] Alba Cory, MD   1 year ago Well woman exam   [facility] Alba Cory, MD      Future Appointments            In 1 month Carlynn Purl, Danna Hefty, MD [facility]   In 2 months  [facility]   In 10 months Alba Cory, MD [facility], [facility]

## 2020-09-06 ENCOUNTER — Other Ambulatory Visit: Payer: Self-pay | Admitting: Family Medicine

## 2020-09-06 DIAGNOSIS — F330 Major depressive disorder, recurrent, mild: Secondary | ICD-10-CM

## 2020-09-06 DIAGNOSIS — J3089 Other allergic rhinitis: Secondary | ICD-10-CM

## 2020-09-06 DIAGNOSIS — F411 Generalized anxiety disorder: Secondary | ICD-10-CM

## 2020-09-06 DIAGNOSIS — J302 Other seasonal allergic rhinitis: Secondary | ICD-10-CM

## 2020-09-23 NOTE — Progress Notes (Deleted)
Name: Monique Baker   MRN: [id number]    DOB: [birth year]   Date:09/23/2020       Progress Note  Subjective  Chief Complaint  Follow Up  HPI  Depression Major: she is married to a Education officer, environmental, he also works as a Airline pilot at [employer]. We started her on Cymbalta 07/2017was doing better but caused hair loss, so she is now on Citalopramand Wellbutrin XL. She states she still misses her brother - he died in 08-27-19 She has noticed lack of motivation, her husband is always busy and still does not have time for her. She has family in [place] - and we discussed maybe going to church there once a month to see her cousins and go to the church she grew up at   Thick toenails: both feet but has more pain on right 2nd and 3 rd toes, she was seen by Dr. Ether Griffins in the past for bunion surgery and we will place referral again   Recurrent Falls: twice in the past 3 months, once riding a bike and once fell on the curb. Discussed fall prevention. Discussed PT or join a gym, she chose the later  Patient Active Problem List   Diagnosis Date Noted  . Osteoarthritis of hip 02/19/2018  . Osteopenia of hip 09/04/2016  . History of bunionectomy of left great toe 07/15/2015  . H/O hammer toe correction 03/15/2015  . Arthritis of lumbar spine 01/19/2015  . Benign essential HTN 11/05/2014  . Carpal tunnel syndrome 11/05/2014  . Osteoarthritis 11/05/2014  . Dermatitis, eczematoid 11/05/2014  . Depression, major, recurrent, mild (HCC) 11/05/2014  . Dyslipidemia 11/05/2014  . Gastro-esophageal reflux disease without esophagitis 11/05/2014  . Morbid obesity (HCC) 11/05/2014  . Dysmetabolic syndrome 11/05/2014  . Menopausal and perimenopausal disorder 11/05/2014  . Perennial allergic rhinitis with seasonal variation 11/05/2014  . Seborrhea capitis 11/05/2014  . Acquired trigger finger 11/05/2014  . Urinary incontinence in female 11/05/2014  . Vitamin D deficiency 11/05/2014  . Blood glucose elevated 08/31/2009     Past Surgical History:  Procedure Laterality Date  . ABDOMINAL HYSTERECTOMY    . CATARACT EXTRACTION, BILATERAL    . CESAREAN SECTION    . COLONOSCOPY WITH PROPOFOL N/A 03/15/2016   Procedure: COLONOSCOPY WITH PROPOFOL;  Surgeon: Kieth Brightly, MD;  Location: [facility];  Service: Endoscopy;  Laterality: N/A;  . HALLUX VALGUS LAPIDUS Left 06/16/2015   Procedure: HALLUX VALGUS LAPIDUS;  Surgeon: Gwyneth Revels, DPM;  Location: [facility];  Service: Podiatry;  Laterality: Left;  WITH POPLITEAL  . HAMMER TOE SURGERY Left 06/16/2015   Procedure: HAMMER TOE CORRECTION SECOND TOE LEFT;  Surgeon: Gwyneth Revels, DPM;  Location: [facility];  Service: Podiatry;  Laterality: Left;  prediabetic - on oral meds  . hip replacemanet Right 03/15/2018   Alcus Dad  . MYOMECTOMY    . OOPHORECTOMY    . trigger finger Right 12/15/2014  . Vulva cyst  11/2006    Family History  Problem Relation Age of Onset  . Dementia Mother   . Diabetes Father   . Stroke Father   . Cancer Daughter        Fibroid Tumors  . Diabetes Brother   . Breast cancer Neg Hx     Social History   Tobacco Use  . Smoking status: Never Smoker  . Smokeless tobacco: Never Used  . Tobacco comment: smoking cessation materials not required  Substance Use Topics  . Alcohol use: No    Alcohol/week:  0.0 standard drinks     Current Outpatient Medications:  .  acetaminophen (TYLENOL) 500 MG tablet, Take by mouth. Reported on 11/15/2015, Disp: , Rfl:  .  aspirin EC 81 MG tablet, Take 81 mg by mouth daily., Disp: , Rfl:  .  atorvastatin (LIPITOR) 40 MG tablet, Take 1 tablet (40 mg total) by mouth every evening. for cholesterol, Disp: 90 tablet, Rfl: 1 .  buPROPion (WELLBUTRIN XL) 150 MG 24 hr tablet, Take 1 tablet (150 mg total) by mouth daily before breakfast., Disp: 90 tablet, Rfl: 1 .  Cholecalciferol (VITAMIN D PO), Take 1 capsule by mouth daily. 3000 IU daily, Disp: , Rfl:  .   citalopram (CELEXA) 20 MG tablet, TAKE 1 TABLET BY MOUTH EVERY DAY, Disp: 90 tablet, Rfl: 1 .  Cyanocobalamin (B-12) 1000 MCG SUBL, Place 1 each under the tongue 2 (two) times a week. , Disp: , Rfl:  .  fluticasone (FLONASE) 50 MCG/ACT nasal spray, SPRAY 2 SPRAYS INTO EACH NOSTRIL EVERY DAY, Disp: 48 mL, Rfl: 1 .  glucose blood (ONETOUCH VERIO) test strip, Use as instructed, Disp: 100 each, Rfl: 12 .  loratadine (CLARITIN) 10 MG tablet, TAKE 1 TABLET BY MOUTH EVERY DAY, Disp: 90 tablet, Rfl: 1 .  metFORMIN (GLUCOPHAGE) 500 MG tablet, Take 1 tablet (500 mg total) by mouth 2 (two) times daily., Disp: 180 tablet, Rfl: 1 .  nadolol (CORGARD) 20 MG tablet, Take 1 tablet (20 mg total) by mouth daily., Disp: 90 tablet, Rfl: 1 .  naproxen sodium (ALEVE) 220 MG tablet, Take 220 mg by mouth. PRN occasionally, Disp: , Rfl:  .  Olmesartan-amLODIPine-HCTZ 20-5-12.5 MG TABS, TAKE 1 TABLET BY MOUTH EVERY DAY, Disp: 90 tablet, Rfl: 0 .  triamcinolone cream (KENALOG) 0.1 %, Reported on 07/15/2015, Disp: , Rfl:   Allergies  Allergen Reactions  . Duloxetine     sweat    I personally reviewed {Reviewed:14835} with the patient/caregiver today.   ROS  ***  Objective  There were no vitals filed for this visit.  There is no height or weight on file to calculate BMI.  Physical Exam ***  No results found for this or any previous visit (from the past 2160 hour(s)).  Diabetic Foot Exam: Diabetic Foot Exam - Simple   No data filed    ***  PHQ2/9: Depression screen [facility] 2/9 06/21/2020 03/15/2020 11/04/2019 10/28/2019 07/01/2019  Decreased Interest 1 0 1 0 1  Down, Depressed, Hopeless 1 0 3 0 0  PHQ - 2 Score 2 0 4 0 1  Altered sleeping 0 - 2 - 0  Tired, decreased energy 1 - 1 - 1  Change in appetite 1 - 0 - 1  Feeling bad or failure about yourself  0 - 0 - 0  Trouble concentrating 0 - 0 - 0  Moving slowly or fidgety/restless 0 - 0 - 0  Suicidal thoughts 0 - 0 - 0  PHQ-9 Score 4 - 7 - 3  Difficult  doing work/chores - - Not difficult at all - Not difficult at all  Some recent data might be hidden    phq 9 is {gen pos [id number]} ***  Fall Risk: Fall Risk  06/21/2020 03/15/2020 11/04/2019 10/28/2019 07/01/2019  Falls in the past year? 1 0 0 0 0  Comment - - - - -  Number falls in past yr: 1 0 0 0 0  Injury with Fall? 0 0 0 0 0  Comment - - - - -  Risk  Factor Category  - - - - -  Risk for fall due to : - - - Orthopedic patient -  Risk for fall due to: Comment - - - - -  Follow up - - - Falls prevention discussed Falls evaluation completed   ***   Functional Status Survey:   ***   Assessment & Plan  *** There are no diagnoses linked to this encounter.

## 2020-09-28 ENCOUNTER — Ambulatory Visit: Payer: Medicare PPO | Admitting: Family Medicine

## 2020-10-19 ENCOUNTER — Ambulatory Visit: Payer: Medicare PPO | Admitting: Family Medicine

## 2020-10-19 ENCOUNTER — Encounter: Payer: Self-pay | Admitting: Family Medicine

## 2020-10-19 ENCOUNTER — Other Ambulatory Visit: Payer: Self-pay

## 2020-10-19 VITALS — BP 140/80 | HR 75 | Temp 98.2°F | Resp 16 | Ht 62.0 in | Wt 198.4 lb

## 2020-10-19 DIAGNOSIS — I10 Essential (primary) hypertension: Secondary | ICD-10-CM

## 2020-10-19 DIAGNOSIS — E8881 Metabolic syndrome: Secondary | ICD-10-CM | POA: Diagnosis not present

## 2020-10-19 DIAGNOSIS — F411 Generalized anxiety disorder: Secondary | ICD-10-CM | POA: Diagnosis not present

## 2020-10-19 DIAGNOSIS — E785 Hyperlipidemia, unspecified: Secondary | ICD-10-CM | POA: Diagnosis not present

## 2020-10-19 DIAGNOSIS — F330 Major depressive disorder, recurrent, mild: Secondary | ICD-10-CM | POA: Diagnosis not present

## 2020-10-19 DIAGNOSIS — N179 Acute kidney failure, unspecified: Secondary | ICD-10-CM

## 2020-10-19 DIAGNOSIS — Z5181 Encounter for therapeutic drug level monitoring: Secondary | ICD-10-CM | POA: Diagnosis not present

## 2020-10-19 DIAGNOSIS — E559 Vitamin D deficiency, unspecified: Secondary | ICD-10-CM | POA: Diagnosis not present

## 2020-10-19 DIAGNOSIS — R7303 Prediabetes: Secondary | ICD-10-CM | POA: Diagnosis not present

## 2020-10-19 DIAGNOSIS — R739 Hyperglycemia, unspecified: Secondary | ICD-10-CM

## 2020-10-19 MED ORDER — BUPROPION HCL ER (XL) 150 MG PO TB24: 150.0000 mg | ORAL_TABLET | Freq: Every day | ORAL | 1 refills | Status: DC

## 2020-10-19 MED ORDER — NADOLOL 20 MG PO TABS: 20.0000 mg | ORAL_TABLET | Freq: Every day | ORAL | 1 refills | Status: DC

## 2020-10-19 MED ORDER — ATORVASTATIN CALCIUM 40 MG PO TABS: 40.0000 mg | ORAL_TABLET | Freq: Every evening | ORAL | 1 refills | Status: DC

## 2020-10-19 MED ORDER — METFORMIN HCL 500 MG PO TABS
500.0000 mg | ORAL_TABLET | Freq: Two times a day (BID) | ORAL | 1 refills | Status: DC
Start: 2020-10-19 — End: 2021-03-28

## 2020-10-19 MED ORDER — OLMESARTAN-AMLODIPINE-HCTZ 40-5-12.5 MG PO TABS: 1.0000 | ORAL_TABLET | Freq: Every day | ORAL | 1 refills | Status: DC

## 2020-10-19 NOTE — Patient Instructions (Signed)
I will let you know about BP med dose changes once we talk to your pharmacist  For now continue working on low salt diet and your exercise We will order a sleep study eval Avoid NSAIDs like ibuprofen, aleve, naproxen - take tylenol instead  Use your allergy medications more regularly to see if morning headache improves  How to Take Your Blood Pressure Blood pressure measures how strongly your blood is pressing against the walls of your arteries. Arteries are blood vessels that carry blood from your heartthroughout your body. You can take your blood pressure at home with a machine. You may need to check your blood pressure at home: To check if you have high blood pressure (hypertension). To check your blood pressure over time. To make sure your blood pressure medicine is working. Supplies needed: Blood pressure machine, or monitor. Dining room chair to sit in. Table or desk. Small notebook. Pencil or pen. How to prepare Avoid these things for 30 minutes before checking your blood pressure: Having drinks with caffeine in them, such as coffee or tea. Drinking alcohol. Eating. Smoking. Exercising. Do these things five minutes before checking your blood pressure: Go to the bathroom and pee (urinate). Sit in a dining chair. Do not sit on a soft couch or an armchair. Be quiet. Do not talk. How to take your blood pressure Follow the instructions that came with your machine. If you have a digital blood pressure monitor, these may be the instructions: Sit up straight. Place your feet on the floor. Do not cross your ankles or legs. Rest your left arm at the level of your heart. You may rest it on a table, desk, or chair. Pull up your shirt sleeve. Wrap the blood pressure cuff around the upper part of your left arm. The cuff should be 1 inch (2.5 cm) above your elbow. It is best to wrap the cuff around bare skin. Fit the cuff snugly around your arm. You should be able to place only one  finger between the cuff and your arm. Place the cord so that it rests in the bend of your elbow. Press the power button. Sit quietly while the cuff fills with air and loses air. Write down the numbers on the screen. Wait 2-3 minutes and then repeat steps 1-10. What do the numbers mean? Two numbers make up your blood pressure. The first number is called systolic pressure. The second is called diastolic pressure. An example of a bloodpressure reading is "120 over 80" (or 120/80). If you are an adult and do not have a medical condition, use this guide to findout if your blood pressure is normal: Normal First number: below 120. Second number: below 80. Elevated First number: 120-129. Second number: below 80. Hypertension stage 1 First number: 130-139. Second number: 80-89. Hypertension stage 2 First number: 140 or above. Second number: 90 or above. Your blood pressure is above normal even if only the first or only the secondnumber is above normal. Follow these instructions at home: Medicines Take over-the-counter and prescription medicines only as told by your doctor. Tell your doctor if your medicine is causing side effects. General instructions Check your blood pressure as often as your doctor tells you to. Check your blood pressure at the same time every day. Take your monitor to your next doctor's appointment. Your doctor will: Make sure you are using it correctly. Make sure it is working right. Understand what your blood pressure numbers should be. Keep all follow-up visits as told  by your doctor. This is important. General tips You will need a blood pressure machine, or monitor. Your doctor can suggest a monitor. You can buy one at a drugstore or online. When choosing one: Choose one with an arm cuff. Choose one that wraps around your upper arm. Only one finger should fit between your arm and the cuff. Do not choose one that measures your blood pressure from your wrist or  finger. Where to find more information American Heart Association: www.heart.org Contact a doctor if: Your blood pressure keeps being high. Your blood pressure is suddenly low. Get help right away if: Your first blood pressure number is higher than 180. Your second blood pressure number is higher than 120. Summary Check your blood pressure at the same time every day. Avoid caffeine, alcohol, smoking, and exercise for 30 minutes before checking your blood pressure. Make sure you understand what your blood pressure numbers should be. This information is not intended to replace advice given to you by your health care provider. Make sure you discuss any questions you have with your healthcare provider. Document Revised: 02/25/2020 Document Reviewed: 04/11/2019 Elsevier Patient Education  2022 [employer].

## 2020-10-19 NOTE — Progress Notes (Signed)
Name: Monique Baker   MRN: [id number]    DOB: 08-Jun-1950   Date:10/19/2020       Progress Note  Chief Complaint  Patient presents with   Follow-up     Subjective:   Monique Baker is a 70 y.o. female, presents to clinic for   Hypertension:  BP has been elevated, new home BP monitor she brings in today to have it checked and compared to CMA manual BP readings Home readings 150-170's/90's cuff with her in clinic - similarly elevated readings 158-160/90 on right arm Currently managed on, managed with olmesartan-amlodipine-HCTZ 20-5-12.5 daily in the morning and nadolol 20 mg at bedtime Pt reports good med compliance and denies any SE.   Blood pressure today is elevated -she reports that her blood pressure is normally 120s over 70s. BP Readings from Last 3 Encounters:  10/19/20 140/80  07/28/20 128/82  06/21/20 134/82   Pt denies CP, SOB, exertional sx, LE edema, palpitation, Ha's, visual disturbances, lightheadedness, hypotension, syncope.  Hx of anxiety and MDD - on celexa and wellbutrin Depression screen [facility] 2/9 10/19/2020 06/21/2020 03/15/2020  Decreased Interest 1 1 0  Down, Depressed, Hopeless 1 1 0  PHQ - 2 Score 2 2 0  Altered sleeping 0 0 -  Tired, decreased energy 0 1 -  Change in appetite 0 1 -  Feeling bad or failure about yourself  0 0 -  Trouble concentrating 0 0 -  Moving slowly or fidgety/restless 0 0 -  Suicidal thoughts 0 0 -  PHQ-9 Score 2 4 -  Difficult doing work/chores Not difficult at all - -  Some recent data might be hidden   GAD 7 : Generalized Anxiety Score 10/19/2020 01/14/2018 10/23/2017 12/20/2015  Nervous, Anxious, on Edge 0 0 0 3  Control/stop worrying 1 0 1 1  Worry too much - different things 1 0 1 1  Trouble relaxing 1 0 1 1  Restless 0 0 0 0  Easily annoyed or irritable 1 0 0 1  Afraid - awful might happen 0 0 1 3  Total GAD 7 Score 4 0 4 10  Anxiety Difficulty Somewhat difficult Not difficult at all Not difficult at all Somewhat  difficult  Not put into chart yet - GAD 7 score of 4, PHQ score of 2  Hx of hyperglycemia, obesity - on metformin 500 mg twice daily Lab Results  Component Value Date   HGBA1C 5.6 11/04/2019  Reviewed past labs back to 2015, highest A1C noted was 6.4  AR - on flonase and claritin  Patient mentions that she has a morning headache usually to her left forehead and top of her head upon waking but she does not feel congested, is not taking any medications the headache usually goes away when she gets up and gets moving.        Current Outpatient Medications:    acetaminophen (TYLENOL) 500 MG tablet, Take by mouth. Reported on 11/15/2015, Disp: , Rfl:    aspirin EC 81 MG tablet, Take 81 mg by mouth daily., Disp: , Rfl:    Cholecalciferol (VITAMIN D PO), Take 1 capsule by mouth daily. 3000 IU daily, Disp: , Rfl:    citalopram (CELEXA) 20 MG tablet, TAKE 1 TABLET BY MOUTH EVERY DAY, Disp: 90 tablet, Rfl: 1   Cyanocobalamin (B-12) 1000 MCG SUBL, Place 1 each under the tongue 2 (two) times a week. , Disp: , Rfl:    fluticasone (FLONASE) 50 MCG/ACT nasal spray, SPRAY 2  SPRAYS INTO EACH NOSTRIL EVERY DAY, Disp: 48 mL, Rfl: 1   glucose blood (ONETOUCH VERIO) test strip, Use as instructed, Disp: 100 each, Rfl: 12   loratadine (CLARITIN) 10 MG tablet, TAKE 1 TABLET BY MOUTH EVERY DAY, Disp: 90 tablet, Rfl: 1   Olmesartan-amLODIPine-HCTZ 40-5-12.5 MG TABS, Take 1 tablet by mouth daily., Disp: 90 tablet, Rfl: 1   triamcinolone cream (KENALOG) 0.1 %, Reported on 07/15/2015, Disp: , Rfl:    atorvastatin (LIPITOR) 40 MG tablet, Take 1 tablet (40 mg total) by mouth every evening. for cholesterol, Disp: 90 tablet, Rfl: 1   buPROPion (WELLBUTRIN XL) 150 MG 24 hr tablet, Take 1 tablet (150 mg total) by mouth daily before breakfast., Disp: 90 tablet, Rfl: 1   metFORMIN (GLUCOPHAGE) 500 MG tablet, Take 1 tablet (500 mg total) by mouth 2 (two) times daily., Disp: 180 tablet, Rfl: 1   nadolol (CORGARD) 20 MG  tablet, Take 1 tablet (20 mg total) by mouth daily., Disp: 90 tablet, Rfl: 1  Patient Active Problem List   Diagnosis Date Noted   Osteoarthritis of hip 02/19/2018   Osteopenia of hip 09/04/2016   History of bunionectomy of left great toe 07/15/2015   H/O hammer toe correction 03/15/2015   Arthritis of lumbar spine 01/19/2015   Benign essential HTN 11/05/2014   Carpal tunnel syndrome 11/05/2014   Osteoarthritis 11/05/2014   Dermatitis, eczematoid 11/05/2014   Depression, major, recurrent, mild (HCC) 11/05/2014   Dyslipidemia 11/05/2014   Gastro-esophageal reflux disease without esophagitis 11/05/2014   Morbid obesity (HCC) 11/05/2014   Dysmetabolic syndrome 11/05/2014   Menopausal and perimenopausal disorder 11/05/2014   Perennial allergic rhinitis with seasonal variation 11/05/2014   Seborrhea capitis 11/05/2014   Acquired trigger finger 11/05/2014   Urinary incontinence in female 11/05/2014   Vitamin D deficiency 11/05/2014   Blood glucose elevated 08/31/2009    Past Surgical History:  Procedure Laterality Date   ABDOMINAL HYSTERECTOMY     CATARACT EXTRACTION, BILATERAL     CESAREAN SECTION     COLONOSCOPY WITH PROPOFOL N/A 03/15/2016   Procedure: COLONOSCOPY WITH PROPOFOL;  Surgeon: Kieth Brightly, MD;  Location: [facility];  Service: Endoscopy;  Laterality: N/A;   HALLUX VALGUS LAPIDUS Left 06/16/2015   Procedure: HALLUX VALGUS LAPIDUS;  Surgeon: Gwyneth Revels, DPM;  Location: [facility];  Service: Podiatry;  Laterality: Left;  WITH POPLITEAL   HAMMER TOE SURGERY Left 06/16/2015   Procedure: HAMMER TOE CORRECTION SECOND TOE LEFT;  Surgeon: Gwyneth Revels, DPM;  Location: [facility];  Service: Podiatry;  Laterality: Left;  prediabetic - on oral meds   hip replacemanet Right 03/15/2018   Fayrene Fearing Bowers-Emergeortho   MYOMECTOMY     OOPHORECTOMY     trigger finger Right 12/15/2014   Vulva cyst  11/2006    Family History  Problem Relation Age of  Onset   Dementia Mother    Diabetes Father    Stroke Father    Cancer Daughter        Fibroid Tumors   Diabetes Brother    Breast cancer Neg Hx     Social History   Tobacco Use   Smoking status: Never   Smokeless tobacco: Never   Tobacco comments:    smoking cessation materials not required  Vaping Use   Vaping Use: Never used  Substance Use Topics   Alcohol use: No    Alcohol/week: 0.0 standard drinks   Drug use: No     Allergies  Allergen Reactions  Duloxetine     sweat    Health Maintenance  Topic Date Due   COVID-19 Vaccine (4 - Booster for Pfizer series) 11/04/2020 (Originally 06/05/2020)   Zoster Vaccines- Shingrix (1 of 2) 01/19/2021 (Originally 04/19/2001)   INFLUENZA VACCINE  11/29/2020   MAMMOGRAM  07/26/2021   COLONOSCOPY (Pts 45-40yrs Insurance coverage will need to be confirmed)  03/15/2026   TETANUS/TDAP  07/24/2027   DEXA SCAN  Completed   Hepatitis C Screening  Completed   PNA vac Low Risk Adult  Completed   HPV VACCINES  Aged Out    Chart Review Today: I personally reviewed active problem list, medication list, allergies, family history, social history, health maintenance, notes from last encounter, lab results, imaging with the patient/caregiver today.   Review of Systems  Constitutional: Negative.   HENT: Negative.    Eyes: Negative.   Respiratory: Negative.    Cardiovascular: Negative.   Gastrointestinal: Negative.   Endocrine: Negative.   Genitourinary: Negative.   Musculoskeletal: Negative.   Skin: Negative.   Allergic/Immunologic: Negative.   Neurological: Negative.   Hematological: Negative.   Psychiatric/Behavioral: Negative.    All other systems reviewed and are negative.   Objective:   Vitals:   10/19/20 0851  BP: 140/80  Pulse: 75  Resp: 16  Temp: 98.2 F (36.8 C)  TempSrc: Oral  SpO2: 96%  Weight: 198 lb 6.4 oz (90 kg)  Height: 5\' 2"  (1.575 m)    Body mass index is 36.29 kg/m.  Physical Exam Vitals and  nursing note reviewed.  Constitutional:      General: She is not in acute distress.    Appearance: Normal appearance. She is well-developed. She is obese. She is not ill-appearing, toxic-appearing or diaphoretic.     Interventions: Face mask in place.     Comments: Well-appearing female, younger than stated age, no acute distress, appears slightly anxious  HENT:     Head: Normocephalic and atraumatic.     Right Ear: Tympanic membrane, ear canal and external ear normal. There is no impacted cerumen.     Left Ear: Tympanic membrane, ear canal and external ear normal. There is no impacted cerumen.     Nose: Congestion present.     Right Sinus: No maxillary sinus tenderness or frontal sinus tenderness.     Left Sinus: No maxillary sinus tenderness or frontal sinus tenderness.     Mouth/Throat:     Mouth: Mucous membranes are moist.     Pharynx: Oropharynx is clear. No oropharyngeal exudate or posterior oropharyngeal erythema.  Eyes:     General: Lids are normal. No scleral icterus.       Right eye: No discharge.        Left eye: No discharge.     Conjunctiva/sclera: Conjunctivae normal.     Pupils: Pupils are equal, round, and reactive to light.  Neck:     Trachea: Phonation normal. No tracheal deviation.  Cardiovascular:     Rate and Rhythm: Normal rate and regular rhythm.     Pulses: Normal pulses.          Radial pulses are 2+ on the right side and 2+ on the left side.       Posterior tibial pulses are 2+ on the right side and 2+ on the left side.     Heart sounds: Normal heart sounds. No murmur heard.   No friction rub. No gallop.  Pulmonary:     Effort: Pulmonary effort is normal. No respiratory distress.  Breath sounds: Normal breath sounds. No stridor. No wheezing, rhonchi or rales.  Chest:     Chest wall: No tenderness.  Abdominal:     General: Bowel sounds are normal. There is no distension.     Palpations: Abdomen is soft.  Musculoskeletal:     Right lower leg: No  edema.     Left lower leg: No edema.  Skin:    General: Skin is warm and dry.     Coloration: Skin is not jaundiced or pale.     Findings: No rash.  Neurological:     Mental Status: She is alert.     Motor: No abnormal muscle tone.     Gait: Gait normal.  Psychiatric:        Mood and Affect: Mood normal.        Speech: Speech normal.        Behavior: Behavior normal.        Assessment & Plan:     ICD-10-CM   1. Depression, major, recurrent, mild (HCC)  F33.0 buPROPion (WELLBUTRIN XL) 150 MG 24 hr tablet   PHQ-9 reviewed and negative continue same meds, Wellbutrin refill sent in she has Celexa    2. Vitamin D deficiency  E55.9 COMPLETE METABOLIC PANEL WITH GFR    3. Dysmetabolic syndrome  E88.81 Lipid panel    COMPLETE METABOLIC PANEL WITH GFR    Hemoglobin A1c    metFORMIN (GLUCOPHAGE) 500 MG tablet    4. Dyslipidemia  E78.5 Lipid panel    COMPLETE METABOLIC PANEL WITH GFR    atorvastatin (LIPITOR) 40 MG tablet   Good statin compliance, due for her lipid panel and need refill, no myalgias side effects or concerns    5. Benign essential HTN  I10 COMPLETE METABOLIC PANEL WITH GFR    nadolol (CORGARD) 20 MG tablet    Olmesartan-amLODIPine-HCTZ 40-5-12.5 MG TABS   Blood pressure elevated and uncontrolled, home blood pressure cuff is about 20 points higher than manual blood pressure in clinic, increase olmesartan dose    6. GAD (generalized anxiety disorder)  F41.1 buPROPion (WELLBUTRIN XL) 150 MG 24 hr tablet   GAD-7 reviewed, continue same meds    7. Morbid obesity due to excess calories (HCC)  E66.01 CBC with Differential/Platelet    Lipid panel    COMPLETE METABOLIC PANEL WITH GFR    Hemoglobin A1c   Multiple comorbidities including dyslipidemia, hypertension, prediabetes she has been exercising but unfortunately not losing much weight    8. Encounter for medication monitoring  Z51.81 CBC with Differential/Platelet    Lipid panel    COMPLETE METABOLIC PANEL WITH  GFR    Hemoglobin A1c    9. Prediabetes  R73.03 COMPLETE METABOLIC PANEL WITH GFR    Hemoglobin A1c   Recheck A1c and continue metformin current dose, tolerating well    10. Hyperglycemia  R73.9 metFORMIN (GLUCOPHAGE) 500 MG tablet      BP elevated from pt's normal No change in weight, she is actually down a few lbs, exercising more, no exertional sx, home BP monitor readings are much higher than manual in clinic BP - 20+  Morning HA's to forehead and top of head - pt's normal BP on meds was previously 120/70's - consider sleep study to r/o OSA Continue low salt diet, exercise May want to increase dose olmesartan-amlodipine-HCTZ  -recheck with the pharmacy and patient is unable to take 1/2 pills so a new prescription was sent in with increase the losartan dose and see  amlodipine and HCTZ dose continue beta-blocker Close follow-up with PCP and encouraged patient to come in in 2 to 4 weeks to do BP recheck with nurse Avoid NSAIDS   If blood pressures not improved may want to consider a sleep study to rule out obstructive sleep apnea   Return in about 3 months (around 01/19/2021) for Routine follow-up PCP for BP/HA/sinus recheck.   Danelle Berry, PA-C 10/19/20 6:31 PM

## 2020-10-20 LAB — COMPLETE METABOLIC PANEL WITH GFR
AG Ratio: 1.5 (calc) (ref 1.0–2.5)
ALT: 12 U/L (ref 6–29)
AST: 16 U/L (ref 10–35)
Albumin: 4.1 g/dL (ref 3.6–5.1)
Alkaline phosphatase (APISO): 60 U/L (ref 37–153)
BUN/Creatinine Ratio: 13 (calc) (ref 6–22)
BUN: 15 mg/dL (ref 7–25)
CO2: 32 mmol/L (ref 20–32)
Calcium: 10.1 mg/dL (ref 8.6–10.4)
Chloride: 101 mmol/L (ref 98–110)
Creat: 1.2 mg/dL — ABNORMAL HIGH (ref 0.50–0.99)
GFR, Est African American: 53 mL/min/{1.73_m2} — ABNORMAL LOW (ref >=60)
GFR, Est Non African American: 46 mL/min/{1.73_m2} — ABNORMAL LOW (ref >=60)
Globulin: 2.7 g/dL (calc) (ref 1.9–3.7)
Glucose, Bld: 87 mg/dL (ref 65–99)
Potassium: 3.7 mmol/L (ref 3.5–5.3)
Sodium: 142 mmol/L (ref 135–146)
Total Bilirubin: 1.1 mg/dL (ref 0.2–1.2)
Total Protein: 6.8 g/dL (ref 6.1–8.1)

## 2020-10-20 LAB — CBC WITH DIFFERENTIAL/PLATELET
Absolute Monocytes: 930 cells/uL (ref 200–950)
Basophils Absolute: 37 cells/uL (ref 0–200)
Basophils Relative: 0.4 %
Eosinophils Absolute: 186 cells/uL (ref 15–500)
Eosinophils Relative: 2 %
HCT: 36.6 % (ref 35.0–45.0)
Hemoglobin: 11.8 g/dL (ref 11.7–15.5)
Lymphs Abs: 1479 cells/uL (ref 850–3900)
MCH: 27.3 pg (ref 27.0–33.0)
MCHC: 32.2 g/dL (ref 32.0–36.0)
MCV: 84.7 fL (ref 80.0–100.0)
MPV: 9.8 fL (ref 7.5–12.5)
Monocytes Relative: 10 %
Neutro Abs: 6668 cells/uL (ref 1500–7800)
Neutrophils Relative %: 71.7 %
Platelets: 375 10*3/uL (ref 140–400)
RBC: 4.32 10*6/uL (ref 3.80–5.10)
RDW: 13.2 % (ref 11.0–15.0)
Total Lymphocyte: 15.9 %
WBC: 9.3 10*3/uL (ref 3.8–10.8)

## 2020-10-20 LAB — LIPID PANEL
Cholesterol: 126 mg/dL (ref <200)
HDL: 58 mg/dL (ref >=50)
LDL Cholesterol (Calc): 52 mg/dL (calc)
Non-HDL Cholesterol (Calc): 68 mg/dL (calc) (ref <130)
Total CHOL/HDL Ratio: 2.2 (calc) (ref <5.0)
Triglycerides: 79 mg/dL (ref <150)

## 2020-10-20 LAB — HEMOGLOBIN A1C
Hgb A1c MFr Bld: 5.6 % of total Hgb (ref <5.7)
Mean Plasma Glucose: 114 mg/dL
eAG (mmol/L): 6.3 mmol/L

## 2020-10-20 NOTE — Addendum Note (Signed)
Addended by: Danelle Berry on: 10/20/2020 12:24 PM   Modules accepted: Orders

## 2020-10-28 ENCOUNTER — Ambulatory Visit (INDEPENDENT_AMBULATORY_CARE_PROVIDER_SITE_OTHER): Payer: Medicare PPO

## 2020-10-28 DIAGNOSIS — Z0000 Encounter for general adult medical examination without abnormal findings: Secondary | ICD-10-CM | POA: Diagnosis not present

## 2020-10-28 NOTE — Patient Instructions (Signed)
Monique Baker , Thank you for taking time to come for your Medicare Wellness Visit. I appreciate your ongoing commitment to your health goals. Please review the following plan we discussed and let me know if I can assist you in the future.   Screening recommendations/referrals: Colonoscopy: done 03/15/16. Repeat in 2027 Mammogram: done 07/26/20 Bone Density: done 09/04/16 Recommended yearly ophthalmology/optometry visit for glaucoma screening and checkup Recommended yearly dental visit for hygiene and checkup  Vaccinations: Influenza vaccine: done 03/15/20 Pneumococcal vaccine: done 07/23/17 Tdap vaccine: done 07/23/17 Shingles vaccine: Shingrix discussed. Please contact your pharmacy for coverage information.  Covid-19:done 06/23/19, 07/14/19, 02/03/20 & 08/27/20  Advanced directives: Advance directive discussed with you today. I have provided a copy for you to complete at home and have notarized. Once this is complete please bring a copy in to our office so we can scan it into your chart.   Conditions/risks identified: Keep up the great work!  Next appointment: Follow up in one year for your annual wellness visit    Preventive Care 65 Years and Older, Female \\Preventive  care refers to lifestyle choices and visits with your health care provider that can promote health and wellness. What does preventive care include? A yearly physical exam. This is also called an annual well check. Dental exams once or twice a year. Routine eye exams. Ask your health care provider how often you should have your eyes checked. Personal lifestyle choices, including: Daily care of your teeth and gums. Regular physical activity. Eating a healthy diet. Avoiding tobacco and drug use. Limiting alcohol use. Practicing safe sex. Taking low-dose aspirin every day. Taking vitamin and mineral supplements as recommended by your health care provider. What happens during an annual well check? The services and screenings  done by your health care provider during your annual well check will depend on your age, overall health, lifestyle risk factors, and family history of disease. Counseling  Your health care provider may ask you questions about your: Alcohol use. Tobacco use. Drug use. Emotional well-being. Home and relationship well-being. Sexual activity. Eating habits. History of falls. Memory and ability to understand (cognition). Work and work Astronomer. Reproductive health. Screening  You may have the following tests or measurements: Height, weight, and BMI. Blood pressure. Lipid and cholesterol levels. These may be checked every 5 years, or more frequently if you are over 59 years old. Skin check. Lung cancer screening. You may have this screening every year starting at age 46 if you have a 30-pack-year history of smoking and currently smoke or have quit within the past 15 years. Fecal occult blood test (FOBT) of the stool. You may have this test every year starting at age 75. Flexible sigmoidoscopy or colonoscopy. You may have a sigmoidoscopy every 5 years or a colonoscopy every 10 years starting at age 34. Hepatitis C blood test. Hepatitis B blood test. Sexually transmitted disease (STD) testing. Diabetes screening. This is done by checking your blood sugar (glucose) after you have not eaten for a while (fasting). You may have this done every 1-3 years. Bone density scan. This is done to screen for osteoporosis. You may have this done starting at age 25. Mammogram. This may be done every 1-2 years. Talk to your health care provider about how often you should have regular mammograms. Talk with your health care provider about your test results, treatment options, and if necessary, the need for more tests. Vaccines  Your health care provider may recommend certain vaccines, such as: Influenza vaccine. This  is recommended every year. Tetanus, diphtheria, and acellular pertussis (Tdap, Td)  vaccine. You may need a Td booster every 10 years. Zoster vaccine. You may need this after age 49. Pneumococcal 13-valent conjugate (PCV13) vaccine. One dose is recommended after age 68. Pneumococcal polysaccharide (PPSV23) vaccine. One dose is recommended after age 69. Talk to your health care provider about which screenings and vaccines you need and how often you need them. This information is not intended to replace advice given to you by your health care provider. Make sure you discuss any questions you have with your health care provider. Document Released: 05/14/2015 Document Revised: 01/05/2016 Document Reviewed: 02/16/2015 Elsevier Interactive Patient Education  2017 [place] Prevention in the Home Falls can cause injuries. They can happen to people of all ages. There are many things you can do to make your home safe and to help prevent falls. What can I do on the outside of my home? Regularly fix the edges of walkways and driveways and fix any cracks. Remove anything that might make you trip as you walk through a door, such as a raised step or threshold. Trim any bushes or trees on the path to your home. Use bright outdoor lighting. Clear any walking paths of anything that might make someone trip, such as rocks or tools. Regularly check to see if handrails are loose or broken. Make sure that both sides of any steps have handrails. Any raised decks and porches should have guardrails on the edges. Have any leaves, snow, or ice cleared regularly. Use sand or salt on walking paths during winter. Clean up any spills in your garage right away. This includes oil or grease spills. What can I do in the bathroom? Use night lights. Install grab bars by the toilet and in the tub and shower. Do not use towel bars as grab bars. Use non-skid mats or decals in the tub or shower. If you need to sit down in the shower, use a plastic, non-slip stool. Keep the floor dry. Clean up any  water that spills on the floor as soon as it happens. Remove soap buildup in the tub or shower regularly. Attach bath mats securely with double-sided non-slip rug tape. Do not have throw rugs and other things on the floor that can make you trip. What can I do in the bedroom? Use night lights. Make sure that you have a light by your bed that is easy to reach. Do not use any sheets or blankets that are too big for your bed. They should not hang down onto the floor. Have a firm chair that has side arms. You can use this for support while you get dressed. Do not have throw rugs and other things on the floor that can make you trip. What can I do in the kitchen? Clean up any spills right away. Avoid walking on wet floors. Keep items that you use a lot in easy-to-reach places. If you need to reach something above you, use a strong step stool that has a grab bar. Keep electrical cords out of the way. Do not use floor polish or wax that makes floors slippery. If you must use wax, use non-skid floor wax. Do not have throw rugs and other things on the floor that can make you trip. What can I do with my stairs? Do not leave any items on the stairs. Make sure that there are handrails on both sides of the stairs and use them. Fix handrails that  are broken or loose. Make sure that handrails are as long as the stairways. Check any carpeting to make sure that it is firmly attached to the stairs. Fix any carpet that is loose or worn. Avoid having throw rugs at the top or bottom of the stairs. If you do have throw rugs, attach them to the floor with carpet tape. Make sure that you have a light switch at the top of the stairs and the bottom of the stairs. If you do not have them, ask someone to add them for you. What else can I do to help prevent falls? Wear shoes that: Do not have high heels. Have rubber bottoms. Are comfortable and fit you well. Are closed at the toe. Do not wear sandals. If you use a  stepladder: Make sure that it is fully opened. Do not climb a closed stepladder. Make sure that both [place] of the stepladder are locked into place. Ask someone to hold it for you, if possible. Clearly mark and make sure that you can see: Any grab bars or handrails. First and last steps. Where the edge of each step is. Use tools that help you move around (mobility aids) if they are needed. These include: Canes. Walkers. Scooters. Crutches. Turn on the lights when you go into a dark area. Replace any light bulbs as soon as they burn out. Set up your furniture so you have a clear path. Avoid moving your furniture around. If any of your floors are uneven, fix them. If there are any pets around you, be aware of where they are. Review your medicines with your doctor. Some medicines can make you feel dizzy. This can increase your chance of falling. Ask your doctor what other things that you can do to help prevent falls. This information is not intended to replace advice given to you by your health care provider. Make sure you discuss any questions you have with your health care provider. Document Released: 02/11/2009 Document Revised: 09/23/2015 Document Reviewed: 05/22/2014 Elsevier Interactive Patient Education  2017 [employer].

## 2020-10-28 NOTE — Progress Notes (Signed)
Subjective:   Monique Baker is a 70 y.o. female who presents for Medicare Annual (Subsequent) preventive examination.  Virtual Visit via Telephone Note  I connected with  Dayton Scrape on 10/28/20 at 10:00 AM EDT by telephone and verified that I am speaking with the correct person using two identifiers.  Location: Patient: home Provider: [facility] Persons participating in the virtual visit: patient/Nurse Health Advisor   I discussed the limitations, risks, security and privacy concerns of performing an evaluation and management service by telephone and the availability of in person appointments. The patient expressed understanding and agreed to proceed.  Interactive audio and video telecommunications were attempted between this nurse and patient, however failed, due to patient having technical difficulties OR patient did not have access to video capability.  We continued and completed visit with audio only.  Some vital signs may be absent or patient reported.   Reather Littler, LPN   Review of Systems     Cardiac Risk Factors include: advanced age (>42men, >72 women);obesity (BMI >30kg/m2);hypertension;dyslipidemia     Objective:    There were no vitals filed for this visit. There is no height or weight on file to calculate BMI.  Advanced Directives 10/28/2020 10/28/2019 10/22/2018 10/12/2017 03/28/2017 01/18/2017 11/14/2016  Does Patient Have a Medical Advance Directive? No No No No No No No  Does patient want to make changes to medical advance directive? Yes (MAU/Ambulatory/Procedural Areas - Information given) - - - - - -  Would patient like information on creating a medical advance directive? - Yes (MAU/Ambulatory/Procedural Areas - Information given) Yes (MAU/Ambulatory/Procedural Areas - Information given) Yes (MAU/Ambulatory/Procedural Areas - Information given) No - Patient declined - -    Current Medications (verified) Outpatient Encounter Medications as of 10/28/2020  Medication  Sig   acetaminophen (TYLENOL) 500 MG tablet Take by mouth. Reported on 11/15/2015   aspirin EC 81 MG tablet Take 81 mg by mouth daily.   atorvastatin (LIPITOR) 40 MG tablet Take 1 tablet (40 mg total) by mouth every evening. for cholesterol   buPROPion (WELLBUTRIN XL) 150 MG 24 hr tablet Take 1 tablet (150 mg total) by mouth daily before breakfast.   Cholecalciferol (VITAMIN D PO) Take 1 capsule by mouth daily. 3000 IU daily   citalopram (CELEXA) 20 MG tablet TAKE 1 TABLET BY MOUTH EVERY DAY   Cyanocobalamin (B-12) 1000 MCG SUBL Place 1 each under the tongue 2 (two) times a week.    fluticasone (FLONASE) 50 MCG/ACT nasal spray SPRAY 2 SPRAYS INTO EACH NOSTRIL EVERY DAY   glucose blood (ONETOUCH VERIO) test strip Use as instructed   loratadine (CLARITIN) 10 MG tablet TAKE 1 TABLET BY MOUTH EVERY DAY   metFORMIN (GLUCOPHAGE) 500 MG tablet Take 1 tablet (500 mg total) by mouth 2 (two) times daily.   nadolol (CORGARD) 20 MG tablet Take 1 tablet (20 mg total) by mouth daily.   Olmesartan-amLODIPine-HCTZ 40-5-12.5 MG TABS Take 1 tablet by mouth daily.   triamcinolone cream (KENALOG) 0.1 % Reported on 07/15/2015   No facility-administered encounter medications on file as of 10/28/2020.    Allergies (verified) Duloxetine   History: Past Medical History:  Diagnosis Date   Allergy    Depression    GERD (gastroesophageal reflux disease)    Hyperlipidemia    Hypertension    Internal hemorrhoids    Menopausal disorder    Osteoarthritis    hands   Prediabetes    Wears dentures    partial upper   Past  Surgical History:  Procedure Laterality Date   ABDOMINAL HYSTERECTOMY     CATARACT EXTRACTION, BILATERAL     CESAREAN SECTION     COLONOSCOPY WITH PROPOFOL N/A 03/15/2016   Procedure: COLONOSCOPY WITH PROPOFOL;  Surgeon: Kieth Brightly, MD;  Location: [facility] END[facility]COPY;  Service: Endoscopy;  Laterality: N/A;   HALLUX VALGUS LAPIDUS Left 06/16/2015   Procedure: HALLUX VALGUS LAPIDUS;   Surgeon: Gwyneth Revels, DPM;  Location: [facility];  Service: Podiatry;  Laterality: Left;  WITH POPLITEAL   HAMMER TOE SURGERY Left 06/16/2015   Procedure: HAMMER TOE CORRECTION SECOND TOE LEFT;  Surgeon: Gwyneth Revels, DPM;  Location: [facility];  Service: Podiatry;  Laterality: Left;  prediabetic - on oral meds   hip replacemanet Right 03/15/2018   Fayrene Fearing Bowers-[place]mergeortho   MYOMECTOMY     OOPHORECTOMY     trigger finger Right 12/15/2014   Vulva cyst  11/2006   Family History  Problem Relation Age of Onset   Dementia Mother    Diabetes Father    Stroke Father    Cancer Daughter        Fibroid Tumors   Diabetes Brother    Breast cancer Neg Hx    Social History   Socioeconomic History   Marital status: Married    Spouse name: Adela Glimpse   Number of children: 1   Years of education: Not on file   Highest education level: Bachelor's degree (e.g., BA, AB, BS)  Occupational History   Occupation: retired Runner, broadcasting/film/video    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 [place]  Tobacco Use   Smoking status: Never   Smokeless tobacco: Never   Tobacco comments:    smoking cessation materials not required  Vaping Use   Vaping Use: Never used  Substance and Sexual Activity   Alcohol use: No    Alcohol/week: 0.0 standard drinks   Drug use: No   Sexual activity: Not Currently    Partners: Male  Other Topics Concern   Not on file  Social History Narrative   Married to a Education officer, environmental and he is a professor at [employer] Armed forces training and education officer )    Social Determinants of Corporate investment banker Strain: Low Risk    Difficulty of Paying Living Expenses: Not hard at all  Food Insecurity: No Food Insecurity   Worried About Programme researcher, broadcasting/film/video in the Last Year: Never true   Barista in the Last Year: Never true  Transportation Needs: No Transportation Needs   Lack of Transportation (Medical): No   Lack of Transportation (Non-Medical): No  Physical Activity: Sufficiently Active   Days of Exercise per Week: 4 days    Minutes of Exercise per Session: 120 min  Stress: No Stress Concern Present   Feeling of Stress : Only a little  Social Connections: Press photographer of Communication with Friends and Family: More than three times a week   Frequency of Social Gatherings with Friends and Family: Three times a week   Attends Religious Services: More than 4 times per year   Active Member of Clubs or Organizations: Yes   Attends Engineer, structural: More than 4 times per year   Marital Status: Married    Tobacco Counseling Counseling given: Not Answered Tobacco comments: smoking cessation materials not required   Clinical Intake:  Pre-visit preparation completed: Yes  Pain : No/denies pain     Nutritional Risks: None Diabetes: No  How often do you need to have someone help you  when you read instructions, pamphlets, or other written materials from your doctor or pharmacy?: 1 - Never    Interpreter Needed?: No  Information entered by :: Reather Littler LPN   Activities of Daily Living In your present state of health, do you have any difficulty performing the following activities: 10/28/2020 10/19/2020  Hearing? N N  Comment declines hearing aids -  Vision? N N  Difficulty concentrating or making decisions? N N  Walking or climbing stairs? N N  Dressing or bathing? N N  Doing errands, shopping? N N  Preparing Food and eating ? N -  Using the Toilet? N -  In the past six months, have you accidently leaked urine? N -  Do you have problems with loss of bowel control? N -  Managing your Medications? N -  Managing your Finances? N -  Housekeeping or managing your Housekeeping? N -  Some recent data might be hidden    Patient Care Team: Alba Cory, MD as PCP - General (Family Medicine) Kieth Brightly, MD (General Surgery) Deeann Saint, MD as Consulting Physician (Orthopedic Surgery) Alwyn Pea, MD as Consulting Physician  (Cardiology)  Indicate any recent Medical Services you may have received from other than [facility] providers in the past year (date may be approximate).     Assessment:   This is a routine wellness examination for [place].  Hearing/Vision screen Hearing Screening - Comments:: Pt denies hearing difficulty  Vision Screening - Comments:: [facility]  Dietary issues and exercise activities discussed: Current Exercise Habits: Home exercise routine, Type of exercise: strength training/weights;calisthenics, Time (Minutes): > 60, Frequency (Times/Week): 4, Weekly Exercise (Minutes/Week): 0, Intensity: Moderate, Exercise limited by: None identified   Goals Addressed             This Visit's Progress    DIET - INCREASE WATER INTAKE   On track    Recommend to drink at least 6-8 8oz glasses of water per day.        Depression Screen PHQ 2/9 Scores 10/28/2020 10/19/2020 06/21/2020 03/15/2020 11/04/2019 10/28/2019 07/01/2019  PHQ - 2 Score 2 2 2  0 4 0 1  PHQ- 9 Score 2 2 4  - 7 - 3    Fall Risk Fall Risk  10/28/2020 10/19/2020 06/21/2020 03/15/2020 11/04/2019  Falls in the past year? 1 1 1  0 0  Comment - - - - -  Number falls in past yr: 1 1 1  0 0  Injury with Fall? 0 0 0 0 0  Comment - - - - -  Risk Factor Category  - - - - -  Risk for fall due to : History of fall(s) History of fall(s) - - -  Risk for fall due to: Comment - - - - -  Follow up Falls prevention discussed Falls prevention discussed - - -    FALL RISK PREVENTION PERTAINING TO THE HOME:  Any stairs in or around the home? Yes  If so, are there any without handrails? No  Home free of loose throw rugs in walkways, pet beds, electrical cords, etc? Yes  Adequate lighting in your home to reduce risk of falls? Yes   ASSISTIVE DEVICES UTILIZED TO PREVENT FALLS:  Life alert? No  Use of a cane, walker or w/c? No  Grab bars in the bathroom? Yes  Shower chair or bench in shower? Yes  Elevated toilet seat  or a handicapped toilet? No   TIMED UP AND GO:  Was the test performed? No . Telephonic visit.   Cognitive Function: Normal cognitive status assessed by direct observation by this Nurse Health Advisor. No abnormalities found.       6CIT Screen 10/28/2019 10/22/2018 10/12/2017  What Year? 0 points 0 points 0 points  What month? 0 points 0 points 0 points  What time? 0 points 0 points 0 points  Count back from 20 0 points 0 points 0 points  Months in reverse 0 points 0 points 0 points  Repeat phrase 0 points 2 points 0 points  Total Score 0 2 0    Immunizations Immunization History  Administered Date(s) Administered   Fluad Quad(high Dose 65+) 02/19/2019   Influenza, High Dose Seasonal PF 01/18/2017, 01/14/2018   Influenza,inj,Quad PF,6+ Mos 01/11/2015, 01/17/2016, 03/15/2020   Influenza-Unspecified 01/06/2014   PFIZER Comirnaty(Gray Top)Covid-19 Tri-Sucrose Vaccine 08/27/2020   PFIZER(Purple Top)SARS-COV-2 Vaccination 06/23/2019, 07/14/2019, 02/03/2020   Pneumococcal Conjugate-13 07/23/2017   Pneumococcal Polysaccharide-23 08/31/2009   Tdap 05/10/2010, 07/23/2017   Zoster, Live 12/28/2011    TDAP status: Up to date  Flu Vaccine status: Up to date  Pneumococcal vaccine status: Up to date  Covid-19 vaccine status: Completed vaccines  Qualifies for Shingles Vaccine? Yes   Zostavax completed Yes   Shingrix Completed?: No.    Education has been provided regarding the importance of this vaccine. Patient has been advised to call insurance company to determine out of pocket expense if they have not yet received this vaccine. Advised may also receive vaccine at local pharmacy or Health Dept. Verbalized acceptance and understanding.  Screening Tests Health Maintenance  Topic Date Due   Zoster Vaccines- Shingrix (1 of 2) 01/19/2021 (Originally 04/19/2001)   INFLUENZA VACCINE  11/29/2020   MAMMOGRAM  07/26/2021   COLONOSCOPY (Pts 45-50yrs Insurance coverage will need to be  confirmed)  03/15/2026   TETANUS/TDAP  07/24/2027   DEXA SCAN  Completed   COVID-19 Vaccine  Completed   Hepatitis C Screening  Completed   PNA vac Low Risk Adult  Completed   HPV VACCINES  Aged Out    Health Maintenance  There are no preventive care reminders to display for this patient.  Colorectal cancer screening: Type of screening: Colonoscopy. Completed 03/15/16. Repeat every 10 years  Mammogram status: Completed 07/26/20. Repeat every year  Bone Density status: Completed 09/04/16. Results reflect: Bone density results: OSTEOPENIA. Repeat every 2 years. Pt declines repeat screening at this time.   Lung Cancer Screening: (Low Dose CT Chest recommended if Age 44-80 years, 30 pack-year currently smoking OR have quit w/in 15years.) does not qualify.   Additional Screening:  Hepatitis C Screening: does qualify; Completed 12/28/11  Vision Screening: Recommended annual ophthalmology exams for early detection of glaucoma and other disorders of the eye. Is the patient up to date with their annual eye exam?  Yes  Who is the provider or what is the name of the office in which the patient attends annual eye exams? [facility].   Dental Screening: Recommended annual dental exams for proper oral hygiene  Community Resource Referral / Chronic Care Management: CRR required this visit?  No   CCM required this visit?  No      Plan:     I have personally reviewed and noted the following in the patient's chart:   Medical and social history Use of alcohol, tobacco or illicit drugs  Current medications and supplements including opioid prescriptions.  Functional ability and status Nutritional status Physical activity Advanced directives List of  other physicians Hospitalizations, surgeries, and ER visits in previous 12 months Vitals Screenings to include cognitive, depression, and falls Referrals and appointments  In addition, I have reviewed and discussed with patient certain  preventive protocols, quality metrics, and best practice recommendations. A written personalized care plan for preventive services as well as general preventive health recommendations were provided to patient.     Reather LittlerKasey Alireza Pollack, LPN   1/61/09606/30/2022   Nurse Notes: none

## 2020-11-22 ENCOUNTER — Other Ambulatory Visit: Payer: Self-pay | Admitting: Family Medicine

## 2020-11-22 DIAGNOSIS — I10 Essential (primary) hypertension: Secondary | ICD-10-CM

## 2020-12-16 ENCOUNTER — Other Ambulatory Visit: Payer: Self-pay | Admitting: Family Medicine

## 2020-12-16 DIAGNOSIS — F330 Major depressive disorder, recurrent, mild: Secondary | ICD-10-CM

## 2020-12-16 DIAGNOSIS — F411 Generalized anxiety disorder: Secondary | ICD-10-CM

## 2020-12-16 DIAGNOSIS — I10 Essential (primary) hypertension: Secondary | ICD-10-CM

## 2020-12-16 NOTE — Telephone Encounter (Signed)
Notes to clinic:  The original prescription was discontinued on 10/19/2020 by Danelle Berry, PA-C for the following reason: Dose change. Renewing this prescription may not be appropriate   Requested Prescriptions  Pending Prescriptions Disp Refills   Olmesartan-amLODIPine-HCTZ 20-5-12.5 MG TABS [Pharmacy Med Name: OLMSRTN-AMLDPN-HCTZ 20-5-12.5] 90 tablet 0    Sig: TAKE 1 TABLET BY MOUTH EVERY DAY     Cardiovascular: CCB + ARB + Diuretic Combos Failed - 12/16/2020 10:42 AM      Failed - Cr in normal range and within 180 days    Creat  Date Value Ref Range Status  10/19/2020 1.20 (H) 0.50 - 0.99 mg/dL Final    Comment:    For patients >42 years of age, the reference limit for Creatinine is approximately 13% higher for people identified as African-American. .           Failed - Last BP in normal range    BP Readings from Last 1 Encounters:  10/19/20 140/80          Passed - K in normal range and within 180 days    Potassium  Date Value Ref Range Status  10/19/2020 3.7 3.5 - 5.3 mmol/L Final          Passed - Na in normal range and within 180 days    Sodium  Date Value Ref Range Status  10/19/2020 142 135 - 146 mmol/L Final  11/09/2014 142 134 - 144 mmol/L Final          Passed - Ca in normal range and within 180 days    Calcium  Date Value Ref Range Status  10/19/2020 10.1 8.6 - 10.4 mg/dL Final          Passed - Patient is not pregnant      Passed - Last Heart Rate in normal range    Pulse Readings from Last 1 Encounters:  10/19/20 75          Passed - Valid encounter within last 6 months    Recent Outpatient Visits           1 month ago Depression, major, recurrent, mild (HCC)   [facility], [place], PA-C   5 months ago Depression, major, recurrent, mild [facility])   [facility] Alba Cory, MD   9 months ago Morbid obesity [facility])   [facility] Alba Cory, MD   1 year ago Benign  essential HTN   [facility] Alba Cory, MD   1 year ago Benign essential HTN   [facility] Alba Cory, MD       Future Appointments             In 1 month Alba Cory, MD [facility], [facility]   In 6 months Alba Cory, MD [facility], [facility]   In 10 months  [facility]             citalopram (CELEXA) 20 MG tablet [Pharmacy Med Name: CITALOPRAM HBR 20 MG TABLET] 90 tablet 1    Sig: TAKE 1 TABLET BY MOUTH EVERY DAY     Psychiatry:  Antidepressants - SSRI Passed - 12/16/2020 10:42 AM      Passed - Completed PHQ-2 or PHQ-9 in the last 360 days      Passed - Valid encounter within last 6 months    Recent Outpatient Visits  1 month ago Depression, major, recurrent, mild [facility])   [facility] [place], [place], PA-C   5 months ago Depression, major, recurrent, mild [facility])   [facility] Alba Cory, MD   9 months ago Morbid obesity [facility])   [facility] Alba Cory, MD   1 year ago Benign essential HTN   [facility] Alba Cory, MD   1 year ago Benign essential HTN   [facility] Alba Cory, MD       Future Appointments             In 1 month Alba Cory, MD [facility], [facility]   In 6 months Alba Cory, MD [facility], [facility]   In 10 months  [facility]

## 2020-12-16 NOTE — Telephone Encounter (Signed)
Next appt is 01/26/21 

## 2020-12-16 NOTE — Telephone Encounter (Signed)
Last seen 6.21.2022 upcoming sch'd for 9.28.2022

## 2021-01-25 NOTE — Progress Notes (Signed)
Name: Monique Baker   MRN: [id number]    DOB: 08/28/1950   Date:01/26/2021       Progress Note  Subjective  Chief Complaint  Follow Up  HPI  Depression Major: she is married to a Education officer, environmental, he also works as a Airline pilot at [employer]. We started her on Cymbalta 10/2015 was doing better but caused hair loss, so she is now on Citalopram and Wellbutrin XL. She states she still misses her brother - he died in Aug 31, 2019 She has noticed lack of motivation, her husband is always busy and still does not have time for her. She is going to the gym and is really helping her mood   Thick toenails: both feet but has more pain on right 2nd and 3 rd toes, she was seen by Dr. Ether Griffins and was advised to keep it moisturized and get pedicure   Pre diabetes/metabolic syndrome: she is taking Metformin, denies side effects, hgbA1C has been stable at 5.6 %  she denies  polyphagia, polydipsia or polyuria. She stopped Weight Watchers but is back again, she states not very consistent with her diet but has been going to the gym ore often    Right hip pain/OA: she had right hip replacement back in Nov 2019 by Dr. Odis Luster She is now pain free, no longer using a cane , she missed last visit.    Morbid obesity:   she joined weight watchers 05/2016 at a weight of 224 lbs  Weight has been stable for the  past couple of years.  Following Weight Watchers loosely lately, having difficulty planning her meals. Weight has been around 200 lbs lately and stable. She has been going to the gym 4 times a week.    HTN: taking bp medication, denies side effects, no chest pain but since this Summer her bp started to go up, she was seen by Danelle Berry and Tribenzor dose was adjusted from 20/5/12.5 mg to 40/5/12.5 and improved for a period of time however it is trending up again and she is concerned, she is a patient of Dr. Juliann Pares, we will adjust medication today but explained she needs to follow up with Dr. Juliann Pares to find out the reason for bp  elevation we will also check labs to rule out other causes , her GFR dropped during her last labs in June. Discussed importance of stopping NSAID's . We will adjust dose of Nadolol to 40 mg    AR: over the past 4 weeks she has noticed increase in nasal drainage , worse at night and causes her to cough , no wheezing or SOB, has a tickle in her throat   Patient Active Problem List   Diagnosis Date Noted   Osteoarthritis of hip 02/19/2018   Osteopenia of hip 09/04/2016   History of bunionectomy of left great toe 07/15/2015   H/O hammer toe correction 03/15/2015   Arthritis of lumbar spine 01/19/2015   Benign essential HTN 11/05/2014   Carpal tunnel syndrome 11/05/2014   Osteoarthritis 11/05/2014   Dermatitis, eczematoid 11/05/2014   Depression, major, recurrent, mild (HCC) 11/05/2014   Dyslipidemia 11/05/2014   Gastro-esophageal reflux disease without esophagitis 11/05/2014   Morbid obesity (HCC) 11/05/2014   Dysmetabolic syndrome 11/05/2014   Menopausal and perimenopausal disorder 11/05/2014   Perennial allergic rhinitis with seasonal variation 11/05/2014   Seborrhea capitis 11/05/2014   Acquired trigger finger 11/05/2014   Urinary incontinence in female 11/05/2014   Vitamin D deficiency 11/05/2014   Blood glucose elevated 08/31/2009  Past Surgical History:  Procedure Laterality Date   ABDOMINAL HYSTERECTOMY     CATARACT EXTRACTION, BILATERAL     CESAREAN SECTION     COLONOSCOPY WITH PROPOFOL N/A 03/15/2016   Procedure: COLONOSCOPY WITH PROPOFOL;  Surgeon: Kieth Brightly, MD;  Location: [facility] END[facility]COPY;  Service: Endoscopy;  Laterality: N/A;   HALLUX VALGUS LAPIDUS Left 06/16/2015   Procedure: HALLUX VALGUS LAPIDUS;  Surgeon: Gwyneth Revels, DPM;  [facility];  Service: Podiatry;  Laterality: Left;  WITH POPLITEAL   HAMMER TOE SURGERY Left 06/16/2015   Procedure: HAMMER TOE CORRECTION SECOND TOE LEFT;  Surgeon: Gwyneth Revels, DPM;  Location[facility];  Service: Podiatry;  Laterality: Left;  prediabetic - on oral meds   hip replacemanet Right 03/15/2018   [facility]   MYOMECTOMY     OOPHORECTOMY     trigger finger Right 12/15/2014   Vulva cyst  11/2006    Family History  Problem Relation Age of Onset   Dementia Mother    Diabetes Father    Stroke Father    Cancer Daughter        Fibroid Tumors   Diabetes Brother    Breast cancer Neg Hx     Social History   Tobacco Use   Smoking status: Never   Smokeless tobacco: Never   Tobacco comments:    smoking cessation materials not required  Substance Use Topics   Alcohol use: No    Alcohol/week: 0.0 standard drinks     Current Outpatient Medications:    acetaminophen (TYLENOL) 500 MG tablet, Take by mouth. Reported on 11/15/2015, Disp: , Rfl:    aspirin EC 81 MG tablet, Take 81 mg by mouth daily., Disp: , Rfl:    atorvastatin (LIPITOR) 40 MG tablet, Take 1 tablet (40 mg total) by mouth every evening. for cholesterol, Disp: 90 tablet, Rfl: 1   buPROPion (WELLBUTRIN XL) 150 MG 24 hr tablet, Take 1 tablet (150 mg total) by mouth daily before breakfast., Disp: 90 tablet, Rfl: 1   Cholecalciferol (VITAMIN D PO), Take 1 capsule by mouth daily. 3000 IU daily, Disp: , Rfl:    citalopram (CELEXA) 20 MG tablet, TAKE 1 TABLET BY MOUTH EVERY DAY, Disp: 90 tablet, Rfl: 1   Cyanocobalamin (B-12) 1000 MCG SUBL, Place 1 each under the tongue 2 (two) times a week. , Disp: , Rfl:    fluticasone (FLONASE) 50 MCG/ACT nasal spray, SPRAY 2 SPRAYS INTO EACH NOSTRIL EVERY DAY, Disp: 48 mL, Rfl: 1   glucose blood (ONETOUCH VERIO) test strip, Use as instructed, Disp: 100 each, Rfl: 12   loratadine (CLARITIN) 10 MG tablet, TAKE 1 TABLET BY MOUTH EVERY DAY, Disp: 90 tablet, Rfl: 1   metFORMIN (GLUCOPHAGE) 500 MG tablet, Take 1 tablet (500 mg total) by mouth 2 (two) times daily., Disp: 180 tablet, Rfl: 1   nadolol (CORGARD) 20 MG tablet, Take 1 tablet (20 mg total) by mouth daily., Disp:  90 tablet, Rfl: 1   Olmesartan-amLODIPine-HCTZ 40-5-12.5 MG TABS, Take 1 tablet by mouth daily., Disp: 90 tablet, Rfl: 1   triamcinolone cream (KENALOG) 0.1 %, Reported on 07/15/2015, Disp: , Rfl:   Allergies  Allergen Reactions   Duloxetine     sweat    I personally reviewed active problem list, medication list, allergies, family history, social history, health maintenance with the patient/caregiver today.   ROS  Constitutional: Negative for fever or weight change.  Respiratory:positive for cough but no  shortness of breath.  Having  post-nasal drainage and tickles down her throat  Cardiovascular: Negative for chest pain or palpitations.  Gastrointestinal: Negative for abdominal pain, no bowel changes.  Musculoskeletal: Negative for gait problem or joint swelling.  Skin: Negative for rash.  Neurological: Negative for dizziness or headache.  No other specific complaints in a complete review of systems (except as listed in HPI above).   Objective  Vitals:   01/26/21 1021  BP: 140/78  Pulse: 80  Resp: 16  Temp: 98.2 F (36.8 C)  SpO2: 99%  Weight: 202 lb (91.6 kg)  Height: 5\' 2"  (1.575 m)    Body mass index is 36.95 kg/m.  Physical Exam  Constitutional: Patient appears well-developed and well-nourished. Obese  No distress.  HEENT: head atraumatic, normocephalic, pupils equal and reactive to light, neck supple Cardiovascular: Normal rate, regular rhythm and normal heart sounds.  No murmur heard. No BLE edema. Pulmonary/Chest: Effort normal and breath sounds normal. No respiratory distress. Abdominal: Soft.  There is no tenderness. Psychiatric: Patient has a normal mood and affect. behavior is normal. Judgment and thought content normal.   PHQ2/9: Depression screen [facility] 2/9 01/26/2021 10/28/2020 10/19/2020 06/21/2020 03/15/2020  Decreased Interest 0 1 1 1  0  Down, Depressed, Hopeless 0 1 1 1  0  PHQ - 2 Score 0 2 2 2  0  Altered sleeping 0 0 0 0 -  Tired, decreased energy 3  0 0 1 -  Change in appetite 0 0 0 1 -  Feeling bad or failure about yourself  0 0 0 0 -  Trouble concentrating 0 0 0 0 -  Moving slowly or fidgety/restless 0 0 0 0 -  Suicidal thoughts 0 0 0 0 -  PHQ-9 Score 3 2 2 4  -  Difficult doing work/chores - Not difficult at all Not difficult at all - -  Some recent data might be hidden    phq 9 is negative   Fall Risk: Fall Risk  01/26/2021 10/28/2020 10/19/2020 06/21/2020 03/15/2020  Falls in the past year? 0 1 1 1  0  Comment - - - - -  Number falls in past yr: 0 1 1 1  0  Injury with Fall? 0 0 0 0 0  Comment - - - - -  Risk Factor Category  - - - - -  Risk for fall due to : No Fall Risks History of fall(s) History of fall(s) - -  Risk for fall due to: Comment - - - - -  Follow up Falls prevention discussed Falls prevention discussed Falls prevention discussed - -      Functional Status Survey: Is the patient deaf or have difficulty hearing?: No Does the patient have difficulty seeing, even when wearing glasses/contacts?: No Does the patient have difficulty concentrating, remembering, or making decisions?: No Does the patient have difficulty walking or climbing stairs?: No Does the patient have difficulty dressing or bathing?: No Does the patient have difficulty doing errands alone such as visiting a doctor's office or shopping?: No    Assessment & Plan  1. Morbid obesity (HCC)  Discussed with the patient the risk posed by an increased BMI. Discussed importance of portion control, calorie counting and at least 150 minutes of physical activity weekly. Avoid sweet beverages and drink more water. Eat at least 6 servings of fruit and vegetables daily    2. Depression, major, recurrent, mild (HCC)   3. Dysmetabolic syndrome   4. Dyslipidemia   5. Need for immunization against influenza  - Flu Vaccine  QUAD High Dose(Fluad)  6. Vitamin D deficiency   7. Benign essential HTN  - BASIC METABOLIC PANEL WITH GFR - TSH - nadolol  (CORGARD) 40 MG tablet; Take 1 tablet (40 mg total) by mouth daily.  Dispense: 90 tablet; Refill: 1  8. Decreased calculated GFR   9. Perennial allergic rhinitis with seasonal variation  - azelastine (ASTELIN) 0.1 % nasal spray; Place 2 sprays into both nostrils 2 (two) times daily. Use in each nostril as directed  Dispense: 30 mL; Refill: 2

## 2021-01-26 ENCOUNTER — Ambulatory Visit: Payer: Medicare PPO | Admitting: Family Medicine

## 2021-01-26 ENCOUNTER — Encounter: Payer: Self-pay | Admitting: Family Medicine

## 2021-01-26 ENCOUNTER — Other Ambulatory Visit: Payer: Self-pay

## 2021-01-26 DIAGNOSIS — I10 Essential (primary) hypertension: Secondary | ICD-10-CM | POA: Diagnosis not present

## 2021-01-26 DIAGNOSIS — E8881 Metabolic syndrome: Secondary | ICD-10-CM

## 2021-01-26 DIAGNOSIS — E559 Vitamin D deficiency, unspecified: Secondary | ICD-10-CM | POA: Diagnosis not present

## 2021-01-26 DIAGNOSIS — J302 Other seasonal allergic rhinitis: Secondary | ICD-10-CM

## 2021-01-26 DIAGNOSIS — R944 Abnormal results of kidney function studies: Secondary | ICD-10-CM

## 2021-01-26 DIAGNOSIS — Z23 Encounter for immunization: Secondary | ICD-10-CM | POA: Diagnosis not present

## 2021-01-26 DIAGNOSIS — F330 Major depressive disorder, recurrent, mild: Secondary | ICD-10-CM

## 2021-01-26 DIAGNOSIS — J3089 Other allergic rhinitis: Secondary | ICD-10-CM | POA: Diagnosis not present

## 2021-01-26 DIAGNOSIS — E785 Hyperlipidemia, unspecified: Secondary | ICD-10-CM

## 2021-01-26 MED ORDER — AZELASTINE HCL 0.1 % NA SOLN: 2.0000 | Freq: Two times a day (BID) | NASAL | 2 refills | Status: DC

## 2021-01-26 MED ORDER — NADOLOL 40 MG PO TABS: 40.0000 mg | ORAL_TABLET | Freq: Every day | ORAL | 1 refills | Status: DC

## 2021-01-26 MED ORDER — BENZONATATE 100 MG PO CAPS: 100.0000 mg | ORAL_CAPSULE | Freq: Two times a day (BID) | ORAL | 0 refills | Status: DC | PRN

## 2021-01-27 LAB — BASIC METABOLIC PANEL WITH GFR
BUN: 12 mg/dL (ref 7–25)
CO2: 32 mmol/L (ref 20–32)
Calcium: 9.7 mg/dL (ref 8.6–10.4)
Chloride: 101 mmol/L (ref 98–110)
Creat: 0.96 mg/dL (ref 0.50–1.05)
Glucose, Bld: 87 mg/dL (ref 65–99)
Potassium: 3.6 mmol/L (ref 3.5–5.3)
Sodium: 142 mmol/L (ref 135–146)
eGFR: 64 mL/min/{1.73_m2} (ref >=60)

## 2021-01-27 LAB — TSH: TSH: 1.94 mIU/L (ref 0.40–4.50)

## 2021-01-28 ENCOUNTER — Telehealth: Payer: Self-pay

## 2021-01-28 NOTE — Telephone Encounter (Signed)
Copied from CRM [id number]. Topic: General - Other >> Jan 28, 2021 11:42 AM Monique Baker wrote: Reason for CRM: Pt stated she received a voicemail message but it was not clear. Pt requests call back   Spoke with patient and informed her that the reason for our call was to let her know that her recent labs were in normal limits per Dr. Carlynn Purl.  Patient verbalized understanding.

## 2021-02-22 DIAGNOSIS — R059 Cough, unspecified: Secondary | ICD-10-CM | POA: Diagnosis not present

## 2021-02-22 DIAGNOSIS — Z604 Social exclusion and rejection: Secondary | ICD-10-CM | POA: Diagnosis not present

## 2021-02-22 DIAGNOSIS — Z7984 Long term (current) use of oral hypoglycemic drugs: Secondary | ICD-10-CM | POA: Diagnosis not present

## 2021-02-22 DIAGNOSIS — E669 Obesity, unspecified: Secondary | ICD-10-CM | POA: Diagnosis not present

## 2021-02-22 DIAGNOSIS — E119 Type 2 diabetes mellitus without complications: Secondary | ICD-10-CM | POA: Diagnosis not present

## 2021-02-22 DIAGNOSIS — E785 Hyperlipidemia, unspecified: Secondary | ICD-10-CM | POA: Diagnosis not present

## 2021-02-22 DIAGNOSIS — F3341 Major depressive disorder, recurrent, in partial remission: Secondary | ICD-10-CM | POA: Diagnosis not present

## 2021-02-22 DIAGNOSIS — I10 Essential (primary) hypertension: Secondary | ICD-10-CM | POA: Diagnosis not present

## 2021-02-22 DIAGNOSIS — Z833 Family history of diabetes mellitus: Secondary | ICD-10-CM | POA: Diagnosis not present

## 2021-03-05 ENCOUNTER — Other Ambulatory Visit: Payer: Self-pay | Admitting: Family Medicine

## 2021-03-05 DIAGNOSIS — J302 Other seasonal allergic rhinitis: Secondary | ICD-10-CM

## 2021-03-05 DIAGNOSIS — J3089 Other allergic rhinitis: Secondary | ICD-10-CM

## 2021-03-05 NOTE — Telephone Encounter (Signed)
Requested Prescriptions  Pending Prescriptions Disp Refills  . loratadine (CLARITIN) 10 MG tablet [Pharmacy Med Name: LORATADINE 10 MG TABLET] 90 tablet 1    Sig: TAKE 1 TABLET BY MOUTH EVERY DAY     Ear, Nose, and Throat:  Antihistamines Passed - 03/05/2021 11:37 AM      Passed - Valid encounter within last 12 months    Recent Outpatient Visits          1 month ago Morbid obesity [facility])   [facility], MD   4 months ago Depression, major, recurrent, mild [facility])   [facility] [place], Sheliah Mends, PA-C   8 months ago Depression, major, recurrent, mild [facility])   [facility] Alba Cory, MD   11 months ago Morbid obesity [facility])   [facility] Alba Cory, MD   1 year ago Benign essential HTN   [facility] Al[facility] Cory, MD      Future Appointments            In 3 weeks Alba Cory, MD [facility], [facility]   In 3 months Alba Cory, MD [facility], [facility]   In 8 months  [facility]

## 2021-03-17 ENCOUNTER — Ambulatory Visit: Payer: Self-pay

## 2021-03-17 NOTE — Telephone Encounter (Signed)
Appt scheduled with Dr Carlynn Purl for Monday (pt did not want to schedule with anyone else)

## 2021-03-17 NOTE — Telephone Encounter (Signed)
  Pt. Reports she has had sporadic nose bleeds x 2 weeks. Will have to use tissues to stop the bleeding. Seems to happen more in the evening from left nare. BP has been good - last night 134/80. Feels well. Started using a humidifier in her bedroom this week. Has an appointment after Thanksgiving. Asking for advice. Please advise pt.     Answer Assessment - Initial Assessment Questions 1. AMOUNT OF BLEEDING: "How bad is the bleeding?" "How much blood was lost?" "Has the bleeding stopped?"   - MILD: needed a couple tissues   - MODERATE: needed many tissues   - SEVERE: large blood clots, soaked many tissues, lasted more than 30 minutes      Small clots, uses many tissues 2. ONSET: "When did the nosebleed start?"      2 weeks ago 3. FREQUENCY: "How many nosebleeds have you had in the last 24 hours?"      1  4. RECURRENT SYMPTOMS: "Have there been other recent nosebleeds?" If Yes, ask: "How long did it take you to stop the bleeding?" "What worked best?"      No 5. CAUSE: "What do you think caused this nosebleed?"     Unsure 6. LOCAL FACTORS: "Do you have any cold symptoms?", "Have you been rubbing or picking at your nose?"     No 7. SYSTEMIC FACTORS: "Do you have high blood pressure or any bleeding problems?"     HTN 8. BLOOD THINNERS: "Do you take any blood thinners?" (e.g., aspirin, clopidogrel / Plavix, coumadin, heparin). Notes: Other strong blood thinners include: Arixtra (fondaparinux), Eliquis (apixaban), Pradaxa (dabigatran), and Xarelto (rivaroxaban).     ASA 81 mg 9. OTHER SYMPTOMS: "Do you have any other symptoms?" (e.g., lightheadedness)     No 10. PREGNANCY: "Is there any chance you are pregnant?" "When was your last menstrual period?"       No  Protocols used: Nosebleed-A-AH

## 2021-03-18 NOTE — Progress Notes (Signed)
Name: Monique Baker   MRN: [id number]    DOB: 10-15-1950   Date:03/21/2021       Progress Note  Subjective  Chief Complaint  Nose Bleeds  HPI   Nosebleeds were occurring almost every other day or every two days for approx 2.5 weeks. Last nosebleed was Wed 03/16/2021.  Patient reports the nosebleeds were mostly dripping in nature, not copious and last for about 10 minutes each episode. Denies severe retrograde bleeding or difficulty bleeding. She reports her nose has felt very dry during this time as well.   Went to pharmacy and started taking Oxymetozoline nasal spray which appears to have helped per her report. She is using a humidifier at night which also appears to be helping.   Denies recent trauma or illness.   Was using Flonase approx 3 weeks ago.   Patient reports fall yesterday in which she hit her left ocular orbit on a bench. She denies LOC during event. Reports mild headache today. She states she was at church, wearing Ugg boots and the boot got caught in the carpet and she fell forward. No loss of consciousness, she has a bruise on left knee and also under left eye    Patient Active Problem List   Diagnosis Date Noted   Osteoarthritis of hip 02/19/2018   Osteopenia of hip 09/04/2016   History of bunionectomy of left great toe 07/15/2015   H/O hammer toe correction 03/15/2015   Arthritis of lumbar spine 01/19/2015   Benign essential HTN 11/05/2014   Carpal tunnel syndrome 11/05/2014   Osteoarthritis 11/05/2014   Dermatitis, eczematoid 11/05/2014   Depression, major, recurrent, mild ([place]) 11/05/2014   Dyslipidemia 11/05/2014   Gastro-esophageal reflux disease without esophagitis 11/05/2014   Morbid obesity ([place]) 33/54/5625   Dysmetabolic syndrome 63/89/3734   Menopausal and perimenopausal disorder 11/05/2014   Perennial allergic rhinitis with seasonal variation 11/05/2014   Seborrhea capitis 11/05/2014   Acquired trigger finger 11/05/2014   Urinary incontinence  in female 11/05/2014   Vitamin D deficiency 11/05/2014   Blood glucose elevated 08/31/2009    Past Surgical History:  Procedure Laterality Date   ABDOMINAL HYSTERECTOMY     CATARACT EXTRACTION, BILATERAL     CESAREAN SECTION     COLONOSCOPY WITH PROPOFOL N/A 03/15/2016   Procedure: COLONOSCOPY WITH PROPOFOL;  Surgeon: Christene Lye, MD;  Location: [facility];  Service: Endoscopy;  Laterality: N/A;   HALLUX VALGUS LAPIDUS Left 06/16/2015   Procedure: HALLUX VALGUS LAPIDUS;  Surgeon: Samara Deist, DPM;  Location: [place];  Service: Podiatry;  Laterality: Left;  WITH POPLITEAL   HAMMER TOE SURGERY Left 06/16/2015   Procedure: HAMMER TOE CORRECTION SECOND TOE LEFT;  Surgeon: Samara Deist, DPM;  Location: [place];  Service: Podiatry;  Laterality: Left;  prediabetic - on oral meds   hip replacemanet Right 03/15/2018   Jeneen Rinks Bowers-Emergeortho   MYOMECTOMY     OOPHORECTOMY     trigger finger Right 12/15/2014   Vulva cyst  11/2006    Family History  Problem Relation Age of Onset   Dementia Mother    Diabetes Father    Stroke Father    Cancer Daughter        Fibroid Tumors   Diabetes Brother    Breast cancer Neg Hx     Social History   Tobacco Use   Smoking status: Never   Smokeless tobacco: Never   Tobacco comments:    smoking cessation materials not required  Substance Use Topics  Alcohol use: No    Alcohol/week: 0.0 standard drinks     Current Outpatient Medications:    acetaminophen (TYLENOL) 500 MG tablet, Take by mouth. Reported on 11/15/2015, Disp: , Rfl:    aspirin EC 81 MG tablet, Take 81 mg by mouth daily., Disp: , Rfl:    atorvastatin (LIPITOR) 40 MG tablet, Take 1 tablet (40 mg total) by mouth every evening. for cholesterol, Disp: 90 tablet, Rfl: 1   azelastine (ASTELIN) 0.1 % nasal spray, Place 2 sprays into both nostrils 2 (two) times daily. Use in each nostril as directed, Disp: 30 mL, Rfl: 2   buPROPion (WELLBUTRIN XL) 150  MG 24 hr tablet, Take 1 tablet (150 mg total) by mouth daily before breakfast., Disp: 90 tablet, Rfl: 1   Cholecalciferol (VITAMIN D PO), Take 1 capsule by mouth daily. 3000 IU daily, Disp: , Rfl:    citalopram (CELEXA) 20 MG tablet, TAKE 1 TABLET BY MOUTH EVERY DAY, Disp: 90 tablet, Rfl: 1   Cyanocobalamin (B-12) 1000 MCG SUBL, Place 1 each under the tongue 2 (two) times a week. , Disp: , Rfl:    fluticasone (FLONASE) 50 MCG/ACT nasal spray, SPRAY 2 SPRAYS INTO EACH NOSTRIL EVERY DAY, Disp: 48 mL, Rfl: 1   glucose blood (ONETOUCH VERIO) test strip, Use as instructed, Disp: 100 each, Rfl: 12   loratadine (CLARITIN) 10 MG tablet, TAKE 1 TABLET BY MOUTH EVERY DAY, Disp: 90 tablet, Rfl: 1   metFORMIN (GLUCOPHAGE) 500 MG tablet, Take 1 tablet (500 mg total) by mouth 2 (two) times daily., Disp: 180 tablet, Rfl: 1   nadolol (CORGARD) 40 MG tablet, Take 1 tablet (40 mg total) by mouth daily., Disp: 90 tablet, Rfl: 1   Olmesartan-amLODIPine-HCTZ 40-5-12.5 MG TABS, Take 1 tablet by mouth daily., Disp: 90 tablet, Rfl: 1   triamcinolone cream (KENALOG) 0.1 %, Reported on 07/15/2015, Disp: , Rfl:    benzonatate (TESSALON) 100 MG capsule, Take 1 capsule (100 mg total) by mouth 2 (two) times daily as needed for cough. (Patient not taking: Reported on 03/21/2021), Disp: 40 capsule, Rfl: 0  Allergies  Allergen Reactions   Duloxetine     sweat    I personally reviewed active problem list, medication list, allergies, family history, social history, health maintenance with the patient/caregiver today.   Review of Systems  HENT:  Positive for nosebleeds. Negative for congestion and sore throat.     Objective  Vitals:   03/21/21 1124  BP: 132/84  Pulse: 76  Resp: 16  Temp: 98.7 F (37.1 C)  SpO2: 97%  Weight: 203 lb (92.1 kg)  Height: _0  (1.575 m)    Body mass index is 37.13 kg/m.  Physical Exam Constitutional:      Appearance: Normal appearance.  HENT:     Head: Normocephalic.       Nose: Nose normal.     Comments: Erythematous and friable nares. No signs of active bleeding.  Eyes:     General: Lids are normal.     Extraocular Movements: Extraocular movements intact.     Conjunctiva/sclera: Conjunctivae normal.  Neurological:     Mental Status: She is alert.     Recent Results (from the past 2160 hour(s))  BASIC METABOLIC PANEL WITH GFR     Status: None   Collection Time: 01/26/21 11:02 AM  Result Value Ref Range   Glucose, Bld 87 65 - 99 mg/dL    Comment: .  Fasting reference interval .    BUN 12 7 - 25 mg/dL   Creat 0.96 0.50 - 1.05 mg/dL   eGFR 64 > OR = 60 mL/min/1.48m    Comment: The eGFR is based on the CKD-EPI 2021 equation. To calculate  the new eGFR from a previous Creatinine or Cystatin C result, go to https://www.kidney.org/professionals/ kdoqi/gfr%5Fcalculator    BUN/Creatinine Ratio NOT APPLICABLE 6 - 22 (calc)   Sodium 142 135 - 146 mmol/L   Potassium 3.6 3.5 - 5.3 mmol/L   Chloride 101 98 - 110 mmol/L   CO2 32 20 - 32 mmol/L   Calcium 9.7 8.6 - 10.4 mg/dL  TSH     Status: None   Collection Time: 01/26/21 11:02 AM  Result Value Ref Range   TSH 1.94 0.40 - 4.50 mIU/L     PHQ2/9: [facility] 03/21/2021 01/26/2021 10/28/2020 10/19/2020 06/21/2020  Decreased Interest 0 0 _0 Down, Depressed, Hopeless 0 0 _1 PHQ - 2 Score 0 0 _2 Altered sleeping 0 0 0 0 0  Tired, decreased energy 3 3 0 0 1  Change in appetite 0 0 0 0 1  Feeling bad or failure about yourself  0 0 0 0 0  Trouble concentrating 0 0 0 0 0  Moving slowly or fidgety/restless 0 0 0 0 0  Suicidal thoughts 0 0 0 0 0  PHQ-9 Score _3 Difficult doing work/chores - - Not difficult at all Not difficult at all -  Some recent data might be hidden    phq 9 is positive   Fall Risk: Fall Risk  03/21/2021 01/26/2021 10/28/2020 10/19/2020 06/21/2020  Falls in the past year? 1 0 _4 Comment - - - - -  Number falls in past yr: 0 0 _5 Injury with Fall? 1 0 0 0 0  Comment - - - - -  Risk Factor Category  - - - - -  Risk for fall due to : No Fall Risks No Fall Risks History of fall(s) History of fall(s) -  Risk for fall due to: Comment - - - - -  Follow up Falls prevention discussed Falls prevention discussed Falls prevention discussed Falls prevention discussed -      Functional Status Survey: Is the patient deaf or have difficulty hearing?: No Does the patient have difficulty seeing, even when wearing glasses/contacts?: No Does the patient have difficulty concentrating, remembering, or making decisions?: No Does the patient have difficulty walking or climbing stairs?: No Does the patient have difficulty dressing or bathing?: No Does the patient have difficulty doing errands alone such as visiting a doctor's office or shopping?: No    Assessment & Plan  1. Epistaxis  Advised to stop Afrin, to only use for 24  - 48 hours to avoid rebound rhinorrhea. Discussed again how to use flonase to avoid nose bleeds, continue room humidifier and contact me if she develops recurrence of symptoms   2. History of recent fall  Discussed referral to PT but she wants to hold off for now   3. Effusion of left knee  From recent fall, advised ice, elevation and rest   4. Left facial pain  Discussed progression of bruise and to let me know if pain on zigomatic bone does not resolve once swelling is down

## 2021-03-21 ENCOUNTER — Other Ambulatory Visit: Payer: Self-pay

## 2021-03-21 ENCOUNTER — Encounter: Payer: Self-pay | Admitting: Family Medicine

## 2021-03-21 ENCOUNTER — Ambulatory Visit: Payer: Medicare PPO | Admitting: Family Medicine

## 2021-03-21 VITALS — BP 132/84 | HR 76 | Temp 98.7°F | Resp 16 | Ht 62.0 in | Wt 203.0 lb

## 2021-03-21 DIAGNOSIS — Z9181 History of falling: Secondary | ICD-10-CM

## 2021-03-21 DIAGNOSIS — R040 Epistaxis: Secondary | ICD-10-CM

## 2021-03-21 DIAGNOSIS — M25462 Effusion, left knee: Secondary | ICD-10-CM

## 2021-03-21 DIAGNOSIS — R519 Headache, unspecified: Secondary | ICD-10-CM | POA: Diagnosis not present

## 2021-03-23 NOTE — Progress Notes (Signed)
Name: Monique Baker   MRN: [id number]    DOB: [birth year]   Date:03/28/2021       Progress Note  Subjective  Chief Complaint  Follow Up  HPI    HTN: taking bp medication, denies side effects, no chest pain but since this Summer her bp started to go up, she was seen by Delsa Grana and Tribenzor dose was adjusted from 20/5/12.5 mg to 40/5/12.5 and improved for a period of time however but started to trend up again. Last visit we asked her to double the dose of Nadolol 20 mg and get a new rx for 40 mg, she states initially she took two of the Nadolol but when she got a new rx it was again for 20 mg dose and she is not taking 40 mg at this time. BP is back to normal range at home and also here in the office.     Patient Active Problem List   Diagnosis Date Noted   Osteoarthritis of hip 02/19/2018   Osteopenia of hip 09/04/2016   History of bunionectomy of left great toe 07/15/2015   H/O hammer toe correction 03/15/2015   Arthritis of lumbar spine 01/19/2015   Benign essential HTN 11/05/2014   Carpal tunnel syndrome 11/05/2014   Osteoarthritis 11/05/2014   Dermatitis, eczematoid 11/05/2014   Depression, major, recurrent, mild ([place]) 11/05/2014   Dyslipidemia 11/05/2014   Gastro-esophageal reflux disease without esophagitis 11/05/2014   Morbid obesity ([place]) 24/23/5361   Dysmetabolic syndrome 44/31/5400   Menopausal and perimenopausal disorder 11/05/2014   Perennial allergic rhinitis with seasonal variation 11/05/2014   Seborrhea capitis 11/05/2014   Acquired trigger finger 11/05/2014   Urinary incontinence in female 11/05/2014   Vitamin D deficiency 11/05/2014   Blood glucose elevated 08/31/2009    Past Surgical History:  Procedure Laterality Date   ABDOMINAL HYSTERECTOMY     CATARACT EXTRACTION, BILATERAL     CESAREAN SECTION     COLONOSCOPY WITH PROPOFOL N/A 03/15/2016   Procedure: COLONOSCOPY WITH PROPOFOL;  Surgeon: Christene Lye, MD;  Location: [facility];   Service: Endoscopy;  Laterality: N/A;   HALLUX VALGUS LAPIDUS Left 06/16/2015   Procedure: HALLUX VALGUS LAPIDUS;  Surgeon: Samara Deist, DPM;  Location: [place];  Service: Podiatry;  Laterality: Left;  WITH POPLITEAL   HAMMER TOE SURGERY Left 06/16/2015   Procedure: HAMMER TOE CORRECTION SECOND TOE LEFT;  Surgeon: Samara Deist, DPM;  Location: [place];  Service: Podiatry;  Laterality: Left;  prediabetic - on oral meds   hip replacemanet Right 03/15/2018   Jeneen Rinks Bowers-Emergeortho   MYOMECTOMY     OOPHORECTOMY     trigger finger Right 12/15/2014   Vulva cyst  11/2006    Family History  Problem Relation Age of Onset   Dementia Mother    Diabetes Father    Stroke Father    Cancer Daughter        Fibroid Tumors   Diabetes Brother    Breast cancer Neg Hx     Social History   Tobacco Use   Smoking status: Never   Smokeless tobacco: Never   Tobacco comments:    smoking cessation materials not required  Substance Use Topics   Alcohol use: No    Alcohol/week: 0.0 standard drinks     Current Outpatient Medications:    acetaminophen (TYLENOL) 500 MG tablet, Take by mouth. Reported on 11/15/2015, Disp: , Rfl:    aspirin EC 81 MG tablet, Take 81 mg by mouth daily., Disp: ,  Rfl:    atorvastatin (LIPITOR) 40 MG tablet, Take 1 tablet (40 mg total) by mouth every evening. for cholesterol, Disp: 90 tablet, Rfl: 1   azelastine (ASTELIN) 0.1 % nasal spray, Place 2 sprays into both nostrils 2 (two) times daily. Use in each nostril as directed, Disp: 30 mL, Rfl: 2   buPROPion (WELLBUTRIN XL) 150 MG 24 hr tablet, Take 1 tablet (150 mg total) by mouth daily before breakfast., Disp: 90 tablet, Rfl: 1   Cholecalciferol (VITAMIN D PO), Take 1 capsule by mouth daily. 3000 IU daily, Disp: , Rfl:    citalopram (CELEXA) 20 MG tablet, TAKE 1 TABLET BY MOUTH EVERY DAY, Disp: 90 tablet, Rfl: 1   Cyanocobalamin (B-12) 1000 MCG SUBL, Place 1 each under the tongue 2 (two) times a week.  , Disp: , Rfl:    fluticasone (FLONASE) 50 MCG/ACT nasal spray, SPRAY 2 SPRAYS INTO EACH NOSTRIL EVERY DAY, Disp: 48 mL, Rfl: 1   glucose blood (ONETOUCH VERIO) test strip, Use as instructed, Disp: 100 each, Rfl: 12   loratadine (CLARITIN) 10 MG tablet, TAKE 1 TABLET BY MOUTH EVERY DAY, Disp: 90 tablet, Rfl: 1   metFORMIN (GLUCOPHAGE) 500 MG tablet, Take 1 tablet (500 mg total) by mouth 2 (two) times daily., Disp: 180 tablet, Rfl: 1   nadolol (CORGARD) 40 MG tablet, Take 1 tablet (40 mg total) by mouth daily., Disp: 90 tablet, Rfl: 1   Olmesartan-amLODIPine-HCTZ 40-5-12.5 MG TABS, Take 1 tablet by mouth daily., Disp: 90 tablet, Rfl: 1   triamcinolone cream (KENALOG) 0.1 %, Reported on 07/15/2015, Disp: , Rfl:   Allergies  Allergen Reactions   Duloxetine     sweat    I personally reviewed active problem list, medication list, allergies, family history, social history, health maintenance with the patient/caregiver today.   ROS  Constitutional: Negative for fever or weight change.  Respiratory: Negative for cough and shortness of breath.   Cardiovascular: Negative for chest pain or palpitations.  Gastrointestinal: Negative for abdominal pain, no bowel changes.  Musculoskeletal: Negative for gait problem or joint swelling.  Skin: Negative for rash.  Neurological: Negative for dizziness or headache.  No other specific complaints in a complete review of systems (except as listed in HPI above).   Objective  Vitals:   03/28/21 1057  BP: 130/78  Pulse: 79  Resp: 16  Temp: 98.2 F (36.8 C)  SpO2: 99%  Weight: 203 lb (92.1 kg)  Height: '5\' 2"'  (1.575 m)    Body mass index is 37.13 kg/m.  Physical Exam  Constitutional: Patient appears well-developed and well-nourished. Obese  No distress.  HEENT: head atraumatic, normocephalic, pupils equal and reactive to light, neck supple Cardiovascular: Normal rate, regular rhythm and normal heart sounds.  No murmur heard.1 plus BLE  edema. Pulmonary/Chest: Effort normal and breath sounds normal. No respiratory distress. Abdominal: Soft.  There is no tenderness. Muscular skeletal: ecchymosis of both knees and scab resolving on her left knee  Psychiatric: Patient has a normal mood and affect. behavior is normal. Judgment and thought content normal.   Recent Results (from the past 2160 hour(s))  BASIC METABOLIC PANEL WITH GFR     Status: None   Collection Time: 01/26/21 11:02 AM  Result Value Ref Range   Glucose, Bld 87 65 - 99 mg/dL    Comment: .            Fasting reference interval .    BUN 12 7 - 25 mg/dL   Creat 0.96  0.50 - 1.05 mg/dL   eGFR 64 > OR = 60 mL/min/1.28m    Comment: The eGFR is based on the CKD-EPI 2021 equation. To calculate  the new eGFR from a previous Creatinine or Cystatin C result, go to https://www.kidney.org/professionals/ kdoqi/gfr%5Fcalculator    BUN/Creatinine Ratio NOT APPLICABLE 6 - 22 (calc)   Sodium 142 135 - 146 mmol/L   Potassium 3.6 3.5 - 5.3 mmol/L   Chloride 101 98 - 110 mmol/L   CO2 32 20 - 32 mmol/L   Calcium 9.7 8.6 - 10.4 mg/dL  TSH     Status: None   Collection Time: 01/26/21 11:02 AM  Result Value Ref Range   TSH 1.94 0.40 - 4.50 mIU/L     PHQ2/9: [facility] 03/28/2021 03/21/2021 01/26/2021 10/28/2020 10/19/2020  Decreased Interest 0 0 0 1 1  Down, Depressed, Hopeless 0 0 0 1 1  PHQ - 2 Score 0 0 0 2 2  Altered sleeping 0 0 0 0 0  Tired, decreased energy '3 3 3 ' 0 0  Change in appetite 0 0 0 0 0  Feeling bad or failure about yourself  0 0 0 0 0  Trouble concentrating 0 0 0 0 0  Moving slowly or fidgety/restless 0 0 0 0 0  Suicidal thoughts 0 0 0 0 0  PHQ-9 Score '3 3 3 2 2  ' Difficult doing work/chores - - - Not difficult at all Not difficult at all  Some recent data might be hidden    phq 9 is negative   Fall Risk: Fall Risk  03/28/2021 03/21/2021 01/26/2021 10/28/2020 10/19/2020  Falls in the past year? 1 1 0 1 1  Comment - - - - -   Number falls in past yr: 0 0 0 1 1  Injury with Fall? 0 1 0 0 0  Comment - - - - -  Risk Factor Category  - - - - -  Risk for fall due to : No Fall Risks No Fall Risks No Fall Risks History of fall(s) History of fall(s)  Risk for fall due to: Comment - - - - -  Follow up Falls prevention discussed Falls prevention discussed Falls prevention discussed Falls prevention discussed Falls prevention discussed      Functional Status Survey: Is the patient deaf or have difficulty hearing?: No Does the patient have difficulty seeing, even when wearing glasses/contacts?: No Does the patient have difficulty concentrating, remembering, or making decisions?: No Does the patient have difficulty walking or climbing stairs?: No Does the patient have difficulty dressing or bathing?: No Does the patient have difficulty doing errands alone such as visiting a doctor's office or shopping?: No    Assessment & Plan  1. Benign essential HTN  Stay on Nadolol 20 mg dose since bp is back to normal range  - Olmesartan-amLODIPine-HCTZ 40-5-12.5 MG TABS; Take 1 tablet by mouth daily.  Dispense: 90 tablet; Refill: 1  2. Dyslipidemia  - atorvastatin (LIPITOR) 40 MG tablet; Take 1 tablet (40 mg total) by mouth every evening. for cholesterol  Dispense: 90 tablet; Refill: 1  3. Depression, major, recurrent, mild (HCC)  - buPROPion (WELLBUTRIN XL) 150 MG 24 hr tablet; Take 1 tablet (150 mg total) by mouth daily before breakfast.  Dispense: 90 tablet; Refill: 1  4. GAD (generalized anxiety disorder)  - buPROPion (WELLBUTRIN XL) 150 MG 24 hr tablet; Take 1 tablet (150 mg total) by mouth daily before breakfast.  Dispense: 90 tablet; Refill: 1  5. Dysmetabolic syndrome  - metFORMIN (GLUCOPHAGE) 500 MG tablet; Take 1 tablet (500 mg total) by mouth 2 (two) times daily.  Dispense: 180 tablet; Refill: 1  6. Hyperglycemia  - metFORMIN (GLUCOPHAGE) 500 MG tablet; Take 1 tablet (500 mg total) by mouth 2 (two) times  daily.  Dispense: 180 tablet; Refill: 1

## 2021-03-28 ENCOUNTER — Ambulatory Visit: Payer: Medicare PPO | Admitting: Family Medicine

## 2021-03-28 ENCOUNTER — Other Ambulatory Visit: Payer: Self-pay

## 2021-03-28 ENCOUNTER — Encounter: Payer: Self-pay | Admitting: Family Medicine

## 2021-03-28 VITALS — BP 130/78 | HR 79 | Temp 98.2°F | Resp 16 | Ht 62.0 in | Wt 203.0 lb

## 2021-03-28 DIAGNOSIS — E8881 Metabolic syndrome: Secondary | ICD-10-CM

## 2021-03-28 DIAGNOSIS — F411 Generalized anxiety disorder: Secondary | ICD-10-CM | POA: Diagnosis not present

## 2021-03-28 DIAGNOSIS — I10 Essential (primary) hypertension: Secondary | ICD-10-CM | POA: Diagnosis not present

## 2021-03-28 DIAGNOSIS — E785 Hyperlipidemia, unspecified: Secondary | ICD-10-CM | POA: Diagnosis not present

## 2021-03-28 DIAGNOSIS — F330 Major depressive disorder, recurrent, mild: Secondary | ICD-10-CM

## 2021-03-28 DIAGNOSIS — R739 Hyperglycemia, unspecified: Secondary | ICD-10-CM

## 2021-03-28 MED ORDER — ATORVASTATIN CALCIUM 40 MG PO TABS: 40.0000 mg | ORAL_TABLET | Freq: Every evening | ORAL | 1 refills | Status: DC

## 2021-03-28 MED ORDER — BUPROPION HCL ER (XL) 150 MG PO TB24: 150.0000 mg | ORAL_TABLET | Freq: Every day | ORAL | 1 refills | Status: DC

## 2021-03-28 MED ORDER — METFORMIN HCL 500 MG PO TABS: 500.0000 mg | ORAL_TABLET | Freq: Two times a day (BID) | ORAL | 1 refills | Status: DC

## 2021-03-28 MED ORDER — NADOLOL 20 MG PO TABS: 20.0000 mg | ORAL_TABLET | Freq: Every day | ORAL | 0 refills | Status: DC

## 2021-03-28 MED ORDER — OLMESARTAN-AMLODIPINE-HCTZ 40-5-12.5 MG PO TABS: 1.0000 | ORAL_TABLET | Freq: Every day | ORAL | 1 refills | Status: DC

## 2021-04-18 ENCOUNTER — Ambulatory Visit: Payer: Medicare PPO | Admitting: Family Medicine

## 2021-04-22 ENCOUNTER — Other Ambulatory Visit: Payer: Self-pay | Admitting: Family Medicine

## 2021-04-22 DIAGNOSIS — J302 Other seasonal allergic rhinitis: Secondary | ICD-10-CM

## 2021-04-22 NOTE — Telephone Encounter (Signed)
Requested Prescriptions  Pending Prescriptions Disp Refills   Azelastine HCl 137 MCG/SPRAY SOLN [Pharmacy Med Name: AZELASTINE 0.1% (137 MCG) SPRY] 30 mL 2    Sig: PLACE 2 SPRAYS INTO BOTH NOSTRILS 2 (TWO) TIMES DAILY. USE IN EACH NOSTRIL AS DIRECTED     Ear, Nose, and Throat: Nasal Preparations - Antiallergy Passed - 04/22/2021  1:41 AM      Passed - Valid encounter within last 12 months    Recent Outpatient Visits          3 weeks ago Benign essential HTN   [facility] Cory, MD   1 month ago Epistaxis   [facility] Alba Cory, MD   2 months ago Morbid obesity [facility])   [facility] Alba Cory, MD   6 months ago Depression, major, recurrent, mild [facility])   [facility] Danelle Berry, PA-C   10 months ago Depression, major, recurrent, mild [facility])   [facility] Alba Cory, MD      Future Appointments            In 2 months Carlynn Purl, Danna Hefty, MD [facility], [facility]   In 3 months Alba Cory, MD [facility], [facility]   In 6 months  [facility], [facility]

## 2021-06-22 NOTE — Patient Instructions (Signed)
Preventive Care 65 Years and Older, Female °Preventive care refers to lifestyle choices and visits with your health care provider that can promote health and wellness. Preventive care visits are also called wellness exams. °What can I expect for my preventive care visit? °Counseling °Your health care provider may ask you questions about your: °Medical history, including: °Past medical problems. °Family medical history. °Pregnancy and menstrual history. °History of falls. °Current health, including: °Memory and ability to understand (cognition). °Emotional well-being. °Home life and relationship well-being. °Sexual activity and sexual health. °Lifestyle, including: °Alcohol, nicotine or tobacco, and drug use. °Access to firearms. °Diet, exercise, and sleep habits. °Work and work environment. °Sunscreen use. °Safety issues such as seatbelt and bike helmet use. °Physical exam °Your health care provider will check your: °Height and weight. These may be used to calculate your BMI (body mass index). BMI is a measurement that tells if you are at a healthy weight. °Waist circumference. This measures the distance around your waistline. This measurement also tells if you are at a healthy weight and may help predict your risk of certain diseases, such as type 2 diabetes and high blood pressure. °Heart rate and blood pressure. °Body temperature. °Skin for abnormal spots. °What immunizations do I need? °Vaccines are usually given at various ages, according to a schedule. Your health care provider will recommend vaccines for you based on your age, medical history, and lifestyle or other factors, such as travel or where you work. °What tests do I need? °Screening °Your health care provider may recommend screening tests for certain conditions. This may include: °Lipid and cholesterol levels. °Hepatitis C test. °Hepatitis B test. °HIV (human immunodeficiency virus) test. °STI (sexually transmitted infection) testing, if you are at  risk. °Lung cancer screening. °Colorectal cancer screening. °Diabetes screening. This is done by checking your blood sugar (glucose) after you have not eaten for a while (fasting). °Mammogram. Talk with your health care provider about how often you should have regular mammograms. °BRCA-related cancer screening. This may be done if you have a family history of breast, ovarian, tubal, or peritoneal cancers. °Bone density scan. This is done to screen for osteoporosis. °Talk with your health care provider about your test results, treatment options, and if necessary, the need for more tests. °Follow these instructions at home: °Eating and drinking ° °Eat a diet that includes fresh fruits and vegetables, whole grains, lean protein, and low-fat dairy products. Limit your intake of foods with high amounts of sugar, saturated fats, and salt. °Take vitamin and mineral supplements as recommended by your health care provider. °Do not drink alcohol if your health care provider tells you not to drink. °If you drink alcohol: °Limit how much you have to 0-1 drink a day. °Know how much alcohol is in your drink. In the [place] one drink equals one 12 oz bottle of beer (355 mL), one 5 oz glass of wine (148 mL), or one 1½ oz glass of hard liquor (44 mL). °Lifestyle °Brush your teeth every morning and night with fluoride toothpaste. Floss one time each day. °Exercise for at least 30 minutes 5 or more days each week. °Do not use any products that contain nicotine or tobacco. These products include cigarettes, chewing tobacco, and vaping devices, such as e-cigarettes. If you need help quitting, ask your health care provider. °Do not use drugs. °If you are sexually active, practice safe sex. Use a condom or other form of protection in order to prevent STIs. °Take aspirin only as told by your   health care provider. Make sure that you understand how much to take and what form to take. Work with your health care provider to find out whether it  is safe and beneficial for you to take aspirin daily. Ask your health care provider if you need to take a cholesterol-lowering medicine (statin). Find healthy ways to manage stress, such as: Meditation, yoga, or listening to music. Journaling. Talking to a trusted person. Spending time with friends and family. Minimize exposure to UV radiation to reduce your risk of skin cancer. Safety Always wear your seat belt while driving or riding in a vehicle. Do not drive: If you have been drinking alcohol. Do not ride with someone who has been drinking. When you are tired or distracted. While texting. If you have been using any mind-altering substances or drugs. Wear a helmet and other protective equipment during sports activities. If you have firearms in your house, make sure you follow all gun safety procedures. What's next? Visit your health care provider once a year for an annual wellness visit. Ask your health care provider how often you should have your eyes and teeth checked. Stay up to date on all vaccines. This information is not intended to replace advice given to you by your health care provider. Make sure you discuss any questions you have with your health care provider. Document Revised: 10/13/2020 Document Reviewed: 10/13/2020 Elsevier Patient Education  [place].

## 2021-06-22 NOTE — Progress Notes (Signed)
Name: Monique Baker   MRN: [id number]    DOB: Dec 30, 1950   Date:06/23/2021       Progress Note  Subjective  Chief Complaint  Annual Exam  HPI  Patient presents for annual CPE.  Diet: cutting down on eating out, still on weight watchers, avoiding sweet tea  Exercise: continue regular physical activity    Flowsheet Row Clinical Support from 10/28/2020 in [facility]  AUDIT-C Score 0      Depression: Phq 9 is  positive Depression screen [facility] 2/9 06/23/2021 03/28/2021 03/21/2021 01/26/2021 10/28/2020  Decreased Interest 0 0 0 0 1  Down, Depressed, Hopeless 2 0 0 0 1  PHQ - 2 Score 2 0 0 0 2  Altered sleeping 0 0 0 0 0  Tired, decreased energy '3 3 3 3 ' 0  Change in appetite 0 0 0 0 0  Feeling bad or failure about yourself  0 0 0 0 0  Trouble concentrating 0 0 0 0 0  Moving slowly or fidgety/restless 0 0 0 0 0  Suicidal thoughts 0 0 0 0 0  PHQ-9 Score '5 3 3 3 2  ' Difficult doing work/chores - - - - Not difficult at all  Some recent data might be hidden   Hypertension: BP Readings from Last 3 Encounters:  06/23/21 132/84  03/28/21 130/78  03/21/21 132/84   Obesity: Wt Readings from Last 3 Encounters:  06/23/21 200 lb (90.7 kg)  03/28/21 203 lb (92.1 kg)  03/21/21 203 lb (92.1 kg)   BMI Readings from Last 3 Encounters:  06/23/21 36.58 kg/m  03/28/21 37.13 kg/m  03/21/21 37.13 kg/m     Vaccines:   Tdap: up to date Shingrix: she had first shot yesterday at the local pharmacy  Pneumonia: next visit  Flu: up to date COVID-46: up to date    Hep C Screening: 12/28/11 STD testing and prevention (HIV/chl/gon/syphilis): N/A Intimate partner violence: negative Sexual History : not sexually active  Menstrual History/LMP/Abnormal Bleeding: post menopausal  Discussed importance of follow up if any post-menopausal bleeding: yes Incontinence Symptoms: No.  Breast cancer:  - Last Mammogram: 07/26/20 - BRCA gene screening: N/A  Osteoporosis Prevention :  Discussed high calcium and vitamin D supplementation, weight bearing exercises Bone density : we will repeat it this year, last one was 5 years ago   Cervical cancer screening: N/A  Skin cancer: Discussed monitoring for atypical lesions  Colorectal cancer: 03/15/16   Lung cancer:  Low Dose CT Chest recommended if Age 66-80 years, 20 pack-year currently smoking OR have quit w/in 15years. Patient does not qualify.   ECG: 01/23/18  Advanced Care Planning: A voluntary discussion about advance care planning including the explanation and discussion of advance directives.  Discussed health care proxy and Living will, and the patient was able to identify a health care proxy as husband .  Patient does not have power of attorney of health care   Lipids: Lab Results  Component Value Date   CHOL 126 10/19/2020   CHOL 132 11/04/2019   CHOL 117 11/14/2018   Lab Results  Component Value Date   HDL 58 10/19/2020   HDL 60 11/04/2019   HDL 55 11/14/2018   Lab Results  Component Value Date   LDLCALC 52 10/19/2020   LDLCALC 56 11/04/2019   LDLCALC 46 11/14/2018   Lab Results  Component Value Date   TRIG 79 10/19/2020   TRIG 80 11/04/2019   TRIG 75 11/14/2018   Lab Results  Component Value Date   CHOLHDL 2.2 10/19/2020   CHOLHDL 2.2 11/04/2019   CHOLHDL 2.1 11/14/2018   No results found for: LDLDIRECT  Glucose: Glucose, Bld  Date Value Ref Range Status  01/26/2021 87 65 - 99 mg/dL Final    Comment:    .            Fasting reference interval .   10/19/2020 87 65 - 99 mg/dL Final    Comment:    .            Fasting reference interval .   11/04/2019 84 65 - 99 mg/dL Final    Comment:    .            Fasting reference interval .    Glucose-Capillary  Date Value Ref Range Status  03/15/2016 110 (H) 65 - 99 mg/dL Final  06/16/2015 80 65 - 99 mg/dL Final  06/16/2015 93 65 - 99 mg/dL Final    Patient Active Problem List   Diagnosis Date Noted   Osteoarthritis of hip  02/19/2018   Osteopenia of hip 09/04/2016   History of bunionectomy of left great toe 07/15/2015   H/O hammer toe correction 03/15/2015   Arthritis of lumbar spine 01/19/2015   Benign essential HTN 11/05/2014   Carpal tunnel syndrome 11/05/2014   Osteoarthritis 11/05/2014   Dermatitis, eczematoid 11/05/2014   Depression, major, recurrent, mild ([place]) 11/05/2014   Dyslipidemia 11/05/2014   Gastro-esophageal reflux disease without esophagitis 11/05/2014   Morbid obesity ([place]) 93/26/7124   Dysmetabolic syndrome 58/12/9831   Menopausal and perimenopausal disorder 11/05/2014   Perennial allergic rhinitis with seasonal variation 11/05/2014   Seborrhea capitis 11/05/2014   Acquired trigger finger 11/05/2014   Urinary incontinence in female 11/05/2014   Vitamin D deficiency 11/05/2014   Blood glucose elevated 08/31/2009    Past Surgical History:  Procedure Laterality Date   ABDOMINAL HYSTERECTOMY     CATARACT EXTRACTION, BILATERAL     CESAREAN SECTION     COLONOSCOPY WITH PROPOFOL N/A 03/15/2016   Procedure: COLONOSCOPY WITH PROPOFOL;  Surgeon: Christene Lye, MD;  Location: [facility] END[facility]COPY;  Service: Endoscopy;  Laterality: N/A;   HALLUX VALGUS LAPIDUS Left 06/16/2015   Procedure: HALLUX VALGUS LAPIDUS;  Surgeon: Samara Deist, DPM;  Location: [place];  Service: Podiatry;  Laterality: Left;  WITH POPLITEAL   HAMMER TOE SURGERY Left 06/16/2015   Procedure: HAMMER TOE CORRECTION SECOND TOE LEFT;  Surgeon: Samara Deist, DPM;  Location: [place];  Service: Podiatry;  Laterality: Left;  prediabetic - on oral meds   hip replacemanet Right 03/15/2018   Jeneen Rinks [facility]   MYOMECTOMY     OOPHORECTOMY     trigger finger Right 12/15/2014   Vulva cyst  11/2006    Family History  Problem Relation Age of Onset   Dementia Mother    Diabetes Father    Stroke Father    Cancer Daughter        Fibroid Tumors   Diabetes Brother    Breast cancer Neg Hx      Social History   Socioeconomic History   Marital status: Married    Spouse name: Ilona Sorrel   Number of children: 1   Years of education: Not on file   Highest education level: Bachelor's degree (e.g., BA, AB, BS)  Occupational History   Occupation: retired Pharmacist, hospital    Comment: [place]  Tobacco Use   Smoking status: Never   Smokeless tobacco: Never   Tobacco comments:  smoking cessation materials not required  Vaping Use   Vaping Use: Never used  Substance and Sexual Activity   Alcohol use: No    Alcohol/week: 0.0 standard drinks   Drug use: No   Sexual activity: Not Currently    Partners: Male  Other Topics Concern   Not on file  Social History Narrative   Married to a Theme park manager and 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, clinical (histocompatibility and immunogenetics) )    Social Determinants of Radio broadcast assistant Strain: Low Risk    Difficulty of Paying Living Expenses: Not hard at all  Food Insecurity: No Food Insecurity   Worried About Charity fundraiser in the Last Year: Never true   Arboriculturist in the Last Year: Never true  Transportation Needs: No Transportation Needs   Lack of Transportation (Medical): No   Lack of Transportation (Non-Medical): No  Physical Activity: Sufficiently Active   Days of Exercise per Week: 4 days   Minutes of Exercise per Session: 80 min  Stress: No Stress Concern Present   Feeling of Stress : Only a little  Social Connections: Engineer, building services of Communication with Friends and Family: More than three times a week   Frequency of Social Gatherings with Friends and Family: Twice a week   Attends Religious Services: More than 4 times per year   Active Member of [employer] or Organizations: Yes   Attends Music therapist: More than 4 times per year   Marital Status: Married  Human resources officer Violence: Not At Risk   Fear of Current or Ex-Partner: No   Emotionally Abused: No   Physically Abused: No   Sexually Abused: No     Current  Outpatient Medications:    acetaminophen (TYLENOL) 500 MG tablet, Take by mouth. Reported on 11/15/2015, Disp: , Rfl:    aspirin EC 81 MG tablet, Take 81 mg by mouth daily., Disp: , Rfl:    atorvastatin (LIPITOR) 40 MG tablet, Take 1 tablet (40 mg total) by mouth every evening. for cholesterol, Disp: 90 tablet, Rfl: 1   Azelastine HCl 137 MCG/SPRAY SOLN, PLACE 2 SPRAYS INTO BOTH NOSTRILS 2 (TWO) TIMES DAILY. USE IN EACH NOSTRIL AS DIRECTED, Disp: 30 mL, Rfl: 2   buPROPion (WELLBUTRIN XL) 150 MG 24 hr tablet, Take 1 tablet (150 mg total) by mouth daily before breakfast., Disp: 90 tablet, Rfl: 1   Cholecalciferol (VITAMIN D PO), Take 1 capsule by mouth daily. 3000 IU daily, Disp: , Rfl:    citalopram (CELEXA) 20 MG tablet, TAKE 1 TABLET BY MOUTH EVERY DAY, Disp: 90 tablet, Rfl: 1   Cyanocobalamin (B-12) 1000 MCG SUBL, Place 1 each under the tongue 2 (two) times a week. , Disp: , Rfl:    fluticasone (FLONASE) 50 MCG/ACT nasal spray, SPRAY 2 SPRAYS INTO EACH NOSTRIL EVERY DAY, Disp: 48 mL, Rfl: 1   glucose blood (ONETOUCH VERIO) test strip, Use as instructed, Disp: 100 each, Rfl: 12   loratadine (CLARITIN) 10 MG tablet, TAKE 1 TABLET BY MOUTH EVERY DAY, Disp: 90 tablet, Rfl: 1   metFORMIN (GLUCOPHAGE) 500 MG tablet, Take 1 tablet (500 mg total) by mouth 2 (two) times daily., Disp: 180 tablet, Rfl: 1   nadolol (CORGARD) 20 MG tablet, Take 1 tablet (20 mg total) by mouth daily., Disp: 90 tablet, Rfl: 0   Olmesartan-amLODIPine-HCTZ 40-5-12.5 MG TABS, Take 1 tablet by mouth daily., Disp: 90 tablet, Rfl: 1   triamcinolone cream (KENALOG) 0.1 %,  Reported on 07/15/2015, Disp: , Rfl:   Allergies  Allergen Reactions   Duloxetine     sweat     ROS  Constitutional: Negative for fever or weight change.  Respiratory: Negative for cough and shortness of breath.   Cardiovascular: Negative for chest pain or palpitations.  Gastrointestinal: Negative for abdominal pain, no bowel changes.  Musculoskeletal:  Negative for gait problem or joint swelling.  Skin: Negative for rash.  Neurological: Negative for dizziness or headache.  No other specific complaints in a complete review of systems (except as listed in HPI above).   Objective  Vitals:   06/23/21 0937  BP: 132/84  Pulse: 77  Resp: 16  SpO2: 98%  Weight: 200 lb (90.7 kg)  Height: '5\' 2"'  (1.575 m)    Body mass index is 36.58 kg/m.  Physical Exam  Constitutional: Patient appears well-developed and well-nourished. No distress.  HENT: Head: Normocephalic and atraumatic. Ears: B TMs ok, no erythema or effusion; Nose: Not done  Mouth/Throat: not done  Eyes: Conjunctivae and EOM are normal. Pupils are equal, round, and reactive to light. No scleral icterus.  Neck: Normal range of motion. Neck supple. No JVD present. No thyromegaly present.  Cardiovascular: Normal rate, regular rhythm and normal heart sounds.  No murmur heard. No BLE edema. Pulmonary/Chest: Effort normal and breath sounds normal. No respiratory distress. Abdominal: Soft. Bowel sounds are normal, no distension. There is no tenderness. no masses Breast: no lumps or masses, no nipple discharge or rashes FEMALE GENITALIA:  Not done  RECTAL: not done  Musculoskeletal: Normal range of motion, no joint effusions. No gross deformities Neurological: he is alert and oriented to person, place, and time. No cranial nerve deficit. Coordination, balance, strength, speech and gait are normal.  Skin: Skin is warm and dry. No rash noted. No erythema.  Psychiatric: Patient has a normal mood and affect. behavior is normal. Judgment and thought content normal.   Fall Risk: Fall Risk  06/23/2021 03/28/2021 03/21/2021 01/26/2021 10/28/2020  Falls in the past year? '1 1 1 ' 0 1  Comment - - - - -  Number falls in past yr: 1 0 0 0 1  Injury with Fall? 1 0 1 0 0  Comment - - - - -  Risk Factor Category  - - - - -  Risk for fall due to : No Fall Risks No Fall Risks No Fall Risks No Fall Risks  History of fall(s)  Risk for fall due to: Comment - - - - -  Follow up Falls prevention discussed Falls prevention discussed Falls prevention discussed Falls prevention discussed Falls prevention discussed     Functional Status Survey: Is the patient deaf or have difficulty hearing?: No Does the patient have difficulty seeing, even when wearing glasses/contacts?: No Does the patient have difficulty concentrating, remembering, or making decisions?: No Does the patient have difficulty walking or climbing stairs?: No Does the patient have difficulty dressing or bathing?: No Does the patient have difficulty doing errands alone such as visiting a doctor's office or shopping?: No   Assessment & Plan  1. Well adult exam   2. Need for pneumococcal vaccine  Next visit   3. Encounter for screening mammogram for breast cancer  - MM 3D SCREEN BREAST BILATERAL; Future  4. Osteopenia after menopause  - DG Bone Density; Future   -USPSTF grade A and B recommendations reviewed with patient; age-appropriate recommendations, preventive care, screening tests, etc discussed and encouraged; healthy living encouraged; see AVS for  patient education given to patient -Discussed importance of 150 minutes of physical activity weekly, eat two servings of fish weekly, eat one serving of tree nuts ( cashews, pistachios, pecans, almonds.Marland Kitchen) every other day, eat 6 servings of fruit/vegetables daily and drink plenty of water and avoid sweet beverages.   -Reviewed Health Maintenance: Yes.

## 2021-06-23 ENCOUNTER — Encounter: Payer: Self-pay | Admitting: Family Medicine

## 2021-06-23 ENCOUNTER — Ambulatory Visit (INDEPENDENT_AMBULATORY_CARE_PROVIDER_SITE_OTHER): Payer: Medicare PPO | Admitting: Family Medicine

## 2021-06-23 VITALS — BP 132/84 | HR 77 | Resp 16 | Ht 62.0 in | Wt 200.0 lb

## 2021-06-23 DIAGNOSIS — Z0000 Encounter for general adult medical examination without abnormal findings: Secondary | ICD-10-CM

## 2021-06-23 DIAGNOSIS — Z1231 Encounter for screening mammogram for malignant neoplasm of breast: Secondary | ICD-10-CM | POA: Diagnosis not present

## 2021-06-23 DIAGNOSIS — M8580 Other specified disorders of bone density and structure, unspecified site: Secondary | ICD-10-CM

## 2021-06-23 DIAGNOSIS — Z23 Encounter for immunization: Secondary | ICD-10-CM | POA: Diagnosis not present

## 2021-06-23 DIAGNOSIS — Z780 Asymptomatic menopausal state: Secondary | ICD-10-CM | POA: Diagnosis not present

## 2021-06-27 ENCOUNTER — Other Ambulatory Visit: Payer: Self-pay | Admitting: Family Medicine

## 2021-06-27 DIAGNOSIS — I10 Essential (primary) hypertension: Secondary | ICD-10-CM

## 2021-06-27 DIAGNOSIS — J302 Other seasonal allergic rhinitis: Secondary | ICD-10-CM

## 2021-06-27 DIAGNOSIS — J3089 Other allergic rhinitis: Secondary | ICD-10-CM

## 2021-07-25 NOTE — Progress Notes (Signed)
Name: Monique Baker   MRN: [id number]    DOB: [birth year]   Date:07/26/2021 ? ?     Progress Note ? ?Subjective ? ?Chief Complaint ? ?Follow Up ? ?HPI ? ?HTN: taking bp medication, denies side effects, no chest pain but since this Summer her bp started to go up, she was seen by Delsa Grana and Tribenzor dose was adjusted from 20/5/12.5 mg to 40/5/12.5 and improved for a period of time however but started to trend up again. Last visit we asked her to double the dose of Nadolol 20 mg and get a new rx for 40 mg, she states initially she took two of the Nadolol but when she got a new rx it was again for 20 mg and bp was back to normal range. She states bp usually elevated when she first gets up 150's/80's, she is willing to go up on dose of Nadolol at night from 20 mg to 40 mg . She is under the care of Dr. Clayborn Bigness and advised her to discuss it with him on her upcoming visit this Summer  ? ?Depression Major: she is married to a Theme park manager, he also works as a Network engineer at [employer] We started her on Cymbalta 10/2015 was doing better but caused hair loss, currently on Citalopram and Wellbutrin X but she states she is feeling well and would like to come off Wellbutrin at this time.She has been going to the gym and is really helping her mood, initially she had a personal trainer but currently she is able to workout on her own ?  ?Pre diabetes/metabolic syndrome: she is taking Metformin, denies side effects, hgbA1C has been stable at 5.6 %  she denies  polyphagia, polydipsia or polyuria. She stopped Weight Watchers but is back again, she states not very consistent with her diet but has been going to the gym ore often  ?  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 Harlow Mares She is now pain free, no longer using a cane , she missed last visit.  ?  ?Morbid obesity:   she joined weight watchers 05/2016 at a weight of 224 lbs  Weight has been stable for the  past couple of years.  She is back on  Weight Watchers since end of last year, today weight is 198 lbs. She is also going to the gym.  ?  ?AR: she is doing well on medication, needs refill of Loratadine  ?  ?  ?Patient Active Problem List  ? Diagnosis Date Noted  ? Osteoarthritis of hip 02/19/2018  ? Osteopenia of hip 09/04/2016  ? History of bunionectomy of left great toe 07/15/2015  ? H/O hammer toe correction 03/15/2015  ? 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 Benign essential HTN 11/05/2014  ? Carpal tunnel syndrome 11/05/2014  ? Osteoarthritis 11/05/2014  ? Dermatitis, eczematoid 11/05/2014  ? Depression, major, recurrent, mild ([place]) 11/05/2014  ? Dyslipidemia 11/05/2014  ? Gastro-esophageal reflux disease without esophagitis 11/05/2014  ? Morbid obesity ([place]) 11/05/2014  ? Dysmetabolic syndrome [id number]  ? Menopausal and perimenopausal disorder 11/05/2014  ? Perennial allergic rhinitis with seasonal variation 11/05/2014  ? Seborrhea capitis 11/05/2014  ? Acquired trigger finger 11/05/2014  ? Urinary incontinence in female 11/05/2014  ? Vitamin D deficiency 11/05/2014  ? Blood glucose elevated 08/31/2009  ? ? ?Past Surgical History:  ?Procedure Laterality Date  ? ABDOMINAL HYSTERECTOMY    ? CATARACT EXTRACTION, BILATERAL    ? CESAREAN SECTION    ?  COLONOSCOPY WITH PROPOFOL N/A 03/15/2016  ? Procedure: COLONOSCOPY WITH PROPOFOL;  Surgeon: Christene Lye, MD;  Location: [facility];  Service: Endoscopy;  Laterality: N/A;  ? HALLUX VALGUS LAPIDUS Left 06/16/2015  ? Procedure: HALLUX VALGUS LAPIDUS;  Surgeon: Samara Deist, DPM;  Location: [place];  Service: Podiatry;  Laterality: Left;  WITH POPLITEAL  ? HAMMER TOE SURGERY Left 06/16/2015  ? Procedure: HAMMER TOE CORRECTION SECOND TOE LEFT;  Surgeon: Samara Deist, DPM;  Location: [place];  Service: Podiatry;  Laterality: Left;  prediabetic - on oral meds  ? hip replacemanet Right 03/15/2018  ? Jeneen Rinks Bowers-Emergeortho  ? MYOMECTOMY    ? OOPHORECTOMY    ? trigger  finger Right 12/15/2014  ? Vulva cyst  11/2006  ? ? ?Family History  ?Problem Relation Age of Onset  ? Dementia Mother   ? Diabetes Father   ? Stroke Father   ? Cancer Daughter   ?     Fibroid Tumors  ? Diabetes Brother   ? Breast cancer Neg Hx   ? ? ?Social History  ? ?Tobacco Use  ? Smoking status: Never  ? Smokeless tobacco: Never  ? Tobacco comments:  ?  smoking cessation materials not required  ?Substance Use Topics  ? Alcohol use: No  ?  Alcohol/week: 0.0 standard drinks  ? ? ? ?Current Outpatient Medications:  ?  acetaminophen (TYLENOL) 500 MG tablet, Take by mouth. Reported on 11/15/2015, Disp: , Rfl:  ?  aspirin EC 81 MG tablet, Take 81 mg by mouth daily., Disp: , Rfl:  ?  atorvastatin (LIPITOR) 40 MG tablet, Take 1 tablet (40 mg total) by mouth every evening. for cholesterol, Disp: 90 tablet, Rfl: 1 ?  Azelastine HCl 137 MCG/SPRAY SOLN, PLACE 2 SPRAYS INTO BOTH NOSTRILS 2 (TWO) TIMES DAILY. USE IN EACH NOSTRIL AS DIRECTED, Disp: 30 mL, Rfl: 1 ?  buPROPion (WELLBUTRIN XL) 150 MG 24 hr tablet, Take 1 tablet (150 mg total) by mouth daily before breakfast., Disp: 90 tablet, Rfl: 1 ?  Cholecalciferol (VITAMIN D PO), Take 1 capsule by mouth daily. 3000 IU daily, Disp: , Rfl:  ?  citalopram (CELEXA) 20 MG tablet, TAKE 1 TABLET BY MOUTH EVERY DAY, Disp: 90 tablet, Rfl: 1 ?  Cyanocobalamin (B-12) 1000 MCG SUBL, Place 1 each under the tongue 2 (two) times a week. , Disp: , Rfl:  ?  fluticasone (FLONASE) 50 MCG/ACT nasal spray, SPRAY 2 SPRAYS INTO EACH NOSTRIL EVERY DAY, Disp: 48 mL, Rfl: 1 ?  glucose blood (ONETOUCH VERIO) test strip, Use as instructed, Disp: 100 each, Rfl: 12 ?  loratadine (CLARITIN) 10 MG tablet, TAKE 1 TABLET BY MOUTH EVERY DAY, Disp: 90 tablet, Rfl: 1 ?  metFORMIN (GLUCOPHAGE) 500 MG tablet, Take 1 tablet (500 mg total) by mouth 2 (two) times daily., Disp: 180 tablet, Rfl: 1 ?  nadolol (CORGARD) 20 MG tablet, TAKE 1 TABLET BY MOUTH EVERY DAY, Disp: 90 tablet, Rfl: 0 ?  Olmesartan-amLODIPine-HCTZ  40-5-12.5 MG TABS, Take 1 tablet by mouth daily., Disp: 90 tablet, Rfl: 1 ?  triamcinolone cream (KENALOG) 0.1 %, Reported on 07/15/2015, Disp: , Rfl:  ? ?Allergies  ?Allergen Reactions  ? Duloxetine   ?  sweat  ? ? ?I personally reviewed active problem list, medication list, allergies, family history, social history, health maintenance with the patient/caregiver today. ? ? ?ROS ? ?Constitutional: Negative for fever or weight change.  ?Respiratory: Negative for cough and shortness of breath.   ?Cardiovascular: Negative for  chest pain or palpitations.  ?Gastrointestinal: Negative for abdominal pain, no bowel changes.  ?Musculoskeletal: Negative for gait problem or joint swelling.  ?Skin: Negative for rash.  ?Neurological: Negative for dizziness or headache.  ?No other specific complaints in a complete review of systems (except as listed in HPI above).  ? ?Objective ? ?Vitals:  ? 07/26/21 1053  ?BP: 134/80  ?Pulse: 80  ?Resp: 16  ?SpO2: 98%  ?Weight: 198 lb (89.8 kg)  ?Height: 5\' 2"  (1.575 m)  ? ? ?Body mass index is 36.21 kg/m?. ? ?Physical Exam ? ?Constitutional: Patient appears well-developed and well-nourished. Obese  No distress.  ?HEENT: head atraumatic, normocephalic, pupils equal and reactive to light,  neck supple ?Cardiovascular: Normal rate, regular rhythm and normal heart sounds.  No murmur heard. No BLE edema. ?Pulmonary/Chest: Effort normal and breath sounds normal. No respiratory distress. ?Abdominal: Soft.  There is no tenderness. ?Psychiatric: Patient has a normal mood and affect. behavior is normal. Judgment and thought content normal.  ? ?PHQ2/9: ? ?  07/26/2021  ? 10:49 AM 06/23/2021  ?  9:06 AM 03/28/2021  ? 10:56 AM 03/21/2021  ? 11:23 AM 01/26/2021  ? 10:21 AM  ?Depression screen PHQ 2/9  ?Decreased Interest 0 0 0 0 0  ?Down, Depressed, Hopeless 0 2 0 0 0  ?PHQ - 2 Score 0 2 0 0 0  ?Altered sleeping 0 0 0 0 0  ?Tired, decreased energy 0 3 3 3 3   ?Change in appetite 0 0 0 0 0  ?Feeling bad or failure  about yourself  0 0 0 0 0  ?Trouble concentrating 0 0 0 0 0  ?Moving slowly or fidgety/restless 0 0 0 0 0  ?Suicidal thoughts 0 0 0 0 0  ?PHQ-9 Score 0 5 3 3 3   ?  ?phq 9 is negative ? ? ?Fall Risk: ? ?  3/28

## 2021-07-26 ENCOUNTER — Ambulatory Visit: Payer: Medicare PPO | Admitting: Family Medicine

## 2021-07-26 ENCOUNTER — Encounter: Payer: Self-pay | Admitting: Family Medicine

## 2021-07-26 VITALS — BP 134/80 | HR 80 | Resp 16 | Ht 62.0 in | Wt 198.0 lb

## 2021-07-26 DIAGNOSIS — R739 Hyperglycemia, unspecified: Secondary | ICD-10-CM | POA: Diagnosis not present

## 2021-07-26 DIAGNOSIS — F325 Major depressive disorder, single episode, in full remission: Secondary | ICD-10-CM

## 2021-07-26 DIAGNOSIS — J302 Other seasonal allergic rhinitis: Secondary | ICD-10-CM

## 2021-07-26 DIAGNOSIS — I10 Essential (primary) hypertension: Secondary | ICD-10-CM | POA: Diagnosis not present

## 2021-07-26 DIAGNOSIS — E785 Hyperlipidemia, unspecified: Secondary | ICD-10-CM

## 2021-07-26 DIAGNOSIS — E559 Vitamin D deficiency, unspecified: Secondary | ICD-10-CM | POA: Diagnosis not present

## 2021-07-26 DIAGNOSIS — F411 Generalized anxiety disorder: Secondary | ICD-10-CM | POA: Diagnosis not present

## 2021-07-26 DIAGNOSIS — E538 Deficiency of other specified B group vitamins: Secondary | ICD-10-CM

## 2021-07-26 DIAGNOSIS — J3089 Other allergic rhinitis: Secondary | ICD-10-CM | POA: Diagnosis not present

## 2021-07-26 DIAGNOSIS — E8881 Metabolic syndrome: Secondary | ICD-10-CM

## 2021-07-26 MED ORDER — ATORVASTATIN CALCIUM 40 MG PO TABS: 40.0000 mg | ORAL_TABLET | Freq: Every evening | ORAL | 1 refills | Status: DC

## 2021-07-26 MED ORDER — LORATADINE 10 MG PO TABS: 10.0000 mg | ORAL_TABLET | Freq: Every day | ORAL | 1 refills | Status: DC

## 2021-07-26 MED ORDER — NADOLOL 40 MG PO TABS: 40.0000 mg | ORAL_TABLET | Freq: Every day | ORAL | 1 refills | Status: DC

## 2021-08-17 ENCOUNTER — Encounter: Payer: Self-pay | Admitting: Family Medicine

## 2021-08-17 ENCOUNTER — Ambulatory Visit: Payer: Medicare PPO | Admitting: Family Medicine

## 2021-08-17 VITALS — BP 130/72 | HR 79 | Resp 16 | Ht 62.0 in | Wt 196.0 lb

## 2021-08-17 DIAGNOSIS — M545 Low back pain, unspecified: Secondary | ICD-10-CM | POA: Diagnosis not present

## 2021-08-17 MED ORDER — LIDOCAINE 5 % EX PTCH: 1.0000 | MEDICATED_PATCH | CUTANEOUS | 0 refills | Status: DC

## 2021-08-17 MED ORDER — MELOXICAM 15 MG PO TABS: 15.0000 mg | ORAL_TABLET | Freq: Every day | ORAL | 0 refills | Status: DC

## 2021-08-17 NOTE — Progress Notes (Signed)
Name: Monique Baker   MRN: [id number]    DOB: [birth year]   Date:08/17/2021 ? ?     Progress Note ? ?Subjective ? ?Chief Complaint ? ?Low Back Pain ? ?HPI ? ?Acute low back pain: patient states she developed acute right lower back pain that radiates to right buttocks over the past week, pain initially was mild and intermittent but yesterday it was constant and intense to the point that she cried most of the day. Pain is described as aching but also numbness/tingling. It is worse with certain movements. Denies bowel or bladder incontinence, no rashes and denies any injuries or falls that could have triggered the pain. It is also aggravated by standing or walking for a prolonged period of time. She took two otc Aleve yesterday and applied hot compressed last night. Today the pain is not as intense. ? ? ?Patient Active Problem List  ? Diagnosis Date Noted  ? Osteoarthritis of hip 02/19/2018  ? Osteopenia of hip 09/04/2016  ? History of bunionectomy of left great toe 07/15/2015  ? H/O hammer toe correction 03/15/2015  ? 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 Benign essential HTN 11/05/2014  ? Carpal tunnel syndrome 11/05/2014  ? Osteoarthritis 11/05/2014  ? Dermatitis, eczematoid 11/05/2014  ? Depression, major, recurrent, mild ([facility]) 11/05/2014  ? Dyslipidemia 11/05/2014  ? Gastro-esophageal reflux disease without esophagitis 11/05/2014  ? Morbid obesity ([facility]) 11/05/2014  ? Dysmetabolic syndrome 11/05/2014  ? Menopausal and perimenopausal disorder 11/05/2014  ? Perennial allergic rhinitis with seasonal variation 11/05/2014  ? Seborrhea capitis 11/05/2014  ? Acquired trigger finger 11/05/2014  ? Urinary incontinence in female 11/05/2014  ? Vitamin D deficiency 11/05/2014  ? Blood glucose elevated 08/31/2009  ? ? ?Past Surgical History:  ?Procedure Laterality Date  ? ABDOMINAL HYSTERECTOMY    ? CATARACT EXTRACTION, BILATERAL    ? CESAREAN SECTION    ? COLONOSCOPY WITH PROPOFOL N/A 03/15/2016  ? Procedure: COLONOSCOPY WITH  PROPOFOL;  Surgeon: Kieth Brightly, MD;  Location: [facility];  Service: Endoscopy;  Laterality: N/A;  ? HALLUX VALGUS LAPIDUS Left 06/16/2015  ? Procedure: HALLUX VALGUS LAPIDUS;  Surgeon: Gwyneth Revels, DPM;  Location: [facility];  Service: Podiatry;  Laterality: Left;  WITH POPLITEAL  ? HAMMER TOE SURGERY Left 06/16/2015  ? Procedure: HAMMER TOE CORRECTION SECOND TOE LEFT;  Surgeon: Gwyneth Revels, DPM;  Location: [facility];  Service: Podiatry;  Laterality: Left;  prediabetic - on oral meds  ? hip replacemanet Right 03/15/2018  ? Fayrene Fearing Bowers-Emergeortho  ? MYOMECTOMY    ? OOPHORECTOMY    ? trigger finger Right 12/15/2014  ? Vulva cyst  11/2006  ? ? ?Family History  ?Problem Relation Age of Onset  ? Dementia Mother   ? Diabetes Father   ? Stroke Father   ? Cancer Daughter   ?     Fibroid Tumors  ? Diabetes Brother   ? Breast cancer Neg Hx   ? ? ?Social History  ? ?Tobacco Use  ? Smoking status: Never  ? Smokeless tobacco: Never  ? Tobacco comments:  ?  smoking cessation materials not required  ?Substance Use Topics  ? Alcohol use: No  ?  Alcohol/week: 0.0 standard drinks  ? ? ? ?Current Outpatient Medications:  ?  acetaminophen (TYLENOL) 500 MG tablet, Take by mouth. Reported on 11/15/2015, Disp: , Rfl:  ?  aspirin EC 81 MG tablet, Take 81 mg by mouth daily., Disp: , Rfl:  ?  atorvastatin (LIPITOR) 40 MG tablet,  Take 1 tablet (40 mg total) by mouth every evening. for cholesterol, Disp: 90 tablet, Rfl: 1 ?  Azelastine HCl 137 MCG/SPRAY SOLN, PLACE 2 SPRAYS INTO BOTH NOSTRILS 2 (TWO) TIMES DAILY. USE IN EACH NOSTRIL AS DIRECTED, Disp: 30 mL, Rfl: 1 ?  Cholecalciferol (VITAMIN D PO), Take 1 capsule by mouth daily. 3000 IU daily, Disp: , Rfl:  ?  citalopram (CELEXA) 20 MG tablet, TAKE 1 TABLET BY MOUTH EVERY DAY, Disp: 90 tablet, Rfl: 1 ?  Cyanocobalamin (B-12) 1000 MCG SUBL, Place 1 each under the tongue 2 (two) times a week. , Disp: , Rfl:  ?  fluticasone (FLONASE) 50 MCG/ACT nasal spray,  SPRAY 2 SPRAYS INTO EACH NOSTRIL EVERY DAY, Disp: 48 mL, Rfl: 1 ?  glucose blood (ONETOUCH VERIO) test strip, Use as instructed, Disp: 100 each, Rfl: 12 ?  lidocaine (LIDODERM) 5 %, Place 1 patch onto the skin daily. Remove & Discard patch within 12 hours or as directed by MD, Disp: 30 patch, Rfl: 0 ?  loratadine (CLARITIN) 10 MG tablet, Take 1 tablet (10 mg total) by mouth daily., Disp: 90 tablet, Rfl: 1 ?  meloxicam (MOBIC) 15 MG tablet, Take 1 tablet (15 mg total) by mouth daily., Disp: 30 tablet, Rfl: 0 ?  metFORMIN (GLUCOPHAGE) 500 MG tablet, Take 1 tablet (500 mg total) by mouth 2 (two) times daily., Disp: 180 tablet, Rfl: 1 ?  nadolol (CORGARD) 40 MG tablet, Take 1 tablet (40 mg total) by mouth daily., Disp: 90 tablet, Rfl: 1 ?  Olmesartan-amLODIPine-HCTZ 40-5-12.5 MG TABS, Take 1 tablet by mouth daily., Disp: 90 tablet, Rfl: 1 ?  triamcinolone cream (KENALOG) 0.1 %, Reported on 07/15/2015, Disp: , Rfl:  ? ?Allergies  ?Allergen Reactions  ? Duloxetine   ?  sweat  ? ? ?I personally reviewed active problem list, medication list, allergies, family history, social history, health maintenance with the patient/caregiver today. ? ? ?ROS ? ?Constitutional: Negative for fever or weight change.  ?Respiratory: Negative for cough and shortness of breath.   ?Cardiovascular: Negative for chest pain or palpitations.  ?Gastrointestinal: Negative for abdominal pain, no bowel changes.  ?Musculoskeletal: positive  for gait problem or joint swelling.  ?Skin: Negative for rash.  ?Neurological: Negative for dizziness or headache.  ?No other specific complaints in a complete review of systems (except as listed in HPI above).  ? ?Objective ? ?Vitals:  ? 08/17/21 1511  ?BP: 130/72  ?Pulse: 79  ?Resp: 16  ?SpO2: 97%  ?Weight: 196 lb (88.9 kg)  ?Height: 5\' 2"  (1.575 m)  ? ? ?Body mass index is 35.85 kg/m?. ? ?Physical Exam ? ?Constitutional: Patient appears well-developed and well-nourished. Obese No distress.  ?HEENT: head atraumatic,  normocephalic, pupils equal and reactive to light, neck supple, throat within normal limits ?Cardiovascular: Normal rate, regular rhythm and normal heart sounds.  No murmur heard. No BLE edema. ?Pulmonary/Chest: Effort normal and breath sounds normal. No respiratory distress. ?Abdominal: Soft.  There is no tenderness. ?Muscular Skeletal: tender during palpation of right lower back, knot present around SI joint, no rashes, pain aggravated by right lateral bending and palpation , normal patellar and achilles tendon reflexes , negative straight leg raise , gait slow but baseline for her  ?Psychiatric: Patient has a normal mood and affect. behavior is normal. Judgment and thought content normal.  ? ? ?PHQ2/9: ? ?  08/17/2021  ?  3:10 PM 07/26/2021  ? 10:49 AM 06/23/2021  ?  9:06 AM 03/28/2021  ? 10:56 AM 03/21/2021  ?  11:23 AM  ?Depression screen PHQ 2/9  ?Decreased Interest 0 0 0 0 0  ?Down, Depressed, Hopeless 1 0 2 0 0  ?PHQ - 2 Score 1 0 2 0 0  ?Altered sleeping 0 0 0 0 0  ?Tired, decreased energy 0 0 3 3 3   ?Change in appetite 0 0 0 0 0  ?Feeling bad or failure about yourself  0 0 0 0 0  ?Trouble concentrating 0 0 0 0 0  ?Moving slowly or fidgety/restless 0 0 0 0 0  ?Suicidal thoughts 0 0 0 0 0  ?PHQ-9 Score 1 0 5 3 3   ?  ?phq 9 is negative ? ? ?Fall Risk: ? ?  08/17/2021  ?  3:10 PM 07/26/2021  ? 10:49 AM 06/23/2021  ?  9:06 AM 03/28/2021  ? 10:56 AM 03/21/2021  ? 11:23 AM  ?Fall Risk   ?Falls in the past year? 0 0 1 1 1   ?Number falls in past yr: 0 0 1 0 0  ?Injury with Fall? 0 0 1 0 1  ?Risk for fall due to : No Fall Risks No Fall Risks No Fall Risks No Fall Risks No Fall Risks  ?Follow up Falls prevention discussed Falls prevention discussed Falls prevention discussed Falls prevention discussed Falls prevention discussed  ? ? ? ?Assessment & Plan ? ?1. Acute right-sided low back pain without sciatica ? ?May also take tylenol, discussed long term improvement with PT or chiropractor  ?Discussed red flags such as bowel  and bladder incontinence and core strength to decrease other flares. ? ?- meloxicam (MOBIC) 15 MG tablet; Take 1 tablet (15 mg total) by mouth daily.  Dispense: 30 tablet; Refill: 0 ?- lidocaine (LIDODE

## 2021-08-19 ENCOUNTER — Ambulatory Visit: Payer: Self-pay

## 2021-08-19 NOTE — Telephone Encounter (Signed)
?  Chief Complaint: Question - pt wanting to know if she can take IBU along with Mobic ?Symptoms: back pain 7/10 ?Frequency: ongoing ?Pertinent Negatives: Patient denies bowel or bladder problems ?Disposition: [] ED /[] Urgent Care (no appt availability in office) / [] Appointment(In office/virtual)/ []  [place] [facility]/ [] Home Care/ [] Refused Recommended Disposition /[] [facility]/ [x]  Follow-up with PCP ?Additional Notes: Per chart pt may take tylenol along with meloxicam for pain relief.  Pt will try this and call back if not effective. Pt also has a pt referral and will seek help from Chiropractic.  ? ? ? ? ?Summary: Advice on Meloxicam  ? Pt is calling to ask the nurse about back pain- pt took meloxicam (MOBIC) 15 MG tablet today at 9:30a, pt would like to know can she follow up with ibuprofen or can she follow up with another meloxicam this evening? Please advise   ?  ?Called pt. ?Reason for Disposition ? Caller has medicine question only, adult not sick, AND triager answers question ? ?Answer Assessment - Initial Assessment Questions ?1. NAME of MEDICATION: "What medicine are you calling about?" ?    IBU and Meloxicam together ?2. QUESTION: "What is your question?" (e.g., double dose of medicine, side effect) ?    Can these/should these be taken together ?3. PRESCRIBING HCP: "Who prescribed it?" Reason: if prescribed by specialist, call should be referred to that group. ?    Dr. ?4. SYMPTOMS: "Do you have any symptoms?" ?    Back pain ?5. SEVERITY: If symptoms are present, ask "Are they mild, moderate or severe?" ?    pain of 7/10 ?6. PREGNANCY:  "Is there any chance that you are pregnant?" "When was your last menstrual period?" ?    na ? ?Protocols used: Medication Question Call-A-AH ? ?

## 2021-08-28 ENCOUNTER — Other Ambulatory Visit: Payer: Self-pay | Admitting: Family Medicine

## 2021-08-28 DIAGNOSIS — F411 Generalized anxiety disorder: Secondary | ICD-10-CM

## 2021-08-28 DIAGNOSIS — F330 Major depressive disorder, recurrent, mild: Secondary | ICD-10-CM

## 2021-09-13 ENCOUNTER — Other Ambulatory Visit: Payer: Self-pay | Admitting: Family Medicine

## 2021-09-13 DIAGNOSIS — M545 Low back pain, unspecified: Secondary | ICD-10-CM

## 2021-09-14 ENCOUNTER — Ambulatory Visit
Admission: RE | Admit: 2021-09-14 | Discharge: 2021-09-14 | Disposition: A | Discharge status: Home / Self Care | Payer: Medicare PPO | Source: Ambulatory Visit | Attending: Family Medicine | Admitting: Family Medicine

## 2021-09-14 DIAGNOSIS — Z1231 Encounter for screening mammogram for malignant neoplasm of breast: Secondary | ICD-10-CM | POA: Insufficient documentation

## 2021-09-14 DIAGNOSIS — Z780 Asymptomatic menopausal state: Secondary | ICD-10-CM

## 2021-09-14 DIAGNOSIS — M8580 Other specified disorders of bone density and structure, unspecified site: Secondary | ICD-10-CM | POA: Insufficient documentation

## 2021-09-14 DIAGNOSIS — M85852 Other specified disorders of bone density and structure, left thigh: Secondary | ICD-10-CM | POA: Diagnosis not present

## 2021-09-15 ENCOUNTER — Other Ambulatory Visit: Payer: Medicare PPO

## 2021-10-14 ENCOUNTER — Other Ambulatory Visit: Payer: Self-pay | Admitting: Family Medicine

## 2021-10-14 DIAGNOSIS — F330 Major depressive disorder, recurrent, mild: Secondary | ICD-10-CM

## 2021-10-14 DIAGNOSIS — F411 Generalized anxiety disorder: Secondary | ICD-10-CM

## 2021-10-19 ENCOUNTER — Other Ambulatory Visit: Payer: Self-pay | Admitting: Family Medicine

## 2021-10-19 DIAGNOSIS — I10 Essential (primary) hypertension: Secondary | ICD-10-CM

## 2021-10-25 ENCOUNTER — Other Ambulatory Visit: Payer: Self-pay | Admitting: Family Medicine

## 2021-10-25 DIAGNOSIS — I10 Essential (primary) hypertension: Secondary | ICD-10-CM

## 2021-11-03 ENCOUNTER — Ambulatory Visit: Payer: Medicare PPO

## 2021-11-08 ENCOUNTER — Ambulatory Visit (INDEPENDENT_AMBULATORY_CARE_PROVIDER_SITE_OTHER): Payer: Medicare PPO | Admitting: Family Medicine

## 2021-11-08 ENCOUNTER — Encounter: Payer: Self-pay | Admitting: Family Medicine

## 2021-11-08 DIAGNOSIS — Z0000 Encounter for general adult medical examination without abnormal findings: Secondary | ICD-10-CM | POA: Diagnosis not present

## 2021-11-08 NOTE — Progress Notes (Addendum)
Annual Wellness Visit Telehealth via Phone Visit Amount of Time: 22 minutes  Patient: Monique Baker, Female    DOB: 1950/07/15, 71 y.o.   MRN: [id number]  Subjective  Chief Complaint  Patient presents with   Medicare Wellness    Monique Baker is a 71 y.o. female who presents today for her Annual Wellness Visit. She reports consuming a  Weight Watchers, low calorie diet. Gym/ health club routine includes cardio, light weights, and walking on track . She generally feels fairly well. She reports sleeping well. She does not have additional problems to discuss today.    Vision:April/May 2022, has upcoming appointment and Dental: No current dental problems and Receives regular dental care   Patient Active Problem List   Diagnosis Date Noted   Osteoarthritis of hip 02/19/2018   Osteopenia of hip 09/04/2016   History of bunionectomy of left great toe 07/15/2015   H/O hammer toe correction 03/15/2015   Arthritis of lumbar spine 01/19/2015   Benign essential HTN 11/05/2014   Carpal tunnel syndrome 11/05/2014   Osteoarthritis 11/05/2014   Dermatitis, eczematoid 11/05/2014   Depression, major, recurrent, mild (HCC) 11/05/2014   Dyslipidemia 11/05/2014   Gastro-esophageal reflux disease without esophagitis 11/05/2014   Morbid obesity (HCC) 11/05/2014   Dysmetabolic syndrome 11/05/2014   Menopausal and perimenopausal disorder 11/05/2014   Perennial allergic rhinitis with seasonal variation 11/05/2014   Seborrhea capitis 11/05/2014   Acquired trigger finger 11/05/2014   Urinary incontinence in female 11/05/2014   Vitamin D deficiency 11/05/2014   Blood glucose elevated 08/31/2009      Medications: Outpatient Medications Prior to Visit  Medication Sig   acetaminophen (TYLENOL) 500 MG tablet Take by mouth. Reported on 11/15/2015   aspirin EC 81 MG tablet Take 81 mg by mouth daily.   atorvastatin (LIPITOR) 40 MG tablet Take 1 tablet (40 mg total) by mouth every evening. for  cholesterol   Azelastine HCl 137 MCG/SPRAY SOLN PLACE 2 SPRAYS INTO BOTH NOSTRILS 2 (TWO) TIMES DAILY. USE IN EACH NOSTRIL AS DIRECTED   Cholecalciferol (VITAMIN D PO) Take 1 capsule by mouth daily. 3000 IU daily   citalopram (CELEXA) 20 MG tablet TAKE 1 TABLET BY MOUTH EVERY DAY   Cyanocobalamin (B-12) 1000 MCG SUBL Place 1 each under the tongue 2 (two) times a week.    fluticasone (FLONASE) 50 MCG/ACT nasal spray SPRAY 2 SPRAYS INTO EACH NOSTRIL EVERY DAY   glucose blood (ONETOUCH VERIO) test strip Use as instructed   loratadine (CLARITIN) 10 MG tablet Take 1 tablet (10 mg total) by mouth daily.   metFORMIN (GLUCOPHAGE) 500 MG tablet Take 1 tablet (500 mg total) by mouth 2 (two) times daily.   nadolol (CORGARD) 40 MG tablet Take 1 tablet (40 mg total) by mouth daily.   Olmesartan-amLODIPine-HCTZ 40-5-12.5 MG TABS TAKE 1 TABLET BY MOUTH EVERY DAY   triamcinolone cream (KENALOG) 0.1 % Reported on 07/15/2015   lidocaine (LIDODERM) 5 % Place 1 patch onto the skin daily. Remove & Discard patch within 12 hours or as directed by MD (Patient not taking: Reported on 11/08/2021)   [DISCONTINUED] meloxicam (MOBIC) 15 MG tablet TAKE 1 TABLET (15 MG TOTAL) BY MOUTH DAILY. (Patient not taking: Reported on 11/08/2021)   No facility-administered medications prior to visit.    Allergies  Allergen Reactions   Duloxetine     sweat    Patient Care Team: Alba Cory, MD as PCP - General (Family Medicine) [facility]  Objective  There were no vitals taken for this visit.   Physical Exam Entirety of visit conducted over the phone.  Speaks in full sentences, no respiratory distress.   Most recent functional status assessment:    11/08/2021    9:13 AM  In your present state of health, do you have any difficulty performing the following activities:  Hearing? 0  Vision? 0  Difficulty concentrating or making decisions? 0  Walking or climbing stairs? 0  Dressing or bathing? 0   Doing errands, shopping? 0   Most recent fall risk assessment:    11/08/2021    9:12 AM  Fall Risk   Falls in the past year? 0  Number falls in past yr: 0  Injury with Fall? 0  Risk for fall due to : No Fall Risks  Follow up Falls prevention discussed    Most recent depression screenings:    11/08/2021    9:13 AM 08/17/2021    3:10 PM  PHQ 2/9 Scores  PHQ - 2 Score 0 1  PHQ- 9 Score 0 1   Most recent cognitive screening:    11/08/2021   10:26 AM  6CIT Screen  What Year? 0 points  What month? 0 points  What time? 0 points  Count back from 20 0 points  Months in reverse 0 points  Repeat phrase 0 points  Total Score 0 points   Most recent Audit-C alcohol use screening    11/08/2021   10:26 AM  Alcohol Use Disorder Test (AUDIT)  1. How often do you have a drink containing alcohol? 0  2. How many drinks containing alcohol do you have on a typical day when you are drinking? 0  3. How often do you have six or more drinks on one occasion? 0  AUDIT-C Score 0   A score of 3 or more in women, and 4 or more in men indicates increased risk for alcohol abuse, EXCEPT if all of the points are from question 1   Vision/Hearing Screen: No results found.    No results found for any visits on 11/08/21.    Assessment & Plan   Annual wellness visit done today including the all of the following: Reviewed patient's Family Medical History Reviewed and updated list of patient's medical providers Assessment of cognitive impairment was done Assessed patient's functional ability Established a written schedule for health screening services Health Risk Assessent Completed and Reviewed  Advanced Care Planning: A voluntary discussion about advance care planning including the explanation and discussion of advance directives.  Discussed health care proxy and Living will, and the patient was able to identify a health care proxy as husband, Monique Baker.  Patient does not have a living will  at present time. If patient does have living will, I have requested they bring this to the clinic to be scanned in to their chart.  Exercise Activities and Dietary recommendations  Goals      DIET     Recommend to decrease portion sizes by eating 3 small healthy meals and at least 2 healthy snacks per day.     DIET - INCREASE WATER INTAKE     Recommend to drink at least 6-8 8oz glasses of water per day.     Weight (lb) < 170 lb (77.1 kg)     Pt would like to lose weight over the next year with weight watchers and physical activity.        Immunization History  Administered Date(s) Administered  Fluad Quad(high Dose 65+) 02/19/2019, 01/26/2021   Influenza, High Dose Seasonal PF 01/18/2017, 01/14/2018   Influenza,inj,Quad PF,6+ Mos 01/11/2015, 01/17/2016, 03/15/2020   Influenza-Unspecified 01/06/2014   PFIZER Comirnaty(Gray Top)Covid-19 Tri-Sucrose Vaccine 08/27/2020   PFIZER(Purple Top)SARS-COV-2 Vaccination 06/23/2019, 07/14/2019, 02/03/2020, 03/07/2021   Pneumococcal Conjugate-13 07/23/2017   Pneumococcal Polysaccharide-23 08/31/2009   Tdap 05/10/2010, 07/23/2017   Zoster Recombinat (Shingrix) 06/22/2021, 10/18/2021   Zoster, Live 12/28/2011    Health Maintenance  Topic Date Due   Pneumonia Vaccine 8+ Years old (3 - PPSV23 or PCV20) 07/24/2018   COVID-19 Vaccine (6 - Pfizer series) 05/02/2021   INFLUENZA VACCINE  11/29/2021   MAMMOGRAM  09/15/2022   COLONOSCOPY (Pts 45-12yrs Insurance coverage will need to be confirmed)  03/15/2026   TETANUS/TDAP  07/24/2027   DEXA SCAN  Completed   Hepatitis C Screening  Completed   Zoster Vaccines- Shingrix  Completed   HPV VACCINES  Aged Out    Discussed health benefits of physical activity, and encouraged her to engage in regular exercise appropriate for her age and condition.    Problem List Items Addressed This Visit   None Visit Diagnoses     Encounter for Medicare annual wellness exam    -  Primary       Return in  about 1 year (around 11/09/2022) for medicare wellness.     Caro Laroche, DO

## 2021-11-08 NOTE — Patient Instructions (Signed)
  Monique Baker , Thank you for taking time to come for your Medicare Wellness Visit. I appreciate your ongoing commitment to your health goals. Please review the following plan we discussed and let me know if I can assist you in the future.   These are the goals we discussed:  Goals      DIET     Recommend to decrease portion sizes by eating 3 small healthy meals and at least 2 healthy snacks per day.     DIET - INCREASE WATER INTAKE     Recommend to drink at least 6-8 8oz glasses of water per day.     Weight (lb) < 170 lb (77.1 kg)     Pt would like to lose weight over the next year with weight watchers and physical activity.        This is a list of the screening recommended for you and due dates:  Health Maintenance  Topic Date Due   Pneumonia Vaccine (3 - PPSV23 or PCV20) 07/24/2018   COVID-19 Vaccine (6 - Pfizer series) 05/02/2021   Flu Shot  11/29/2021   Mammogram  09/15/2022   Colon Cancer Screening  03/15/2026   Tetanus Vaccine  07/24/2027   DEXA scan (bone density measurement)  Completed   Hepatitis C Screening: USPSTF Recommendation to screen - Ages 85-79 yo.  Completed   Zoster (Shingles) Vaccine  Completed   HPV Vaccine  Aged Out   Here is an example of what a healthy plate looks like:    ? Make half your plate fruits and vegetables.     ? Focus on whole fruits.     ? Vary your veggies.  ? Make half your grains whole grains. -     ? Look for the word "whole" at the beginning of the ingredients list    ? Some whole-grain ingredients include whole oats, whole-wheat flour,        whole-grain corn, whole-grain brown rice, and whole rye.  ? Move to low-fat and fat-free milk or yogurt.  ? Vary your protein routine. - Meat, fish, poultry (chicken, [place]), eggs, beans (kidney, pinto), dairy.  ? Drink and eat less sodium, saturated fat, and added sugars.

## 2021-12-07 ENCOUNTER — Ambulatory Visit: Payer: Self-pay | Admitting: *Deleted

## 2021-12-07 NOTE — Telephone Encounter (Signed)
Message from Sabina sent at 12/07/2021  2:53 PM EDT  Summary: blood in urine   Patient thinks she may have an uti, patient states there are traces of blood in her urine x1d   Patient states she feels pain at the top of her pelvic area after urinating   Please fu w/ patient           Call History   Type Contact Phone/Fax User  12/07/2021 02:51 PM EDT Phone (Incoming) Monique Baker, Monique Baker (Self) [phone] Judie Petit) Donnelly Angelica R   Reason for Disposition  Blood in urine  (Exception: Could be normal menstrual bleeding.)  Answer Assessment - Initial Assessment Questions 1. COLOR of URINE: "Describe the color of the urine."  (e.g., tea-colored, pink, red, bloody) "Do you have blood clots in your urine?" (e.g., none, pea, grape, small coin)     Seeing blood in urine and having pain in upper pelvis. 2. ONSET: "When did the bleeding start?"      Last night when I got up and went to the bathroom.    I got up again and every time I urinate I see blood.   At the end of the stream I feel the pain at bottom of my stomach at my vaginal area.   3. EPISODES: "How many times has there been blood in the urine?" or "How many times today?"     Every time I pee since last night 4. PAIN with URINATION: "Is there any pain with passing your urine?" If Yes, ask: "How bad is the pain?"  (Scale 1-10; or mild, moderate, severe)    - MILD: Complains slightly about urination hurting.    - MODERATE: Interferes with normal activities.      - SEVERE: Excruciating, unwilling or unable to urinate because of the pain.      Yes 5. FEVER: "Do you have a fever?" If Yes, ask: "What is your temperature, how was it measured, and when did it start?"     Not asked  6. ASSOCIATED SYMPTOMS: "Are you passing urine more frequently than usual?"     No 7. OTHER SYMPTOMS: "Do you have any other symptoms?" (e.g., back/flank pain, abdomen pain, vomiting)     No abd pain or flank pain 8. PREGNANCY: "Is there any chance you are  pregnant?" "When was your last menstrual period?"     N/A  Protocols used: Urine - Blood In-A-AH

## 2021-12-07 NOTE — Telephone Encounter (Signed)
  Chief Complaint: blood in urine and pain in vaginal area after urinating Symptoms: blood in urine every she urinates.   Light red color Frequency: Since last night when she got up during the night Pertinent Negatives: Patient denies burning or flank pain Disposition: [] ED /[] Urgent Care (no appt availability in office) / [x] Appointment(In office/virtual)/ []  [facility]/ [] Home Care/ [] Refused Recommended Disposition /[] [facility]/ []  Follow-up with PCP Additional Notes: Appt. Made with for 12/08/2021 at 10:40.

## 2021-12-08 ENCOUNTER — Encounter: Payer: Self-pay | Admitting: Nurse Practitioner

## 2021-12-08 ENCOUNTER — Other Ambulatory Visit: Payer: Self-pay

## 2021-12-08 ENCOUNTER — Ambulatory Visit: Payer: Medicare PPO | Admitting: Nurse Practitioner

## 2021-12-08 VITALS — BP 124/74 | HR 80 | Temp 99.9°F | Resp 18 | Wt 201.9 lb

## 2021-12-08 DIAGNOSIS — R300 Dysuria: Secondary | ICD-10-CM | POA: Diagnosis not present

## 2021-12-08 DIAGNOSIS — R3129 Other microscopic hematuria: Secondary | ICD-10-CM | POA: Diagnosis not present

## 2021-12-08 DIAGNOSIS — R311 Benign essential microscopic hematuria: Secondary | ICD-10-CM | POA: Diagnosis not present

## 2021-12-08 LAB — POCT URINALYSIS DIPSTICK
Bilirubin, UA: NEGATIVE
Glucose, UA: NEGATIVE
Ketones, UA: NEGATIVE
Nitrite, UA: POSITIVE
Protein, UA: POSITIVE — AB
Spec Grav, UA: 1.02 (ref 1.010–1.025)
Urobilinogen, UA: 0.2 E.U./dL
pH, UA: 6 (ref 5.0–8.0)

## 2021-12-08 MED ORDER — NITROFURANTOIN MONOHYD MACRO 100 MG PO CAPS: 100.0000 mg | ORAL_CAPSULE | Freq: Two times a day (BID) | ORAL | 0 refills | Status: DC

## 2021-12-08 NOTE — Progress Notes (Signed)
BP 124/74   Pulse 80   Temp 99.9 F (37.7 C) (Oral)   Resp 18   Wt 201 lb 14.4 oz (91.6 kg)   SpO2 96%   BMI 36.93 kg/m    Subjective:    Patient ID: Monique Baker, female    DOB: 09-Feb-1951, 71 y.o.   MRN: [id number]  HPI: Monique Baker is a 71 y.o. female  Chief Complaint  Patient presents with   Urinary Tract Infection    Pain, burning, hematuria   Dysuria/hematuria: Patient reports symptoms started last night.  Patient states she noticed some blood in her urine and she is having some dysuria.  Patient denies any fever or back pain.  She was previously on macrobid in 2019 for her last UTI. Urine dip does show large blood, positive protein positive nitrates and large leukocytes.  Will start patient on Macrobid.  Will send urine for culture.  Discussed signs to watch out for to seek emergency care.  Patient verbalized understanding.  Relevant past medical, surgical, family and social history reviewed and updated as indicated. Interim medical history since our last visit reviewed. Allergies and medications reviewed and updated.  Review of Systems  Constitutional: Negative for fever or weight change.  Respiratory: Negative for cough and shortness of breath.   Cardiovascular: Negative for chest pain or palpitations.  Gastrointestinal: Negative for abdominal pain, no bowel changes. GU: Positive for dysuria and hematuria Musculoskeletal: Negative for gait problem or joint swelling.  Skin: Negative for rash.  Neurological: Negative for dizziness or headache.  No other specific complaints in a complete review of systems (except as listed in HPI above).      Objective:    BP 124/74   Pulse 80   Temp 99.9 F (37.7 C) (Oral)   Resp 18   Wt 201 lb 14.4 oz (91.6 kg)   SpO2 96%   BMI 36.93 kg/m   Wt Readings from Last 3 Encounters:  12/08/21 201 lb 14.4 oz (91.6 kg)  08/17/21 196 lb (88.9 kg)  07/26/21 198 lb (89.8 kg)    Physical Exam  Constitutional: Patient appears  well-developed and well-nourished. Obese  No distress.  HEENT: head atraumatic, normocephalic, pupils equal and reactive to light,  neck supple Cardiovascular: Normal rate, regular rhythm and normal heart sounds.  No murmur heard. No BLE edema. Pulmonary/Chest: Effort normal and breath sounds normal. No respiratory distress. Abdominal: Soft.  There is no tenderness.  No CVA tenderness Psychiatric: Patient has a normal mood and affect. behavior is normal. Judgment and thought content normal.  Results for orders placed or performed in visit on 12/08/21  POCT urinalysis dipstick  Result Value Ref Range   Color, UA yellow    Clarity, UA cloudy    Glucose, UA Negative Negative   Bilirubin, UA negative    Ketones, UA negative    Spec Grav, UA 1.020 1.010 - 1.025   Blood, UA large    pH, UA 6.0 5.0 - 8.0   Protein, UA Positive (A) Negative   Urobilinogen, UA 0.2 0.2 or 1.0 E.U./dL   Nitrite, UA positive    Leukocytes, UA Large (3+) (A) Negative   Appearance couldy    Odor none       Assessment & Plan:   Problem List Items Addressed This Visit   None Visit Diagnoses     Dysuria    -  Primary   Will start Macrobid and send urine for culture.  Push fluids.  Relevant Medications   nitrofurantoin, macrocrystal-monohydrate, (MACROBID) 100 MG capsule   Other Relevant Orders   POCT urinalysis dipstick (Completed)   Urine Culture   Other microscopic hematuria       Will start Macrobid and send urine for culture.  Push fluids.        Follow up plan: Return if symptoms worsen or fail to improve.

## 2021-12-10 LAB — URINE CULTURE
MICRO NUMBER:: 13763025
SPECIMEN QUALITY:: ADEQUATE

## 2021-12-23 ENCOUNTER — Other Ambulatory Visit: Payer: Self-pay | Admitting: Family Medicine

## 2021-12-23 DIAGNOSIS — R739 Hyperglycemia, unspecified: Secondary | ICD-10-CM

## 2021-12-23 DIAGNOSIS — E8881 Metabolic syndrome: Secondary | ICD-10-CM

## 2022-01-25 ENCOUNTER — Ambulatory Visit: Payer: Medicare PPO | Admitting: Family Medicine

## 2022-01-31 NOTE — Progress Notes (Unsigned)
Name: Monique Baker   MRN: [id number]    DOB: August 10, 1950   Date:02/01/2022       Progress Note  Subjective  Chief Complaint  Follow Up  HPI  HTN: doing well on current regiment, bp has been well controlled, no recent episodes of chest pain or palpitation   Depression Major: she is married to a Theme park manager, he also works as a Network engineer at [employer] We started her on Cymbalta 10/2015 was doing better but caused hair loss, currently on Citalopram but weaned self off and is now feeling down again. Lots of death in her family including her step- niece that overdose of fentanyl .    Pre diabetes/metabolic syndrome: she is taking Metformin, denies side effects, hgbA1C has been stable at 5.6 %  she denies  polyphagia, polydipsia or polyuria.  she has been going to the gym, not very compliant with her diet lately, explained wellbutrin may help curb her appetite    Right hip pain/OA: she had right hip replacement back in Nov 2019 by Dr. Harlow Mares She is now pain free, no longer using a cane , not taking any medications for it at this time    Morbid obesity:   she joined weight watchers 05/2016 at a weight of 224 lbs  Weight has been stable for the  past couple of years.  She is back on Weight Watchers at the end of 2022 and was down to 198 lbs. She is also going to the gym. She gained weight since last visit. Discussed going back on Wellbutrin and she is okay resuming medication since feeling down again    AR: she has been more congested lately, some rhinorrhea, taking loratadine, not using flonase due to nose bleed, we will try sending another rx of astelin    Patient Active Problem List   Diagnosis Date Noted   Osteoarthritis of hip 02/19/2018   Osteopenia of hip 09/04/2016   History of bunionectomy of left great toe 07/15/2015   H/O hammer toe correction 03/15/2015   Arthritis of lumbar spine 01/19/2015   Benign essential HTN 11/05/2014   Carpal tunnel syndrome 11/05/2014   Osteoarthritis  11/05/2014   Dermatitis, eczematoid 11/05/2014   Depression, major, recurrent, mild ([place]) 11/05/2014   Dyslipidemia 11/05/2014   Gastro-esophageal reflux disease without esophagitis 11/05/2014   Morbid obesity ([place]) 123XX123   Dysmetabolic syndrome 123XX123   Menopausal and perimenopausal disorder 11/05/2014   Perennial allergic rhinitis with seasonal variation 11/05/2014   Seborrhea capitis 11/05/2014   Acquired trigger finger 11/05/2014   Urinary incontinence in female 11/05/2014   Vitamin D deficiency 11/05/2014   Blood glucose elevated 08/31/2009    Past Surgical History:  Procedure Laterality Date   ABDOMINAL HYSTERECTOMY     CATARACT EXTRACTION, BILATERAL     CESAREAN SECTION     COLONOSCOPY WITH PROPOFOL N/A 03/15/2016   Procedure: COLONOSCOPY WITH PROPOFOL;  Surgeon: Christene Lye, MD;  Location: [facility];  Service: Endoscopy;  Laterality: N/A;   HALLUX VALGUS LAPIDUS Left 06/16/2015   Procedure: HALLUX VALGUS LAPIDUS;  Surgeon: Samara Deist, DPM;  Location: [place];  Service: Podiatry;  Laterality: Left;  WITH POPLITEAL   HAMMER TOE SURGERY Left 06/16/2015   Procedure: HAMMER TOE CORRECTION SECOND TOE LEFT;  Surgeon: Samara Deist, DPM;  Location: [place];  Service: Podiatry;  Laterality: Left;  prediabetic - on oral meds   hip replacemanet Right 03/15/2018   Jeneen Rinks Bowers-Emergeortho   MYOMECTOMY     OOPHORECTOMY  trigger finger Right 12/15/2014   Vulva cyst  11/2006    Family History  Problem Relation Age of Onset   Dementia Mother    Diabetes Father    Stroke Father    Cancer Daughter        Fibroid Tumors   Diabetes Brother    Breast cancer Neg Hx     Social History   Tobacco Use   Smoking status: Never   Smokeless tobacco: Never   Tobacco comments:    smoking cessation materials not required  Substance Use Topics   Alcohol use: No    Alcohol/week: 0.0 standard drinks of alcohol     Current Outpatient  Medications:    acetaminophen (TYLENOL) 500 MG tablet, Take by mouth. Reported on 11/15/2015, Disp: , Rfl:    aspirin EC 81 MG tablet, Take 81 mg by mouth daily., Disp: , Rfl:    atorvastatin (LIPITOR) 40 MG tablet, Take 1 tablet (40 mg total) by mouth every evening. for cholesterol, Disp: 90 tablet, Rfl: 1   Azelastine HCl 137 MCG/SPRAY SOLN, PLACE 2 SPRAYS INTO BOTH NOSTRILS 2 (TWO) TIMES DAILY. USE IN EACH NOSTRIL AS DIRECTED, Disp: 30 mL, Rfl: 1   Cholecalciferol (VITAMIN D PO), Take 1 capsule by mouth daily. 3000 IU daily, Disp: , Rfl:    citalopram (CELEXA) 20 MG tablet, TAKE 1 TABLET BY MOUTH EVERY DAY, Disp: 90 tablet, Rfl: 1   Cyanocobalamin (B-12) 1000 MCG SUBL, Place 1 each under the tongue 2 (two) times a week. , Disp: , Rfl:    glucose blood (ONETOUCH VERIO) test strip, Use as instructed, Disp: 100 each, Rfl: 12   loratadine (CLARITIN) 10 MG tablet, Take 1 tablet (10 mg total) by mouth daily., Disp: 90 tablet, Rfl: 1   metFORMIN (GLUCOPHAGE) 500 MG tablet, TAKE 1 TABLET BY MOUTH TWICE A DAY, Disp: 180 tablet, Rfl: 0   nadolol (CORGARD) 40 MG tablet, Take 1 tablet (40 mg total) by mouth daily., Disp: 90 tablet, Rfl: 1   Olmesartan-amLODIPine-HCTZ 40-5-12.5 MG TABS, TAKE 1 TABLET BY MOUTH EVERY DAY, Disp: 90 tablet, Rfl: 1   triamcinolone cream (KENALOG) 0.1 %, Reported on 07/15/2015, Disp: , Rfl:   Allergies  Allergen Reactions   Duloxetine     sweat    I personally reviewed active problem list, medication list, allergies, family history, social history, health maintenance with the patient/caregiver today.   ROS  Constitutional: Negative for fever , positive for  weight change.  Respiratory: Negative for cough and shortness of breath.   Cardiovascular: Negative for chest pain or palpitations.  Gastrointestinal: Negative for abdominal pain, no bowel changes.  Musculoskeletal: Negative for gait problem or joint swelling.  Skin: Negative for rash.  Neurological: Negative for  dizziness or headache.  No other specific complaints in a complete review of systems (except as listed in HPI above).   Objective  Vitals:   02/01/22 1126  BP: 126/80  Pulse: 73  Resp: 16  SpO2: 99%  Weight: 205 lb (93 kg)  Height: 5\' 2"  (1.575 m)    Body mass index is 37.49 kg/m.  Physical Exam  Constitutional: Patient appears well-developed and well-nourished. Obese  No distress.  HEENT: head atraumatic, normocephalic, pupils equal and reactive to light, neck supple Cardiovascular: Normal rate, regular rhythm and normal heart sounds.  No murmur heard. No BLE edema. Pulmonary/Chest: Effort normal and breath sounds normal. No respiratory distress. Abdominal: Soft.  There is no tenderness. Psychiatric: Patient has a normal mood and  affect. behavior is normal. Judgment and thought content normal.     PHQ2/9:    02/01/2022   11:16 AM 12/08/2021   10:29 AM 11/08/2021    9:13 AM 08/17/2021    3:10 PM 07/26/2021   10:49 AM  Depression screen PHQ 2/9  Decreased Interest 1 0 0 0 0  Down, Depressed, Hopeless 1 0 0 1 0  PHQ - 2 Score 2 0 0 1 0  Altered sleeping 0  0 0 0  Tired, decreased energy 2  0 0 0  Change in appetite 0  0 0 0  Feeling bad or failure about yourself  0  0 0 0  Trouble concentrating 0  0 0 0  Moving slowly or fidgety/restless 0  0 0 0  Suicidal thoughts 0  0 0 0  PHQ-9 Score 4  0 1 0    phq 9 is positive   Fall Risk:    02/01/2022   11:16 AM 12/08/2021   10:29 AM 11/08/2021    9:12 AM 08/17/2021    3:10 PM 07/26/2021   10:49 AM  Fall Risk   Falls in the past year? 0 0 0 0 0  Number falls in past yr: 0 0 0 0 0  Injury with Fall? 0 0 0 0 0  Risk for fall due to : No Fall Risks  No Fall Risks No Fall Risks No Fall Risks  Follow up Falls prevention discussed Falls evaluation completed Falls prevention discussed Falls prevention discussed Falls prevention discussed      Functional Status Survey: Is the patient deaf or have difficulty hearing?:  No Does the patient have difficulty seeing, even when wearing glasses/contacts?: No Does the patient have difficulty concentrating, remembering, or making decisions?: No Does the patient have difficulty walking or climbing stairs?: No Does the patient have difficulty dressing or bathing?: No Does the patient have difficulty doing errands alone such as visiting a doctor's office or shopping?: No    Assessment & Plan  1. Depression, major, recurrent, mild (HCC)  - citalopram (CELEXA) 20 MG tablet; Take 1 tablet (20 mg total) by mouth daily.  Dispense: 90 tablet; Refill: 1  2. Morbid obesity (HCC)  Discussed with the patient the risk posed by an increased BMI. Discussed importance of portion control, calorie counting and at least 150 minutes of physical activity weekly. Avoid sweet beverages and drink more water. Eat at least 6 servings of fruit and vegetables daily    Discussed MyFitnessPal   3. Major depression in remission (HCC)   4. Need for pneumococcal 20-valent conjugate vaccination  - Pneumococcal conjugate vaccine 20-valent (Prevnar 20)  5. Need for immunization against influenza  - Flu Vaccine QUAD High Dose(Fluad)  6. GAD (generalized anxiety disorder)  - citalopram (CELEXA) 20 MG tablet; Take 1 tablet (20 mg total) by mouth daily.  Dispense: 90 tablet; Refill: 1  7. Dysmetabolic syndrome  - metFORMIN (GLUCOPHAGE) 500 MG tablet; Take 1 tablet (500 mg total) by mouth 2 (two) times daily.  Dispense: 180 tablet; Refill: 1  8. Hyperglycemia  - metFORMIN (GLUCOPHAGE) 500 MG tablet; Take 1 tablet (500 mg total) by mouth 2 (two) times daily.  Dispense: 180 tablet; Refill: 1 - Hemoglobin A1c  9. B12 deficiency  - CBC with Differential/Platelet - Vitamin B12  10. Dyslipidemia  - Lipid panel  11. Perennial allergic rhinitis with seasonal variation  - Azelastine HCl 137 MCG/SPRAY SOLN; PLACE 2 SPRAYS INTO BOTH NOSTRILS 2 (TWO) TIMES DAILY.  USE IN EACH NOSTRIL AS  DIRECTED  Dispense: 30 mL; Refill: 1  12. Vitamin D deficiency  - VITAMIN D 25 Hydroxy (Vit-D Deficiency, Fractures)  13. Long-term use of high-risk medication  - COMPLETE METABOLIC PANEL WITH GFR

## 2022-02-01 ENCOUNTER — Encounter: Payer: Self-pay | Admitting: Family Medicine

## 2022-02-01 ENCOUNTER — Ambulatory Visit: Payer: Medicare PPO | Admitting: Family Medicine

## 2022-02-01 VITALS — BP 126/80 | HR 73 | Resp 16 | Ht 62.0 in | Wt 205.0 lb

## 2022-02-01 DIAGNOSIS — E559 Vitamin D deficiency, unspecified: Secondary | ICD-10-CM

## 2022-02-01 DIAGNOSIS — F330 Major depressive disorder, recurrent, mild: Secondary | ICD-10-CM | POA: Diagnosis not present

## 2022-02-01 DIAGNOSIS — E538 Deficiency of other specified B group vitamins: Secondary | ICD-10-CM | POA: Diagnosis not present

## 2022-02-01 DIAGNOSIS — R739 Hyperglycemia, unspecified: Secondary | ICD-10-CM

## 2022-02-01 DIAGNOSIS — Z23 Encounter for immunization: Secondary | ICD-10-CM

## 2022-02-01 DIAGNOSIS — Z79899 Other long term (current) drug therapy: Secondary | ICD-10-CM

## 2022-02-01 DIAGNOSIS — F325 Major depressive disorder, single episode, in full remission: Secondary | ICD-10-CM | POA: Diagnosis not present

## 2022-02-01 DIAGNOSIS — F411 Generalized anxiety disorder: Secondary | ICD-10-CM

## 2022-02-01 DIAGNOSIS — E8881 Metabolic syndrome: Secondary | ICD-10-CM | POA: Diagnosis not present

## 2022-02-01 DIAGNOSIS — Z6837 Body mass index (BMI) 37.0-37.9, adult: Secondary | ICD-10-CM

## 2022-02-01 DIAGNOSIS — J3089 Other allergic rhinitis: Secondary | ICD-10-CM | POA: Diagnosis not present

## 2022-02-01 DIAGNOSIS — E785 Hyperlipidemia, unspecified: Secondary | ICD-10-CM | POA: Diagnosis not present

## 2022-02-01 DIAGNOSIS — J302 Other seasonal allergic rhinitis: Secondary | ICD-10-CM

## 2022-02-01 MED ORDER — CITALOPRAM HYDROBROMIDE 20 MG PO TABS: 20.0000 mg | ORAL_TABLET | Freq: Every day | ORAL | 1 refills | Status: DC

## 2022-02-01 MED ORDER — AZELASTINE HCL 137 MCG/SPRAY NA SOLN: NASAL | 1 refills | Status: DC

## 2022-02-01 MED ORDER — METFORMIN HCL 500 MG PO TABS: 500.0000 mg | ORAL_TABLET | Freq: Two times a day (BID) | ORAL | 1 refills | Status: DC

## 2022-02-01 MED ORDER — BUPROPION HCL ER (XL) 150 MG PO TB24: 150.0000 mg | ORAL_TABLET | Freq: Every day | ORAL | 1 refills | Status: DC

## 2022-02-02 DIAGNOSIS — R739 Hyperglycemia, unspecified: Secondary | ICD-10-CM | POA: Diagnosis not present

## 2022-02-02 DIAGNOSIS — E538 Deficiency of other specified B group vitamins: Secondary | ICD-10-CM | POA: Diagnosis not present

## 2022-02-02 DIAGNOSIS — E559 Vitamin D deficiency, unspecified: Secondary | ICD-10-CM | POA: Diagnosis not present

## 2022-02-02 DIAGNOSIS — Z79899 Other long term (current) drug therapy: Secondary | ICD-10-CM | POA: Diagnosis not present

## 2022-02-02 DIAGNOSIS — E785 Hyperlipidemia, unspecified: Secondary | ICD-10-CM | POA: Diagnosis not present

## 2022-02-03 LAB — COMPLETE METABOLIC PANEL WITH GFR
AG Ratio: 1.3 (calc) (ref 1.0–2.5)
ALT: 10 U/L (ref 6–29)
AST: 14 U/L (ref 10–35)
Albumin: 4 g/dL (ref 3.6–5.1)
Alkaline phosphatase (APISO): 72 U/L (ref 37–153)
BUN/Creatinine Ratio: 14 (calc) (ref 6–22)
BUN: 15 mg/dL (ref 7–25)
CO2: 28 mmol/L (ref 20–32)
Calcium: 10 mg/dL (ref 8.6–10.4)
Chloride: 102 mmol/L (ref 98–110)
Creat: 1.1 mg/dL — ABNORMAL HIGH (ref 0.60–1.00)
Globulin: 3.1 g/dL (calc) (ref 1.9–3.7)
Glucose, Bld: 88 mg/dL (ref 65–99)
Potassium: 4.1 mmol/L (ref 3.5–5.3)
Sodium: 141 mmol/L (ref 135–146)
Total Bilirubin: 0.8 mg/dL (ref 0.2–1.2)
Total Protein: 7.1 g/dL (ref 6.1–8.1)
eGFR: 54 mL/min/{1.73_m2} — ABNORMAL LOW (ref >=60)

## 2022-02-03 LAB — CBC WITH DIFFERENTIAL/PLATELET
Absolute Monocytes: 969 cells/uL — ABNORMAL HIGH (ref 200–950)
Basophils Absolute: 48 cells/uL (ref 0–200)
Basophils Relative: 0.5 %
Eosinophils Absolute: 228 cells/uL (ref 15–500)
Eosinophils Relative: 2.4 %
HCT: 38 % (ref 35.0–45.0)
Hemoglobin: 12.2 g/dL (ref 11.7–15.5)
Lymphs Abs: 998 cells/uL (ref 850–3900)
MCH: 27.4 pg (ref 27.0–33.0)
MCHC: 32.1 g/dL (ref 32.0–36.0)
MCV: 85.4 fL (ref 80.0–100.0)
MPV: 9.7 fL (ref 7.5–12.5)
Monocytes Relative: 10.2 %
Neutro Abs: 7258 cells/uL (ref 1500–7800)
Neutrophils Relative %: 76.4 %
Platelets: 389 10*3/uL (ref 140–400)
RBC: 4.45 10*6/uL (ref 3.80–5.10)
RDW: 13.4 % (ref 11.0–15.0)
Total Lymphocyte: 10.5 %
WBC: 9.5 10*3/uL (ref 3.8–10.8)

## 2022-02-03 LAB — LIPID PANEL
Cholesterol: 127 mg/dL (ref <200)
HDL: 56 mg/dL (ref >=50)
LDL Cholesterol (Calc): 57 mg/dL (calc)
Non-HDL Cholesterol (Calc): 71 mg/dL (calc) (ref <130)
Total CHOL/HDL Ratio: 2.3 (calc) (ref <5.0)
Triglycerides: 67 mg/dL (ref <150)

## 2022-02-03 LAB — HEMOGLOBIN A1C
Hgb A1c MFr Bld: 5.8 % of total Hgb — ABNORMAL HIGH (ref <5.7)
Mean Plasma Glucose: 120 mg/dL
eAG (mmol/L): 6.6 mmol/L

## 2022-02-03 LAB — VITAMIN D 25 HYDROXY (VIT D DEFICIENCY, FRACTURES): Vit D, 25-Hydroxy: 58 ng/mL (ref 30–100)

## 2022-02-03 LAB — VITAMIN B12: Vitamin B-12: 686 pg/mL (ref 200–1100)

## 2022-02-14 DIAGNOSIS — Z0100 Encounter for examination of eyes and vision without abnormal findings: Secondary | ICD-10-CM | POA: Diagnosis not present

## 2022-02-14 DIAGNOSIS — H43813 Vitreous degeneration, bilateral: Secondary | ICD-10-CM | POA: Diagnosis not present

## 2022-02-24 ENCOUNTER — Other Ambulatory Visit: Payer: Self-pay | Admitting: Family Medicine

## 2022-02-24 DIAGNOSIS — J302 Other seasonal allergic rhinitis: Secondary | ICD-10-CM

## 2022-02-24 NOTE — Telephone Encounter (Signed)
Wants 90 day

## 2022-04-11 ENCOUNTER — Ambulatory Visit (LOCAL_COMMUNITY_HEALTH_CENTER): Payer: Medicare PPO

## 2022-04-11 DIAGNOSIS — Z23 Encounter for immunization: Secondary | ICD-10-CM

## 2022-04-11 DIAGNOSIS — Z719 Counseling, unspecified: Secondary | ICD-10-CM

## 2022-04-11 NOTE — Progress Notes (Signed)
Client seen in nurse clinic for COVID-19 vaccine.    VIS given.   Screening questions: Are you feeling sick today? No   Have you ever received a dose of COVID-19 Vaccine? [employer], [place], [place], [place], Other) Yes  If yes, which vaccine and how many doses?    5 Pfizer   Did you bring the vaccination record card or other documentation?  Yes   Do you have a health condition or are undergoing treatment that makes you moderately or severely immunocompromised? This would include, but not be limited to: cancer, HIV, organ transplant, immunosuppressive therapy/high-dose corticosteroids, or moderate/severe primary immunodeficiency.  No  Have you received COVID-19 vaccine before or during hematopoietic cell transplant (HCT) or CAR-T-cell therapies? No  Have you ever had an allergic reaction to: (This would include a severe allergic reaction or a reaction that caused hives, swelling, or respiratory distress, including wheezing.) A component of a COVID-19 vaccine or a previous dose of COVID-19 vaccine? No   Have you ever had an allergic reaction to another vaccine (other thanCOVID-19 vaccine) or an injectable medication? (This would include a severe allergic reaction or a reaction that caused hives, swelling, or respiratory distress, including wheezing.)   No    Do you have a history of any of the following:  Myocarditis or Pericarditis No  Dermal fillers:  No  Multisystem Inflammatory Syndrome (MIS-C or MIS-A)? No  COVID-19 disease within the past 3 months? No  Vaccinated with monkeypox vaccine in the last 4 weeks? No   Vaccine administered and tolerated well, after vaccine care reviewed. Copy of [facility] provided   .me

## 2022-04-15 ENCOUNTER — Other Ambulatory Visit: Payer: Self-pay | Admitting: Family Medicine

## 2022-04-15 DIAGNOSIS — I10 Essential (primary) hypertension: Secondary | ICD-10-CM

## 2022-04-15 DIAGNOSIS — E785 Hyperlipidemia, unspecified: Secondary | ICD-10-CM

## 2022-04-29 ENCOUNTER — Other Ambulatory Visit: Payer: Self-pay | Admitting: Family Medicine

## 2022-04-29 DIAGNOSIS — I10 Essential (primary) hypertension: Secondary | ICD-10-CM

## 2022-04-30 NOTE — Telephone Encounter (Signed)
Requested Prescriptions  Pending Prescriptions Disp Refills   nadolol (CORGARD) 40 MG tablet [Pharmacy Med Name: NADOLOL 40 MG TABLET] 90 tablet 1    Sig: TAKE 1 TABLET BY MOUTH EVERY DAY     Cardiovascular: Beta Blockers 2 Failed - 04/29/2022  8:58 AM      Failed - Cr in normal range and within 360 days    Creat  Date Value Ref Range Status  02/02/2022 1.10 (H) 0.60 - 1.00 mg/dL Final         Passed - Last BP in normal range    BP Readings from Last 1 Encounters:  02/01/22 126/80         Passed - Last Heart Rate in normal range    Pulse Readings from Last 1 Encounters:  02/01/22 73         Passed - Valid encounter within last 6 months    Recent Outpatient Visits           2 months ago Depression, major, recurrent, mild (HCC)   [facility] Alba Cory, MD   4 months ago Dysuria   [facility] Berniece Salines, FNP   5 months ago Encounter for [employer] annual wellness exam   [facility] Dense M, DO   8 months ago Acute right-sided low back pain without sciatica   [facility] Alba Cory, MD   9 months ago Major depression in remission [facility])   [facility] Alba Cory, MD       Future Appointments             In 1 week Alba Cory, MD [facility]   In 6 months Alba Cory, MD [facility]

## 2022-05-10 ENCOUNTER — Encounter: Payer: Medicare PPO | Admitting: Family Medicine

## 2022-05-22 NOTE — Progress Notes (Signed)
Name: Monique Baker   MRN: [id number]    DOB: 1951-03-07   Date:05/23/2022       Progress Note  Subjective  Chief Complaint  Follow Up  HPI  HTN: doing well on current regiment, bp has been well controlled, no recent episodes of chest pain or palpitation . No side effects   Depression Major in remission : she is married to a Theme park manager, he also works as a Network engineer at [employer] We started her on Cymbalta 10/2015 was doing better but caused hair loss, currently on Citalopram band tried to wean self off last year but symptoms returned, she is back on Citalopram daily . She is taking care of grandkids after school and also going to the gym. Phq 9 is negative today   Pre diabetes/metabolic syndrome: she is taking Metformin, denies side effects, hgbA1C went up to 5.8 % reminded her of low carbohydrate diet    Right hip pain/OA: she had right hip replacement back in Nov 2019 by Dr. Harlow Mares She is now pain free, no longer using a cane , not taking any medications for it at this time , reminded her to avoid NSAID's  Chronic kidney disease stage III a: we will recheck labs today, advised to avoid NSAID's.   Recent Fall: she was changing the linens on her bed yesterday, her right foot got caught on the linens on the floor and she fell on left side and hit the knee on the ground and left hip. She has hard wood floors. She is walking without pain but has mild effusion of left knee No redness. Advised ice, elevation and tylenol    Morbid obesity: BMI over 35 with co-morbidities such as HTN, CKI and prediabetes ,   she joined weight watchers 05/2016 at a weight of 224 lbs  She stopped going for a while but has been back to  [employer] at the end of 2022 and weight is down to 202 lbs. She is also going to the gym a couple of a times a week and spends about 2 hours when she goes    AR: she has been more congested lately, some rhinorrhea, taking loratadine, not using flonase due to nose bleed,  uses astelin prn    Patient Active Problem List   Diagnosis Date Noted   Osteoarthritis of hip 02/19/2018   Osteopenia of hip 09/04/2016   History of bunionectomy of left great toe 07/15/2015   H/O hammer toe correction 03/15/2015   Arthritis of lumbar spine 01/19/2015   Benign essential HTN 11/05/2014   Carpal tunnel syndrome 11/05/2014   Osteoarthritis 11/05/2014   Dermatitis, eczematoid 11/05/2014   Depression, major, recurrent, mild ([place]) 11/05/2014   Dyslipidemia 11/05/2014   Gastro-esophageal reflux disease without esophagitis 11/05/2014   Morbid obesity ([place]) 81/82/9937   Dysmetabolic syndrome 16/96/7893   Menopausal and perimenopausal disorder 11/05/2014   Perennial allergic rhinitis with seasonal variation 11/05/2014   Seborrhea capitis 11/05/2014   Acquired trigger finger 11/05/2014   Urinary incontinence in female 11/05/2014   Vitamin D deficiency 11/05/2014   Blood glucose elevated 08/31/2009    Past Surgical History:  Procedure Laterality Date   ABDOMINAL HYSTERECTOMY     CATARACT EXTRACTION, BILATERAL     CESAREAN SECTION     COLONOSCOPY WITH PROPOFOL N/A 03/15/2016   Procedure: COLONOSCOPY WITH PROPOFOL;  Surgeon: Christene Lye, MD;  Location: [facility];  Service: Endoscopy;  Laterality: N/A;   HALLUX VALGUS LAPIDUS Left 06/16/2015   Procedure: HALLUX  VALGUS LAPIDUS;  Surgeon: Samara Deist, DPM;  Location: [place];  Service: Podiatry;  Laterality: Left;  WITH POPLITEAL   HAMMER TOE SURGERY Left 06/16/2015   Procedure: HAMMER TOE CORRECTION SECOND TOE LEFT;  Surgeon: Samara Deist, DPM;  Location: [place];  Service: Podiatry;  Laterality: Left;  prediabetic - on oral meds   hip replacemanet Right 03/15/2018   Jeneen Rinks Bowers-Emergeortho   MYOMECTOMY     OOPHORECTOMY     trigger finger Right 12/15/2014   Vulva cyst  11/2006    Family History  Problem Relation Age of Onset   Dementia Mother    Diabetes Father    Stroke  Father    Cancer Daughter        Fibroid Tumors   Diabetes Brother    Breast cancer Neg Hx     Social History   Tobacco Use   Smoking status: Never   Smokeless tobacco: Never   Tobacco comments:    smoking cessation materials not required  Substance Use Topics   Alcohol use: No    Alcohol/week: 0.0 standard drinks of alcohol     Current Outpatient Medications:    acetaminophen (TYLENOL) 500 MG tablet, Take by mouth. Reported on 11/15/2015, Disp: , Rfl:    aspirin EC 81 MG tablet, Take 81 mg by mouth daily., Disp: , Rfl:    atorvastatin (LIPITOR) 40 MG tablet, TAKE 1 TABLET (40 MG TOTAL) BY MOUTH EVERY EVENING. FOR CHOLESTEROL, Disp: 90 tablet, Rfl: 1   Azelastine HCl 137 MCG/SPRAY SOLN, PLACE 2 SPRAYS INTO BOTH NOSTRILS 2 (TWO) TIMES DAILY. USE IN EACH NOSTRIL AS DIRECTED, Disp: 90 mL, Rfl: 0   buPROPion (WELLBUTRIN XL) 150 MG 24 hr tablet, Take 1 tablet (150 mg total) by mouth daily., Disp: 90 tablet, Rfl: 1   Cholecalciferol (VITAMIN D PO), Take 1 capsule by mouth daily. 3000 IU daily, Disp: , Rfl:    citalopram (CELEXA) 20 MG tablet, Take 1 tablet (20 mg total) by mouth daily., Disp: 90 tablet, Rfl: 1   Cyanocobalamin (B-12) 1000 MCG SUBL, Place 1 each under the tongue 2 (two) times a week. , Disp: , Rfl:    glucose blood (ONETOUCH VERIO) test strip, Use as instructed, Disp: 100 each, Rfl: 12   loratadine (CLARITIN) 10 MG tablet, Take 1 tablet (10 mg total) by mouth daily., Disp: 90 tablet, Rfl: 1   metFORMIN (GLUCOPHAGE) 500 MG tablet, Take 1 tablet (500 mg total) by mouth 2 (two) times daily., Disp: 180 tablet, Rfl: 1   nadolol (CORGARD) 40 MG tablet, TAKE 1 TABLET BY MOUTH EVERY DAY, Disp: 90 tablet, Rfl: 1   Olmesartan-amLODIPine-HCTZ 40-5-12.5 MG TABS, TAKE 1 TABLET BY MOUTH EVERY DAY, Disp: 90 tablet, Rfl: 1   triamcinolone cream (KENALOG) 0.1 %, Reported on 07/15/2015, Disp: , Rfl:   Allergies  Allergen Reactions   Duloxetine     sweat    I personally reviewed active  problem list, medication list, allergies, family history, social history, health maintenance with the patient/caregiver today.   ROS  Constitutional: Negative for fever or weight change.  Respiratory: Negative for cough and shortness of breath.   Cardiovascular: Negative for chest pain or palpitations.  Gastrointestinal: Negative for abdominal pain, no bowel changes.  Musculoskeletal: Negative for gait problem or joint swelling.  Skin: Negative for rash.  Neurological: Negative for dizziness or headache.  No other specific complaints in a complete review of systems (except as listed in HPI above).   Objective  Vitals:   05/23/22 0836  BP: 124/80  Pulse: 70  Resp: 16  Temp: 97.7 F (36.5 C)  TempSrc: Oral  SpO2: 98%  Weight: 202 lb 1.6 oz (91.7 kg)  Height: 5\' 2"  (1.575 m)    Body mass index is 36.96 kg/m.  Physical Exam  Constitutional: Patient appears well-developed and well-nourished. Obese  No distress.  HEENT: head atraumatic, normocephalic, pupils equal and reactive to light, neck supple Cardiovascular: Normal rate, regular rhythm and normal heart sounds.  No murmur heard. No BLE edema. Pulmonary/Chest: Effort normal and breath sounds normal. No respiratory distress. Abdominal: Soft.  There is no tenderness. Muscular skeletal; mild effusion of left knee, normal rom.  Psychiatric: Patient has a normal mood and affect. behavior is normal. Judgment and thought content normal.    PHQ2/9:    05/23/2022    8:43 AM 02/01/2022   11:16 AM 12/08/2021   10:29 AM 11/08/2021    9:13 AM 08/17/2021    3:10 PM  Depression screen PHQ 2/9  Decreased Interest 0 1 0 0 0  Down, Depressed, Hopeless 0 1 0 0 1  PHQ - 2 Score 0 2 0 0 1  Altered sleeping 0 0  0 0  Tired, decreased energy 1 2  0 0  Change in appetite 0 0  0 0  Feeling bad or failure about yourself  0 0  0 0  Trouble concentrating 0 0  0 0  Moving slowly or fidgety/restless 0 0  0 0  Suicidal thoughts 0 0  0 0   PHQ-9 Score 1 4  0 1  Difficult doing work/chores Not difficult at all        phq 9 is negative   Fall Risk:    05/23/2022    8:43 AM 02/01/2022   11:16 AM 12/08/2021   10:29 AM 11/08/2021    9:12 AM 08/17/2021    3:10 PM  Fall Risk   Falls in the past year? 1 0 0 0 0  Number falls in past yr: 0 0 0 0 0  Injury with Fall? 0 0 0 0 0  Risk for fall due to : History of fall(s) No Fall Risks  No Fall Risks No Fall Risks  Follow up Falls prevention discussed;Education provided;Falls evaluation completed Falls prevention discussed Falls evaluation completed Falls prevention discussed Falls prevention discussed      Functional Status Survey: Is the patient deaf or have difficulty hearing?: No Does the patient have difficulty seeing, even when wearing glasses/contacts?: No Does the patient have difficulty concentrating, remembering, or making decisions?: No Does the patient have difficulty walking or climbing stairs?: No Does the patient have difficulty dressing or bathing?: No Does the patient have difficulty doing errands alone such as visiting a doctor's office or shopping?: No    Assessment & Plan  1. Morbid obesity (HCC)  Discussed with the patient the risk posed by an increased BMI. Discussed importance of portion control, calorie counting and at least 150 minutes of physical activity weekly. Avoid sweet beverages and drink more water. Eat at least 6 servings of fruit and vegetables daily    2. Stage 3a chronic kidney disease (HCC)  We will recheck labs   3. Major Depression in Remission  [facility])  Doing better, accepting that her husband loves his job and is always busy, she has been going to the gym and is doing senior line dance twice a week   4. Vitamin D deficiency  Continue supplementation  5. GAD (generalized anxiety disorder)   6. Hyperglycemia  Last A1C went up   7. Benign essential HTN  At goal   8. Osteopenia after menopause

## 2022-05-23 ENCOUNTER — Encounter: Payer: Self-pay | Admitting: Family Medicine

## 2022-05-23 ENCOUNTER — Ambulatory Visit: Payer: Medicare PPO | Admitting: Family Medicine

## 2022-05-23 DIAGNOSIS — F411 Generalized anxiety disorder: Secondary | ICD-10-CM

## 2022-05-23 DIAGNOSIS — E559 Vitamin D deficiency, unspecified: Secondary | ICD-10-CM

## 2022-05-23 DIAGNOSIS — M8580 Other specified disorders of bone density and structure, unspecified site: Secondary | ICD-10-CM

## 2022-05-23 DIAGNOSIS — F325 Major depressive disorder, single episode, in full remission: Secondary | ICD-10-CM | POA: Diagnosis not present

## 2022-05-23 DIAGNOSIS — R739 Hyperglycemia, unspecified: Secondary | ICD-10-CM | POA: Diagnosis not present

## 2022-05-23 DIAGNOSIS — Z780 Asymptomatic menopausal state: Secondary | ICD-10-CM | POA: Diagnosis not present

## 2022-05-23 DIAGNOSIS — I10 Essential (primary) hypertension: Secondary | ICD-10-CM

## 2022-05-23 DIAGNOSIS — N1831 Chronic kidney disease, stage 3a: Secondary | ICD-10-CM

## 2022-05-23 MED ORDER — VITAMIN D 50 MCG (2000 UT) PO CAPS: 1.0000 | ORAL_CAPSULE | Freq: Every day | ORAL | 0 refills | Status: AC

## 2022-05-24 LAB — BASIC METABOLIC PANEL WITH GFR
BUN/Creatinine Ratio: 16 (calc) (ref 6–22)
BUN: 17 mg/dL (ref 7–25)
CO2: 31 mmol/L (ref 20–32)
Calcium: 9.7 mg/dL (ref 8.6–10.4)
Chloride: 104 mmol/L (ref 98–110)
Creat: 1.08 mg/dL — ABNORMAL HIGH (ref 0.60–1.00)
Glucose, Bld: 96 mg/dL (ref 65–99)
Potassium: 5.2 mmol/L (ref 3.5–5.3)
Sodium: 143 mmol/L (ref 135–146)
eGFR: 55 mL/min/{1.73_m2} — ABNORMAL LOW (ref >=60)

## 2022-05-25 ENCOUNTER — Encounter: Payer: Self-pay | Admitting: Family Medicine

## 2022-06-02 ENCOUNTER — Other Ambulatory Visit: Payer: Self-pay | Admitting: Family Medicine

## 2022-06-02 DIAGNOSIS — I10 Essential (primary) hypertension: Secondary | ICD-10-CM

## 2022-08-30 ENCOUNTER — Other Ambulatory Visit: Payer: Self-pay | Admitting: Family Medicine

## 2022-08-30 DIAGNOSIS — F411 Generalized anxiety disorder: Secondary | ICD-10-CM

## 2022-08-30 DIAGNOSIS — F330 Major depressive disorder, recurrent, mild: Secondary | ICD-10-CM

## 2022-09-13 NOTE — Progress Notes (Unsigned)
Name: Monique Baker   MRN: [id number]    DOB: April 29, 1951   Date:09/14/2022       Progress Note  Subjective  Chief Complaint  Follow Up  HPI  HTN: doing well on current regiment, bp has been well controlled, no recent episodes of chest pain or palpitation . Continue current medication   Depression Major in remission : she is married to a Education officer, environmental, he also works as a Airline pilot at [employer]. We started her on Cymbalta 10/2015 was doing better but caused hair loss, currently on Citalopram band tried to wean self off last year but symptoms returned, she is back on Citalopram daily . She is taking care of grandkids after school and also going to the gym. She is doing well   Pre diabetes/metabolic syndrome: she is taking Metformin, denies side effects, hgbA1C went up to 5.8 % she is following Weight Watchers diet    Right hip pain/OA: she had right hip replacement back in Nov 2019 by Dr. Odis Luster She is now pain free, no longer using a cane , not taking any medications for it at this time , reminded her to avoid NSAID's. She can take Tylenol prn   Chronic kidney disease stage III a: we will recheck labs today, advised to avoid NSAID's. BP is at goal.   Recent Fall: she was changing the linens on her bed yesterday, her right foot got caught on the linens on the floor and she fell on left side and hit the knee on the ground and left hip. She has hard wood floors. She is walking without pain but has mild effusion of left knee No redness. Advised ice, elevation and tylenol    Morbid obesity: BMI over 35 with co-morbidities such as HTN, CKI and prediabetes ,  she joined weight watchers 05/2016 at a weight of 224 lbs  She stopped going for a while but has been back to [employer] at the end of 2022 and weight was down to 202 lbs January 2024. She is also going to the gym a couple of a times a week and spends about 2 hours between line dance classes and machines. She is down today to 199 lbs  today. She is getting frustrated , her goal is to go down to a size 16 so she can wear the dresses that are in her closet. She asked me about rx medication. 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   Benign essential HTN 11/05/2014   Carpal tunnel syndrome 11/05/2014   Osteoarthritis 11/05/2014   Dermatitis, eczematoid 11/05/2014   Depression, major, recurrent, mild (HCC) 11/05/2014   Dyslipidemia 11/05/2014   Gastro-esophageal reflux disease without esophagitis 11/05/2014   Morbid obesity (HCC) 11/05/2014   Dysmetabolic syndrome 11/05/2014   Menopausal and perimenopausal disorder 11/05/2014   Perennial allergic rhinitis with seasonal variation 11/05/2014   Seborrhea capitis 11/05/2014   Acquired trigger finger 11/05/2014   Urinary incontinence in female 11/05/2014   Vitamin D deficiency 11/05/2014   Blood glucose elevated 08/31/2009    Past Surgical History:  Procedure Laterality Date   ABDOMINAL HYSTERECTOMY     CATARACT EXTRACTION, BILATERAL  CESAREAN SECTION     COLONOSCOPY WITH PROPOFOL N/A 03/15/2016   Procedure: COLONOSCOPY WITH PROPOFOL;  Surgeon: Kieth Brightly, MD;  Location: [facility];  Service: Endoscopy;  Laterality: N/A;   HALLUX VALGUS LAPIDUS Left 06/16/2015   Procedure: HALLUX VALGUS LAPIDUS;  Surgeon: Gwyneth Revels, DPM;  Location: [facility];  Service: Podiatry;  Laterality: Left;  WITH POPLITEAL   HAMMER TOE SURGERY Left 06/16/2015   Procedure: HAMMER TOE CORRECTION SECOND TOE LEFT;  Surgeon: Gwyneth Revels, DPM;  Location: [facility];  Service: Podiatry;  Laterality: Left;  prediabetic - on oral meds   hip replacemanet  Right 03/15/2018   Fayrene Fearing [facility]merge[facility]o   MYOMECTOMY     OOPHORECTOMY     trigger finger Right 12/15/2014   Vulva cyst  11/2006    Family History  Problem Relation Age of Onset   Dementia Mother    Diabetes Father    Stroke Father    Cancer Daughter        Fibroid Tumors   Diabetes Brother    Breast cancer Neg Hx     Social History   Tobacco Use   Smoking status: Never   Smokeless tobacco: Never   Tobacco comments:    smoking cessation materials not required  Substance Use Topics   Alcohol use: No    Alcohol/week: 0.0 standard drinks of alcohol     Current Outpatient Medications:    acetaminophen (TYLENOL) 500 MG tablet, Take by mouth. Reported on 11/15/2015, Disp: , Rfl:    aspirin EC 81 MG tablet, Take 81 mg by mouth daily., Disp: , Rfl:    atorvastatin (LIPITOR) 40 MG tablet, TAKE 1 TABLET (40 MG TOTAL) BY MOUTH EVERY EVENING. FOR CHOLESTEROL, Disp: 90 tablet, Rfl: 1   Azelastine HCl 137 MCG/SPRAY SOLN, PLACE 2 SPRAYS INTO BOTH NOSTRILS 2 (TWO) TIMES DAILY. USE IN EACH NOSTRIL AS DIRECTED, Disp: 90 mL, Rfl: 0   buPROPion (WELLBUTRIN XL) 150 MG 24 hr tablet, Take 1 tablet (150 mg total) by mouth daily., Disp: 90 tablet, Rfl: 1   Cholecalciferol (VITAMIN D) 50 MCG (2000 UT) CAPS, Take 1 capsule (2,000 Units total) by mouth daily., Disp: 30 capsule, Rfl: 0   citalopram (CELEXA) 20 MG tablet, TAKE 1 TABLET BY MOUTH EVERY DAY, Disp: 90 tablet, Rfl: 0   Cyanocobalamin (B-12) 1000 MCG SUBL, Place 1 each under the tongue 2 (two) times a week. , Disp: , Rfl:    glucose blood (ONETOUCH VERIO) test strip, Use as instructed, Disp: 100 each, Rfl: 12   loratadine (CLARITIN) 10 MG tablet, Take 1 tablet (10 mg total) by mouth daily., Disp: 90 tablet, Rfl: 1   metFORMIN (GLUCOPHAGE) 500 MG tablet, Take 1 tablet (500 mg total) by mouth 2 (two) times daily., Disp: 180 tablet, Rfl: 1   nadolol (CORGARD) 40 MG tablet, TAKE 1 TABLET BY MOUTH EVERY DAY, Disp: 90 tablet, Rfl: 1    Olmesartan-amLODIPine-HCTZ 40-5-12.5 MG TABS, TAKE 1 TABLET BY MOUTH EVERY DAY, Disp: 90 tablet, Rfl: 1   triamcinolone cream (KENALOG) 0.1 %, Reported on 07/15/2015, Disp: , Rfl:   Allergies  Allergen Reactions   Duloxetine     sweat    I personally reviewed active problem list, medication list, allergies, family history, social history, health maintenance with the patient/caregiver today.   ROS  Constitutional: Negative for fever or weight change.  Respiratory: Negative for cough and shortness of breath.   Cardiovascular: Negative for chest pain or palpitations.  Gastrointestinal: Negative  for abdominal pain, no bowel changes.  Musculoskeletal: Negative for gait problem or joint swelling.  Skin: Negative for rash.  Neurological: Negative for dizziness or headache.  No other specific complaints in a complete review of systems (except as listed in HPI above).   Objective  Vitals:   09/14/22 0851  BP: 128/74  Pulse: 76  Resp: 16  SpO2: 96%  Weight: 199 lb (90.3 kg)  Height: 5\' 2"  (1.575 m)    Body mass index is 36.4 kg/m.  Physical Exam  Constitutional: Patient appears well-developed and well-nourished. Obese  No distress.  HEENT: head atraumatic, normocephalic, pupils equal and reactive to light, neck supple Cardiovascular: Normal rate, regular rhythm and normal heart sounds.  No murmur heard. No BLE edema. Pulmonary/Chest: Effort normal and breath sounds normal. No respiratory distress. Abdominal: Soft.  There is no tenderness. Psychiatric: Patient has a normal mood and affect. behavior is normal. Judgment and thought content normal.   PHQ2/9:    09/14/2022    8:50 AM 05/23/2022    8:43 AM 02/01/2022   11:16 AM 12/08/2021   10:29 AM 11/08/2021    9:13 AM  Depression screen PHQ 2/9  Decreased Interest 0 0 1 0 0  Down, Depressed, Hopeless 0 0 1 0 0  PHQ - 2 Score 0 0 2 0 0  Altered sleeping 0 0 0  0  Tired, decreased energy 0 1 2  0  Change in appetite 0 0 0  0   Feeling bad or failure about yourself  0 0 0  0  Trouble concentrating 0 0 0  0  Moving slowly or fidgety/restless 0 0 0  0  Suicidal thoughts 0 0 0  0  PHQ-9 Score 0 1 4  0  Difficult doing work/chores  Not difficult at all       phq 9 is negative   Fall Risk:    09/14/2022    8:50 AM 05/23/2022    8:43 AM 02/01/2022   11:16 AM 12/08/2021   10:29 AM 11/08/2021    9:12 AM  Fall Risk   Falls in the past year? 1 1 0 0 0  Number falls in past yr: 1 0 0 0 0  Injury with Fall? 0 0 0 0 0  Risk for fall due to : No Fall Risks History of fall(s) No Fall Risks  No Fall Risks  Follow up Falls prevention discussed Falls prevention discussed;Education provided;Falls evaluation completed Falls prevention discussed Falls evaluation completed Falls prevention discussed    Functional Status Survey: Is the patient deaf or have difficulty hearing?: No Does the patient have difficulty seeing, even when wearing glasses/contacts?: No Does the patient have difficulty concentrating, remembering, or making decisions?: No Does the patient have difficulty walking or climbing stairs?: No Does the patient have difficulty dressing or bathing?: No Does the patient have difficulty doing errands alone such as visiting a doctor's office or shopping?: No    Assessment & Plan  1. Stage 3a chronic kidney disease (HCC)  Discussed avoiding nsaid's we will continue to monitor   2. Major depression in remission (HCC)  - citalopram (CELEXA) 20 MG tablet; Take 1 tablet (20 mg total) by mouth daily.  Dispense: 90 tablet; Refill: 0  3. Morbid obesity (HCC)  Discussed with the patient the risk posed by an increased BMI. Discussed importance of portion control, calorie counting and at least 150 minutes of physical activity weekly. Avoid sweet beverages and drink more water. Eat at  least 6 servings of fruit and vegetables daily    4. GAD (generalized anxiety disorder)  - citalopram (CELEXA) 20 MG tablet; Take 1  tablet (20 mg total) by mouth daily.  Dispense: 90 tablet; Refill: 0  5. Benign essential HTN  - nadolol (CORGARD) 40 MG tablet; Take 1 tablet (40 mg total) by mouth daily.  Dispense: 90 tablet; Refill: 1 - Olmesartan-amLODIPine-HCTZ 40-5-12.5 MG TABS; Take 1 tablet by mouth daily.  Dispense: 90 tablet; Refill: 1  6. B12 deficiency  Continue supplementation  7. Dyslipidemia  - atorvastatin (LIPITOR) 40 MG tablet; Take 1 tablet (40 mg total) by mouth every evening. for cholesterol  Dispense: 90 tablet; Refill: 1  8. Hyperglycemia  - metFORMIN (GLUCOPHAGE) 500 MG tablet; Take 1 tablet (500 mg total) by mouth 2 (two) times daily.  Dispense: 180 tablet; Refill: 1

## 2022-09-14 ENCOUNTER — Ambulatory Visit: Payer: Medicare PPO | Admitting: Family Medicine

## 2022-09-14 ENCOUNTER — Encounter: Payer: Self-pay | Admitting: Family Medicine

## 2022-09-14 VITALS — BP 128/74 | HR 76 | Resp 16 | Ht 62.0 in | Wt 199.0 lb

## 2022-09-14 DIAGNOSIS — F325 Major depressive disorder, single episode, in full remission: Secondary | ICD-10-CM

## 2022-09-14 DIAGNOSIS — I10 Essential (primary) hypertension: Secondary | ICD-10-CM

## 2022-09-14 DIAGNOSIS — E538 Deficiency of other specified B group vitamins: Secondary | ICD-10-CM | POA: Diagnosis not present

## 2022-09-14 DIAGNOSIS — F411 Generalized anxiety disorder: Secondary | ICD-10-CM

## 2022-09-14 DIAGNOSIS — N1831 Chronic kidney disease, stage 3a: Secondary | ICD-10-CM | POA: Diagnosis not present

## 2022-09-14 DIAGNOSIS — R739 Hyperglycemia, unspecified: Secondary | ICD-10-CM | POA: Diagnosis not present

## 2022-09-14 DIAGNOSIS — E785 Hyperlipidemia, unspecified: Secondary | ICD-10-CM

## 2022-09-14 MED ORDER — OLMESARTAN-AMLODIPINE-HCTZ 40-5-12.5 MG PO TABS: 1.0000 | ORAL_TABLET | Freq: Every day | ORAL | 1 refills | Status: DC

## 2022-09-14 MED ORDER — NADOLOL 40 MG PO TABS: 40.0000 mg | ORAL_TABLET | Freq: Every day | ORAL | 1 refills | Status: DC

## 2022-09-14 MED ORDER — ATORVASTATIN CALCIUM 40 MG PO TABS: 40.0000 mg | ORAL_TABLET | Freq: Every evening | ORAL | 1 refills | Status: DC

## 2022-09-14 MED ORDER — METFORMIN HCL 500 MG PO TABS: 500.0000 mg | ORAL_TABLET | Freq: Two times a day (BID) | ORAL | 1 refills | Status: DC

## 2022-09-14 MED ORDER — CITALOPRAM HYDROBROMIDE 20 MG PO TABS: 20.0000 mg | ORAL_TABLET | Freq: Every day | ORAL | 0 refills | Status: DC

## 2022-10-13 ENCOUNTER — Other Ambulatory Visit: Payer: Self-pay | Admitting: Family Medicine

## 2022-10-13 DIAGNOSIS — Z1231 Encounter for screening mammogram for malignant neoplasm of breast: Secondary | ICD-10-CM

## 2022-10-24 ENCOUNTER — Ambulatory Visit
Admission: RE | Admit: 2022-10-24 | Discharge: 2022-10-24 | Disposition: A | Discharge status: Home / Self Care | Payer: Medicare PPO | Source: Ambulatory Visit | Attending: Family Medicine | Admitting: Family Medicine

## 2022-10-24 DIAGNOSIS — Z1231 Encounter for screening mammogram for malignant neoplasm of breast: Secondary | ICD-10-CM | POA: Diagnosis not present

## 2022-11-13 NOTE — Progress Notes (Deleted)
Patient: Monique Baker, Female    DOB: 02-Nov-1950, 72 y.o.   MRN: [id number]  Visit Date: 11/13/2022  Today's Provider: Ruel Favors, MD   AWV  Subjective:    HPI Monique Baker is a 72 y.o. female who presents today for her Subsequent Annual Wellness Visit.  Patient/Caregiver input:    Review of Systems *** Constitutional: Negative for fever or weight change.  Respiratory: Negative for cough and shortness of breath.   Cardiovascular: Negative for chest pain or palpitations.  Gastrointestinal: Negative for abdominal pain, no bowel changes.  Musculoskeletal: Negative for gait problem or joint swelling.  Skin: Negative for rash.  Neurological: Negative for dizziness or headache.  No other specific complaints in a complete review of systems (except as listed in HPI above).  Past Medical History:  Diagnosis Date   Allergy    Depression    GERD (gastroesophageal reflux disease)    Hyperlipidemia    Hypertension    Internal hemorrhoids    Menopausal disorder    Osteoarthritis    hands   Prediabetes    Wears dentures    partial upper    Past Surgical History:  Procedure Laterality Date   ABDOMINAL HYSTERECTOMY     CATARACT EXTRACTION, BILATERAL     CESAREAN SECTION     COLONOSCOPY WITH PROPOFOL N/A 03/15/2016   Procedure: COLONOSCOPY WITH PROPOFOL;  Surgeon: Kieth Brightly, MD;  Location: [facility];  Service: Endoscopy;  Laterality: N/A;   HALLUX VALGUS LAPIDUS Left 06/16/2015   Procedure: HALLUX VALGUS LAPIDUS;  Surgeon: Gwyneth Revels, DPM;  Location: [facility];  Service: Podiatry;  Laterality: Left;  WITH POPLITEAL   HAMMER TOE SURGERY Left 06/16/2015   Procedure: HAMMER TOE CORRECTION SECOND TOE LEFT;  Surgeon: Gwyneth Revels, DPM;  Location: [facility];  Service: Podiatry;  Laterality: Left;  prediabetic - on oral meds   hip replacemanet Right 03/15/2018   Fayrene Fearing Bowers-Emergeortho   MYOMECTOMY     OOPHORECTOMY     trigger  finger Right 12/15/2014   Vulva cyst  11/2006    Family History  Problem Relation Age of Onset   Dementia Mother    Diabetes Father    Stroke Father    Cancer Daughter        Fibroid Tumors   Diabetes Brother    Breast cancer Neg Hx     Social History   Socioeconomic History   Marital status: Married    Spouse name: Adela Glimpse   Number of children: 1   Years of education: Not on file   Highest education level: Bachelor's degree (e.g., BA, AB, BS)  Occupational History   Occupation: retired Runner, broadcasting/film/video    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  Tobacco Use   Smoking status: Never   Smokeless tobacco: Never   Tobacco comments:    smoking cessation materials not required  Vaping Use   Vaping status: Never Used  Substance and Sexual Activity   Alcohol use: No    Alcohol/week: 0.0 standard drinks of alcohol   Drug use: No   Sexual activity: Not Currently    Partners: Male  Other Topics Concern   Not on file  Social History Narrative   Married to a Education officer, environmental and he is a professor at [employer] Armed forces training and education officer )    Social Determinants of Health   Financial Resource Strain: Low Risk  (06/23/2021)   Overall Financial Resource Strain (CARDIA)    Difficulty of Paying Living Expenses: Not hard at all  Food Insecurity: No Food Insecurity (06/23/2021)   Hunger Vital Sign    Worried About Running Out of Food in the Last Year: Never true    Ran Out of Food in the Last Year: Never true  Transportation Needs: No Transportation Needs (06/23/2021)   PRAPARE - Administrator, Civil Service (Medical): No    Lack of Transportation (Non-Medical): No  Physical Activity: Sufficiently Active (06/23/2021)   Exercise Vital Sign    Days of Exercise per Week: 4 days    Minutes of Exercise per Session: 80 min  Stress: No Stress Concern Present (06/23/2021)   [employer] of Occupational Health - Occupational Stress Questionnaire    Feeling of Stress : Only a little  Social Connections: Socially Integrated  (06/23/2021)   Social Connection and Isolation Panel [NHANES]    Frequency of Communication with Friends and Family: More than three times a week    Frequency of Social Gatherings with Friends and Family: Twice a week    Attends Religious Services: More than 4 times per year    Active Member of [employer] or Organizations: Yes    Attends Engineer, structural: More than 4 times per year    Marital Status: Married  Catering manager Violence: Not At Risk (06/23/2021)   Humiliation, Afraid, Rape, and Kick questionnaire    Fear of Current or Ex-Partner: No    Emotionally Abused: No    Physically Abused: No    Sexually Abused: No    Outpatient Encounter Medications as of 11/14/2022  Medication Sig   acetaminophen (TYLENOL) 500 MG tablet Take by mouth. Reported on 11/15/2015   aspirin EC 81 MG tablet Take 81 mg by mouth daily.   atorvastatin (LIPITOR) 40 MG tablet Take 1 tablet (40 mg total) by mouth every evening. for cholesterol   Azelastine HCl 137 MCG/SPRAY SOLN PLACE 2 SPRAYS INTO BOTH NOSTRILS 2 (TWO) TIMES DAILY. USE IN EACH NOSTRIL AS DIRECTED   buPROPion (WELLBUTRIN XL) 150 MG 24 hr tablet Take 1 tablet (150 mg total) by mouth daily.   Cholecalciferol (VITAMIN D) 50 MCG (2000 UT) CAPS Take 1 capsule (2,000 Units total) by mouth daily.   citalopram (CELEXA) 20 MG tablet Take 1 tablet (20 mg total) by mouth daily.   Cyanocobalamin (B-12) 1000 MCG SUBL Place 1 each under the tongue 2 (two) times a week.    glucose blood (ONETOUCH VERIO) test strip Use as instructed   loratadine (CLARITIN) 10 MG tablet Take 1 tablet (10 mg total) by mouth daily.   metFORMIN (GLUCOPHAGE) 500 MG tablet Take 1 tablet (500 mg total) by mouth 2 (two) times daily.   nadolol (CORGARD) 40 MG tablet Take 1 tablet (40 mg total) by mouth daily.   Olmesartan-amLODIPine-HCTZ 40-5-12.5 MG TABS Take 1 tablet by mouth daily.   triamcinolone cream (KENALOG) 0.1 % Reported on 07/15/2015   No facility-administered  encounter medications on file as of 11/14/2022.    Allergies  Allergen Reactions   Duloxetine     sweat    Care Team Updated in EHR: {Yes/No:30480221}  Last Vision Exam: *** Wears corrective lenses: {Yes/No:30480221} Last Dental Exam: *** Last Hearing Exam: *** Wears Hearing Aids: {Yes/No:30480221}  Functional Ability / Safety Screening 1.  Was the timed Get Up and Go test shorter than 30 seconds?  {Blank single:19197::"yes","no"} 2.  Does the patient need help with the phone, transportation, shopping,      preparing meals, housework, laundry, medications, or managing money?  {Blank  single:19197::"yes","no"} 3.  Is the patient's home free of loose throw rugs in walkways, pet beds, electrical cords, etc?   {Blank single:19197::"yes","no"}      Grab bars in the bathroom? {Blank single:19197::"yes","no"}      Handrails on the stairs?   {Blank single:19197::"yes","no"}      Adequate lighting?   {Blank single:19197::"yes","no"} 4.  Has the patient noticed any hearing difficulties?   {Blank single:19197::"yes","no"}  Diet Recall and Exercise Regimen: ***  Advanced Care Planning: A voluntary discussion about advance care planning including the explanation and discussion of advance directives.  Discussed health care proxy and Living will, and the patient was able to identify a health care proxy as ***.  Patient {DOES_DOES WUJ:81191} have a living will at present time. If patient does have living will, I have requested they bring this to the clinic to be scanned in to their chart. Does patient have a HCPOA?    {Blank single:19197::"yes","no"} If yes, name and contact information:  Does patient have a living will or MOST form?  {Blank single:19197::"yes","no"}  Cancer Screenings: Skin: *** Lung: Low Dose CT Chest recommended if Age 49-80 years, 30 pack-year currently smoking OR have quit w/in 15years. Patient does not qualify. Breast: Up to date on Mammogram? Yes  Up to date of Bone  Density/Dexa? Yes Colon: Colonoscopy 03/15/16  Additional Screenings: Hepatitis B/HIV/Syphillis: N/A Hepatitis C Screening: 12/28/11 Intimate Partner Violence: Negative  Objective:   Vitals: There were no vitals taken for this visit. There is no height or weight on file to calculate BMI.  No results found.  Physical Exam Constitutional: Patient appears well-developed and well-nourished. Obese *** No distress.  HEENT: head atraumatic, normocephalic, pupils equal and reactive to light, ears ***, neck supple, throat within normal limits Cardiovascular: Normal rate, regular rhythm and normal heart sounds.  No murmur heard. No BLE edema. Pulmonary/Chest: Effort normal and breath sounds normal. No respiratory distress. Abdominal: Soft.  There is no tenderness. Psychiatric: Patient has a normal mood and affect. behavior is normal. Judgment and thought content normal.  Cognitive Testing - 6-CIT  Correct? Score   What year is it? {YES NO:22349} {Numbers; 0-4:31231} Yes = 0    No = 4  What month is it? {YES NO:22349} {Numbers; 0-4:31231} Yes = 0    No = 3  Remember:     Floyde Parkins, [address], [place]     What time is it? {YES NO:22349} {Numbers; 0-4:31231} Yes = 0    No = 3  Count backwards from 20 to 1 {YES NO:22349} {Numbers; 0-4:31231} Correct = 0    1 error = 2   More than 1 error = 4  Say the months of the year in reverse. {YES NO:22349} {Numbers; 0-4:31231} Correct = 0    1 error = 2   More than 1 error = 4  What address did I ask you to remember? {YES NO:22349} {NUMBERS; 0-10:5044} Correct = 0  1 error = 2    2 error = 4    3 error = 6    4 error = 8    All wrong = 10       TOTAL SCORE  {Numbers; 4-78:29562}/13   Interpretation:  {Desc; normal/abnormal:11317::"Normal"}  Normal (0-7) Abnormal (8-28)   Fall Risk:    09/14/2022    8:50 AM 05/23/2022    8:43 AM 02/01/2022   11:16 AM 12/08/2021   10:29 AM 11/08/2021    9:12 AM  Fall Risk  Falls in the past year? 1 1 0 0 0   Number falls in past yr: 1 0 0 0 0  Injury with Fall? 0 0 0 0 0  Risk for fall due to : No Fall Risks History of fall(s) No Fall Risks  No Fall Risks  Follow up Falls prevention discussed Falls prevention discussed;Education provided;Falls evaluation completed Falls prevention discussed Falls evaluation completed Falls prevention discussed    Depression Screen    09/14/2022    8:50 AM 05/23/2022    8:43 AM 02/01/2022   11:16 AM 12/08/2021   10:29 AM 11/08/2021    9:13 AM  Depression screen PHQ 2/9  Decreased Interest 0 0 1 0 0  Down, Depressed, Hopeless 0 0 1 0 0  PHQ - 2 Score 0 0 2 0 0  Altered sleeping 0 0 0  0  Tired, decreased energy 0 1 2  0  Change in appetite 0 0 0  0  Feeling bad or failure about yourself  0 0 0  0  Trouble concentrating 0 0 0  0  Moving slowly or fidgety/restless 0 0 0  0  Suicidal thoughts 0 0 0  0  PHQ-9 Score 0 1 4  0  Difficult doing work/chores  Not difficult at all       No results found for this or any previous visit (from the past 2160 hour(s)).  Assessment & Plan:    1. Encounter for Medicare annual wellness exam   *** type dotphrase "dot"diagmed to refresh this list  Exercise Activities and Dietary recommendations  Goals      DIET     Recommend to decrease portion sizes by eating 3 small healthy meals and at least 2 healthy snacks per day.      DIET - INCREASE WATER INTAKE     Recommend to drink at least 6-8 8oz glasses of water per day.      Weight (lb) < 170 lb (77.1 kg)     Pt would like to lose weight over the next year with weight watchers and physical activity.       - Discussed health benefits of physical activity, and encouraged her to engage in regular exercise appropriate for her age and condition.   Immunization History  Administered Date(s) Administered   COVID-19, mRNA, vaccine(Comirnaty)12 years and older 04/11/2022   Fluad Quad(high Dose 65+) 02/19/2019, 01/26/2021, 02/01/2022   Influenza, High Dose Seasonal  PF 01/18/2017, 01/14/2018   Influenza,inj,Quad PF,6+ Mos 01/11/2015, 01/17/2016, 03/15/2020   Influenza-Unspecified 01/06/2014   PFIZER Comirnaty(Gray Top)Covid-19 Tri-Sucrose Vaccine 08/27/2020   PFIZER(Purple Top)SARS-COV-2 Vaccination 06/23/2019, 07/14/2019, 02/03/2020, 03/07/2021   PNEUMOCOCCAL CONJUGATE-20 02/01/2022   Pneumococcal Conjugate-13 07/23/2017   Pneumococcal Polysaccharide-23 08/31/2009   Tdap 05/10/2010, 07/23/2017   Zoster Recombinant(Shingrix) 06/22/2021, 10/18/2021   Zoster, Live 12/28/2011    Health Maintenance  Topic Date Due   COVID-19 Vaccine (7 - 2023-24 season) 06/06/2022   INFLUENZA VACCINE  11/30/2022   MAMMOGRAM  10/24/2023   Medicare Annual Wellness (AWV)  11/14/2023   Colonoscopy  03/15/2026   DTaP/Tdap/Td (3 - Td or Tdap) 07/24/2027   Pneumonia Vaccine 37+ Years old  Completed   DEXA SCAN  Completed   Hepatitis C Screening  Completed   Zoster Vaccines- Shingrix  Completed   HPV VACCINES  Aged Out    No orders of the defined types were placed in this encounter.   Current Outpatient Medications:    acetaminophen (TYLENOL) 500 MG tablet, Take by  mouth. Reported on 11/15/2015, Disp: , Rfl:    aspirin EC 81 MG tablet, Take 81 mg by mouth daily., Disp: , Rfl:    atorvastatin (LIPITOR) 40 MG tablet, Take 1 tablet (40 mg total) by mouth every evening. for cholesterol, Disp: 90 tablet, Rfl: 1   Azelastine HCl 137 MCG/SPRAY SOLN, PLACE 2 SPRAYS INTO BOTH NOSTRILS 2 (TWO) TIMES DAILY. USE IN EACH NOSTRIL AS DIRECTED, Disp: 90 mL, Rfl: 0   buPROPion (WELLBUTRIN XL) 150 MG 24 hr tablet, Take 1 tablet (150 mg total) by mouth daily., Disp: 90 tablet, Rfl: 1   Cholecalciferol (VITAMIN D) 50 MCG (2000 UT) CAPS, Take 1 capsule (2,000 Units total) by mouth daily., Disp: 30 capsule, Rfl: 0   citalopram (CELEXA) 20 MG tablet, Take 1 tablet (20 mg total) by mouth daily., Disp: 90 tablet, Rfl: 0   Cyanocobalamin (B-12) 1000 MCG SUBL, Place 1 each under the tongue 2  (two) times a week. , Disp: , Rfl:    glucose blood (ONETOUCH VERIO) test strip, Use as instructed, Disp: 100 each, Rfl: 12   loratadine (CLARITIN) 10 MG tablet, Take 1 tablet (10 mg total) by mouth daily., Disp: 90 tablet, Rfl: 1   metFORMIN (GLUCOPHAGE) 500 MG tablet, Take 1 tablet (500 mg total) by mouth 2 (two) times daily., Disp: 180 tablet, Rfl: 1   nadolol (CORGARD) 40 MG tablet, Take 1 tablet (40 mg total) by mouth daily., Disp: 90 tablet, Rfl: 1   Olmesartan-amLODIPine-HCTZ 40-5-12.5 MG TABS, Take 1 tablet by mouth daily., Disp: 90 tablet, Rfl: 1   triamcinolone cream (KENALOG) 0.1 %, Reported on 07/15/2015, Disp: , Rfl:  There are no discontinued medications.  I have personally reviewed and addressed the Medicare Annual Wellness health risk assessment questionnaire and have noted the following in the patient's chart:  A.         Medical and social history & family history B.         Use of alcohol, tobacco, and illicit drugs  C.         Current medications and supplements D.         Functional and Cognitive ability and status E.         Nutritional status F.         Physical activity G.        Advance directives H.         List of other physicians I.          Hospitalizations, surgeries, and ER visits in previous 12 months J.         Vitals K.         Screenings such as hearing, vision, cognitive function, and depression L.         Referrals and appointments: ***  In addition, I have reviewed and discussed with patient certain preventive protocols, quality metrics, and best practice recommendations. A written personalized care plan for preventive services as well as general preventive health recommendations were provided to patient.   See attached scanned questionnaire for additional information.   No follow-ups on file. *** refresh to show

## 2022-11-14 ENCOUNTER — Ambulatory Visit: Payer: Medicare PPO | Admitting: Family Medicine

## 2022-11-25 ENCOUNTER — Other Ambulatory Visit: Payer: Self-pay | Admitting: Family Medicine

## 2022-11-25 DIAGNOSIS — F330 Major depressive disorder, recurrent, mild: Secondary | ICD-10-CM

## 2022-11-27 ENCOUNTER — Other Ambulatory Visit: Payer: Self-pay

## 2022-11-27 DIAGNOSIS — F330 Major depressive disorder, recurrent, mild: Secondary | ICD-10-CM

## 2022-12-08 ENCOUNTER — Ambulatory Visit (INDEPENDENT_AMBULATORY_CARE_PROVIDER_SITE_OTHER): Payer: Medicare PPO

## 2022-12-08 VITALS — Ht 62.5 in | Wt 199.0 lb

## 2022-12-08 DIAGNOSIS — Z0000 Encounter for general adult medical examination without abnormal findings: Secondary | ICD-10-CM

## 2022-12-08 NOTE — Patient Instructions (Addendum)
Monique Baker , Thank you for taking time to come for your Medicare Wellness Visit. I appreciate your ongoing commitment to your health goals. Please review the following plan we discussed and let me know if I can assist you in the future.   Referrals/Orders/Follow-Ups/Clinician Recommendations: none  This is a list of the screening recommended for you and due dates:  Health Maintenance  Topic Date Due   COVID-19 Vaccine (7 - 2023-24 season) 06/06/2022   Flu Shot  11/30/2022   Mammogram  10/24/2023   Medicare Annual Wellness Visit  12/08/2023   Colon Cancer Screening  03/15/2026   DTaP/Tdap/Td vaccine (3 - Td or Tdap) 07/24/2027   Pneumonia Vaccine  Completed   DEXA scan (bone density measurement)  Completed   Hepatitis C Screening  Completed   Zoster (Shingles) Vaccine  Completed   HPV Vaccine  Aged Out    Advanced directives: (ACP Link)Information on Advanced Care Planning can be found at [facility] Advance Health Care Directives Advance Health Care Directives ([email or web address]   Next Medicare Annual Wellness Visit scheduled for next year: Yes 12/14/2023 @ 2:45pm telephone  Preventive Care 65 Years and Older, Female Preventive care refers to lifestyle choices and visits with your health care provider that can promote health and wellness. What does preventive care include? A yearly physical exam. This is also called an annual well check. Dental exams once or twice a year. Routine eye exams. Ask your health care provider how often you should have your eyes checked. Personal lifestyle choices, including: Daily care of your teeth and gums. Regular physical activity. Eating a healthy diet. Avoiding tobacco and drug use. Limiting alcohol use. Practicing safe sex. Taking low-dose aspirin every day. Taking vitamin and mineral supplements as recommended by your health care provider. What happens during an annual well check? The services and screenings done by your health  care provider during your annual well check will depend on your age, overall health, lifestyle risk factors, and family history of disease. Counseling  Your health care provider may ask you questions about your: Alcohol use. Tobacco use. Drug use. Emotional well-being. Home and relationship well-being. Sexual activity. Eating habits. History of falls. Memory and ability to understand (cognition). Work and work Astronomer. Reproductive health. Screening  You may have the following tests or measurements: Height, weight, and BMI. Blood pressure. Lipid and cholesterol levels. These may be checked every 5 years, or more frequently if you are over 53 years old. Skin check. Lung cancer screening. You may have this screening every year starting at age 11 if you have a 30-pack-year history of smoking and currently smoke or have quit within the past 15 years. Fecal occult blood test (FOBT) of the stool. You may have this test every year starting at age 47. Flexible sigmoidoscopy or colonoscopy. You may have a sigmoidoscopy every 5 years or a colonoscopy every 10 years starting at age 32. Hepatitis C blood test. Hepatitis B blood test. Sexually transmitted disease (STD) testing. Diabetes screening. This is done by checking your blood sugar (glucose) after you have not eaten for a while (fasting). You may have this done every 1-3 years. Bone density scan. This is done to screen for osteoporosis. You may have this done starting at age 18. Mammogram. This may be done every 1-2 years. Talk to your health care provider about how often you should have regular mammograms. Talk with your health care provider about your test results, treatment options, and if necessary, the  need for more tests. Vaccines  Your health care provider may recommend certain vaccines, such as: Influenza vaccine. This is recommended every year. Tetanus, diphtheria, and acellular pertussis (Tdap, Td) vaccine. You may need a Td  booster every 10 years. Zoster vaccine. You may need this after age 44. Pneumococcal 13-valent conjugate (PCV13) vaccine. One dose is recommended after age 16. Pneumococcal polysaccharide (PPSV23) vaccine. One dose is recommended after age 53. Talk to your health care provider about which screenings and vaccines you need and how often you need them. This information is not intended to replace advice given to you by your health care provider. Make sure you discuss any questions you have with your health care provider. Document Released: 05/14/2015 Document Revised: 01/05/2016 Document Reviewed: 02/16/2015 Elsevier Interactive Patient Education  2017 [employer].  Fall Prevention in the Home Falls can cause injuries. They can happen to people of all ages. There are many things you can do to make your home safe and to help prevent falls. What can I do on the outside of my home? Regularly fix the edges of walkways and driveways and fix any cracks. Remove anything that might make you trip as you walk through a door, such as a raised step or threshold. Trim any bushes or trees on the path to your home. Use bright outdoor lighting. Clear any walking paths of anything that might make someone trip, such as rocks or tools. Regularly check to see if handrails are loose or broken. Make sure that both sides of any steps have handrails. Any raised decks and porches should have guardrails on the edges. Have any leaves, snow, or ice cleared regularly. Use sand or salt on walking paths during winter. Clean up any spills in your garage right away. This includes oil or grease spills. What can I do in the bathroom? Use night lights. Install grab bars by the toilet and in the tub and shower. Do not use towel bars as grab bars. Use non-skid mats or decals in the tub or shower. If you need to sit down in the shower, use a plastic, non-slip stool. Keep the floor dry. Clean up any water that spills on the floor  as soon as it happens. Remove soap buildup in the tub or shower regularly. Attach bath mats securely with double-sided non-slip rug tape. Do not have throw rugs and other things on the floor that can make you trip. What can I do in the bedroom? Use night lights. Make sure that you have a light by your bed that is easy to reach. Do not use any sheets or blankets that are too big for your bed. They should not hang down onto the floor. Have a firm chair that has side arms. You can use this for support while you get dressed. Do not have throw rugs and other things on the floor that can make you trip. What can I do in the kitchen? Clean up any spills right away. Avoid walking on wet floors. Keep items that you use a lot in easy-to-reach places. If you need to reach something above you, use a strong step stool that has a grab bar. Keep electrical cords out of the way. Do not use floor polish or wax that makes floors slippery. If you must use wax, use non-skid floor wax. Do not have throw rugs and other things on the floor that can make you trip. What can I do with my stairs? Do not leave any items on the  stairs. Make sure that there are handrails on both sides of the stairs and use them. Fix handrails that are broken or loose. Make sure that handrails are as long as the stairways. Check any carpeting to make sure that it is firmly attached to the stairs. Fix any carpet that is loose or worn. Avoid having throw rugs at the top or bottom of the stairs. If you do have throw rugs, attach them to the floor with carpet tape. Make sure that you have a light switch at the top of the stairs and the bottom of the stairs. If you do not have them, ask someone to add them for you. What else can I do to help prevent falls? Wear shoes that: Do not have high heels. Have rubber bottoms. Are comfortable and fit you well. Are closed at the toe. Do not wear sandals. If you use a stepladder: Make sure that it is  fully opened. Do not climb a closed stepladder. Make sure that both sides of the stepladder are locked into place. Ask someone to hold it for you, if possible. Clearly mark and make sure that you can see: Any grab bars or handrails. First and last steps. Where the edge of each step is. Use tools that help you move around (mobility aids) if they are needed. These include: Canes. Walkers. Scooters. Crutches. Turn on the lights when you go into a dark area. Replace any light bulbs as soon as they burn out. Set up your furniture so you have a clear path. Avoid moving your furniture around. If any of your floors are uneven, fix them. If there are any pets around you, be aware of where they are. Review your medicines with your doctor. Some medicines can make you feel dizzy. This can increase your chance of falling. Ask your doctor what other things that you can do to help prevent falls. This information is not intended to replace advice given to you by your health care provider. Make sure you discuss any questions you have with your health care provider. Document Released: 02/11/2009 Document Revised: 09/23/2015 Document Reviewed: 05/22/2014 Elsevier Interactive Patient Education  2017 [employer]..

## 2022-12-08 NOTE — Progress Notes (Signed)
Subjective:   Monique Baker is a 71 y.o. female who presents for Medicare Annual (Subsequent) preventive examination.  Visit Complete: Virtual  I connected with  Kittie Plater on 12/08/22 by a audio enabled telemedicine application and verified that I am speaking with the correct person using two identifiers.  Patient Location: Home  Provider Location: Home Office  I discussed the limitations of evaluation and management by telemedicine. The patient expressed understanding and agreed to proceed.  Vital Signs: Unable to obtain new vitals due to this being a telehealth visit.  Patient Medicare AWV questionnaire was completed by the patient on (not done); I have confirmed that all information answered by patient is correct and no changes since this date.  Review of Systems    Cardiac Risk Factors include: advanced age (>7men, >71 women);hypertension;obesity (BMI >30kg/m2)    Objective:    Today's Vitals   12/08/22 1515  Weight: 199 lb (90.3 kg)  Height: 5' 2.5" (1.588 m)   Body mass index is 35.82 kg/m.     12/08/2022    3:30 PM 10/28/2020   10:05 AM 10/28/2019    9:40 AM 10/22/2018   10:11 AM 10/12/2017    9:51 AM 03/28/2017    9:35 AM 01/18/2017    8:30 AM  Advanced Directives  Does Patient Have a Medical Advance Directive? No No No No No No No  Does patient want to make changes to medical advance directive?  Yes (MAU/Ambulatory/Procedural Areas - Information given)       Would patient like information on creating a medical advance directive?   Yes (MAU/Ambulatory/Procedural Areas - Information given) Yes (MAU/Ambulatory/Procedural Areas - Information given) Yes (MAU/Ambulatory/Procedural Areas - Information given) No - Patient declined     Current Medications (verified) Outpatient Encounter Medications as of 12/08/2022  Medication Sig   acetaminophen (TYLENOL) 500 MG tablet Take by mouth. Reported on 11/15/2015   aspirin EC 81 MG tablet Take 81 mg by mouth daily.    atorvastatin (LIPITOR) 40 MG tablet Take 1 tablet (40 mg total) by mouth every evening. for cholesterol   Azelastine HCl 137 MCG/SPRAY SOLN PLACE 2 SPRAYS INTO BOTH NOSTRILS 2 (TWO) TIMES DAILY. USE IN EACH NOSTRIL AS DIRECTED   buPROPion (WELLBUTRIN XL) 150 MG 24 hr tablet TAKE 1 TABLET BY MOUTH EVERY DAY   Cholecalciferol (VITAMIN D) 50 MCG (2000 UT) CAPS Take 1 capsule (2,000 Units total) by mouth daily.   citalopram (CELEXA) 20 MG tablet Take 1 tablet (20 mg total) by mouth daily.   Cyanocobalamin (B-12) 1000 MCG SUBL Place 1 each under the tongue 2 (two) times a week.    glucose blood (ONETOUCH VERIO) test strip Use as instructed   loratadine (CLARITIN) 10 MG tablet Take 1 tablet (10 mg total) by mouth daily.   metFORMIN (GLUCOPHAGE) 500 MG tablet Take 1 tablet (500 mg total) by mouth 2 (two) times daily.   nadolol (CORGARD) 40 MG tablet Take 1 tablet (40 mg total) by mouth daily.   Olmesartan-amLODIPine-HCTZ 40-5-12.5 MG TABS Take 1 tablet by mouth daily.   triamcinolone cream (KENALOG) 0.1 % Reported on 07/15/2015   No facility-administered encounter medications on file as of 12/08/2022.    Allergies (verified) Duloxetine   History: Past Medical History:  Diagnosis Date   Allergy    Depression    GERD (gastroesophageal reflux disease)    Hyperlipidemia    Hypertension    Internal hemorrhoids    Menopausal disorder    Osteoarthritis  hands   Prediabetes    Wears dentures    partial upper   Past Surgical History:  Procedure Laterality Date   ABDOMINAL HYSTERECTOMY     CATARACT EXTRACTION, BILATERAL     CESAREAN SECTION     COLONOSCOPY WITH PROPOFOL N/A 03/15/2016   Procedure: COLONOSCOPY WITH PROPOFOL;  Surgeon: Kieth Brightly, MD;  Location: [facility];  Service: Endoscopy;  Laterality: N/A;   HALLUX VALGUS LAPIDUS Left 06/16/2015   Procedure: HALLUX VALGUS LAPIDUS;  Surgeon: Gwyneth Revels, DPM;  Location: [facility];  Service: Podiatry;   Laterality: Left;  WITH POPLITEAL   HAMMER TOE SURGERY Left 06/16/2015   Procedure: HAMMER TOE CORRECTION SECOND TOE LEFT;  Surgeon: Gwyneth Revels, DPM;  Location: [facility];  Service: Podiatry;  Laterality: Left;  prediabetic - on oral meds   hip replacemanet Right 03/15/2018   Fayrene Fearing [facility]   MYOMECTOMY     OOPHORECTOMY     trigger finger Right 12/15/2014   Vulva cyst  11/2006   Family History  Problem Relation Age of Onset   Dementia Mother    Diabetes Father    Stroke Father    Cancer Daughter        Fibroid Tumors   Diabetes Brother    Breast cancer Neg Hx    Social History   Socioeconomic History   Marital status: Married    Spouse name: Adela Glimpse   Number of children: 1   Years of education: Not on file   Highest education level: Bachelor's degree (e.g., BA, AB, BS)  Occupational History   Occupation: retired Runner, broadcasting/film/video    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 [place]  Tobacco Use   Smoking status: Never   Smokeless tobacco: Never   Tobacco comments:    smoking cessation materials not required  Vaping Use   Vaping status: Never Used  Substance and Sexual Activity   Alcohol use: No    Alcohol/week: 0.0 standard drinks of alcohol   Drug use: No   Sexual activity: Not Currently    Partners: Male  Other Topics Concern   Not on file  Social History Narrative   Married to a Education officer, environmental and he is a professor at [employer] Armed forces training and education officer )    Social Determinants of Health   Financial Resource Strain: Low Risk  (12/08/2022)   Overall Financial Resource Strain (CARDIA)    Difficulty of Paying Living Expenses: Not hard at all  Food Insecurity: No Food Insecurity (12/08/2022)   Hunger Vital Sign    Worried About Running Out of Food in the Last Year: Never true    Ran Out of Food in the Last Year: Never true  Transportation Needs: No Transportation Needs (12/08/2022)   PRAPARE - Administrator, Civil Service (Medical): No    Lack of Transportation (Non-Medical): No  Physical  Activity: Sufficiently Active (12/08/2022)   Exercise Vital Sign    Days of Exercise per Week: 4 days    Minutes of Exercise per Session: 80 min  Stress: Stress Concern Present (12/08/2022)   [employer] of Occupational Health - Occupational Stress Questionnaire    Feeling of Stress : To some extent  Social Connections: Socially Integrated (12/08/2022)   Social Connection and Isolation Panel [NHANES]    Frequency of Communication with Friends and Family: More than three times a week    Frequency of Social Gatherings with Friends and Family: Twice a week    Attends Religious Services: More than 4 times per year  Active Member of Clubs or Organizations: Yes    Attends Engineer, structural: More than 4 times per year    Marital Status: Married    Tobacco Counseling Counseling given: Not Answered Tobacco comments: smoking cessation materials not required   Clinical Intake:  Pre-visit preparation completed: Yes  Pain : No/denies pain     BMI - recorded: 35.82 Nutritional Status: BMI > 30  Obese Nutritional Risks: None Diabetes: No  How often do you need to have someone help you when you read instructions, pamphlets, or other written materials from your doctor or pharmacy?: 1 - Never  Interpreter Needed?: No  Comments: lives with husband Information entered by :: B.,LPN   Activities of Daily Living    12/08/2022    3:32 PM 09/14/2022    8:51 AM  In your present state of health, do you have any difficulty performing the following activities:  Hearing? 0 0  Vision? 0 0  Difficulty concentrating or making decisions? 0 0  Walking or climbing stairs? 0 0  Dressing or bathing? 0 0  Doing errands, shopping? 0 0  Preparing Food and eating ? N   Using the Toilet? N   In the past six months, have you accidently leaked urine? N   Do you have problems with loss of bowel control? N   Managing your Medications? N   Managing your Finances? N   Housekeeping or  managing your Housekeeping? N     Patient Care Team: Alba Cory, MD as PCP - General (Family Medicine) [facility], [facility] (Optometry)  Indicate any recent Medical Services you may have received from other than [facility] providers in the past year (date may be approximate).     Assessment:   This is a routine wellness examination for [place].  Hearing/Vision screen Hearing Screening - Comments:: Adequate hearing Vision Screening - Comments:: Adequate vision after cataract surgery;glasses [facility]  Dietary issues and exercise activities discussed:     Goals Addressed             This Visit's Progress    DIET   Not on track    Recommend to decrease portion sizes by eating 3 small healthy meals and at least 2 healthy snacks per day.     DIET - INCREASE WATER INTAKE   Not on track    Recommend to drink at least 6-8 8oz glasses of water per day.       Depression Screen    12/08/2022    3:26 PM 09/14/2022    8:50 AM 05/23/2022    8:43 AM 02/01/2022   11:16 AM 12/08/2021   10:29 AM 11/08/2021    9:13 AM 08/17/2021    3:10 PM  PHQ 2/9 Scores  PHQ - 2 Score 0 0 0 2 0 0 1  PHQ- 9 Score  0 1 4  0 1    Fall Risk    12/08/2022    3:19 PM 09/14/2022    8:50 AM 05/23/2022    8:43 AM 02/01/2022   11:16 AM 12/08/2021   10:29 AM  Fall Risk   Falls in the past year? 0 1 1 0 0  Number falls in past yr: 0 1 0 0 0  Injury with Fall? 0 0 0 0 0  Risk for fall due to : No Fall Risks No Fall Risks History of fall(s) No Fall Risks   Follow up Education provided;Falls prevention discussed Falls prevention discussed Falls prevention discussed;Education provided;Falls  evaluation completed Falls prevention discussed Falls evaluation completed    MEDICARE RISK AT HOME:  Medicare Risk at Home - 12/08/22 1520     Any stairs in or around the home? Yes    If so, are there any without handrails? Yes    Home free of loose throw rugs in walkways, pet beds, electrical cords, etc? Yes     Adequate lighting in your home to reduce risk of falls? Yes    Life alert? No   watch   Use of a cane, walker or w/c? No    Grab bars in the bathroom? Yes    Shower chair or bench in shower? Yes    Elevated toilet seat or a handicapped toilet? No             TIMED UP AND GO:  Was the test performed?  No    Cognitive Function:        12/08/2022    3:37 PM 11/08/2021   10:26 AM 10/28/2019    9:41 AM 10/22/2018   10:19 AM 10/12/2017    9:52 AM  6CIT Screen  What Year? 0 points 0 points 0 points 0 points 0 points  What month? 0 points 0 points 0 points 0 points 0 points  What time? 0 points 0 points 0 points 0 points 0 points  Count back from 20 0 points 0 points 0 points 0 points 0 points  Months in reverse 0 points 0 points 0 points 0 points 0 points  Repeat phrase 0 points 0 points 0 points 2 points 0 points  Total Score 0 points 0 points 0 points 2 points 0 points    Immunizations Immunization History  Administered Date(s) Administered   COVID-19, mRNA, vaccine(Comirnaty)12 years and older 04/11/2022   Fluad Quad(high Dose 65+) 02/19/2019, 01/26/2021, 02/01/2022   Influenza, High Dose Seasonal PF 01/18/2017, 01/14/2018   Influenza,inj,Quad PF,6+ Mos 01/11/2015, 01/17/2016, 03/15/2020   Influenza-Unspecified 01/06/2014   PFIZER Comirnaty(Gray Top)Covid-19 Tri-Sucrose Vaccine 08/27/2020   PFIZER(Purple Top)SARS-COV-2 Vaccination 06/23/2019, 07/14/2019, 02/03/2020, 03/07/2021   PNEUMOCOCCAL CONJUGATE-20 02/01/2022   Pneumococcal Conjugate-13 07/23/2017   Pneumococcal Polysaccharide-23 08/31/2009   Tdap 05/10/2010, 07/23/2017   Zoster Recombinant(Shingrix) 06/22/2021, 10/18/2021   Zoster, Live 12/28/2011    TDAP status: Up to date  Flu Vaccine status: Up to date  Pneumococcal vaccine status: Up to date  Covid-19 vaccine status: Completed vaccines  Qualifies for Shingles Vaccine? Yes   Zostavax completed Yes   Shingrix Completed?: Yes  Screening Tests Health  Maintenance  Topic Date Due   COVID-19 Vaccine (7 - 2023-24 season) 06/06/2022   INFLUENZA VACCINE  11/30/2022   MAMMOGRAM  10/24/2023   Medicare Annual Wellness (AWV)  12/08/2023   Colonoscopy  03/15/2026   DTaP/Tdap/Td (3 - Td or Tdap) 07/24/2027   Pneumonia Vaccine 59+ Years old  Completed   DEXA SCAN  Completed   Hepatitis C Screening  Completed   Zoster Vaccines- Shingrix  Completed   HPV VACCINES  Aged Out    Health Maintenance  Health Maintenance Due  Topic Date Due   COVID-19 Vaccine (7 - 2023-24 season) 06/06/2022   INFLUENZA VACCINE  11/30/2022    Colorectal cancer screening: Type of screening: Colonoscopy. Completed yes. Repeat every 10 years  Mammogram status: Completed yes. Repeat every year  Bone Density status: Completed yes. Results reflect: Bone density results: OSTEOPENIA. Repeat every 3-5 years.  Lung Cancer Screening: (Low Dose CT Chest recommended if Age 38-80 years, 20 pack-year currently  smoking OR have quit w/in 15years.) does not qualify.   Lung Cancer Screening Referral: no  Additional Screening:  Hepatitis C Screening: does not qualify; Completed yes  Vision Screening: Recommended annual ophthalmology exams for early detection of glaucoma and other disorders of the eye. Is the patient up to date with their annual eye exam?  Yes  Who is the provider or what is the name of the office in which the patient attends annual eye exams? [facility] If pt is not established with a provider, would they like to be referred to a provider to establish care? No .   Dental Screening: Recommended annual dental exams for proper oral hygiene  Diabetic Foot Exam: n/a  Community Resource Referral / Chronic Care Management: CRR required this visit?  No   CCM required this visit?  No    Plan:     I have personally reviewed and noted the following in the patient's chart:   Medical and social history Use of alcohol, tobacco or illicit drugs  Current  medications and supplements including opioid prescriptions. Patient is not currently taking opioid prescriptions. Functional ability and status Nutritional status Physical activity Advanced directives List of other physicians Hospitalizations, surgeries, and ER visits in previous 12 months Vitals Screenings to include cognitive, depression, and falls Referrals and appointments  In addition, I have reviewed and discussed with patient certain preventive protocols, quality metrics, and best practice recommendations. A written personalized care plan for preventive services as well as general preventive health recommendations were provided to patient.     Sue Lush, LPN   [id number]   After Visit Summary: (MyChart) Due to this being a telephonic visit, the after visit summary with patients personalized plan was offered to patient via MyChart   Nurse Notes: The patient states she is doing well and has no concerns or questions at this time.

## 2022-12-29 DIAGNOSIS — N390 Urinary tract infection, site not specified: Secondary | ICD-10-CM | POA: Diagnosis not present

## 2022-12-29 DIAGNOSIS — U071 COVID-19: Secondary | ICD-10-CM | POA: Diagnosis not present

## 2022-12-29 DIAGNOSIS — R350 Frequency of micturition: Secondary | ICD-10-CM | POA: Diagnosis not present

## 2022-12-29 DIAGNOSIS — Z20822 Contact with and (suspected) exposure to covid-19: Secondary | ICD-10-CM | POA: Diagnosis not present

## 2023-01-29 ENCOUNTER — Other Ambulatory Visit: Payer: Self-pay | Admitting: Family Medicine

## 2023-01-29 DIAGNOSIS — E785 Hyperlipidemia, unspecified: Secondary | ICD-10-CM

## 2023-02-22 DIAGNOSIS — H43813 Vitreous degeneration, bilateral: Secondary | ICD-10-CM | POA: Diagnosis not present

## 2023-02-22 DIAGNOSIS — Z961 Presence of intraocular lens: Secondary | ICD-10-CM | POA: Diagnosis not present

## 2023-02-22 DIAGNOSIS — H35372 Puckering of macula, left eye: Secondary | ICD-10-CM | POA: Diagnosis not present

## 2023-03-13 NOTE — Progress Notes (Unsigned)
Name: Monique Baker   MRN: [id number]    DOB: August 14, 1950   Date:03/14/2023       Progress Note  Subjective  Chief Complaint  Follow Up  HPI  HTN: doing well on current regiment, bp has been well controlled, no recent episodes of chest pain or palpitation . We will check labs today   Depression Major in remission : she is married to a Education officer, environmental, he also works as a Airline pilot at [employer]. We started her on Cymbalta 10/2015 was doing better but caused hair loss, currently on Citalopram band tried to wean self off last year but symptoms returned, she is back on Citalopram daily . She is taking care of grandkids after school ( that is a challenge at times)   Pre diabetes/metabolic syndrome: she is taking Metformin, denies side effects, hgbA1C went up to 5.8 % she is following Weight Watchers diet loosely    Right hip pain/OA: she had right hip replacement back in Nov 2019 by Dr. Odis Luster She is now pain free, no longer using a cane , not taking any medications for it at this time. She has not been taking NSAID's , she takes Tylenol prn   Chronic kidney disease stage III a: we will recheck level today, she is on ARB. No pruritus   Morbid obesity: BMI over 35 with co-morbidities such as HTN, CKI and prediabetes ,  she joined weight watchers 05/2016 at a weight of 224 lbs  She stopped going for a while but has been back to [employer] at the end of 2022 and weight was down to 202 lbs January 2024. She is also going to the gym a couple of a times a week and spends about 2 hours between line dance classes and machines. Weight was down to 199 lbs today. She is getting frustrated , her goal is to go down to a size 16 so she can wear the dresses that are in her closet. She has not been consistent counting her points , she is still drinking sodas twice daily. Discussed consistency    AR: doing well on prn medication , she does not like nasal spray    Patient Active Problem List   Diagnosis Date  Noted   Osteoarthritis of hip 02/19/2018   Osteopenia of hip 09/04/2016   History of bunionectomy of left great toe 07/15/2015   H/O hammer toe correction 03/15/2015   Arthritis of lumbar spine 01/19/2015   Benign essential HTN 11/05/2014   Carpal tunnel syndrome 11/05/2014   Osteoarthritis 11/05/2014   Dermatitis, eczematoid 11/05/2014   Depression, major, recurrent, mild (HCC) 11/05/2014   Dyslipidemia 11/05/2014   Gastro-esophageal reflux disease without esophagitis 11/05/2014   Morbid obesity (HCC) 11/05/2014   Dysmetabolic syndrome 11/05/2014   Menopausal and perimenopausal disorder 11/05/2014   Perennial allergic rhinitis with seasonal variation 11/05/2014   Seborrhea capitis 11/05/2014   Acquired trigger finger 11/05/2014   Urinary incontinence in female 11/05/2014   Vitamin D deficiency 11/05/2014   Blood glucose elevated 08/31/2009    Past Surgical History:  Procedure Laterality Date   ABDOMINAL HYSTERECTOMY     CATARACT EXTRACTION, BILATERAL     CESAREAN SECTION     COLONOSCOPY WITH PROPOFOL N/A 03/15/2016   Procedure: COLONOSCOPY WITH PROPOFOL;  Surgeon: Kieth Brightly, MD;  Location: [facility];  Service: Endoscopy;  Laterality: N/A;   HALLUX VALGUS LAPIDUS Left 06/16/2015   Procedure: HALLUX VALGUS LAPIDUS;  Surgeon: Gwyneth Revels, DPM;  Location: [facility];  Service: Podiatry;  Laterality: Left;  WITH POPLITEAL   HAMMER TOE SURGERY Left 06/16/2015   Procedure: HAMMER TOE CORRECTION SECOND TOE LEFT;  Surgeon: Gwyneth Revels, DPM;  Location: [facility];  Service: Podiatry;  Laterality: Left;  prediabetic - on oral meds   hip replacemanet Right 03/15/2018   Fayrene Fearing Bowers-Emergeortho   MYOMECTOMY     OOPHORECTOMY     trigger finger Right 12/15/2014   Vulva cyst  11/2006    Family History  Problem Relation Age of Onset   Dementia Mother    Diabetes Father    Stroke Father    Cancer Daughter        Fibroid Tumors   Diabetes Brother     Breast cancer Neg Hx     Social History   Tobacco Use   Smoking status: Never   Smokeless tobacco: Never   Tobacco comments:    smoking cessation materials not required  Substance Use Topics   Alcohol use: No    Alcohol/week: 0.0 standard drinks of alcohol     Current Outpatient Medications:    acetaminophen (TYLENOL) 500 MG tablet, Take by mouth. Reported on 11/15/2015, Disp: , Rfl:    aspirin EC 81 MG tablet, Take 81 mg by mouth daily., Disp: , Rfl:    atorvastatin (LIPITOR) 40 MG tablet, TAKE 1 TABLET (40 MG TOTAL) BY MOUTH EVERY EVENING. FOR CHOLESTEROL, Disp: 30 tablet, Rfl: 0   Azelastine HCl 137 MCG/SPRAY SOLN, PLACE 2 SPRAYS INTO BOTH NOSTRILS 2 (TWO) TIMES DAILY. USE IN EACH NOSTRIL AS DIRECTED, Disp: 90 mL, Rfl: 0   buPROPion (WELLBUTRIN XL) 150 MG 24 hr tablet, TAKE 1 TABLET BY MOUTH EVERY DAY, Disp: 90 tablet, Rfl: 1   Cholecalciferol (VITAMIN D) 50 MCG (2000 UT) CAPS, Take 1 capsule (2,000 Units total) by mouth daily., Disp: 30 capsule, Rfl: 0   citalopram (CELEXA) 20 MG tablet, Take 1 tablet (20 mg total) by mouth daily., Disp: 90 tablet, Rfl: 0   Cyanocobalamin (B-12) 1000 MCG SUBL, Place 1 each under the tongue 2 (two) times a week. , Disp: , Rfl:    glucose blood (ONETOUCH VERIO) test strip, Use as instructed, Disp: 100 each, Rfl: 12   loratadine (CLARITIN) 10 MG tablet, Take 1 tablet (10 mg total) by mouth daily., Disp: 90 tablet, Rfl: 1   metFORMIN (GLUCOPHAGE) 500 MG tablet, Take 1 tablet (500 mg total) by mouth 2 (two) times daily., Disp: 180 tablet, Rfl: 1   nadolol (CORGARD) 40 MG tablet, Take 1 tablet (40 mg total) by mouth daily., Disp: 90 tablet, Rfl: 1   Olmesartan-amLODIPine-HCTZ 40-5-12.5 MG TABS, Take 1 tablet by mouth daily., Disp: 90 tablet, Rfl: 1   triamcinolone cream (KENALOG) 0.1 %, Reported on 07/15/2015, Disp: , Rfl:   Allergies  Allergen Reactions   Duloxetine     sweat    I personally reviewed active problem list, medication list,  allergies, family history, social history, health maintenance with the patient/caregiver today.   ROS  Ten systems reviewed and is negative except as mentioned in HPI    Objective  Vitals:   03/14/23 0846  BP: 126/72  Pulse: 66  Resp: 16  Temp: 97.9 F (36.6 C)  TempSrc: Oral  SpO2: 97%  Weight: 205 lb 1.6 oz (93 kg)  Height: 5\' 2"  (1.575 m)    Body mass index is 37.51 kg/m.  Physical Exam  Constitutional: Patient appears well-developed and well-nourished. Obese  No distress.  HEENT: head atraumatic, normocephalic,  pupils equal and reactive to light, neck supple Cardiovascular: Normal rate, regular rhythm and normal heart sounds.  No murmur heard. No BLE edema. Pulmonary/Chest: Effort normal and breath sounds normal. No respiratory distress. Abdominal: Soft.  There is no tenderness. Psychiatric: Patient has a normal mood and affect. behavior is normal. Judgment and thought content normal.    PHQ2/9:    03/14/2023    8:48 AM 12/08/2022    3:26 PM 09/14/2022    8:50 AM 05/23/2022    8:43 AM 02/01/2022   11:16 AM  Depression screen PHQ 2/9  Decreased Interest 1 0 0 0 1  Down, Depressed, Hopeless 2 0 0 0 1  PHQ - 2 Score 3 0 0 0 2  Altered sleeping 0  0 0 0  Tired, decreased energy 2  0 1 2  Change in appetite 2  0 0 0  Feeling bad or failure about yourself  0  0 0 0  Trouble concentrating 0  0 0 0  Moving slowly or fidgety/restless 0  0 0 0  Suicidal thoughts 0  0 0 0  PHQ-9 Score 7  0 1 4  Difficult doing work/chores Somewhat difficult   Not difficult at all     phq 9 is positive - discussed going up on Wellbutrin for the holidays but not interested at this time   Fall Risk:    03/14/2023    8:47 AM 12/08/2022    3:19 PM 09/14/2022    8:50 AM 05/23/2022    8:43 AM 02/01/2022   11:16 AM  Fall Risk   Falls in the past year? 1 0 1 1 0  Number falls in past yr: 0 0 1 0 0  Injury with Fall? 0 0 0 0 0  Risk for fall due to : History of fall(s) No Fall Risks No  Fall Risks History of fall(s) No Fall Risks  Follow up Falls prevention discussed;Education provided;Falls evaluation completed Education provided;Falls prevention discussed Falls prevention discussed Falls prevention discussed;Education provided;Falls evaluation completed Falls prevention discussed      Functional Status Survey: Is the patient deaf or have difficulty hearing?: No Does the patient have difficulty seeing, even when wearing glasses/contacts?: No Does the patient have difficulty concentrating, remembering, or making decisions?: No Does the patient have difficulty walking or climbing stairs?: No Does the patient have difficulty dressing or bathing?: No Does the patient have difficulty doing errands alone such as visiting a doctor's office or shopping?: No    Assessment & Plan  1. Major depression in remission (HCC)  - citalopram (CELEXA) 20 MG tablet; Take 1 tablet (20 mg total) by mouth daily.  Dispense: 90 tablet; Refill: 1  2. Depression, major, recurrent, mild (HCC)  - buPROPion (WELLBUTRIN XL) 150 MG 24 hr tablet; Take 1 tablet (150 mg total) by mouth daily.  Dispense: 90 tablet; Refill: 1  3. Morbid obesity (HCC)  On weight watchers but still gaining weight, still drinking sodas twice daily   4. Stage 3a chronic kidney disease (HCC)  - CBC with Differential/Platelet - COMPLETE METABOLIC PANEL WITH GFR  5. Benign essential HTN  - Olmesartan-amLODIPine-HCTZ 40-5-12.5 MG TABS; Take 1 tablet by mouth daily.  Dispense: 90 tablet; Refill: 1 - nadolol (CORGARD) 40 MG tablet; Take 1 tablet (40 mg total) by mouth daily.  Dispense: 90 tablet; Refill: 1  6. Need for immunization against influenza  - Flu Vaccine Trivalent High Dose (Fluad)  7. Hyperglycemia  - metFORMIN (GLUCOPHAGE) 500 MG  tablet; Take 1 tablet (500 mg total) by mouth 2 (two) times daily.  Dispense: 180 tablet; Refill: 1  8. GAD (generalized anxiety disorder)  - citalopram (CELEXA) 20 MG  tablet; Take 1 tablet (20 mg total) by mouth daily.  Dispense: 90 tablet; Refill: 1  9. Dyslipidemia  - atorvastatin (LIPITOR) 40 MG tablet; Take 1 tablet (40 mg total) by mouth every evening. for cholesterol  Dispense: 90 tablet; Refill: 1 - Lipid panel  10. Vitamin D deficiency  - VITAMIN D 25 Hydroxy (Vit-D Deficiency, Fractures)  11. B12 deficiency  - B12 and Folate Panel

## 2023-03-14 ENCOUNTER — Encounter: Payer: Self-pay | Admitting: Family Medicine

## 2023-03-14 ENCOUNTER — Ambulatory Visit: Payer: Medicare PPO | Admitting: Family Medicine

## 2023-03-14 VITALS — BP 126/72 | HR 66 | Temp 97.9°F | Resp 16 | Ht 62.0 in | Wt 205.1 lb

## 2023-03-14 DIAGNOSIS — R5383 Other fatigue: Secondary | ICD-10-CM | POA: Diagnosis not present

## 2023-03-14 DIAGNOSIS — E785 Hyperlipidemia, unspecified: Secondary | ICD-10-CM | POA: Diagnosis not present

## 2023-03-14 DIAGNOSIS — E538 Deficiency of other specified B group vitamins: Secondary | ICD-10-CM

## 2023-03-14 DIAGNOSIS — E559 Vitamin D deficiency, unspecified: Secondary | ICD-10-CM

## 2023-03-14 DIAGNOSIS — N1831 Chronic kidney disease, stage 3a: Secondary | ICD-10-CM | POA: Diagnosis not present

## 2023-03-14 DIAGNOSIS — F411 Generalized anxiety disorder: Secondary | ICD-10-CM | POA: Diagnosis not present

## 2023-03-14 DIAGNOSIS — R739 Hyperglycemia, unspecified: Secondary | ICD-10-CM

## 2023-03-14 DIAGNOSIS — I10 Essential (primary) hypertension: Secondary | ICD-10-CM | POA: Diagnosis not present

## 2023-03-14 DIAGNOSIS — F325 Major depressive disorder, single episode, in full remission: Secondary | ICD-10-CM

## 2023-03-14 DIAGNOSIS — F330 Major depressive disorder, recurrent, mild: Secondary | ICD-10-CM | POA: Diagnosis not present

## 2023-03-14 DIAGNOSIS — Z23 Encounter for immunization: Secondary | ICD-10-CM

## 2023-03-14 MED ORDER — BUPROPION HCL ER (XL) 150 MG PO TB24: 150.0000 mg | ORAL_TABLET | Freq: Every day | ORAL | 1 refills | Status: DC

## 2023-03-14 MED ORDER — METFORMIN HCL 500 MG PO TABS
500.0000 mg | ORAL_TABLET | Freq: Two times a day (BID) | ORAL | 1 refills | Status: DC
Start: 2023-03-14 — End: 2023-10-04

## 2023-03-14 MED ORDER — CITALOPRAM HYDROBROMIDE 20 MG PO TABS: 20.0000 mg | ORAL_TABLET | Freq: Every day | ORAL | 1 refills | Status: DC

## 2023-03-14 MED ORDER — ATORVASTATIN CALCIUM 40 MG PO TABS: 40.0000 mg | ORAL_TABLET | Freq: Every evening | ORAL | 1 refills | Status: DC

## 2023-03-14 MED ORDER — OLMESARTAN-AMLODIPINE-HCTZ 40-5-12.5 MG PO TABS: 1.0000 | ORAL_TABLET | Freq: Every day | ORAL | 1 refills | Status: DC

## 2023-03-14 MED ORDER — NADOLOL 40 MG PO TABS: 40.0000 mg | ORAL_TABLET | Freq: Every day | ORAL | 1 refills | Status: DC

## 2023-03-15 LAB — COMPLETE METABOLIC PANEL WITH GFR
AG Ratio: 1.5 (calc) (ref 1.0–2.5)
ALT: 9 U/L (ref 6–29)
AST: 15 U/L (ref 10–35)
Albumin: 4.1 g/dL (ref 3.6–5.1)
Alkaline phosphatase (APISO): 69 U/L (ref 37–153)
BUN/Creatinine Ratio: 18 (calc) (ref 6–22)
BUN: 20 mg/dL (ref 7–25)
CO2: 31 mmol/L (ref 20–32)
Calcium: 9.7 mg/dL (ref 8.6–10.4)
Chloride: 102 mmol/L (ref 98–110)
Creat: 1.12 mg/dL — ABNORMAL HIGH (ref 0.60–1.00)
Globulin: 2.8 g/dL (ref 1.9–3.7)
Glucose, Bld: 94 mg/dL (ref 65–99)
Potassium: 3.6 mmol/L (ref 3.5–5.3)
Sodium: 141 mmol/L (ref 135–146)
Total Bilirubin: 0.8 mg/dL (ref 0.2–1.2)
Total Protein: 6.9 g/dL (ref 6.1–8.1)
eGFR: 53 mL/min/{1.73_m2} — ABNORMAL LOW (ref >=60)

## 2023-03-15 LAB — CBC WITH DIFFERENTIAL/PLATELET
Absolute Lymphocytes: 1231 {cells}/uL (ref 850–3900)
Absolute Monocytes: 721 {cells}/uL (ref 200–950)
Basophils Absolute: 32 {cells}/uL (ref 0–200)
Basophils Relative: 0.4 %
Eosinophils Absolute: 203 {cells}/uL (ref 15–500)
Eosinophils Relative: 2.5 %
HCT: 37.7 % (ref 35.0–45.0)
Hemoglobin: 12.2 g/dL (ref 11.7–15.5)
MCH: 27.6 pg (ref 27.0–33.0)
MCHC: 32.4 g/dL (ref 32.0–36.0)
MCV: 85.3 fL (ref 80.0–100.0)
MPV: 10.1 fL (ref 7.5–12.5)
Monocytes Relative: 8.9 %
Neutro Abs: 5913 {cells}/uL (ref 1500–7800)
Neutrophils Relative %: 73 %
Platelets: 366 10*3/uL (ref 140–400)
RBC: 4.42 10*6/uL (ref 3.80–5.10)
RDW: 13.1 % (ref 11.0–15.0)
Total Lymphocyte: 15.2 %
WBC: 8.1 10*3/uL (ref 3.8–10.8)

## 2023-03-15 LAB — VITAMIN D 25 HYDROXY (VIT D DEFICIENCY, FRACTURES): Vit D, 25-Hydroxy: 84 ng/mL (ref 30–100)

## 2023-03-15 LAB — LIPID PANEL
Cholesterol: 137 mg/dL (ref <200)
HDL: 56 mg/dL (ref >=50)
LDL Cholesterol (Calc): 63 mg/dL
Non-HDL Cholesterol (Calc): 81 mg/dL (ref <130)
Total CHOL/HDL Ratio: 2.4 (calc) (ref <5.0)
Triglycerides: 96 mg/dL (ref <150)

## 2023-03-15 LAB — TSH: TSH: 1.83 m[IU]/L (ref 0.40–4.50)

## 2023-03-15 LAB — B12 AND FOLATE PANEL
Folate: 11.6 ng/mL
Vitamin B-12: 634 pg/mL (ref 200–1100)

## 2023-07-17 ENCOUNTER — Encounter: Payer: Self-pay | Admitting: Family Medicine

## 2023-07-17 ENCOUNTER — Ambulatory Visit: Payer: Medicare PPO | Admitting: Family Medicine

## 2023-07-17 VITALS — BP 124/74 | HR 73 | Resp 16 | Ht 62.0 in | Wt 200.9 lb

## 2023-07-17 DIAGNOSIS — R058 Other specified cough: Secondary | ICD-10-CM

## 2023-07-17 DIAGNOSIS — E538 Deficiency of other specified B group vitamins: Secondary | ICD-10-CM

## 2023-07-17 DIAGNOSIS — L308 Other specified dermatitis: Secondary | ICD-10-CM

## 2023-07-17 DIAGNOSIS — E785 Hyperlipidemia, unspecified: Secondary | ICD-10-CM

## 2023-07-17 DIAGNOSIS — J3089 Other allergic rhinitis: Secondary | ICD-10-CM

## 2023-07-17 DIAGNOSIS — N1831 Chronic kidney disease, stage 3a: Secondary | ICD-10-CM | POA: Diagnosis not present

## 2023-07-17 DIAGNOSIS — I10 Essential (primary) hypertension: Secondary | ICD-10-CM

## 2023-07-17 DIAGNOSIS — J302 Other seasonal allergic rhinitis: Secondary | ICD-10-CM

## 2023-07-17 MED ORDER — MONTELUKAST SODIUM 10 MG PO TABS
10.0000 mg | ORAL_TABLET | Freq: Every day | ORAL | 1 refills | Status: DC
Start: 2023-07-17 — End: 2024-01-07

## 2023-07-17 MED ORDER — AZELASTINE HCL 137 MCG/SPRAY NA SOLN
NASAL | 0 refills | Status: AC
Start: 2023-07-17 — End: ?

## 2023-07-17 MED ORDER — TRIAMCINOLONE ACETONIDE 0.1 % EX CREA
TOPICAL_CREAM | Freq: Two times a day (BID) | CUTANEOUS | 0 refills | Status: AC
Start: 2023-07-17 — End: ?

## 2023-07-17 MED ORDER — LORATADINE 10 MG PO TABS: 10.0000 mg | ORAL_TABLET | Freq: Every day | ORAL | 1 refills | Status: DC

## 2023-07-17 NOTE — Progress Notes (Signed)
 Name: Monique Baker   MRN: [id number]    DOB: August 23, 1950   Date:07/17/2023       Progress Note  Subjective  Chief Complaint  Chief Complaint  Patient presents with   Medical Management of Chronic Issues   HPI   HTN: doing well on current regiment, bp has been well controlled, no recent episodes of chest pain or palpitation .   Depression Major in remission : she is married to a Education officer, environmental, he also works as a Airline pilot at [employer]. We started her on Cymbalta 10/2015 was doing better but caused hair loss, currently on Citalopram , she  tried to wean self off last year but symptoms returned, she is back on Citalopram daily . She is taking care of grandkids after school ( that is a challenge at times) . She is doing a little better than last visit.    Pre diabetes/metabolic syndrome: she is taking Metformin, denies side effects, hgbA1C went up to 5.8 % she is going to the gym twice a week and Weight [employer], and going weekly now    Chronic kidney disease stage III a: stable, no pruritus, good urine output, drinking more water and avoiding NSAID's. Taking ARB , explained if kidney function drops we can add SGL-2 agonist    Morbid obesity: BMI over 35 with co-morbidities such as HTN, CKI and prediabetes ,  she joined weight watchers 05/2016 at a weight of 224 lbs  She stopped going for a while but has been back to [employer] at the end of 2022 and weight was down to 202 lbs Weight has been stable since between 199-200 lbs She is working hard to get down to about 190 lbs. Going to the gym and attending [employer]    AR: cannot tolerate flonase due to nose bleeds, needs refill of loratadine and we will send astelin since she has noticed increase in rhinorrhea and congestion. She has noticed a nocturnal cough. No wheezing or SOB. We will try montelukast and she will let me know if it helps     Patient Active Problem List   Diagnosis Date Noted   Osteoarthritis of hip 02/19/2018    Osteopenia of hip 09/04/2016   History of bunionectomy of left great toe 07/15/2015   H/O hammer toe correction 03/15/2015   Arthritis of lumbar spine 01/19/2015   Benign essential HTN 11/05/2014   Carpal tunnel syndrome 11/05/2014   Osteoarthritis 11/05/2014   Dermatitis, eczematoid 11/05/2014   Depression, major, recurrent, mild (HCC) 11/05/2014   Dyslipidemia 11/05/2014   Gastro-esophageal reflux disease without esophagitis 11/05/2014   Morbid obesity (HCC) 11/05/2014   Dysmetabolic syndrome 11/05/2014   Menopausal and perimenopausal disorder 11/05/2014   Perennial allergic rhinitis with seasonal variation 11/05/2014   Seborrhea capitis 11/05/2014   Acquired trigger finger 11/05/2014   Urinary incontinence in female 11/05/2014   Vitamin D deficiency 11/05/2014   Blood glucose elevated 08/31/2009    Past Surgical History:  Procedure Laterality Date   ABDOMINAL HYSTERECTOMY     CATARACT EXTRACTION, BILATERAL     CESAREAN SECTION     COLONOSCOPY WITH PROPOFOL N/A 03/15/2016   Procedure: COLONOSCOPY WITH PROPOFOL;  Surgeon: Kieth Brightly, MD;  Location: [facility];  Service: Endoscopy;  Laterality: N/A;   HALLUX VALGUS LAPIDUS Left 06/16/2015   Procedure: HALLUX VALGUS LAPIDUS;  Surgeon: Gwyneth Revels, DPM;  Location: [facility];  Service: Podiatry;  Laterality: Left;  WITH POPLITEAL   HAMMER TOE SURGERY Left 06/16/2015  Procedure: HAMMER TOE CORRECTION SECOND TOE LEFT;  Surgeon: Gwyneth Revels, DPM;  Location: [facility];  Service: Podiatry;  Laterality: Left;  prediabetic - on oral meds   hip replacemanet Right 03/15/2018   Fayrene Fearing Bowers-Emergeortho   MYOMECTOMY     OOPHORECTOMY     trigger finger Right 12/15/2014   Vulva cyst  11/2006    Family History  Problem Relation Age of Onset   Dementia Mother    Diabetes Father    Stroke Father    Cancer Daughter        Fibroid Tumors   Diabetes Brother    Breast cancer Neg Hx     Social  History   Tobacco Use   Smoking status: Never   Smokeless tobacco: Never   Tobacco comments:    smoking cessation materials not required  Substance Use Topics   Alcohol use: No    Alcohol/week: 0.0 standard drinks of alcohol     Current Outpatient Medications:    acetaminophen (TYLENOL) 500 MG tablet, Take by mouth. Reported on 11/15/2015, Disp: , Rfl:    aspirin EC 81 MG tablet, Take 81 mg by mouth daily., Disp: , Rfl:    atorvastatin (LIPITOR) 40 MG tablet, Take 1 tablet (40 mg total) by mouth every evening. for cholesterol, Disp: 90 tablet, Rfl: 1   buPROPion (WELLBUTRIN XL) 150 MG 24 hr tablet, Take 1 tablet (150 mg total) by mouth daily., Disp: 90 tablet, Rfl: 1   Cholecalciferol (VITAMIN D) 50 MCG (2000 UT) CAPS, Take 1 capsule (2,000 Units total) by mouth daily., Disp: 30 capsule, Rfl: 0   citalopram (CELEXA) 20 MG tablet, Take 1 tablet (20 mg total) by mouth daily., Disp: 90 tablet, Rfl: 1   Cyanocobalamin (B-12) 1000 MCG SUBL, Place 1 each under the tongue 2 (two) times a week. , Disp: , Rfl:    glucose blood (ONETOUCH VERIO) test strip, Use as instructed, Disp: 100 each, Rfl: 12   metFORMIN (GLUCOPHAGE) 500 MG tablet, Take 1 tablet (500 mg total) by mouth 2 (two) times daily., Disp: 180 tablet, Rfl: 1   nadolol (CORGARD) 40 MG tablet, Take 1 tablet (40 mg total) by mouth daily., Disp: 90 tablet, Rfl: 1   Olmesartan-amLODIPine-HCTZ 40-5-12.5 MG TABS, Take 1 tablet by mouth daily., Disp: 90 tablet, Rfl: 1   Turmeric Curcumin 08-998 MG CAPS, Take 1,000 mg by mouth., Disp: , Rfl:    Azelastine HCl 137 MCG/SPRAY SOLN, PLACE 2 SPRAYS INTO BOTH NOSTRILS 2 (TWO) TIMES DAILY. USE IN EACH NOSTRIL AS DIRECTED (Patient not taking: Reported on 07/17/2023), Disp: 90 mL, Rfl: 0   loratadine (CLARITIN) 10 MG tablet, Take 1 tablet (10 mg total) by mouth daily. (Patient not taking: Reported on 07/17/2023), Disp: 90 tablet, Rfl: 1   triamcinolone cream (KENALOG) 0.1 %, Reported on 07/15/2015 (Patient  not taking: Reported on 07/17/2023), Disp: , Rfl:   Allergies  Allergen Reactions   Duloxetine     sweat    I personally reviewed active problem list, medication list, allergies with the patient/caregiver today.   ROS  Ten systems reviewed and is negative except as mentioned in HPI    Objective  Vitals:   07/17/23 0949  BP: 124/74  Pulse: 73  Resp: 16  SpO2: 96%  Weight: 200 lb 14.4 oz (91.1 kg)  Height: 5\' 2"  (1.575 m)    Body mass index is 36.75 kg/m.  Physical Exam  Constitutional: Patient appears well-developed and well-nourished. Obese No distress.  HEENT:  head atraumatic, normocephalic, pupils equal and reactive to light, , neck supple Cardiovascular: Normal rate, regular rhythm and normal heart sounds.  No murmur heard. No BLE edema. Pulmonary/Chest: Effort normal and breath sounds normal. No respiratory distress. Abdominal: Soft.  There is no tenderness. Psychiatric: Patient has a normal mood and affect. behavior is normal. Judgment and thought content normal.    Diabetic Foot Exam:     PHQ2/9:    07/17/2023    9:48 AM 03/14/2023    8:48 AM 12/08/2022    3:26 PM 09/14/2022    8:50 AM 05/23/2022    8:43 AM  Depression screen PHQ 2/9  Decreased Interest 1 1 0 0 0  Down, Depressed, Hopeless 1 2 0 0 0  PHQ - 2 Score 2 3 0 0 0  Altered sleeping 0 0  0 0  Tired, decreased energy 0 2  0 1  Change in appetite 0 2  0 0  Feeling bad or failure about yourself  0 0  0 0  Trouble concentrating 0 0  0 0  Moving slowly or fidgety/restless 0 0  0 0  Suicidal thoughts 0 0  0 0  PHQ-9 Score 2 7  0 1  Difficult doing work/chores Somewhat difficult Somewhat difficult   Not difficult at all    phq 9 is positive  Fall Risk:    03/14/2023    8:47 AM 12/08/2022    3:19 PM 09/14/2022    8:50 AM 05/23/2022    8:43 AM 02/01/2022   11:16 AM  Fall Risk   Falls in the past year? 1 0 1 1 0  Number falls in past yr: 0 0 1 0 0  Injury with Fall? 0 0 0 0 0  Risk for fall  due to : History of fall(s) No Fall Risks No Fall Risks History of fall(s) No Fall Risks  Follow up Falls prevention discussed;Education provided;Falls evaluation completed Education provided;Falls prevention discussed Falls prevention discussed Falls prevention discussed;Education provided;Falls evaluation completed Falls prevention discussed     Assessment & Plan  1. Stage 3a chronic kidney disease (HCC) (Primary)  Avoid nsaid's , continue ARB  2. Perennial allergic rhinitis with seasonal variation  - loratadine (CLARITIN) 10 MG tablet; Take 1 tablet (10 mg total) by mouth daily.  Dispense: 90 tablet; Refill: 1 - Azelastine HCl 137 MCG/SPRAY SOLN; PLACE 2 SPRAYS INTO BOTH NOSTRILS 2 (TWO) TIMES DAILY. USE IN EACH NOSTRIL AS DIRECTED  Dispense: 90 mL; Refill: 0 - montelukast (SINGULAIR) 10 MG tablet; Take 1 tablet (10 mg total) by mouth at bedtime.  Dispense: 90 tablet; Refill: 1  3. Morbid obesity (HCC)  Discussed with the patient the risk posed by an increased BMI. Discussed importance of portion control, calorie counting and at least 150 minutes of physical activity weekly. Avoid sweet beverages and drink more water. Eat at least 6 servings of fruit and vegetables daily    4. B12 deficiency  Taking twice a week   5. Benign essential HTN  BP is at goal, continue medications   6. Dyslipidemia  Continue Atorvastatin  7. Other eczema  - triamcinolone cream (KENALOG) 0.1 %; Apply topically 2 (two) times daily.  Dispense: 45 g; Refill: 0  8. Nocturnal cough  - montelukast (SINGULAIR) 10 MG tablet; Take 1 tablet (10 mg total) by mouth at bedtime.  Dispense: 90 tablet; Refill: 1

## 2023-08-01 ENCOUNTER — Telehealth: Payer: Self-pay

## 2023-08-01 NOTE — Telephone Encounter (Signed)
 Copied from CRM [id number]. Topic: Clinical - Medication Question >> Aug 01, 2023  2:36 PM Monique Baker wrote: Reason for CRM: patient called to see if she can take Ginger Slim with the medication she is taking. Patient will upload a picture of the bottle and send it in her mychart. Please f/u with patient

## 2023-08-02 NOTE — Telephone Encounter (Signed)
 Pt notified- verbalized understanding.

## 2023-09-13 ENCOUNTER — Other Ambulatory Visit: Payer: Self-pay | Admitting: Family Medicine

## 2023-09-13 DIAGNOSIS — F411 Generalized anxiety disorder: Secondary | ICD-10-CM

## 2023-09-13 DIAGNOSIS — F325 Major depressive disorder, single episode, in full remission: Secondary | ICD-10-CM

## 2023-09-21 ENCOUNTER — Other Ambulatory Visit: Payer: Self-pay | Admitting: Family Medicine

## 2023-09-21 DIAGNOSIS — J302 Other seasonal allergic rhinitis: Secondary | ICD-10-CM

## 2023-10-04 ENCOUNTER — Other Ambulatory Visit: Payer: Self-pay | Admitting: Family Medicine

## 2023-10-04 DIAGNOSIS — R739 Hyperglycemia, unspecified: Secondary | ICD-10-CM

## 2023-10-26 ENCOUNTER — Other Ambulatory Visit: Payer: Self-pay | Admitting: Family Medicine

## 2023-10-26 DIAGNOSIS — E785 Hyperlipidemia, unspecified: Secondary | ICD-10-CM

## 2023-10-29 ENCOUNTER — Other Ambulatory Visit: Payer: Self-pay | Admitting: Family Medicine

## 2023-10-29 DIAGNOSIS — Z1231 Encounter for screening mammogram for malignant neoplasm of breast: Secondary | ICD-10-CM

## 2023-11-02 ENCOUNTER — Other Ambulatory Visit: Payer: Self-pay | Admitting: Family Medicine

## 2023-11-02 DIAGNOSIS — R739 Hyperglycemia, unspecified: Secondary | ICD-10-CM

## 2023-11-14 ENCOUNTER — Ambulatory Visit
Admission: RE | Admit: 2023-11-14 | Discharge: 2023-11-14 | Disposition: A | Discharge status: Home / Self Care | Source: Ambulatory Visit | Attending: Family Medicine | Admitting: Family Medicine

## 2023-11-14 DIAGNOSIS — Z1231 Encounter for screening mammogram for malignant neoplasm of breast: Secondary | ICD-10-CM | POA: Diagnosis present

## 2023-11-16 ENCOUNTER — Ambulatory Visit: Admitting: Family Medicine

## 2023-11-21 ENCOUNTER — Ambulatory Visit: Admitting: Family Medicine

## 2023-11-21 ENCOUNTER — Encounter: Payer: Self-pay | Admitting: Family Medicine

## 2023-11-21 DIAGNOSIS — N1831 Chronic kidney disease, stage 3a: Secondary | ICD-10-CM | POA: Diagnosis not present

## 2023-11-21 DIAGNOSIS — E538 Deficiency of other specified B group vitamins: Secondary | ICD-10-CM | POA: Insufficient documentation

## 2023-11-21 DIAGNOSIS — E785 Hyperlipidemia, unspecified: Secondary | ICD-10-CM

## 2023-11-21 DIAGNOSIS — E8881 Metabolic syndrome: Secondary | ICD-10-CM

## 2023-11-21 DIAGNOSIS — R058 Other specified cough: Secondary | ICD-10-CM

## 2023-11-21 DIAGNOSIS — E559 Vitamin D deficiency, unspecified: Secondary | ICD-10-CM

## 2023-11-21 DIAGNOSIS — I10 Essential (primary) hypertension: Secondary | ICD-10-CM

## 2023-11-21 DIAGNOSIS — F330 Major depressive disorder, recurrent, mild: Secondary | ICD-10-CM | POA: Diagnosis not present

## 2023-11-21 MED ORDER — ATORVASTATIN CALCIUM 40 MG PO TABS: 40.0000 mg | ORAL_TABLET | Freq: Every evening | ORAL | 1 refills | Status: DC

## 2023-11-21 MED ORDER — METFORMIN HCL 500 MG PO TABS: 500.0000 mg | ORAL_TABLET | Freq: Two times a day (BID) | ORAL | 1 refills | Status: DC

## 2023-11-21 MED ORDER — NADOLOL 40 MG PO TABS: 40.0000 mg | ORAL_TABLET | Freq: Every day | ORAL | 1 refills | Status: DC

## 2023-11-21 MED ORDER — CONTRAVE 8-90 MG PO TB12
ORAL_TABLET | ORAL | 0 refills | Status: DC
Start: 2023-11-21 — End: 2023-11-21

## 2023-11-21 MED ORDER — OLMESARTAN-AMLODIPINE-HCTZ 40-5-12.5 MG PO TABS: 1.0000 | ORAL_TABLET | Freq: Every day | ORAL | 1 refills | Status: DC

## 2023-11-21 MED ORDER — TRELEGY ELLIPTA 100-62.5-25 MCG/ACT IN AEPB: 1.0000 | INHALATION_SPRAY | Freq: Every day | RESPIRATORY_TRACT | Status: DC

## 2023-11-21 MED ORDER — CONTRAVE 8-90 MG PO TB12
ORAL_TABLET | ORAL | 0 refills | Status: DC
Start: 2023-11-21 — End: 2024-01-02

## 2023-11-21 MED ORDER — BENZONATATE 100 MG PO CAPS
100.0000 mg | ORAL_CAPSULE | Freq: Three times a day (TID) | ORAL | 0 refills | Status: DC | PRN
Start: 2023-11-21 — End: 2024-01-02

## 2023-11-21 NOTE — Progress Notes (Signed)
 Name: Monique Baker   MRN: [id number]    DOB: 07/07/1950   Date:11/21/2023       Progress Note  Subjective  Chief Complaint  Chief Complaint  Patient presents with   Medical Management of Chronic Issues   Cough    Lingering cough on going since June   Discussed the use of AI scribe software for clinical note transcription with the patient, who gave verbal consent to proceed.  History of Present Illness Monique Baker is a 73 year old female with obesity, hypertension, and chronic kidney disease who presents for a four-month follow-up.  In May, she experienced a urinary tract infection with a burning sensation during urination, which resolved with treatment using Macrobid  and AZO.  In June, she developed a persistent cough and was diagnosed with an upper respiratory infection at urgent care. Treatment with amoxicillin improved her symptoms, but she continues to have a mild, dry cough. No shortness of breath, wheezing, or swelling in her legs.  She is concerned about weight gain, noting an increase from 199 pounds last year to 207 pounds currently. She attends Weight Watchers, participates in line dancing classes twice a week, and goes to the gym. She has reduced her soda intake but still struggles with weight management.  She is currently taking metformin  500 mg twice a day for prediabetes and tribenazole for hypertension. She also takes  (nadolol ) 40 mg at night.  She has a history of chronic kidney disease stage 3A, with a GFR of 53 as of November last year. No issues with urination or itching.  She is on Celexa  for major depression and attributes some of her depressive symptoms to weight gain and family stress, particularly involving her daughter and husband.  She inquires about supplements for osteopenia and reports thickened toenails, which were evaluated by a podiatrist and attributed to trauma rather than fungal infection.    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   Benign essential HTN 11/05/2014   Carpal tunnel syndrome 11/05/2014   Osteoarthritis 11/05/2014   Dermatitis, eczematoid 11/05/2014   Depression, major, recurrent, mild (HCC) 11/05/2014   Dyslipidemia 11/05/2014   Gastro-esophageal reflux disease without esophagitis 11/05/2014   Morbid obesity (HCC) 11/05/2014   Dysmetabolic syndrome 11/05/2014   Menopausal and perimenopausal disorder 11/05/2014   Perennial allergic rhinitis with seasonal variation 11/05/2014   Seborrhea capitis 11/05/2014   Acquired trigger finger 11/05/2014   Urinary incontinence in female 11/05/2014   Vitamin D  deficiency 11/05/2014   Blood glucose elevated 08/31/2009    Past Surgical History:  Procedure Laterality Date   ABDOMINAL HYSTERECTOMY     CATARACT EXTRACTION, BILATERAL     CESAREAN SECTION     COLONOSCOPY WITH PROPOFOL  N/A 03/15/2016   Procedure: COLONOSCOPY WITH PROPOFOL ;  Surgeon: Louanne KANDICE Muse, MD;  Location: [facility];  Service: Endoscopy;  Laterality: N/A;   HALLUX VALGUS LAPIDUS Left 06/16/2015   Procedure: HALLUX VALGUS LAPIDUS;  Surgeon: Eva Gay, DPM;  Location: [facility];  Service: Podiatry;  Laterality: Left;  WITH POPLITEAL   HAMMER TOE SURGERY Left 06/16/2015   Procedure: HAMMER TOE CORRECTION SECOND TOE LEFT;  Surgeon: Eva Gay, DPM;  Location: [facility];  Service: Podiatry;  Laterality: Left;  prediabetic - on oral meds  hip replacemanet Right 03/15/2018   [facility]   MYOMECTOMY     OOPHORECTOMY     trigger finger Right 12/15/2014   Vulva cyst  11/2006    Family History  Problem Relation Age of Onset   Dementia Mother    Diabetes Father    Stroke Father    Cancer Daughter        Fibroid Tumors   Diabetes  Brother    Breast cancer Neg Hx     Social History   Tobacco Use   Smoking status: Never   Smokeless tobacco: Never   Tobacco comments:    smoking cessation materials not required  Substance Use Topics   Alcohol use: No    Alcohol/week: 0.0 standard drinks of alcohol     Current Outpatient Medications:    acetaminophen  (TYLENOL ) 500 MG tablet, Take by mouth. Reported on 11/15/2015, Disp: , Rfl:    aspirin EC 81 MG tablet, Take 81 mg by mouth daily., Disp: , Rfl:    atorvastatin  (LIPITOR) 40 MG tablet, TAKE 1 TABLET (40 MG TOTAL) BY MOUTH EVERY EVENING. FOR CHOLESTEROL, Disp: 30 tablet, Rfl: 0   Azelastine  HCl 137 MCG/SPRAY SOLN, PLACE 2 SPRAYS INTO BOTH NOSTRILS 2 (TWO) TIMES DAILY. USE IN EACH NOSTRIL AS DIRECTED, Disp: 90 mL, Rfl: 0   Cholecalciferol (VITAMIN D ) 50 MCG (2000 UT) CAPS, Take 1 capsule (2,000 Units total) by mouth daily., Disp: 30 capsule, Rfl: 0   citalopram  (CELEXA ) 20 MG tablet, TAKE 1 TABLET BY MOUTH EVERY DAY, Disp: 90 tablet, Rfl: 1   Cyanocobalamin  (B-12) 1000 MCG SUBL, Place 1 each under the tongue 2 (two) times a week. , Disp: , Rfl:    glucose blood (ONETOUCH VERIO) test strip, Use as instructed, Disp: 100 each, Rfl: 12   loratadine  (CLARITIN ) 10 MG tablet, Take 1 tablet (10 mg total) by mouth daily., Disp: 90 tablet, Rfl: 1   metFORMIN  (GLUCOPHAGE ) 500 MG tablet, TAKE 1 TABLET BY MOUTH TWICE A DAY, Disp: 60 tablet, Rfl: 0   montelukast  (SINGULAIR ) 10 MG tablet, Take 1 tablet (10 mg total) by mouth at bedtime., Disp: 90 tablet, Rfl: 1   nadolol  (CORGARD ) 40 MG tablet, Take 1 tablet (40 mg total) by mouth daily., Disp: 90 tablet, Rfl: 1   Olmesartan -amLODIPine -HCTZ 40-5-12.5 MG TABS, Take 1 tablet by mouth daily., Disp: 90 tablet, Rfl: 1   triamcinolone  cream (KENALOG ) 0.1 %, Apply topically 2 (two) times daily., Disp: 45 g, Rfl: 0   Turmeric Curcumin 08-998 MG CAPS, Take 1,000 mg by mouth., Disp: , Rfl:    buPROPion  (WELLBUTRIN  XL) 150 MG 24 hr tablet, Take 1  tablet (150 mg total) by mouth daily. (Patient not taking: Reported on 11/21/2023), Disp: 90 tablet, Rfl: 1  Allergies  Allergen Reactions   Duloxetine      sweat    I personally reviewed active problem list, medication list, allergies with the patient/caregiver today.   ROS  Ten systems reviewed and is negative except as mentioned in HPI    Objective Physical Exam VITALS: BP- 130/74 MEASUREMENTS: Weight- 207, BMI- 38.0. CONSTITUTIONAL: Patient appears well-developed and well-nourished. No distress. HEENT: Head atraumatic, normocephalic, neck supple. CARDIOVASCULAR: Normal rate, regular rhythm and normal heart sounds. No murmur heard. No edema in lower extremities. PULMONARY: Effort normal and breath sounds normal. Lungs clear to auscultation bilaterally. No respiratory distress. ABDOMINAL: There is no tenderness or distention. MUSCULOSKELETAL: Normal gait. Without gross motor or sensory deficit. PSYCHIATRIC: Patient has a normal mood and  affect. Behavior is normal. Judgment and thought content normal.  Vitals:   11/21/23 0908  BP: 130/74  Pulse: 69  Resp: 16  SpO2: 99%  Weight: 207 lb 12.8 oz (94.3 kg)  Height: 5' 2 (1.575 m)    Body mass index is 38.01 kg/m.   PHQ2/9:    11/21/2023    9:06 AM 07/17/2023    9:48 AM 03/14/2023    8:48 AM 12/08/2022    3:26 PM 09/14/2022    8:50 AM  Depression screen PHQ 2/9  Decreased Interest 1 1 1  0 0  Down, Depressed, Hopeless 1 1 2  0 0  PHQ - 2 Score 2 2 3  0 0  Altered sleeping 1 0 0  0  Tired, decreased energy 1 0 2  0  Change in appetite 1 0 2  0  Feeling bad or failure about yourself  1 0 0  0  Trouble concentrating 1 0 0  0  Moving slowly or fidgety/restless 0 0 0  0  Suicidal thoughts 0 0 0  0  PHQ-9 Score 7 2 7   0  Difficult doing work/chores Somewhat difficult Somewhat difficult Somewhat difficult      phq 9 is positive  Fall Risk:    11/21/2023    8:58 AM 03/14/2023    8:47 AM 12/08/2022    3:19 PM 09/14/2022     8:50 AM 05/23/2022    8:43 AM  Fall Risk   Falls in the past year? 0 1 0 1 1  Number falls in past yr: 0 0 0 1 0  Injury with Fall? 0 0 0 0 0  Risk for fall due to : No Fall Risks History of fall(s) No Fall Risks No Fall Risks History of fall(s)  Follow up Falls evaluation completed Falls prevention discussed;Education provided;Falls evaluation completed Education provided;Falls prevention discussed Falls prevention discussed Falls prevention discussed;Education provided;Falls evaluation completed      Data saved with a previous flowsheet row definition      Assessment & Plan Obesity, BMI 38 Morbid obesity BMI over 35 with comorbidities including hypertension, prediabetes, and chronic kidney disease stage 3A. Weight increased from 199 lbs to 207 lbs. Discussed emotional health impact and potential benefits of weight loss medications. Medicare does not cover weight loss medications, but a voucher for Contrave  is available to reduce cost to $100 per month. - Prescribe Contrave  with titration: 1 tablet daily for 7 days, then 2 tablets daily, then 2 in the morning and 1 at night, then 2 twice daily. - Monitor blood pressure due to potential increase from Contrave . - Follow up in 4-5 weeks to assess weight loss progress and blood pressure. - Advise to eliminate sweet beverages and focus on protein intake.  Hypertension Hypertension managed with tribenazole and Coreg. Blood pressure today was 130/74, borderline but typically lower. Monitoring is necessary, especially with the introduction of Contrave , which may elevate blood pressure. - Continue tribenazole and Coreg as prescribed. - Monitor blood pressure, especially with the initiation of Contrave .  Chronic kidney disease stage 3A Chronic kidney disease stage 3A with GFR of 53. No symptoms of decreased kidney function. - Monitor kidney function and blood pressure. - Ensure blood pressure remains controlled to prevent further kidney  damage.  Prediabetes Prediabetes managed with metformin  500 mg twice daily. No reported side effects from metformin . Discussed dietary modifications to help manage blood sugar levels, including the use of apple cider vinegar before high-carb meals. - Continue metformin  500 mg  twice daily. - Advise on dietary modifications, including the use of organic apple cider vinegar mixed with water before high-carb meals.  Post-viral cough syndrome Persistent dry cough following a viral upper respiratory infection. Likely post-viral bronchial cough. Lungs are clear, indicating no current infection. Discussed potential treatment with Tessalon  Perles and Trelegy to alleviate symptoms. - Prescribe Tessalon  Perles for cough suppression, up to three times a day. - Provide a sample of Trelegy for two weeks to calm the lungs. - Advise to report back if no improvement in two weeks for potential chest x-ray.  Depression Depression potentially exacerbated by weight gain and family stress. Managed with Celexa . Discussed the impact of family dynamics on emotional health and the importance of setting boundaries. Encouraged therapy for additional support. - Continue Celexa  as prescribed. - Encourage setting boundaries in family dynamics. - Recommend therapy for additional emotional support.  Onychauxis (thickened toenails) Thickened toenails likely due to trauma rather than fungal infection. Previous podiatrist consultation suggested no infection. - Consider nail buffering to reduce thickness.

## 2023-11-21 NOTE — Patient Instructions (Signed)
 Organic apple cider vinegar from local grocery store  Mix one tbsp in 8 oz of water and drink 30 minutes to one hour before meals

## 2023-12-15 ENCOUNTER — Other Ambulatory Visit: Payer: Self-pay | Admitting: Family Medicine

## 2023-12-15 DIAGNOSIS — I10 Essential (primary) hypertension: Secondary | ICD-10-CM

## 2024-01-02 ENCOUNTER — Encounter: Payer: Self-pay | Admitting: Family Medicine

## 2024-01-02 ENCOUNTER — Ambulatory Visit: Admitting: Family Medicine

## 2024-01-02 DIAGNOSIS — K219 Gastro-esophageal reflux disease without esophagitis: Secondary | ICD-10-CM

## 2024-01-02 MED ORDER — OMEPRAZOLE 40 MG PO CPDR: 40.0000 mg | DELAYED_RELEASE_CAPSULE | Freq: Every day | ORAL | 0 refills | Status: DC

## 2024-01-02 MED ORDER — CONTRAVE 8-90 MG PO TB12: 2.0000 | ORAL_TABLET | Freq: Two times a day (BID) | ORAL | 1 refills | Status: DC

## 2024-01-02 NOTE — Progress Notes (Signed)
 Name: Monique Baker   MRN: [id number]    DOB: May 07, 1950   Date:01/02/2024       Progress Note  Subjective  Chief Complaint  Chief Complaint  Patient presents with   Medical Management of Chronic Issues    Follow up on Contrave    Discussed the use of AI scribe software for clinical note transcription with the patient, who gave verbal consent to proceed.  History of Present Illness Monique Baker is a 73 year old female who presents for follow-up on weight management and medication adherence.  She has a long-standing history of obesity and has attempted various diets, including Weight Watchers, but struggles with consistent adherence. She recently started Contrave  for weight loss, taking two pills in the morning and one at night, but has not reached the full dose of two pills twice daily. Her weight has slightly decreased from 208 to 207.9 pounds. No side effects from Contrave  are reported, and the morning dose slightly curbs her appetite. However, she often forgets the evening dose, which is recommended to curb her appetite for her largest meal. Frequent travel disrupts her routine, contributing to inconsistent medication adherence and dietary choices.  She experienced a choking incident while eating fish, during which she was temporarily unable to breathe. Her husband attempted the Heimlich maneuver, but the obstruction resolved spontaneously. No subsequent swallowing difficulties, fever, or chills have occurred.  She has a persistent cough that worsens at night when lying down, leading to vomiting about a week ago. No heartburn or indigestion is reported, and she is not on any reflux medication. The cough and vomiting preceded the choking incident.    Patient Active Problem List   Diagnosis Date Noted   B12 deficiency 11/21/2023   Stage 3a chronic kidney disease (HCC) 11/21/2023   Osteoarthritis of hip 02/19/2018   Osteopenia of hip 09/04/2016   History of bunionectomy of left  great toe 07/15/2015   H/O hammer toe correction 03/15/2015   Arthritis of lumbar spine 01/19/2015   Benign essential HTN 11/05/2014   Carpal tunnel syndrome 11/05/2014   Osteoarthritis 11/05/2014   Dermatitis, eczematoid 11/05/2014   Depression, major, recurrent, mild (HCC) 11/05/2014   Dyslipidemia 11/05/2014   Gastro-esophageal reflux disease without esophagitis 11/05/2014   Morbid obesity (HCC) 11/05/2014   Dysmetabolic syndrome 11/05/2014   Menopausal and perimenopausal disorder 11/05/2014   Perennial allergic rhinitis with seasonal variation 11/05/2014   Seborrhea capitis 11/05/2014   Acquired trigger finger 11/05/2014   Urinary incontinence in female 11/05/2014   Vitamin D  deficiency 11/05/2014   Blood glucose elevated 08/31/2009    Past Surgical History:  Procedure Laterality Date   ABDOMINAL HYSTERECTOMY     CATARACT EXTRACTION, BILATERAL     CESAREAN SECTION     COLONOSCOPY WITH PROPOFOL  N/A 03/15/2016   Procedure: COLONOSCOPY WITH PROPOFOL ;  Surgeon: Louanne KANDICE Muse, MD;  Location: [facility];  Service: Endoscopy;  Laterality: N/A;   HALLUX VALGUS LAPIDUS Left 06/16/2015   Procedure: HALLUX VALGUS LAPIDUS;  Surgeon: Eva Gay, DPM;  Location: [facility];  Service: Podiatry;  Laterality: Left;  WITH POPLITEAL   HAMMER TOE SURGERY Left 06/16/2015   Procedure: HAMMER TOE CORRECTION SECOND TOE LEFT;  Surgeon: Eva Gay, DPM;  Location: [facility];  Service: Podiatry;  Laterality: Left;  prediabetic - on oral meds   hip replacemanet Right 03/15/2018   [facility]   MYOMECTOMY     OOPHORECTOMY     trigger finger Right 12/15/2014   Vulva cyst  11/2006    Family History  Problem Relation Age of Onset   Dementia Mother    Diabetes Father    Stroke Father    Cancer Daughter        Fibroid Tumors   Diabetes Brother    Breast cancer Neg Hx     Social History   Tobacco Use   Smoking status: Never   Smokeless tobacco:  Never   Tobacco comments:    smoking cessation materials not required  Substance Use Topics   Alcohol use: No    Alcohol/week: 0.0 standard drinks of alcohol     Current Outpatient Medications:    acetaminophen  (TYLENOL ) 500 MG tablet, Take by mouth. Reported on 11/15/2015, Disp: , Rfl:    aspirin EC 81 MG tablet, Take 81 mg by mouth daily., Disp: , Rfl:    atorvastatin  (LIPITOR) 40 MG tablet, Take 1 tablet (40 mg total) by mouth every evening. for cholesterol, Disp: 90 tablet, Rfl: 1   Azelastine  HCl 137 MCG/SPRAY SOLN, PLACE 2 SPRAYS INTO BOTH NOSTRILS 2 (TWO) TIMES DAILY. USE IN EACH NOSTRIL AS DIRECTED, Disp: 90 mL, Rfl: 0   benzonatate  (TESSALON ) 100 MG capsule, Take 1 capsule (100 mg total) by mouth 3 (three) times daily as needed for cough., Disp: 40 capsule, Rfl: 0   Cholecalciferol (VITAMIN D ) 50 MCG (2000 UT) CAPS, Take 1 capsule (2,000 Units total) by mouth daily., Disp: 30 capsule, Rfl: 0   citalopram  (CELEXA ) 20 MG tablet, TAKE 1 TABLET BY MOUTH EVERY DAY, Disp: 90 tablet, Rfl: 1   Cyanocobalamin  (B-12) 1000 MCG SUBL, Place 1 each under the tongue 2 (two) times a week. , Disp: , Rfl:    Fluticasone -Umeclidin-Vilant (TRELEGY ELLIPTA ) 100-62.5-25 MCG/ACT AEPB, Inhale 1 puff into the lungs daily., Disp: , Rfl:    glucose blood (ONETOUCH VERIO) test strip, Use as instructed, Disp: 100 each, Rfl: 12   loratadine  (CLARITIN ) 10 MG tablet, Take 1 tablet (10 mg total) by mouth daily., Disp: 90 tablet, Rfl: 1   metFORMIN  (GLUCOPHAGE ) 500 MG tablet, Take 1 tablet (500 mg total) by mouth 2 (two) times daily., Disp: 180 tablet, Rfl: 1   montelukast  (SINGULAIR ) 10 MG tablet, Take 1 tablet (10 mg total) by mouth at bedtime., Disp: 90 tablet, Rfl: 1   nadolol  (CORGARD ) 40 MG tablet, Take 1 tablet (40 mg total) by mouth daily., Disp: 90 tablet, Rfl: 1   Naltrexone-buPROPion  HCl ER (CONTRAVE ) 8-90 MG TB12, Start 1 tablet every morning for 7 days, then 1 tablet twice daily for 7 days, then 2 tablets  every morning and one every evening, Disp: 120 tablet, Rfl: 0   Olmesartan -amLODIPine -HCTZ 40-5-12.5 MG TABS, Take 1 tablet by mouth daily., Disp: 90 tablet, Rfl: 1   triamcinolone  cream (KENALOG ) 0.1 %, Apply topically 2 (two) times daily., Disp: 45 g, Rfl: 0   Turmeric Curcumin 08-998 MG CAPS, Take 1,000 mg by mouth., Disp: , Rfl:   Allergies  Allergen Reactions   Duloxetine      sweat    I personally reviewed active problem list, medication list, allergies with the patient/caregiver today.   ROS  Ten systems reviewed and is negative except as mentioned in HPI    Objective Physical Exam  CONSTITUTIONAL: Patient appears well-developed and well-nourished.  No distress. HEENT: Head atraumatic, normocephalic, neck supple. CARDIOVASCULAR: Normal rate, regular rhythm and normal heart sounds.  No murmur heard. No BLE edema. PULMONARY: Effort normal and breath sounds normal. No respiratory distress. ABDOMINAL: There is no  tenderness or distention. MUSCULOSKELETAL: Normal gait. Without gross motor or sensory deficit. PSYCHIATRIC: Patient has a normal mood and affect. behavior is normal. Judgment and thought content normal.  Vitals:   01/02/24 1041  BP: 134/74  Pulse: 66  Resp: 16  SpO2: 99%  Weight: 207 lb 14.4 oz (94.3 kg)  Height: 5' 2 (1.575 m)    Body mass index is 38.03 kg/m.   PHQ2/9:    01/02/2024   10:39 AM 11/21/2023    9:06 AM 07/17/2023    9:48 AM 03/14/2023    8:48 AM 12/08/2022    3:26 PM  Depression screen PHQ 2/9  Decreased Interest 0 1 1 1  0  Down, Depressed, Hopeless 0 1 1 2  0  PHQ - 2 Score 0 2 2 3  0  Altered sleeping 0 1 0 0   Tired, decreased energy 0 1 0 2   Change in appetite 0 1 0 2   Feeling bad or failure about yourself  0 1 0 0   Trouble concentrating 0 1 0 0   Moving slowly or fidgety/restless 0 0 0 0   Suicidal thoughts 0 0 0 0   PHQ-9 Score 0 7 2 7    Difficult doing work/chores Not difficult at all Somewhat difficult Somewhat difficult  Somewhat difficult     phq 9 is negative  Fall Risk:    01/02/2024   10:35 AM 11/21/2023    8:58 AM 03/14/2023    8:47 AM 12/08/2022    3:19 PM 09/14/2022    8:50 AM  Fall Risk   Falls in the past year? 0 0 1 0 1  Number falls in past yr: 0 0 0 0 1  Injury with Fall? 0 0 0 0 0  Risk for fall due to : No Fall Risks No Fall Risks History of fall(s) No Fall Risks No Fall Risks  Follow up Falls evaluation completed Falls evaluation completed Falls prevention discussed;Education provided;Falls evaluation completed Education provided;Falls prevention discussed Falls prevention discussed     Assessment & Plan Morbid obesity due to excess calories Weight stable at 207.9 lbs on Contrave . No side effects. Emphasized full dose adherence for appetite control. Insurance requires 5% weight loss for coverage. - Instruct to take Contrave  two pills in the morning and two before dinner. - Set reminders for evening dose of Contrave . - Encourage adherence to Weight Watchers program. - Discuss portion control and healthier eating options. - Provide prescription for Contrave  with one refill for two months.  Gastroesophageal reflux disease Suspect cough related to GERD. No heartburn or indigestion. Plan to treat with medication and consider pulmonologist referral if no improvement. - Prescribe omeprazole  for reflux symptoms. - Advise against eating before bedtime and avoiding spicy foods. - Schedule follow-up in two months to assess weight and reflux symptoms.

## 2024-01-05 ENCOUNTER — Other Ambulatory Visit: Payer: Self-pay | Admitting: Family Medicine

## 2024-01-05 DIAGNOSIS — J302 Other seasonal allergic rhinitis: Secondary | ICD-10-CM

## 2024-01-05 DIAGNOSIS — R058 Other specified cough: Secondary | ICD-10-CM

## 2024-02-29 DIAGNOSIS — H35372 Puckering of macula, left eye: Secondary | ICD-10-CM | POA: Diagnosis not present

## 2024-02-29 DIAGNOSIS — Z961 Presence of intraocular lens: Secondary | ICD-10-CM | POA: Diagnosis not present

## 2024-02-29 DIAGNOSIS — E119 Type 2 diabetes mellitus without complications: Secondary | ICD-10-CM | POA: Diagnosis not present

## 2024-02-29 DIAGNOSIS — H26493 Other secondary cataract, bilateral: Secondary | ICD-10-CM | POA: Diagnosis not present

## 2024-02-29 LAB — OPHTHALMOLOGY REPORT-SCANNED

## 2024-03-05 ENCOUNTER — Encounter: Payer: Self-pay | Admitting: Family Medicine

## 2024-03-05 ENCOUNTER — Ambulatory Visit: Admitting: Family Medicine

## 2024-03-05 DIAGNOSIS — I10 Essential (primary) hypertension: Secondary | ICD-10-CM | POA: Diagnosis not present

## 2024-03-05 DIAGNOSIS — Z23 Encounter for immunization: Secondary | ICD-10-CM | POA: Diagnosis not present

## 2024-03-05 MED ORDER — PHENTERMINE-TOPIRAMATE ER 3.75-23 MG PO CP24: 1.0000 | ORAL_CAPSULE | ORAL | 0 refills | Status: DC

## 2024-03-05 NOTE — Progress Notes (Signed)
 Name: Monetta Lick   MRN: [id number]    DOB: [birth year]   Date:03/05/2024       Progress Note  Subjective  Chief Complaint  Chief Complaint  Patient presents with   Medical Management of Chronic Issues    Contrave - was not consistent, forget to take it   Discussed the use of AI scribe software for clinical note transcription with the patient, who gave verbal consent to proceed.  History of Present Illness Jennel Mara is a 73 year old female with obesity, dyslipidemia, and hypertension who presents for a follow-up on weight management.  She has struggled with weight loss despite trying various diets, including Weight Watchers and low-calorie diets, and increasing her physical activity. She started taking Contrave  in July but has had difficulty taking it consistently, particularly the evening dose, which has led to nighttime eating. The morning dose curbs her appetite during the day. She has not experienced any side effects from Contrave  but finds it expensive and ineffective due to inconsistent use.  Her weight was 207 pounds with a BMI of 38.03 at her previous visit. She has a history of dyslipidemia and hypertension, which could improve with weight loss. She is currently taking amlodipine , olmesartan , and HCTZ (40/12.5 mg) for her blood pressure, which was recorded at 150 mmHg previously. She monitors her blood pressure regularly and notes it is 'good today'.  In terms of lifestyle, she is 'always on the go' and sometimes forgets to take her medication. She is trying to be more strict with her diet, especially with the upcoming holidays, to avoid gaining weight. She has reduced her intake of sweet beverages but has not completely eliminated them. She drinks Coke Zero occasionally and is working on cutting out sweet tea and other sugary drinks entirely. She acknowledges that sweet beverages contribute significantly to her caloric intake and is attempting to switch to water or  unsweetened drinks.  She is motivated to lose weight to improve her health and fit better in her clothes.    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   Benign essential HTN 11/05/2014   Carpal tunnel syndrome 11/05/2014   Osteoarthritis 11/05/2014   Dermatitis, eczematoid 11/05/2014   Depression, major, recurrent, mild (HCC) 11/05/2014   Dyslipidemia 11/05/2014   Gastro-esophageal reflux disease without esophagitis 11/05/2014   Morbid obesity (HCC) 11/05/2014   Dysmetabolic syndrome 11/05/2014   Menopausal and perimenopausal disorder 11/05/2014   Perennial allergic rhinitis with seasonal variation 11/05/2014   Seborrhea capitis 11/05/2014   Acquired trigger finger 11/05/2014   Urinary incontinence in female 11/05/2014   Vitamin D  deficiency 11/05/2014   Blood glucose elevated 08/31/2009    Past Surgical History:  Procedure Laterality Date   ABDOMINAL HYSTERECTOMY     CATARACT EXTRACTION, BILATERAL     CESAREAN SECTION     COLONOSCOPY WITH PROPOFOL  N/A 03/15/2016   Procedure: COLONOSCOPY WITH PROPOFOL ;  Surgeon: Louanne KANDICE Muse, MD;  Location: [facility];  Service: Endoscopy;  Laterality: N/A;   HALLUX VALGUS LAPIDUS Left 06/16/2015   Procedure: HALLUX VALGUS LAPIDUS;  Surgeon: Eva Gay, DPM;  Location: [facility];  Service: Podiatry;  Laterality: Left;  WITH POPLITEAL   HAMMER TOE  SURGERY Left 06/16/2015   Procedure: HAMMER TOE CORRECTION SECOND TOE LEFT;  Surgeon: Eva Gay, DPM;  Location: [facility];  Service: Podiatry;  Laterality: Left;  prediabetic - on oral meds   hip replacemanet Right 03/15/2018   [facility] Bowers-Emergeortho   MYOMECTOMY      OOPHORECTOMY     trigger finger Right 12/15/2014   Vulva cyst  11/2006    Family History  Problem Relation Age of Onset   Dementia Mother    Diabetes Father    Stroke Father    Cancer Daughter        Fibroid Tumors   Diabetes Brother    Breast cancer Neg Hx     Social History   Tobacco Use   Smoking status: Never   Smokeless tobacco: Never   Tobacco comments:    smoking cessation materials not required  Substance Use Topics   Alcohol use: No    Alcohol/week: 0.0 standard drinks of alcohol     Current Outpatient Medications:    acetaminophen  (TYLENOL ) 500 MG tablet, Take by mouth. Reported on 11/15/2015, Disp: , Rfl:    aspirin EC 81 MG tablet, Take 81 mg by mouth daily., Disp: , Rfl:    atorvastatin  (LIPITOR) 40 MG tablet, Take 1 tablet (40 mg total) by mouth every evening. for cholesterol, Disp: 90 tablet, Rfl: 1   Azelastine  HCl 137 MCG/SPRAY SOLN, PLACE 2 SPRAYS INTO BOTH NOSTRILS 2 (TWO) TIMES DAILY. USE IN EACH NOSTRIL AS DIRECTED, Disp: 90 mL, Rfl: 0   Cholecalciferol (VITAMIN D ) 50 MCG (2000 UT) CAPS, Take 1 capsule (2,000 Units total) by mouth daily., Disp: 30 capsule, Rfl: 0   citalopram  (CELEXA ) 20 MG tablet, TAKE 1 TABLET BY MOUTH EVERY DAY, Disp: 90 tablet, Rfl: 1   Cyanocobalamin  (B-12) 1000 MCG SUBL, Place 1 each under the tongue 2 (two) times a week. , Disp: , Rfl:    Fluticasone -Umeclidin-Vilant (TRELEGY ELLIPTA ) 100-62.5-25 MCG/ACT AEPB, Inhale 1 puff into the lungs daily., Disp: , Rfl:    glucose blood (ONETOUCH VERIO) test strip, Use as instructed, Disp: 100 each, Rfl: 12   loratadine  (CLARITIN ) 10 MG tablet, Take 1 tablet (10 mg total) by mouth daily., Disp: 90 tablet, Rfl: 1   metFORMIN  (GLUCOPHAGE ) 500 MG tablet, Take 1 tablet (500 mg total) by mouth 2 (two) times daily., Disp: 180 tablet, Rfl: 1   montelukast  (SINGULAIR ) 10 MG tablet, TAKE 1 TABLET BY MOUTH EVERYDAY AT BEDTIME, Disp: 90 tablet, Rfl: 0   nadolol  (CORGARD ) 40 MG tablet, Take 1 tablet (40  mg total) by mouth daily., Disp: 90 tablet, Rfl: 1   Naltrexone-buPROPion  HCl ER (CONTRAVE ) 8-90 MG TB12, Take 2 tablets by mouth 2 (two) times daily., Disp: 120 tablet, Rfl: 1   Olmesartan -amLODIPine -HCTZ 40-5-12.5 MG TABS, Take 1 tablet by mouth daily., Disp: 90 tablet, Rfl: 1   omeprazole  (PRILOSEC) 40 MG capsule, Take 1 capsule (40 mg total) by mouth daily., Disp: 90 capsule, Rfl: 0   triamcinolone  cream (KENALOG ) 0.1 %, Apply topically 2 (two) times daily., Disp: 45 g, Rfl: 0   Turmeric Curcumin 08-998 MG CAPS, Take 1,000 mg by mouth., Disp: , Rfl:   Allergies  Allergen Reactions   Duloxetine      sweat    I personally reviewed active problem list, medication list, allergies with the patient/caregiver today.   ROS  Ten systems reviewed and is negative except as mentioned in HPI    Objective Physical Exam VITALS: BP- 130/ MEASUREMENTS: Weight- 207,  BMI- 38.03. CONSTITUTIONAL: Patient appears well-developed and well-nourished.  No distress. HEENT: Head atraumatic, normocephalic, neck supple. CARDIOVASCULAR: Normal rate, regular rhythm and normal heart sounds.  No murmur heard. No BLE edema. PULMONARY: Effort normal and breath sounds normal. No respiratory distress. ABDOMINAL: There is no tenderness or distention. MUSCULOSKELETAL: Normal gait. Without gross motor or sensory deficit. PSYCHIATRIC: Patient has a normal mood and affect. behavior is normal. Judgment and thought content normal.  Vitals:   03/05/24 0948  BP: 130/80  Pulse: 70  Resp: 16  SpO2: 99%  Weight: 206 lb (93.4 kg)  Height: 5' 2 (1.575 m)    Body mass index is 37.68 kg/m.   PHQ2/9:    03/05/2024    9:43 AM 01/02/2024   10:39 AM 11/21/2023    9:06 AM 07/17/2023    9:48 AM 03/14/2023    8:48 AM  Depression screen PHQ 2/9  Decreased Interest 0 0 1 1 1   Down, Depressed, Hopeless 0 0 1 1 2   PHQ - 2 Score 0 0 2 2 3   Altered sleeping  0 1 0 0  Tired, decreased energy  0 1 0 2  Change in appetite  0 1  0 2  Feeling bad or failure about yourself   0 1 0 0  Trouble concentrating  0 1 0 0  Moving slowly or fidgety/restless  0 0 0 0  Suicidal thoughts  0 0 0 0  PHQ-9 Score  0 7 2 7   Difficult doing work/chores  Not difficult at all Somewhat difficult Somewhat difficult Somewhat difficult    phq 9 is negative  Fall Risk:    03/05/2024    9:43 AM 01/02/2024   10:35 AM 11/21/2023    8:58 AM 03/14/2023    8:47 AM 12/08/2022    3:19 PM  Fall Risk   Falls in the past year? 0 0 0 1 0  Number falls in past yr: 0 0 0 0 0  Injury with Fall? 0 0 0 0 0  Risk for fall due to : No Fall Risks No Fall Risks No Fall Risks History of fall(s) No Fall Risks  Follow up Falls evaluation completed Falls evaluation completed Falls evaluation completed Falls prevention discussed;Education provided;Falls evaluation completed Education provided;Falls prevention discussed     Assessment & Plan Morbid obesity due to excess calories Morbid obesity with BMI 38.03. Previous Contrave  use inconsistent. Qsymia considered as alternative. Discussed side effects: increased heart rate, blood pressure, tingling. Emphasized lifestyle changes, reducing sweet beverages, adherence to Weight Watchers. - Prescribed Qsymia, low dose, once daily. - Advised monitoring for side effects: increased heart rate, blood pressure, tingling, palpitations, shortness of breath. - Encouraged adherence to Weight Watchers, dietary modifications, eliminate sweet beverages. - Sent prescription to pharmacy, brand or generic based on cost. - Scheduled follow-up to assess medication efficacy, adjust as needed.  Essential hypertension Hypertension managed with amlodipine , olmesartan , HCTZ. Blood pressure 130/70. Discussed Qsymia's potential to increase blood pressure, heart rate, requiring monitoring. - Continue amlodipine , olmesartan , HCTZ. - Monitor blood pressure regularly, especially with Qsymia initiation. - Adjust antihypertensive medications if  blood pressure increases significantly.

## 2024-04-03 ENCOUNTER — Other Ambulatory Visit: Payer: Self-pay | Admitting: Family Medicine

## 2024-04-03 DIAGNOSIS — J302 Other seasonal allergic rhinitis: Secondary | ICD-10-CM

## 2024-04-03 DIAGNOSIS — R058 Other specified cough: Secondary | ICD-10-CM

## 2024-04-04 ENCOUNTER — Other Ambulatory Visit: Payer: Self-pay | Admitting: Family Medicine

## 2024-04-04 DIAGNOSIS — K219 Gastro-esophageal reflux disease without esophagitis: Secondary | ICD-10-CM

## 2024-04-05 NOTE — Telephone Encounter (Signed)
 Requested Prescriptions  Pending Prescriptions Disp Refills   montelukast  (SINGULAIR ) 10 MG tablet [Pharmacy Med Name: MONTELUKAST  SOD 10 MG TABLET] 90 tablet 2    Sig: TAKE 1 TABLET BY MOUTH EVERYDAY AT BEDTIME     Pulmonology:  Leukotriene Inhibitors Passed - 04/05/2024  6:36 PM      Passed - Valid encounter within last 12 months    Recent Outpatient Visits           1 month ago Morbid obesity [facility])   [place] [facility] Glenard Mire, MD   3 months ago Morbid obesity [facility])   [place] [facility] Glenard Mire, MD   4 months ago Morbid obesity [facility])   [place] [facility] Glenard Mire, MD   8 months ago Stage 3a chronic kidney disease [facility])   [facility] Sowles, Krichna, MD

## 2024-04-07 NOTE — Telephone Encounter (Signed)
 Requested Prescriptions  Pending Prescriptions Disp Refills   omeprazole  (PRILOSEC) 40 MG capsule [Pharmacy Med Name: OMEPRAZOLE  DR 40 MG CAPSULE] 90 capsule 3    Sig: TAKE 1 CAPSULE (40 MG TOTAL) BY MOUTH DAILY.     Gastroenterology: Proton Pump Inhibitors Passed - 04/07/2024  1:50 PM      Passed - Valid encounter within last 12 months    Recent Outpatient Visits           1 month ago Morbid obesity [facility])   [place] [facility] Glenard Mire, MD   3 months ago Morbid obesity [facility])   [place] [facility] Glenard Mire, MD   4 months ago Morbid obesity [facility])   [place] Glenard Mire, MD   8 months ago Stage 3a chronic kidney disease [facility])   [facility] Sowles, Krichna, MD

## 2024-04-11 ENCOUNTER — Telehealth: Payer: Self-pay

## 2024-04-11 NOTE — Progress Notes (Signed)
 Pharmacy Quality Measure Review  This patient is appearing on a report for being at risk of failing the adherence measure for diabetes medications this calendar year.   Medication: metformin  Last fill date: 03/18/24 for 90 day supply  Insurance report was not up to date. No action needed at this time.   Shikara Mcauliffe E. Marsh, PharmD, CPP Clinical Pharmacist [facility] [phone]

## 2024-04-18 NOTE — Progress Notes (Signed)
 Monique Baker                                          MRN: [id number]   04/18/2024   The [facility] Quality Team Specialist reviewed this patient medical record for the purposes of chart review for care gap closure. The following were reviewed: chart review for care gap closure-glycemic status assessment and kidney health evaluation for diabetes:eGFR  and uACR.    [facility] Quality Team

## 2024-04-29 ENCOUNTER — Encounter: Payer: Self-pay | Admitting: Family Medicine

## 2024-04-29 ENCOUNTER — Ambulatory Visit: Admitting: Family Medicine

## 2024-04-29 VITALS — BP 130/76 | HR 64 | Resp 16 | Ht 62.0 in | Wt 209.9 lb

## 2024-04-29 DIAGNOSIS — E785 Hyperlipidemia, unspecified: Secondary | ICD-10-CM | POA: Diagnosis not present

## 2024-04-29 DIAGNOSIS — R739 Hyperglycemia, unspecified: Secondary | ICD-10-CM

## 2024-04-29 DIAGNOSIS — E8881 Metabolic syndrome: Secondary | ICD-10-CM | POA: Diagnosis not present

## 2024-04-29 DIAGNOSIS — I129 Hypertensive chronic kidney disease with stage 1 through stage 4 chronic kidney disease, or unspecified chronic kidney disease: Secondary | ICD-10-CM | POA: Diagnosis not present

## 2024-04-29 DIAGNOSIS — N183 Chronic kidney disease, stage 3 unspecified: Secondary | ICD-10-CM

## 2024-04-29 DIAGNOSIS — J3089 Other allergic rhinitis: Secondary | ICD-10-CM

## 2024-04-29 DIAGNOSIS — E538 Deficiency of other specified B group vitamins: Secondary | ICD-10-CM

## 2024-04-29 DIAGNOSIS — F330 Major depressive disorder, recurrent, mild: Secondary | ICD-10-CM

## 2024-04-29 DIAGNOSIS — J302 Other seasonal allergic rhinitis: Secondary | ICD-10-CM | POA: Diagnosis not present

## 2024-04-29 MED ORDER — CITALOPRAM HYDROBROMIDE 20 MG PO TABS: 20.0000 mg | ORAL_TABLET | Freq: Every day | ORAL | 1 refills | Status: AC

## 2024-04-29 MED ORDER — ATORVASTATIN CALCIUM 40 MG PO TABS: 40.0000 mg | ORAL_TABLET | Freq: Every evening | ORAL | 1 refills | Status: AC

## 2024-04-29 MED ORDER — NADOLOL 40 MG PO TABS: 40.0000 mg | ORAL_TABLET | Freq: Every evening | ORAL | 1 refills | Status: AC

## 2024-04-29 MED ORDER — OLMESARTAN-AMLODIPINE-HCTZ 40-5-25 MG PO TABS: 1.0000 | ORAL_TABLET | Freq: Every day | ORAL | 1 refills | Status: AC

## 2024-04-29 MED ORDER — METFORMIN HCL 500 MG PO TABS: 500.0000 mg | ORAL_TABLET | Freq: Two times a day (BID) | ORAL | 1 refills | Status: AC

## 2024-04-29 MED ORDER — LORATADINE 10 MG PO TABS: 10.0000 mg | ORAL_TABLET | Freq: Every day | ORAL | 1 refills | Status: AC

## 2024-04-29 NOTE — Progress Notes (Signed)
 Name: Monique Baker   MRN: [id number]    DOB: 28-Feb-1951   Date:04/29/2024       Progress Note  Subjective  Chief Complaint  Chief Complaint  Patient presents with   Medical Management of Chronic Issues   Discussed the use of AI scribe software for clinical note transcription with the patient, who gave verbal consent to proceed.  History of Present Illness Monique Baker is a 73 year old female with depression and hypertension who presents for a follow-up visit and worsening depression.  She has been experiencing worsening depression, which she attributes to the holiday season and feeling overwhelmed by responsibilities at church and home. She feels unsupported by her husband, who is a education officer, environmental, and believes her needs are not being met. She has not been on citalopram  since November, which she believes was helping her depression before she ran out.  She reports not getting enough sleep, often going to bed late and waking up early, especially during the holiday season. She notes that when she sleeps from midnight to around 9:00 or 9:30 AM, she feels rested, but this is not always possible.  She has high blood pressure and chronic kidney disease. Her blood pressure readings have been elevated, with recent logs showing values as high as 157/79. She experiences mild swelling in both legs, more pronounced on the right. She is currently on amlodipine /olmasartan/hydrochlorothiazide combo  and nadolol  for blood pressure management.  She has a history of prediabetes and is taking metformin  500 mg twice daily. She is also concerned about her weight, noting recent weight gain despite attending Weight Watchers and participating in line dancing twice a week. She was given rx of Qsymia  on her last visit but never got it field   She has a history of allergies for which she takes loratadine  and montelukast . She also uses a nasal spray as needed. No current symptoms of heartburn or indigestion      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   Benign essential HTN 11/05/2014   Carpal tunnel syndrome 11/05/2014   Osteoarthritis 11/05/2014   Dermatitis, eczematoid 11/05/2014   Depression, major, recurrent, mild (HCC) 11/05/2014   Dyslipidemia 11/05/2014   Gastro-esophageal reflux disease without esophagitis 11/05/2014   Morbid obesity (HCC) 11/05/2014   Dysmetabolic syndrome 11/05/2014   Menopausal and perimenopausal disorder 11/05/2014   Perennial allergic rhinitis with seasonal variation 11/05/2014   Seborrhea capitis 11/05/2014   Acquired trigger finger 11/05/2014   Urinary incontinence in female 11/05/2014   Vitamin D  deficiency 11/05/2014   Blood glucose elevated 08/31/2009    Past Surgical History:  Procedure Laterality Date   ABDOMINAL HYSTERECTOMY     CATARACT EXTRACTION, BILATERAL     CESAREAN SECTION     COLONOSCOPY WITH PROPOFOL  N/A 03/15/2016   Procedure: COLONOSCOPY WITH PROPOFOL ;  Surgeon: Louanne KANDICE Muse, MD;  Location: [facility];  Service: Endoscopy;  Laterality: N/A;   HALLUX VALGUS LAPIDUS Left 06/16/2015   Procedure: HALLUX VALGUS LAPIDUS;  Surgeon: Eva Gay, DPM;  Location: [facility];  Service: Podiatry;  Laterality: Left;  WITH POPLITEAL   HAMMER TOE SURGERY Left 06/16/2015   Procedure: HAMMER TOE CORRECTION SECOND TOE LEFT;  Surgeon:  Eva Gay, DPM;  Location[facility];  Service: Podiatry;  Laterality: Left;  prediabetic - on oral meds   hip replacemanet Right 03/15/2018   [facility] Bowers-Emergeortho   MYOMECTOMY     OOPHORECTOMY     trigger finger Right 12/15/2014   Vulva cyst  11/2006    Family History  Problem Relation Age  of Onset   Dementia Mother    Diabetes Father    Stroke Father    Cancer Daughter        Fibroid Tumors   Diabetes Brother    Breast cancer Neg Hx     Social History   Tobacco Use   Smoking status: Never   Smokeless tobacco: Never   Tobacco comments:    smoking cessation materials not required  Substance Use Topics   Alcohol use: No    Alcohol/week: 0.0 standard drinks of alcohol    Current Medications[1]  Allergies[2]  I personally reviewed active problem list, medication list, allergies, family history with the patient/caregiver today.   ROS  Ten systems reviewed and is negative except as mentioned in HPI    Objective Physical Exam  CONSTITUTIONAL: Patient appears well-developed and well-nourished. No distress. HEENT: Head atraumatic, normocephalic, neck supple. CARDIOVASCULAR: Normal rate, regular rhythm and normal heart sounds. No murmur heard. Mild swelling in both legs, right greater than left. Trace to 1+ pitting  PULMONARY: Effort normal and breath sounds normal. No respiratory distress. ABDOMINAL: There is no tenderness or distention. MUSCULOSKELETAL: Normal gait. Without gross motor or sensory deficit. PSYCHIATRIC: Patient has a normal mood and affect. Behavior is normal. Judgment and thought content normal.  Vitals:   04/29/24 0937  BP: 130/76  Pulse: 64  Resp: 16  SpO2: 98%  Weight: 209 lb 14.4 oz (95.2 kg)  Height: 5' 2 (1.575 m)    Body mass index is 38.39 kg/m.  Recent Results (from the past 2160 hours)  OPHTHALMOLOGY REPORT-SCANNED     Status: None   Collection Time: 02/29/24 10:56 AM  Result Value Ref Range   HM Diabetic Eye Exam No Retinopathy No Retinopathy    Comment: abst by him   A Comment        PHQ2/9:    04/29/2024    9:30 AM 03/05/2024    9:43 AM 01/02/2024   10:39 AM 11/21/2023    9:06 AM 07/17/2023    9:48 AM  Depression screen PHQ 2/9  Decreased Interest 2 0 0 1 1  Down, Depressed, Hopeless 2 0 0 1 1  PHQ - 2  Score 4 0 0 2 2  Altered sleeping 0  0 1 0  Tired, decreased energy 2  0 1 0  Change in appetite 0  0 1 0  Feeling bad or failure about yourself  0  0 1 0  Trouble concentrating 2  0 1 0  Moving slowly or fidgety/restless 0  0 0 0  Suicidal thoughts 0  0 0 0  PHQ-9 Score 8  0  7  2   Difficult doing work/chores Somewhat difficult  Not difficult at all Somewhat difficult Somewhat difficult     Data saved with a previous flowsheet row definition    phq 9 is positive  Fall Risk:    04/29/2024    9:30 AM 03/05/2024    9:43 AM 01/02/2024   10:35 AM 11/21/2023    8:58 AM 03/14/2023    8:47 AM  Fall Risk   Falls in the past year? 0  0 0 0 1  Number falls in past yr: 0 0 0 0 0  Injury with Fall? 0 0  0  0  0   Risk for fall due to : No Fall Risks No Fall Risks No Fall Risks No Fall Risks History of fall(s)  Follow up Falls evaluation completed Falls evaluation completed Falls evaluation completed Falls evaluation completed Falls prevention discussed;Education provided;Falls evaluation completed     Data saved with a previous flowsheet row definition    Assessment & Plan Major depressive disorder, mild recurrent Recurrent symptoms worsened by citalopram  discontinuation, lack of support, and stressors. - Restart citalopram . - Recommended UV light for light therapy. - Encouraged therapy for support.  Hypertensive chronic kidney disease stage 3a Stage 3a CKD with hypertension. Elevated BP with mild leg swelling, possibly due to arthritis or fluid retention. - Increased diuretic dosage. - Continue Tribenzor but adjusting dose from 40/5/12.5 mg to 40/5/25 mg daily  and continue nadolol  40 mg daily . - Monitor blood pressure regularly.  Morbid obesity BMI over 35 with co-morbidities such as HTN . Engaged in Weight Watchers and line dancing. Prefers lifestyle modifications. - Continue Weight Watchers. - Encouraged regular physical activity, including line dancing and  walking.  Hyperglycemia Managed with metformin . - Continue metformin  500 mg twice daily. - Ordered A1c test.  Dyslipidemia Managed with atorvastatin . - Continue atorvastatin .  Perennial allergic rhinitis Managed with loratadine , montelukast , and Xelostine nasal spray as needed. - Continue loratadine  and montelukast  as needed. - Use Xelostine nasal spray as needed.  Vitamin B12 deficiency - Ordered B12 lab test.        [1]  Current Outpatient Medications:    acetaminophen  (TYLENOL ) 500 MG tablet, Take by mouth. Reported on 11/15/2015, Disp: , Rfl:    aspirin EC 81 MG tablet, Take 81 mg by mouth daily., Disp: , Rfl:    atorvastatin  (LIPITOR) 40 MG tablet, Take 1 tablet (40 mg total) by mouth every evening. for cholesterol, Disp: 90 tablet, Rfl: 1   Azelastine  HCl 137 MCG/SPRAY SOLN, PLACE 2 SPRAYS INTO BOTH NOSTRILS 2 (TWO) TIMES DAILY. USE IN EACH NOSTRIL AS DIRECTED, Disp: 90 mL, Rfl: 0   Cholecalciferol (VITAMIN D ) 50 MCG (2000 UT) CAPS, Take 1 capsule (2,000 Units total) by mouth daily., Disp: 30 capsule, Rfl: 0   citalopram  (CELEXA ) 20 MG tablet, TAKE 1 TABLET BY MOUTH EVERY DAY, Disp: 90 tablet, Rfl: 1   Cyanocobalamin  (B-12) 1000 MCG SUBL, Place 1 each under the tongue 2 (two) times a week. , Disp: , Rfl:    Fluticasone -Umeclidin-Vilant (TRELEGY ELLIPTA ) 100-62.5-25 MCG/ACT AEPB, Inhale 1 puff into the lungs daily., Disp: , Rfl:    glucose blood (ONETOUCH VERIO) test strip, Use as instructed, Disp: 100 each, Rfl: 12   loratadine  (CLARITIN ) 10 MG tablet, Take 1 tablet (10 mg total) by mouth daily., Disp: 90 tablet, Rfl: 1   metFORMIN  (GLUCOPHAGE ) 500 MG tablet, Take 1 tablet (500 mg total) by mouth 2 (two) times daily., Disp: 180 tablet, Rfl: 1   montelukast  (SINGULAIR ) 10 MG tablet, TAKE 1 TABLET BY MOUTH EVERYDAY AT BEDTIME, Disp: 90 tablet, Rfl: 2   nadolol  (CORGARD ) 40 MG tablet, Take 1 tablet (40 mg total) by mouth daily., Disp: 90 tablet, Rfl: 1    Olmesartan -amLODIPine -HCTZ 40-5-12.5 MG TABS, Take 1 tablet by mouth daily., Disp: 90 tablet, Rfl: 1   omeprazole  (PRILOSEC) 40 MG capsule, TAKE 1 CAPSULE (40 MG TOTAL) BY MOUTH DAILY., Disp: 90 capsule, Rfl: 3  triamcinolone  cream (KENALOG ) 0.1 %, Apply topically 2 (two) times daily., Disp: 45 g, Rfl: 0   Turmeric Curcumin 08-998 MG CAPS, Take 1,000 mg by mouth., Disp: , Rfl:    Phentermine -Topiramate  ER (QSYMIA ) 3.75-23 MG CP24, Take 1 capsule by mouth every morning. (Patient not taking: Reported on 04/29/2024), Disp: 30 capsule, Rfl: 0 [2]  Allergies Allergen Reactions   Duloxetine      sweat

## 2024-04-30 LAB — VITAMIN D 25 HYDROXY (VIT D DEFICIENCY, FRACTURES): Vit D, 25-Hydroxy: 50 ng/mL (ref 30–100)

## 2024-04-30 LAB — HEMOGLOBIN A1C
Hgb A1c MFr Bld: 5.8 % — ABNORMAL HIGH
Mean Plasma Glucose: 120 mg/dL
eAG (mmol/L): 6.6 mmol/L

## 2024-04-30 LAB — COMPREHENSIVE METABOLIC PANEL WITH GFR
AG Ratio: 1.4 (calc) (ref 1.0–2.5)
ALT: 12 U/L (ref 6–29)
AST: 18 U/L (ref 10–35)
Albumin: 4.2 g/dL (ref 3.6–5.1)
Alkaline phosphatase (APISO): 75 U/L (ref 37–153)
BUN/Creatinine Ratio: 16 (calc) (ref 6–22)
BUN: 18 mg/dL (ref 7–25)
CO2: 27 mmol/L (ref 20–32)
Calcium: 9.4 mg/dL (ref 8.6–10.4)
Chloride: 102 mmol/L (ref 98–110)
Creat: 1.11 mg/dL — ABNORMAL HIGH (ref 0.60–1.00)
Globulin: 2.9 g/dL (ref 1.9–3.7)
Glucose, Bld: 91 mg/dL (ref 65–99)
Potassium: 4.4 mmol/L (ref 3.5–5.3)
Sodium: 140 mmol/L (ref 135–146)
Total Bilirubin: 0.7 mg/dL (ref 0.2–1.2)
Total Protein: 7.1 g/dL (ref 6.1–8.1)
eGFR: 52 mL/min/1.73m2 — ABNORMAL LOW

## 2024-04-30 LAB — LIPID PANEL
Cholesterol: 121 mg/dL
HDL: 55 mg/dL
LDL Cholesterol (Calc): 47 mg/dL
Non-HDL Cholesterol (Calc): 66 mg/dL
Total CHOL/HDL Ratio: 2.2 (calc)
Triglycerides: 106 mg/dL

## 2024-04-30 LAB — CBC WITH DIFFERENTIAL/PLATELET
Absolute Lymphocytes: 1367 {cells}/uL (ref 850–3900)
Absolute Monocytes: 808 {cells}/uL (ref 200–950)
Basophils Absolute: 52 {cells}/uL (ref 0–200)
Basophils Relative: 0.6 %
Eosinophils Absolute: 284 {cells}/uL (ref 15–500)
Eosinophils Relative: 3.3 %
HCT: 37.6 % (ref 35.9–46.0)
Hemoglobin: 11.9 g/dL (ref 11.7–15.5)
MCH: 27.1 pg (ref 27.0–33.0)
MCHC: 31.6 g/dL (ref 31.6–35.4)
MCV: 85.6 fL (ref 81.4–101.7)
MPV: 10 fL (ref 7.5–12.5)
Monocytes Relative: 9.4 %
Neutro Abs: 6089 {cells}/uL (ref 1500–7800)
Neutrophils Relative %: 70.8 %
Platelets: 415 Thousand/uL — ABNORMAL HIGH (ref 140–400)
RBC: 4.39 Million/uL (ref 3.80–5.10)
RDW: 12.7 % (ref 11.0–15.0)
Total Lymphocyte: 15.9 %
WBC: 8.6 Thousand/uL (ref 3.8–10.8)

## 2024-04-30 LAB — B12 AND FOLATE PANEL
Folate: 6.7 ng/mL
Vitamin B-12: 768 pg/mL (ref 200–1100)

## 2024-04-30 LAB — TSH: TSH: 1.8 m[IU]/L (ref 0.40–4.50)

## 2024-05-02 ENCOUNTER — Ambulatory Visit: Payer: Self-pay | Admitting: Family Medicine

## 2024-05-06 NOTE — Telephone Encounter (Unsigned)
 Copied from CRM [id number]. Topic: Appointments - Scheduling Inquiry for Clinic >> May 06, 2024 11:20 AM Shanda MATSU wrote: Reason for CRM: Patient called in req to cancel and reschedule AWV scheduled for 05/10/2023, I was able to cancel appt, but system would not allow me to reschedule for a different date, can patient be called to get AWV rescheduled.

## 2024-05-09 ENCOUNTER — Encounter

## 2024-06-06 ENCOUNTER — Ambulatory Visit

## 2024-06-19 ENCOUNTER — Ambulatory Visit

## 2024-10-28 ENCOUNTER — Ambulatory Visit: Admitting: Family Medicine
